# Patient Record
Sex: Male | Born: 1935
Health system: Southern US, Community
[De-identification: ages and names within clinical notes are randomized; demographics above are authoritative.]

## PROBLEM LIST (undated history)

## (undated) DIAGNOSIS — J439 Emphysema, unspecified: Secondary | ICD-10-CM

## (undated) DIAGNOSIS — M1990 Unspecified osteoarthritis, unspecified site: Secondary | ICD-10-CM

## (undated) DIAGNOSIS — R0600 Dyspnea, unspecified: Secondary | ICD-10-CM

## (undated) DIAGNOSIS — C801 Malignant (primary) neoplasm, unspecified: Secondary | ICD-10-CM

## (undated) DIAGNOSIS — I2510 Atherosclerotic heart disease of native coronary artery without angina pectoris: Secondary | ICD-10-CM

## (undated) DIAGNOSIS — C4491 Basal cell carcinoma of skin, unspecified: Secondary | ICD-10-CM

## (undated) DIAGNOSIS — N189 Chronic kidney disease, unspecified: Secondary | ICD-10-CM

## (undated) DIAGNOSIS — F419 Anxiety disorder, unspecified: Secondary | ICD-10-CM

## (undated) HISTORY — PX: TONSILLECTOMY: SUR1361

## (undated) HISTORY — PX: CATARACT EXTRACTION W/ INTRAOCULAR LENS IMPLANT: SHX1309

## (undated) HISTORY — PX: BLADDER SURGERY: SHX569

---

## 1998-07-04 ENCOUNTER — Encounter: Payer: Self-pay | Admitting: Orthopedic Surgery

## 1998-07-04 ENCOUNTER — Inpatient Hospital Stay (HOSPITAL_COMMUNITY): Admission: EM | Admit: 1998-07-04 | Discharge: 1998-07-07 | Payer: Self-pay | Admitting: Family Medicine

## 1998-07-04 ENCOUNTER — Encounter: Payer: Self-pay | Admitting: Emergency Medicine

## 1998-07-16 ENCOUNTER — Encounter: Payer: Self-pay | Admitting: Orthopedic Surgery

## 1998-07-17 ENCOUNTER — Inpatient Hospital Stay (HOSPITAL_COMMUNITY): Admission: RE | Admit: 1998-07-17 | Discharge: 1998-07-19 | Payer: Self-pay | Admitting: Orthopedic Surgery

## 1998-07-17 ENCOUNTER — Encounter: Payer: Self-pay | Admitting: Orthopedic Surgery

## 2001-03-17 ENCOUNTER — Encounter: Payer: Self-pay | Admitting: Orthopaedic Surgery

## 2001-03-17 ENCOUNTER — Ambulatory Visit (HOSPITAL_COMMUNITY): Admission: RE | Admit: 2001-03-17 | Discharge: 2001-03-17 | Payer: Self-pay | Admitting: Orthopaedic Surgery

## 2003-06-07 ENCOUNTER — Ambulatory Visit: Admission: RE | Admit: 2003-06-07 | Discharge: 2003-06-07 | Payer: Self-pay | Admitting: Internal Medicine

## 2005-01-16 ENCOUNTER — Emergency Department (HOSPITAL_COMMUNITY): Admission: AD | Admit: 2005-01-16 | Discharge: 2005-01-16 | Payer: Self-pay | Admitting: Family Medicine

## 2006-09-14 ENCOUNTER — Ambulatory Visit: Payer: Self-pay | Admitting: Gastroenterology

## 2006-09-28 ENCOUNTER — Encounter: Payer: Self-pay | Admitting: Gastroenterology

## 2006-09-28 ENCOUNTER — Ambulatory Visit: Payer: Self-pay | Admitting: Gastroenterology

## 2007-11-11 ENCOUNTER — Emergency Department (HOSPITAL_COMMUNITY): Admission: EM | Admit: 2007-11-11 | Discharge: 2007-11-11 | Payer: Self-pay | Admitting: Emergency Medicine

## 2011-03-23 DIAGNOSIS — S51809A Unspecified open wound of unspecified forearm, initial encounter: Secondary | ICD-10-CM | POA: Diagnosis not present

## 2011-03-30 DIAGNOSIS — I10 Essential (primary) hypertension: Secondary | ICD-10-CM | POA: Diagnosis not present

## 2011-03-30 DIAGNOSIS — E785 Hyperlipidemia, unspecified: Secondary | ICD-10-CM | POA: Diagnosis not present

## 2011-03-30 DIAGNOSIS — Z125 Encounter for screening for malignant neoplasm of prostate: Secondary | ICD-10-CM | POA: Diagnosis not present

## 2011-04-06 DIAGNOSIS — D126 Benign neoplasm of colon, unspecified: Secondary | ICD-10-CM | POA: Diagnosis not present

## 2011-04-06 DIAGNOSIS — M1990 Unspecified osteoarthritis, unspecified site: Secondary | ICD-10-CM | POA: Diagnosis not present

## 2011-04-06 DIAGNOSIS — Z1212 Encounter for screening for malignant neoplasm of rectum: Secondary | ICD-10-CM | POA: Diagnosis not present

## 2011-04-06 DIAGNOSIS — Z0000 Encounter for general adult medical examination without abnormal findings: Secondary | ICD-10-CM | POA: Diagnosis not present

## 2011-04-06 DIAGNOSIS — K5730 Diverticulosis of large intestine without perforation or abscess without bleeding: Secondary | ICD-10-CM | POA: Diagnosis not present

## 2011-05-11 DIAGNOSIS — Z8551 Personal history of malignant neoplasm of bladder: Secondary | ICD-10-CM | POA: Diagnosis not present

## 2011-05-18 DIAGNOSIS — L570 Actinic keratosis: Secondary | ICD-10-CM | POA: Diagnosis not present

## 2011-05-18 DIAGNOSIS — Z85828 Personal history of other malignant neoplasm of skin: Secondary | ICD-10-CM | POA: Diagnosis not present

## 2011-06-15 DIAGNOSIS — H2510 Age-related nuclear cataract, unspecified eye: Secondary | ICD-10-CM | POA: Diagnosis not present

## 2011-06-15 DIAGNOSIS — H52209 Unspecified astigmatism, unspecified eye: Secondary | ICD-10-CM | POA: Diagnosis not present

## 2011-06-24 DIAGNOSIS — H2510 Age-related nuclear cataract, unspecified eye: Secondary | ICD-10-CM | POA: Diagnosis not present

## 2011-06-24 DIAGNOSIS — IMO0002 Reserved for concepts with insufficient information to code with codable children: Secondary | ICD-10-CM | POA: Diagnosis not present

## 2011-07-15 DIAGNOSIS — H2510 Age-related nuclear cataract, unspecified eye: Secondary | ICD-10-CM | POA: Diagnosis not present

## 2011-07-15 DIAGNOSIS — IMO0002 Reserved for concepts with insufficient information to code with codable children: Secondary | ICD-10-CM | POA: Diagnosis not present

## 2011-10-25 DIAGNOSIS — Z23 Encounter for immunization: Secondary | ICD-10-CM | POA: Diagnosis not present

## 2011-11-17 DIAGNOSIS — L570 Actinic keratosis: Secondary | ICD-10-CM | POA: Diagnosis not present

## 2011-11-17 DIAGNOSIS — D485 Neoplasm of uncertain behavior of skin: Secondary | ICD-10-CM | POA: Diagnosis not present

## 2012-03-31 DIAGNOSIS — E785 Hyperlipidemia, unspecified: Secondary | ICD-10-CM | POA: Diagnosis not present

## 2012-03-31 DIAGNOSIS — I10 Essential (primary) hypertension: Secondary | ICD-10-CM | POA: Diagnosis not present

## 2012-04-07 DIAGNOSIS — Z0000 Encounter for general adult medical examination without abnormal findings: Secondary | ICD-10-CM | POA: Diagnosis not present

## 2012-04-07 DIAGNOSIS — Z1331 Encounter for screening for depression: Secondary | ICD-10-CM | POA: Diagnosis not present

## 2012-04-07 DIAGNOSIS — J449 Chronic obstructive pulmonary disease, unspecified: Secondary | ICD-10-CM | POA: Diagnosis not present

## 2012-04-07 DIAGNOSIS — K5730 Diverticulosis of large intestine without perforation or abscess without bleeding: Secondary | ICD-10-CM | POA: Diagnosis not present

## 2012-04-12 DIAGNOSIS — Z1212 Encounter for screening for malignant neoplasm of rectum: Secondary | ICD-10-CM | POA: Diagnosis not present

## 2012-05-19 ENCOUNTER — Encounter: Payer: Self-pay | Admitting: Gastroenterology

## 2012-07-20 DIAGNOSIS — I10 Essential (primary) hypertension: Secondary | ICD-10-CM | POA: Diagnosis not present

## 2012-07-20 DIAGNOSIS — J449 Chronic obstructive pulmonary disease, unspecified: Secondary | ICD-10-CM | POA: Diagnosis not present

## 2012-07-20 DIAGNOSIS — Z6826 Body mass index (BMI) 26.0-26.9, adult: Secondary | ICD-10-CM | POA: Diagnosis not present

## 2012-07-20 DIAGNOSIS — R0609 Other forms of dyspnea: Secondary | ICD-10-CM | POA: Diagnosis not present

## 2012-07-20 DIAGNOSIS — R0989 Other specified symptoms and signs involving the circulatory and respiratory systems: Secondary | ICD-10-CM | POA: Diagnosis not present

## 2012-09-13 DIAGNOSIS — H35379 Puckering of macula, unspecified eye: Secondary | ICD-10-CM | POA: Diagnosis not present

## 2012-09-13 DIAGNOSIS — Z961 Presence of intraocular lens: Secondary | ICD-10-CM | POA: Diagnosis not present

## 2012-09-13 DIAGNOSIS — H52209 Unspecified astigmatism, unspecified eye: Secondary | ICD-10-CM | POA: Diagnosis not present

## 2012-10-11 DIAGNOSIS — Z23 Encounter for immunization: Secondary | ICD-10-CM | POA: Diagnosis not present

## 2012-10-20 DIAGNOSIS — R0609 Other forms of dyspnea: Secondary | ICD-10-CM | POA: Diagnosis not present

## 2012-10-20 DIAGNOSIS — Z6827 Body mass index (BMI) 27.0-27.9, adult: Secondary | ICD-10-CM | POA: Diagnosis not present

## 2012-10-20 DIAGNOSIS — I10 Essential (primary) hypertension: Secondary | ICD-10-CM | POA: Diagnosis not present

## 2012-11-20 DIAGNOSIS — Z85828 Personal history of other malignant neoplasm of skin: Secondary | ICD-10-CM | POA: Diagnosis not present

## 2012-11-20 DIAGNOSIS — L570 Actinic keratosis: Secondary | ICD-10-CM | POA: Diagnosis not present

## 2013-02-14 DIAGNOSIS — R0989 Other specified symptoms and signs involving the circulatory and respiratory systems: Secondary | ICD-10-CM | POA: Diagnosis not present

## 2013-02-14 DIAGNOSIS — J209 Acute bronchitis, unspecified: Secondary | ICD-10-CM | POA: Diagnosis not present

## 2013-02-14 DIAGNOSIS — R0609 Other forms of dyspnea: Secondary | ICD-10-CM | POA: Diagnosis not present

## 2013-04-04 DIAGNOSIS — E785 Hyperlipidemia, unspecified: Secondary | ICD-10-CM | POA: Diagnosis not present

## 2013-04-04 DIAGNOSIS — Z125 Encounter for screening for malignant neoplasm of prostate: Secondary | ICD-10-CM | POA: Diagnosis not present

## 2013-04-04 DIAGNOSIS — I10 Essential (primary) hypertension: Secondary | ICD-10-CM | POA: Diagnosis not present

## 2013-04-13 DIAGNOSIS — Z0000 Encounter for general adult medical examination without abnormal findings: Secondary | ICD-10-CM | POA: Diagnosis not present

## 2013-04-13 DIAGNOSIS — I10 Essential (primary) hypertension: Secondary | ICD-10-CM | POA: Diagnosis not present

## 2013-04-13 DIAGNOSIS — K5730 Diverticulosis of large intestine without perforation or abscess without bleeding: Secondary | ICD-10-CM | POA: Diagnosis not present

## 2013-04-13 DIAGNOSIS — J449 Chronic obstructive pulmonary disease, unspecified: Secondary | ICD-10-CM | POA: Diagnosis not present

## 2013-04-13 DIAGNOSIS — E785 Hyperlipidemia, unspecified: Secondary | ICD-10-CM | POA: Diagnosis not present

## 2013-04-13 DIAGNOSIS — J301 Allergic rhinitis due to pollen: Secondary | ICD-10-CM | POA: Diagnosis not present

## 2013-04-13 DIAGNOSIS — Z1331 Encounter for screening for depression: Secondary | ICD-10-CM | POA: Diagnosis not present

## 2013-04-13 DIAGNOSIS — M1990 Unspecified osteoarthritis, unspecified site: Secondary | ICD-10-CM | POA: Diagnosis not present

## 2013-04-13 DIAGNOSIS — D126 Benign neoplasm of colon, unspecified: Secondary | ICD-10-CM | POA: Diagnosis not present

## 2013-04-16 DIAGNOSIS — Z1212 Encounter for screening for malignant neoplasm of rectum: Secondary | ICD-10-CM | POA: Diagnosis not present

## 2013-09-17 DIAGNOSIS — Z961 Presence of intraocular lens: Secondary | ICD-10-CM | POA: Diagnosis not present

## 2013-09-17 DIAGNOSIS — H52209 Unspecified astigmatism, unspecified eye: Secondary | ICD-10-CM | POA: Diagnosis not present

## 2013-10-14 DIAGNOSIS — Z23 Encounter for immunization: Secondary | ICD-10-CM | POA: Diagnosis not present

## 2013-10-16 DIAGNOSIS — J449 Chronic obstructive pulmonary disease, unspecified: Secondary | ICD-10-CM | POA: Diagnosis not present

## 2013-10-16 DIAGNOSIS — Z1331 Encounter for screening for depression: Secondary | ICD-10-CM | POA: Diagnosis not present

## 2013-10-16 DIAGNOSIS — I10 Essential (primary) hypertension: Secondary | ICD-10-CM | POA: Diagnosis not present

## 2013-10-16 DIAGNOSIS — E785 Hyperlipidemia, unspecified: Secondary | ICD-10-CM | POA: Diagnosis not present

## 2013-10-16 DIAGNOSIS — J301 Allergic rhinitis due to pollen: Secondary | ICD-10-CM | POA: Diagnosis not present

## 2013-12-25 ENCOUNTER — Other Ambulatory Visit: Payer: Self-pay | Admitting: Dermatology

## 2013-12-25 DIAGNOSIS — C44319 Basal cell carcinoma of skin of other parts of face: Secondary | ICD-10-CM | POA: Diagnosis not present

## 2013-12-25 DIAGNOSIS — L570 Actinic keratosis: Secondary | ICD-10-CM | POA: Diagnosis not present

## 2014-01-30 ENCOUNTER — Encounter (HOSPITAL_COMMUNITY)
Admission: EM | Disposition: A | Discharge status: Home / Self Care | Payer: Self-pay | Source: Home / Self Care | Attending: Internal Medicine

## 2014-01-30 ENCOUNTER — Inpatient Hospital Stay (HOSPITAL_COMMUNITY): Payer: Medicare Other | Admitting: Registered Nurse

## 2014-01-30 ENCOUNTER — Inpatient Hospital Stay (HOSPITAL_COMMUNITY)
Admission: EM | Admit: 2014-01-30 | Discharge: 2014-01-31 | DRG: 378 | Disposition: A | Discharge status: Home / Self Care | Payer: Medicare Other | Attending: Internal Medicine | Admitting: Internal Medicine

## 2014-01-30 ENCOUNTER — Encounter (HOSPITAL_COMMUNITY): Payer: Self-pay | Admitting: Emergency Medicine

## 2014-01-30 DIAGNOSIS — K922 Gastrointestinal hemorrhage, unspecified: Secondary | ICD-10-CM

## 2014-01-30 DIAGNOSIS — E785 Hyperlipidemia, unspecified: Secondary | ICD-10-CM

## 2014-01-30 DIAGNOSIS — D649 Anemia, unspecified: Secondary | ICD-10-CM | POA: Diagnosis not present

## 2014-01-30 DIAGNOSIS — Z87891 Personal history of nicotine dependence: Secondary | ICD-10-CM | POA: Diagnosis not present

## 2014-01-30 DIAGNOSIS — I10 Essential (primary) hypertension: Secondary | ICD-10-CM | POA: Diagnosis present

## 2014-01-30 DIAGNOSIS — K921 Melena: Secondary | ICD-10-CM

## 2014-01-30 DIAGNOSIS — K2961 Other gastritis with bleeding: Secondary | ICD-10-CM

## 2014-01-30 DIAGNOSIS — N179 Acute kidney failure, unspecified: Secondary | ICD-10-CM | POA: Diagnosis present

## 2014-01-30 DIAGNOSIS — D62 Acute posthemorrhagic anemia: Secondary | ICD-10-CM | POA: Diagnosis present

## 2014-01-30 DIAGNOSIS — K260 Acute duodenal ulcer with hemorrhage: Secondary | ICD-10-CM

## 2014-01-30 DIAGNOSIS — Z7982 Long term (current) use of aspirin: Secondary | ICD-10-CM | POA: Diagnosis not present

## 2014-01-30 DIAGNOSIS — Z8551 Personal history of malignant neoplasm of bladder: Secondary | ICD-10-CM

## 2014-01-30 DIAGNOSIS — K2980 Duodenitis without bleeding: Secondary | ICD-10-CM | POA: Diagnosis not present

## 2014-01-30 DIAGNOSIS — D72829 Elevated white blood cell count, unspecified: Secondary | ICD-10-CM

## 2014-01-30 DIAGNOSIS — R109 Unspecified abdominal pain: Secondary | ICD-10-CM | POA: Diagnosis not present

## 2014-01-30 DIAGNOSIS — D6489 Other specified anemias: Secondary | ICD-10-CM

## 2014-01-30 HISTORY — DX: Gastrointestinal hemorrhage, unspecified: K92.2

## 2014-01-30 HISTORY — DX: Acute duodenal ulcer with hemorrhage: K26.0

## 2014-01-30 HISTORY — DX: Acute posthemorrhagic anemia: D62

## 2014-01-30 HISTORY — PX: ESOPHAGOGASTRODUODENOSCOPY: SHX5428

## 2014-01-30 HISTORY — DX: Emphysema, unspecified: J43.9

## 2014-01-30 HISTORY — DX: Malignant (primary) neoplasm, unspecified: C80.1

## 2014-01-30 LAB — CBC
HEMATOCRIT: 29.4 % — AB (ref 39.0–52.0)
Hemoglobin: 9.6 g/dL — ABNORMAL LOW (ref 13.0–17.0)
MCH: 30.2 pg (ref 26.0–34.0)
MCHC: 32.7 g/dL (ref 30.0–36.0)
MCV: 92.5 fL (ref 78.0–100.0)
PLATELETS: 257 10*3/uL (ref 150–400)
RBC: 3.18 MIL/uL — ABNORMAL LOW (ref 4.22–5.81)
RDW: 13.4 % (ref 11.5–15.5)
WBC: 13.8 10*3/uL — ABNORMAL HIGH (ref 4.0–10.5)

## 2014-01-30 LAB — COMPREHENSIVE METABOLIC PANEL
ALBUMIN: 3.9 g/dL (ref 3.5–5.2)
ALK PHOS: 27 U/L — AB (ref 39–117)
ALT: 14 U/L (ref 0–53)
AST: 22 U/L (ref 0–37)
Anion gap: 10 (ref 5–15)
BUN: 58 mg/dL — ABNORMAL HIGH (ref 6–23)
CALCIUM: 8.9 mg/dL (ref 8.4–10.5)
CO2: 22 mmol/L (ref 19–32)
CREATININE: 1.63 mg/dL — AB (ref 0.50–1.35)
Chloride: 105 mEq/L (ref 96–112)
GFR calc Af Amer: 43 mL/min — ABNORMAL LOW (ref >90)
GFR, EST NON AFRICAN AMERICAN: 37 mL/min — AB (ref >90)
Glucose, Bld: 131 mg/dL — ABNORMAL HIGH (ref 70–99)
Potassium: 4.1 mmol/L (ref 3.5–5.1)
Sodium: 137 mmol/L (ref 135–145)
Total Bilirubin: 0.6 mg/dL (ref 0.3–1.2)
Total Protein: 6.2 g/dL (ref 6.0–8.3)

## 2014-01-30 LAB — ABO/RH: ABO/RH(D): A POS

## 2014-01-30 LAB — I-STAT TROPONIN, ED: Troponin i, poc: 0 ng/mL (ref 0.00–0.08)

## 2014-01-30 LAB — I-STAT CG4 LACTIC ACID, ED: Lactic Acid, Venous: 2.5 mmol/L — ABNORMAL HIGH (ref 0.5–2.2)

## 2014-01-30 LAB — POC OCCULT BLOOD, ED: Fecal Occult Bld: POSITIVE — AB

## 2014-01-30 SURGERY — EGD (ESOPHAGOGASTRODUODENOSCOPY)
Anesthesia: Moderate Sedation

## 2014-01-30 SURGERY — ESOPHAGOGASTRODUODENOSCOPY (EGD) WITH PROPOFOL
Anesthesia: Monitor Anesthesia Care

## 2014-01-30 MED ORDER — MIDAZOLAM HCL 10 MG/2ML IJ SOLN
INTRAMUSCULAR | Status: DC | PRN
  Administered 2014-01-30 (×2): 2 mg via INTRAVENOUS
  Administered 2014-01-30: 1 mg via INTRAVENOUS

## 2014-01-30 MED ORDER — ACETAMINOPHEN 325 MG PO TABS: 650.0000 mg | ORAL_TABLET | Freq: Four times a day (QID) | ORAL | Status: DC | PRN

## 2014-01-30 MED ORDER — ADULT MULTIVITAMIN W/MINERALS CH
1.0000 | ORAL_TABLET | Freq: Every day | ORAL | Status: DC
  Administered 2014-01-31: 1 via ORAL
  Filled 2014-01-30 (×2): qty 1

## 2014-01-30 MED ORDER — ACETAMINOPHEN 650 MG RE SUPP: 650.0000 mg | Freq: Four times a day (QID) | RECTAL | Status: DC | PRN

## 2014-01-30 MED ORDER — AMLODIPINE BESYLATE 5 MG PO TABS
5.0000 mg | ORAL_TABLET | Freq: Every day | ORAL | Status: DC
Start: 2014-01-31 — End: 2014-01-31
  Administered 2014-01-31: 5 mg via ORAL
  Filled 2014-01-30 (×2): qty 1

## 2014-01-30 MED ORDER — ATORVASTATIN CALCIUM 20 MG PO TABS
20.0000 mg | ORAL_TABLET | Freq: Every day | ORAL | Status: DC
  Administered 2014-01-30: 20 mg via ORAL
  Filled 2014-01-30 (×2): qty 1

## 2014-01-30 MED ORDER — ALBUTEROL SULFATE (2.5 MG/3ML) 0.083% IN NEBU
3.0000 mL | INHALATION_SOLUTION | Freq: Four times a day (QID) | RESPIRATORY_TRACT | Status: DC | PRN
Start: 2014-01-30 — End: 2014-01-31

## 2014-01-30 MED ORDER — SODIUM CHLORIDE 0.9 % IJ SOLN: 3.0000 mL | Freq: Two times a day (BID) | INTRAMUSCULAR | Status: DC

## 2014-01-30 MED ORDER — PROPOFOL 10 MG/ML IV BOLUS
INTRAVENOUS | Status: AC
  Filled 2014-01-30: qty 20

## 2014-01-30 MED ORDER — FENTANYL CITRATE 0.05 MG/ML IJ SOLN
INTRAMUSCULAR | Status: DC | PRN
  Administered 2014-01-30: 12.5 ug via INTRAVENOUS
  Administered 2014-01-30 (×2): 25 ug via INTRAVENOUS

## 2014-01-30 MED ORDER — ONDANSETRON HCL 4 MG PO TABS: 4.0000 mg | ORAL_TABLET | Freq: Four times a day (QID) | ORAL | Status: DC | PRN

## 2014-01-30 MED ORDER — ONDANSETRON HCL 4 MG/2ML IJ SOLN: 4.0000 mg | Freq: Four times a day (QID) | INTRAMUSCULAR | Status: DC | PRN

## 2014-01-30 MED ORDER — SODIUM CHLORIDE 0.9 % IV SOLN
INTRAVENOUS | Status: DC
  Administered 2014-01-30: 18:00:00 via INTRAVENOUS

## 2014-01-30 MED ORDER — SIMVASTATIN 40 MG PO TABS
40.0000 mg | ORAL_TABLET | Freq: Every day | ORAL | Status: DC
  Filled 2014-01-30: qty 1

## 2014-01-30 MED ORDER — BUTAMBEN-TETRACAINE-BENZOCAINE 2-2-14 % EX AERO
INHALATION_SPRAY | CUTANEOUS | Status: DC | PRN
  Administered 2014-01-30: 2 via TOPICAL

## 2014-01-30 MED ORDER — LACTATED RINGERS IV SOLN
INTRAVENOUS | Status: DC
  Administered 2014-01-30: 1000 mL via INTRAVENOUS

## 2014-01-30 MED ORDER — LORATADINE 10 MG PO TABS
10.0000 mg | ORAL_TABLET | Freq: Every day | ORAL | Status: DC
  Administered 2014-01-31: 10 mg via ORAL
  Filled 2014-01-30: qty 1

## 2014-01-30 MED ORDER — PANTOPRAZOLE SODIUM 40 MG IV SOLR
40.0000 mg | Freq: Two times a day (BID) | INTRAVENOUS | Status: DC
  Administered 2014-01-30 – 2014-01-31 (×3): 40 mg via INTRAVENOUS
  Filled 2014-01-30 (×4): qty 40

## 2014-01-30 MED ORDER — MIDAZOLAM HCL 10 MG/2ML IJ SOLN
INTRAMUSCULAR | Status: AC
  Filled 2014-01-30: qty 2

## 2014-01-30 MED ORDER — DIPHENHYDRAMINE HCL 50 MG/ML IJ SOLN
INTRAMUSCULAR | Status: AC
  Filled 2014-01-30: qty 1

## 2014-01-30 MED ORDER — FENTANYL CITRATE 0.05 MG/ML IJ SOLN
INTRAMUSCULAR | Status: AC
  Filled 2014-01-30: qty 2

## 2014-01-30 SURGICAL SUPPLY — 15 items

## 2014-01-30 NOTE — Consult Note (Signed)
Referring Provider: Dr. Maryan Rued Primary Care Physician:  Tivis Ringer, MD Primary Gastroenterologist:  Dr. Fuller Plan   Reason for Consultation:  UGIB  HPI: Gerald Valdez is a 80 y.o. male who has PMH of HTN, HLD, bladder cancer.  He presented to [facility] hospital this morning with complaints of black, tarry stools that began the day prior to hospital visit.  He says that he noticed the black stools yesterday, had 3 or 4 BM's.  Denies any abdominal pain, nausea, or vomiting.  No dizziness but just feels tired.  Takes baby ASA daily but no other OTC NSAID's or anticoagulation.  Never had similar symptoms in the past.  In the ED he was found to have a Hgb of 9.6 grams with no previous labs for comparison.  EDP reports very black, strongly hemoccult positive stools on DRE.  Cr slightly elevated at 1.63.  WBC count slightly elevated as well at 13.8.  Protonix 40 mg IV BID has been ordered.    Last colonoscopy was in 09/2006 by Dr. Fuller Plan at which time he was found to have diverticulosis, internal hemorrhoids, a non-bleeding cecal AVM, and one polyp that was removed and was a tubular adenoma on pathology.   Past Medical History  Diagnosis Date  . Emphysema of lung   . Cancer     bladder    Past Surgical History  Procedure Laterality Date  . Bladder surgery      Prior to Admission medications   Medication Sig Start Date End Date Taking? Authorizing Provider  albuterol (PROVENTIL HFA;VENTOLIN HFA) 108 (90 BASE) MCG/ACT inhaler Inhale 2 puffs into the lungs every 6 (six) hours as needed for wheezing or shortness of breath.   Yes Historical Provider, MD  amLODipine (NORVASC) 5 MG tablet Take 5 mg by mouth daily with breakfast.   Yes Historical Provider, MD  aspirin EC 81 MG tablet Take 81 mg by mouth at bedtime.   Yes Historical Provider, MD  loratadine (CLARITIN) 10 MG tablet Take 10 mg by mouth daily.   Yes Historical Provider, MD  Multiple Vitamin (MULTIVITAMIN WITH MINERALS) TABS tablet Take 1  tablet by mouth daily with breakfast.   Yes Historical Provider, MD  ramipril (ALTACE) 10 MG capsule Take 10 mg by mouth daily with breakfast.   Yes Historical Provider, MD  simvastatin (ZOCOR) 40 MG tablet Take 40 mg by mouth at bedtime.   Yes Historical Provider, MD    No current facility-administered medications for this encounter.   Current Outpatient Prescriptions  Medication Sig Dispense Refill  . albuterol (PROVENTIL HFA;VENTOLIN HFA) 108 (90 BASE) MCG/ACT inhaler Inhale 2 puffs into the lungs every 6 (six) hours as needed for wheezing or shortness of breath.    Marland Kitchen amLODipine (NORVASC) 5 MG tablet Take 5 mg by mouth daily with breakfast.    . aspirin EC 81 MG tablet Take 81 mg by mouth at bedtime.    Marland Kitchen loratadine (CLARITIN) 10 MG tablet Take 10 mg by mouth daily.    . Multiple Vitamin (MULTIVITAMIN WITH MINERALS) TABS tablet Take 1 tablet by mouth daily with breakfast.    . ramipril (ALTACE) 10 MG capsule Take 10 mg by mouth daily with breakfast.    . simvastatin (ZOCOR) 40 MG tablet Take 40 mg by mouth at bedtime.      Allergies as of 01/30/2014  . (No Known Allergies)    No family history on file.  History   Social History  . Marital Status: Married  Spouse Name: N/A    Number of Children: N/A  . Years of Education: N/A   Occupational History  . Not on file.   Social History Main Topics  . Smoking status: Former Research scientist (life sciences)  . Smokeless tobacco: Not on file  . Alcohol Use: Not on file  . Drug Use: Not on file  . Sexual Activity: Not on file   Other Topics Concern  . Not on file   Social History Narrative  . No narrative on file    Review of Systems: Ten point ROS is O/W negative except as mentioned in HPI.  Physical Exam: Vital signs in last 24 hours: Temp:  [97.6 F (36.4 C)] 97.6 F (36.4 C) (12/23 1022) Pulse Rate:  [94-109] 94 (12/23 1142) Resp:  [20] 20 (12/23 1142) BP: (121-137)/(51) 121/51 mmHg (12/23 1142) SpO2:  [98 %-100 %] 98 % (12/23  1142)   General:  Alert,  Well-developed, well-nourished, pleasant and cooperative in NAD Head:  Normocephalic and atraumatic. Eyes:  Sclera clear, no icterus.  Conjunctiva pink. Ears:  Normal auditory acuity. Mouth:  No deformity or lesions.   Lungs:  Clear throughout to auscultation.  No wheezes, crackles, or rhonchi.  Heart:  Regular rate and rhythm; no murmurs, clicks, rubs, or gallops. Abdomen:  Soft, non-distended.  BS present.  Non-tender.   Rectal:  Deferred.  Had black, strongly hemoccult positive stools per EDP.   Msk:  Symmetrical without gross deformities. Pulses:  Normal pulses noted. Extremities:  Without clubbing or edema. Neurologic:  Alert and  oriented x4;  grossly normal neurologically. Skin:  Intact without significant lesions or rashes. Psych:  Alert and cooperative. Normal mood and affect.  Lab Results:  Recent Labs  01/30/14 1038  WBC 13.8*  HGB 9.6*  HCT 29.4*  PLT 257   BMET  Recent Labs  01/30/14 1038  NA 137  K 4.1  CL 105  CO2 22  GLUCOSE 131*  BUN 58*  CREATININE 1.63*  CALCIUM 8.9   LFT  Recent Labs  01/30/14 1038  PROT 6.2  ALBUMIN 3.9  AST 22  ALT 14  ALKPHOS 27*  BILITOT 0.6   IMPRESSION:  -Melena/black, heme positive stools:  Rule out UGI bleeding.  Has history of non-bleeding cecal AVM on colonoscopy in 2008.  PLAN: -EGD later today. -Protonix 40 mg IV BID. -If EGD is negative then may need repeat colonoscopy. -Monitor Hgb and transfuse if needed.  ZEHR, JESSICA D.  01/30/2014, 11:44 AM  Pager number [phone]  GI Attending Note   Chart was reviewed and patient was examined. X-rays and lab were reviewed.    I agree with management and plans. Suspect active PUD >> UGI neoplasm, AVMs.  Plans as above.  Sandy Salaam. Deatra Ina, M.D., [facility] Cell [phone]

## 2014-01-30 NOTE — Op Note (Signed)
[facility] [place], [address]   ENDOSCOPY PROCEDURE REPORT  PATIENT: Gerald Valdez, Gerald Valdez  MR#: [id number] BIRTHDATE: 1928/10/06 , 85  yrs. old GENDER: male ENDOSCOPIST: Inda Castle, MD REFERRED BY:  Prince Solian, M.D. PROCEDURE DATE:  01/30/2014 PROCEDURE:  EGD w/ biopsy ASA CLASS:     Class II INDICATIONS:  melena. MEDICATIONS: Versed 5 mg IV and Fentanyl 62.5 mcg IV TOPICAL ANESTHETIC: Cetacaine Spray  DESCRIPTION OF PROCEDURE: After the risks benefits and alternatives of the procedure were thoroughly explained, informed consent was obtained.  The Pentax Gastroscope [id number] endoscope was introduced through the mouth and advanced to the second portion of the duodenum , Without limitations.  The instrument was slowly withdrawn as the mucosa was fully examined.    DUODENUM: Non-bleeding ulcer(s) were found.   Except for the findings listed the EGD was otherwise normal. At the apex of the bulb there was marked inflammatory changes including edematous folds and erythematous folds.  There were at least 2 superficial linear ulcers measuring approximate 1 cm with a clean base. Biopsies surrounding the ulcer were taken (2) there was no fresh or old blood in the upper GI tract. Retroflexed views revealed no abnormalities.     The scope was then withdrawn from the patient and the procedure completed.  COMPLICATIONS: There were no immediate complications.  ENDOSCOPIC IMPRESSION: 1.   Ulcer(s) were found 2.   EGD was otherwise normal  RECOMMENDATIONS: twice a day PPI therapy Avoid NSAIDs  REPEAT EXAM:  eSigned:  Inda Castle, MD 01/30/2014 5:03 PM    CC:

## 2014-01-30 NOTE — Anesthesia Preprocedure Evaluation (Signed)
Anesthesia Evaluation  Patient identified by MRN, date of birth, ID band Patient awake    Reviewed: Allergy & Precautions, H&P , NPO status , Patient's Chart, lab work & pertinent test results  Airway Mallampati: II  TM Distance: >3 FB Neck ROM: full    Dental no notable dental hx.    Pulmonary neg pulmonary ROS, COPD COPD inhaler, former smoker,  breath sounds clear to auscultation  Pulmonary exam normal       Cardiovascular Exercise Tolerance: Good negative cardio ROS  Rhythm:regular Rate:Normal     Neuro/Psych negative neurological ROS  negative psych ROS   GI/Hepatic negative GI ROS, Neg liver ROS,   Endo/Other  negative endocrine ROS  Renal/GU negative Renal ROS  negative genitourinary   Musculoskeletal   Abdominal   Peds  Hematology negative hematology ROS (+)   Anesthesia Other Findings   Reproductive/Obstetrics negative OB ROS                             Anesthesia Physical Anesthesia Plan  ASA: III  Anesthesia Plan: MAC   Post-op Pain Management:    Induction:   Airway Management Planned:   Additional Equipment:   Intra-op Plan:   Post-operative Plan:   Informed Consent: I have reviewed the patients History and Physical, chart, labs and discussed the procedure including the risks, benefits and alternatives for the proposed anesthesia with the patient or authorized representative who has indicated his/her understanding and acceptance.   Dental Advisory Given  Plan Discussed with: CRNA and Surgeon  Anesthesia Plan Comments:         Anesthesia Quick Evaluation

## 2014-01-30 NOTE — Progress Notes (Signed)
The order for simvastatin(Zocor) was changed to an equivalent dose of atorvastatin(Lipitor) due to the potential drug interaction with Norvasc.  When taken in combination with medications that inhibit its metabolism, simvastatin can accumulate which increases the risk of liver toxicity, myopathy, or rhabdomyolysis.  Simvastatin dose should not exceed 10mg /day in patients taking verapamil, diltiazem, fibrates, or niacin >or= 1g/day.   Simvastatin dose should not exceed 20mg /day in patients taking amlodipine, ranolazine or amiodarone.   Please consider this potential interaction at discharge.  Dorrene German 01/30/2014 7:01 PM

## 2014-01-30 NOTE — Progress Notes (Signed)
See EGD note.  He has several ulcers in the duodenum.  There is no fresh or old blood.  Recommendations #1 twice a day PPI therapy #2 regular diet  If stable can be discharged tomorrow

## 2014-01-30 NOTE — ED Provider Notes (Signed)
CSN: [id number]     Arrival date & time 01/30/14  1007 History   First MD Initiated Contact with Patient 01/30/14 1018     Chief Complaint  Patient presents with  . Melena  . Abdominal Pain  . Fatigue     (Consider location/radiation/quality/duration/timing/severity/associated sxs/prior Treatment) Patient is a 80 y.o. male presenting with hematochezia. The history is provided by the patient and the spouse.  Rectal Bleeding Quality:  Black and tarry Amount:  Moderate Duration:  2 days Timing:  Intermittent Progression:  Unchanged Chronicity:  New Context: defecation   Context: not rectal pain   Similar prior episodes: no   Relieved by:  Nothing Worsened by:  Nothing tried Ineffective treatments:  None tried Associated symptoms: no dizziness, no fever, no light-headedness, no loss of consciousness and no vomiting   Associated symptoms comment:  States abdomen is a little sore but no acute pain.  Fatigue but no SOB or chest pain.  Decreased appetite. Risk factors: hx of colorectal surgery   Risk factors: no anticoagulant use, no hx of colorectal cancer, no liver disease, no NSAID use and no steroid use     Past Medical History  Diagnosis Date  . Emphysema of lung   . Cancer     bladder   Past Surgical History  Procedure Laterality Date  . Bladder surgery     No family history on file. History  Substance Use Topics  . Smoking status: Former Research scientist (life sciences)  . Smokeless tobacco: Not on file  . Alcohol Use: Not on file    Review of Systems  Constitutional: Positive for appetite change and fatigue. Negative for fever.  Respiratory: Negative for cough and chest tightness.        Chronic mild SOB from COPD but nothing different from baseline  Cardiovascular: Negative for chest pain and leg swelling.  Gastrointestinal: Positive for hematochezia. Negative for vomiting.  Genitourinary: Negative for dysuria.  Neurological: Negative for dizziness, loss of consciousness and  light-headedness.  All other systems reviewed and are negative.     Allergies  Review of patient's allergies indicates no known allergies.  Home Medications   Prior to Admission medications   Medication Sig Start Date End Date Taking? Authorizing Provider  albuterol (PROVENTIL HFA;VENTOLIN HFA) 108 (90 BASE) MCG/ACT inhaler Inhale 2 puffs into the lungs every 6 (six) hours as needed for wheezing or shortness of breath.   Yes Historical Provider, MD  amLODipine (NORVASC) 5 MG tablet Take 5 mg by mouth daily with breakfast.   Yes Historical Provider, MD  aspirin EC 81 MG tablet Take 81 mg by mouth at bedtime.   Yes Historical Provider, MD  loratadine (CLARITIN) 10 MG tablet Take 10 mg by mouth daily.   Yes Historical Provider, MD  Multiple Vitamin (MULTIVITAMIN WITH MINERALS) TABS tablet Take 1 tablet by mouth daily with breakfast.   Yes Historical Provider, MD  ramipril (ALTACE) 10 MG capsule Take 10 mg by mouth daily with breakfast.   Yes Historical Provider, MD  simvastatin (ZOCOR) 40 MG tablet Take 40 mg by mouth at bedtime.   Yes Historical Provider, MD   BP 137/51 mmHg  Pulse 109  Temp(Src) 97.6 F (36.4 C) (Oral)  Resp 20  SpO2 100% Physical Exam  Constitutional: He is oriented to person, place, and time. He appears well-developed and well-nourished. No distress.  HENT:  Head: Normocephalic and atraumatic.  Mouth/Throat: Oropharynx is clear and moist.  Eyes: Conjunctivae and EOM are normal. Pupils are equal, round,  and reactive to light.  Neck: Normal range of motion. Neck supple.  Cardiovascular: Regular rhythm and intact distal pulses.  Tachycardia present.   No murmur heard. Pulmonary/Chest: Effort normal and breath sounds normal. No respiratory distress. He has no wheezes. He has no rales.  Abdominal: Soft. He exhibits no distension. There is no tenderness. There is no rebound and no guarding.  Genitourinary: Guaiac positive stool.  Black tarry stool  Musculoskeletal:  Normal range of motion. He exhibits no edema or tenderness.  Neurological: He is alert and oriented to person, place, and time.  Skin: Skin is warm and dry. No rash noted. No erythema. There is pallor.  Psychiatric: He has a normal mood and affect. His behavior is normal.  Nursing note and vitals reviewed.   ED Course  Procedures (including critical care time) Labs Review Labs Reviewed  CBC - Abnormal; Notable for the following:    WBC 13.8 (*)    RBC 3.18 (*)    Hemoglobin 9.6 (*)    HCT 29.4 (*)    All other components within normal limits  POC OCCULT BLOOD, ED - Abnormal; Notable for the following:    Fecal Occult Bld POSITIVE (*)    All other components within normal limits  I-STAT CG4 LACTIC ACID, ED - Abnormal; Notable for the following:    Lactic Acid, Venous 2.50 (*)    All other components within normal limits  COMPREHENSIVE METABOLIC PANEL  OCCULT BLOOD X 1 CARD TO LAB, STOOL  I-STAT TROPOININ, ED  TYPE AND SCREEN  ABO/RH    Imaging Review No results found.   EKG Interpretation   Date/Time:  Wednesday January 30 2014 10:25:07 EST Ventricular Rate:  99 PR Interval:  154 QRS Duration: 77 QT Interval:  340 QTC Calculation: 436 R Axis:   76 Text Interpretation:  Sinus rhythm Repol abnrm suggests ischemia,  anterolateral , new ST depression in inferolateral leads Confirmed by  Maryan Rued  MD, Loree Fee ([id number]) on 01/30/2014 10:41:32 AM      MDM   Final diagnoses:  Gastrointestinal hemorrhage, unspecified gastritis, unspecified gastrointestinal hemorrhage type  Anemia due to other cause    Patient with 48 hours of blacked tarry stool, general fatigue and decreased appetite. He denies any shortness of breath or chest pain. He denies any past history of GI bleeding. He takes it the baby aspirin a day. No NSAIDs or alcohol. Patient has no abdominal pain on exam. He is mildly tachycardic with ST depression and a normal troponin. Initial hemoglobin was 9.6.  Patient's last colonoscopy was less than 5 years ago and at that time he had a small polyp that was removed but no other findings.  Patient is currently stable with mild tachycardia but normal blood pressure. He is talking awake and alert. Patient was typed and screened.  Hemoccult was positive and lactic acid is 2.5. CMP is pending. We'll discuss with [facility] and hospitalist for admission    Blanchie Dessert, MD 01/30/14 2035

## 2014-01-30 NOTE — ED Notes (Addendum)
Pt reports formed tarry stool starting yesterday. Pt denies nausea, vomiting, or diarrhea.

## 2014-01-30 NOTE — ED Notes (Signed)
Pt c/o dark tarry stool that he noticed yesterday, pt states abd pain started this morning and has not eaten anything this morning. Pt also c/o feeling fatigue, denies n/v/d.

## 2014-01-30 NOTE — H&P (Signed)
[facility] History and Physical  Gerald Valdez OEV:[id number] DOB: 04/17/1928 DOA: 01/30/2014  Referring physician: ER physician PCP: Tivis Ringer, MD   Chief Complaint: black stool   HPI:  80 year old male with past medical history of hypertension, dyslipidemia who presented to [facility] ED with reports of black Perry stool for past 2 days prior to this admission. Patient did not report the diarrhea or constipation area to no abdominal pain, nausea or vomiting. Patient does not recall previous similar events. He states aspirin 1 daily basis. No other complaints such as chest pain, shortness of breath or palpitations. No fevers or chills. No lightheadedness or loss of consciousness. In ED, patient is hemodynamically stable with blood pressure 121/51, heart rate 94, afebrile and oxygen saturation 98% on room air. Blood work revealed white blood cell count of 13.8, hemoglobin 9.6, creatinine is 1.63 on admission. Fecal occult blood test was positive. Patient was admitted for further evaluation. The GI will see the patient in consultation.   Assessment & Plan    Active Problems: Upper GI bleed  Triggering agent likely aspirin. Aspirin was put on hold. Unclear etiology of bleeding.  Patient started on IV fluids, Protonix 40 mg IV every 12 hours.  Use antiemetics as needed for nausea or vomiting.  Appreciate the GI seen the patient in consultation.  Essential hypertension  Will be according may resume Norvasc 5 mg daily but hold ramipril because of renal insufficiency.  Acute renal failure  Likely due to ACE inhibitor, ramipril. Ramipril put on hold.  Continue IV fluids started in ED. Follow-up BMP in the morning.  Dyslipidemia  May resume Zocor 40 mg daily  DVT prophylaxis:   SCD's bilaterally   Radiological Exams on Admission: No results found.   Code Status: Full Family Communication: Plan of care discussed with the patient  Disposition Plan: Admit for  further evaluation  Leisa Lenz, MD  [facility] Pager [phone]  Review of Systems:  Constitutional: Negative for fever, chills and malaise/fatigue. Negative for diaphoresis.  HENT: Negative for hearing loss, ear pain, nosebleeds, congestion, sore throat, neck pain, tinnitus and ear discharge.   Eyes: Negative for blurred vision, double vision, photophobia, pain, discharge and redness.  Respiratory: Negative for cough, hemoptysis, sputum production, shortness of breath, wheezing and stridor.   Cardiovascular: Negative for chest pain, palpitations, orthopnea, claudication and leg swelling.  Gastrointestinal: per HPI  Genitourinary: Negative for dysuria, urgency, frequency, hematuria and flank pain.  Musculoskeletal: Negative for myalgias, back pain, joint pain and falls.  Skin: Negative for itching and rash.  Neurological: Negative for dizziness and weakness. Negative for tingling, tremors, sensory change, speech change, focal weakness, loss of consciousness and headaches.  Endo/Heme/Allergies: Negative for environmental allergies and polydipsia. Does not bruise/bleed easily.  Psychiatric/Behavioral: Negative for suicidal ideas. The patient is not nervous/anxious.      Past Medical History  Diagnosis Date  . Emphysema of lung   . Cancer     bladder   Past Surgical History  Procedure Laterality Date  . Bladder surgery     Social History:  reports that he has quit smoking. He does not have any smokeless tobacco history on file. His alcohol and drug histories are not on file.  No Known Allergies  Family History: No family history on file.   Prior to Admission medications   Medication Sig Start Date End Date Taking? Authorizing Provider  albuterol (PROVENTIL HFA;VENTOLIN HFA) 108 (90 BASE) MCG/ACT inhaler Inhale 2 puffs into the lungs every 6 (six)  hours as needed for wheezing or shortness of breath.   Yes Historical Provider, MD  amLODipine (NORVASC) 5 MG tablet Take 5 mg  by mouth daily with breakfast.   Yes Historical Provider, MD  aspirin EC 81 MG tablet Take 81 mg by mouth at bedtime.   Yes Historical Provider, MD  loratadine (CLARITIN) 10 MG tablet Take 10 mg by mouth daily.   Yes Historical Provider, MD  Multiple Vitamin (MULTIVITAMIN WITH MINERALS) TABS tablet Take 1 tablet by mouth daily with breakfast.   Yes Historical Provider, MD  ramipril (ALTACE) 10 MG capsule Take 10 mg by mouth daily with breakfast.   Yes Historical Provider, MD  simvastatin (ZOCOR) 40 MG tablet Take 40 mg by mouth at bedtime.   Yes Historical Provider, MD   Physical Exam: Filed Vitals:   01/30/14 1022 01/30/14 1142  BP: 137/51 121/51  Pulse: 109 94  Temp: 97.6 F (36.4 C)   TempSrc: Oral   Resp: 20 20  SpO2: 100% 98%    Physical Exam  Constitutional: Appears well-developed and well-nourished. No distress.  HENT: Normocephalic. No tonsillar erythema or exudates Eyes: Conjunctivae and EOM are normal. PERRLA, no scleral icterus.  Neck: Normal ROM. Neck supple. No JVD. No tracheal deviation. No thyromegaly.  CVS: RRR, S1/S2 +, no murmurs, no gallops, no carotid bruit.  Pulmonary: Effort and breath sounds normal, no stridor, rhonchi, wheezes, rales.  Abdominal: Soft. BS +,  no distension, tenderness, rebound or guarding.  Musculoskeletal: Normal range of motion. No edema and no tenderness.  Lymphadenopathy: No lymphadenopathy noted, cervical, inguinal. Neuro: Alert. Normal reflexes, muscle tone coordination. No focal neurologic deficits. Skin: Skin is warm and dry. No rash noted.  No erythema. No pallor.  Psychiatric: Normal mood and affect. Behavior, judgment, thought content normal.   Labs on Admission:  Basic Metabolic Panel:  Recent Labs Lab 01/30/14 1038  NA 137  K 4.1  CL 105  CO2 22  GLUCOSE 131*  BUN 58*  CREATININE 1.63*  CALCIUM 8.9   Liver Function Tests:  Recent Labs Lab 01/30/14 1038  AST 22  ALT 14  ALKPHOS 27*  BILITOT 0.6  PROT 6.2   ALBUMIN 3.9   No results for input(s): LIPASE, AMYLASE in the last 168 hours. No results for input(s): AMMONIA in the last 168 hours. CBC:  Recent Labs Lab 01/30/14 1038  WBC 13.8*  HGB 9.6*  HCT 29.4*  MCV 92.5  PLT 257   Cardiac Enzymes: No results for input(s): CKTOTAL, CKMB, CKMBINDEX, TROPONINI in the last 168 hours. BNP: Invalid input(s): POCBNP CBG: No results for input(s): GLUCAP in the last 168 hours.  If 7PM-7AM, please contact night-coverage [email or web address] Password [facility] 01/30/2014, 11:55 AM

## 2014-01-30 NOTE — ED Notes (Signed)
Pt transferred to ENDO 

## 2014-01-31 ENCOUNTER — Encounter (HOSPITAL_COMMUNITY): Payer: Self-pay | Admitting: Gastroenterology

## 2014-01-31 LAB — COMPREHENSIVE METABOLIC PANEL
ALBUMIN: 3.3 g/dL — AB (ref 3.5–5.2)
ALT: 14 U/L (ref 0–53)
AST: 18 U/L (ref 0–37)
Alkaline Phosphatase: 27 U/L — ABNORMAL LOW (ref 39–117)
Anion gap: 2 — ABNORMAL LOW (ref 5–15)
BUN: 41 mg/dL — ABNORMAL HIGH (ref 6–23)
CO2: 28 mmol/L (ref 19–32)
CREATININE: 1.27 mg/dL (ref 0.50–1.35)
Calcium: 8.8 mg/dL (ref 8.4–10.5)
Chloride: 109 mEq/L (ref 96–112)
GFR calc Af Amer: 59 mL/min — ABNORMAL LOW (ref >90)
GFR, EST NON AFRICAN AMERICAN: 50 mL/min — AB (ref >90)
Glucose, Bld: 107 mg/dL — ABNORMAL HIGH (ref 70–99)
Potassium: 4.8 mmol/L (ref 3.5–5.1)
Sodium: 139 mmol/L (ref 135–145)
TOTAL PROTEIN: 5.3 g/dL — AB (ref 6.0–8.3)
Total Bilirubin: 0.2 mg/dL — ABNORMAL LOW (ref 0.3–1.2)

## 2014-01-31 LAB — CBC
HEMATOCRIT: 24.4 % — AB (ref 39.0–52.0)
Hemoglobin: 8.2 g/dL — ABNORMAL LOW (ref 13.0–17.0)
MCH: 30.7 pg (ref 26.0–34.0)
MCHC: 33.6 g/dL (ref 30.0–36.0)
MCV: 91.4 fL (ref 78.0–100.0)
PLATELETS: 202 10*3/uL (ref 150–400)
RBC: 2.67 MIL/uL — AB (ref 4.22–5.81)
RDW: 13.5 % (ref 11.5–15.5)
WBC: 9.6 10*3/uL (ref 4.0–10.5)

## 2014-01-31 LAB — GLUCOSE, CAPILLARY: Glucose-Capillary: 95 mg/dL (ref 70–99)

## 2014-01-31 LAB — PREPARE RBC (CROSSMATCH)

## 2014-01-31 LAB — PROTIME-INR
INR: 1.1 (ref 0.00–1.49)
Prothrombin Time: 14.3 seconds (ref 11.6–15.2)

## 2014-01-31 LAB — APTT: aPTT: 28 seconds (ref 24–37)

## 2014-01-31 MED ORDER — SODIUM CHLORIDE 0.9 % IV SOLN: Freq: Once | INTRAVENOUS | Status: AC

## 2014-01-31 MED ORDER — PANTOPRAZOLE SODIUM 40 MG PO TBEC: 40.0000 mg | DELAYED_RELEASE_TABLET | Freq: Two times a day (BID) | ORAL | Status: DC

## 2014-01-31 MED ORDER — FERROUS SULFATE 300 (60 FE) MG/5ML PO SYRP: 300.0000 mg | ORAL_SOLUTION | Freq: Every day | ORAL | Status: DC

## 2014-01-31 NOTE — Discharge Summary (Addendum)
Physician Discharge Summary  CHE RACHAL MRN: [id number] DOB/AGE: 09/16/1928 80 y.o.  PCP: Tivis Ringer, MD   Admit date: 01/30/2014 Discharge date: 01/31/2014  Discharge Diagnoses:      GI bleed secondary to stomach ulcers   Acute post-hemorrhagic anemia   Acute duodenal ulcer with bleeding  Follow-up recommendations Follow-up with PCP in in 3 days for a CBC on Monday     Medication List    STOP taking these medications        aspirin EC 81 MG tablet      TAKE these medications        albuterol 108 (90 BASE) MCG/ACT inhaler  Commonly known as:  PROVENTIL HFA;VENTOLIN HFA  Inhale 2 puffs into the lungs every 6 (six) hours as needed for wheezing or shortness of breath.     amLODipine 5 MG tablet  Commonly known as:  NORVASC  Take 5 mg by mouth daily with breakfast.     ferrous sulfate 300 (60 FE) MG/5ML syrup  Take 5 mLs (300 mg total) by mouth daily.     loratadine 10 MG tablet  Commonly known as:  CLARITIN  Take 10 mg by mouth daily.     multivitamin with minerals Tabs tablet  Take 1 tablet by mouth daily with breakfast.     pantoprazole 40 MG tablet  Commonly known as:  PROTONIX  Take 1 tablet (40 mg total) by mouth 2 (two) times daily.     ramipril 10 MG capsule  Commonly known as:  ALTACE  Take 10 mg by mouth daily with breakfast.     simvastatin 40 MG tablet  Commonly known as:  ZOCOR  Take 40 mg by mouth at bedtime.        Discharge Condition: Stable Disposition:    Consults: Gastroenterology  Significant Diagnostic Studies: No results found.     Microbiology: No results found for this or any previous visit (from the past 240 hour(s)).   Labs: Results for orders placed or performed during the hospital encounter of 01/30/14 (from the past 48 hour(s))  ABO/Rh     Status: None   Collection Time: 01/30/14 10:33 AM  Result Value Ref Range   ABO/RH(D) A POS   CBC     Status: Abnormal   Collection Time: 01/30/14 10:38  AM  Result Value Ref Range   WBC 13.8 (H) 4.0 - 10.5 K/uL   RBC 3.18 (L) 4.22 - 5.81 MIL/uL   Hemoglobin 9.6 (L) 13.0 - 17.0 g/dL   HCT 29.4 (L) 39.0 - 52.0 %   MCV 92.5 78.0 - 100.0 fL   MCH 30.2 26.0 - 34.0 pg   MCHC 32.7 30.0 - 36.0 g/dL   RDW 13.4 11.5 - 15.5 %   Platelets 257 150 - 400 K/uL  Comprehensive metabolic panel     Status: Abnormal   Collection Time: 01/30/14 10:38 AM  Result Value Ref Range   Sodium 137 135 - 145 mmol/L    Comment: Please note change in reference range.   Potassium 4.1 3.5 - 5.1 mmol/L    Comment: Please note change in reference range.   Chloride 105 96 - 112 mEq/L   CO2 22 19 - 32 mmol/L   Glucose, Bld 131 (H) 70 - 99 mg/dL   BUN 58 (H) 6 - 23 mg/dL   Creatinine, Ser 1.63 (H) 0.50 - 1.35 mg/dL   Calcium 8.9 8.4 - 10.5 mg/dL   Total Protein 6.2 6.0 -  8.3 g/dL   Albumin 3.9 3.5 - 5.2 g/dL   AST 22 0 - 37 U/L   ALT 14 0 - 53 U/L   Alkaline Phosphatase 27 (L) 39 - 117 U/L   Total Bilirubin 0.6 0.3 - 1.2 mg/dL   GFR calc non Af Amer 37 (L) >90 mL/min   GFR calc Af Amer 43 (L) >90 mL/min    Comment: (NOTE) The eGFR has been calculated using the CKD EPI equation. This calculation has not been validated in all clinical situations. eGFR's persistently <90 mL/min signify possible Chronic Kidney Disease.    Anion gap 10 5 - 15  Type and screen for Red Blood Exchange     Status: None (Preliminary result)   Collection Time: 01/30/14 10:38 AM  Result Value Ref Range   ABO/RH(D) A POS    Antibody Screen NEG    Sample Expiration 02/02/2014    Unit Number [id number]    Blood Component Type RED CELLS,LR    Unit division 00    Status of Unit ALLOCATED    Transfusion Status OK TO TRANSFUSE    Crossmatch Result Compatible   I-stat troponin, ED     Status: None   Collection Time: 01/30/14 10:54 AM  Result Value Ref Range   Troponin i, poc 0.00 0.00 - 0.08 ng/mL   Comment 3            Comment: Due to the release kinetics of cTnI, a negative  result within the first hours of the onset of symptoms does not rule out myocardial infarction with certainty. If myocardial infarction is still suspected, repeat the test at appropriate intervals.   I-Stat CG4 Lactic Acid, ED     Status: Abnormal   Collection Time: 01/30/14 10:56 AM  Result Value Ref Range   Lactic Acid, Venous 2.50 (H) 0.5 - 2.2 mmol/L  POC occult blood, ED Provider will collect     Status: Abnormal   Collection Time: 01/30/14 10:59 AM  Result Value Ref Range   Fecal Occult Bld POSITIVE (A) NEGATIVE  Comprehensive metabolic panel     Status: Abnormal   Collection Time: 01/31/14  3:56 AM  Result Value Ref Range   Sodium 139 135 - 145 mmol/L    Comment: Please note change in reference range. REPEATED TO VERIFY    Potassium 4.8 3.5 - 5.1 mmol/L    Comment: Please note change in reference range. REPEATED TO VERIFY    Chloride 109 96 - 112 mEq/L    Comment: REPEATED TO VERIFY   CO2 28 19 - 32 mmol/L    Comment: REPEATED TO VERIFY   Glucose, Bld 107 (H) 70 - 99 mg/dL   BUN 41 (H) 6 - 23 mg/dL   Creatinine, Ser 1.27 0.50 - 1.35 mg/dL   Calcium 8.8 8.4 - 10.5 mg/dL    Comment: REPEATED TO VERIFY   Total Protein 5.3 (L) 6.0 - 8.3 g/dL   Albumin 3.3 (L) 3.5 - 5.2 g/dL   AST 18 0 - 37 U/L   ALT 14 0 - 53 U/L   Alkaline Phosphatase 27 (L) 39 - 117 U/L   Total Bilirubin 0.2 (L) 0.3 - 1.2 mg/dL   GFR calc non Af Amer 50 (L) >90 mL/min   GFR calc Af Amer 59 (L) >90 mL/min    Comment: (NOTE) The eGFR has been calculated using the CKD EPI equation. This calculation has not been validated in all clinical situations. eGFR's persistently <90 mL/min signify  possible Chronic Kidney Disease.    Anion gap 2 (L) 5 - 15    Comment: REPEATED TO VERIFY  CBC     Status: Abnormal   Collection Time: 01/31/14  3:56 AM  Result Value Ref Range   WBC 9.6 4.0 - 10.5 K/uL   RBC 2.67 (L) 4.22 - 5.81 MIL/uL   Hemoglobin 8.2 (L) 13.0 - 17.0 g/dL   HCT 24.4 (L) 39.0 - 52.0 %   MCV  91.4 78.0 - 100.0 fL   MCH 30.7 26.0 - 34.0 pg   MCHC 33.6 30.0 - 36.0 g/dL   RDW 13.5 11.5 - 15.5 %   Platelets 202 150 - 400 K/uL  Protime-INR     Status: None   Collection Time: 01/31/14  3:56 AM  Result Value Ref Range   Prothrombin Time 14.3 11.6 - 15.2 seconds   INR 1.10 0.00 - 1.49  APTT     Status: None   Collection Time: 01/31/14  3:56 AM  Result Value Ref Range   aPTT 28 24 - 37 seconds  Glucose, capillary     Status: None   Collection Time: 01/31/14  7:52 AM  Result Value Ref Range   Glucose-Capillary 95 70 - 99 mg/dL  Prepare RBC     Status: None   Collection Time: 01/31/14 11:30 AM  Result Value Ref Range   Order Confirmation ORDER PROCESSED BY BLOOD BANK      HPI 80 year old male with past medical history of hypertension, dyslipidemia who presented to [facility] ED with reports of black Perry stool for past 2 days prior to this admission. Patient did not report the diarrhea or constipation area to no abdominal pain, nausea or vomiting. Patient does not recall previous similar events. He states aspirin 1 daily basis. No other complaints such as chest pain, shortness of breath or palpitations. No fevers or chills. No lightheadedness or loss of consciousness. In ED, patient is hemodynamically stable with blood pressure 121/51, heart rate 94, afebrile and oxygen saturation 98% on room air. Blood work revealed white blood cell count of 13.8, hemoglobin 9.6, creatinine is 1.63 on admission. Fecal occult blood test was positive. Patient was admitted for further evaluation. The GI will see the patient in consultation.   HOSPITAL COURSE: * Upper GI bleed  Triggering agent likely aspirin. Aspirin was put on hold. Bleeding due to duodenal ulcers seen on EGD 12/23 not actively bleeding, hemoglobin has dropped from 9.6-8.2  Gastroenterology recommends twice a day Protonix for 2 weeks and then once daily  Biopsies for H. pylori pending  Patient being transfused with 1 unit of packed red  blood cells  Needs a repeat CBC next Monday  Okay to discharge home per Dr. Deatra Ina    Essential hypertension  Patient advised to hold antihypertensive medications until Monday  Acute renal failure  Likely due to ACE inhibitor, ramipril. Ramipril put on hold.  Patient will need repeat BMP next week  Dyslipidemia  May resume Zocor 40 mg daily  DVT prophylaxis:   SCD's bilaterally    Discharge Exam:  Blood pressure 123/64, pulse 88, temperature 98.1 F (36.7 C), temperature source Oral, resp. rate 20, height _0  (1.727 m), weight 72.394 kg (159 lb 9.6 oz), SpO2 98 %.  Eyes: Conjunctivae and EOM are normal. PERRLA, no scleral icterus.  Neck: Normal ROM. Neck supple. No JVD. No tracheal deviation. No thyromegaly.  CVS: RRR, S1/S2 +, no murmurs, no gallops, no carotid bruit.  Pulmonary: Effort and breath  sounds normal, no stridor, rhonchi, wheezes, rales.  Abdominal: Soft. BS +, no distension, tenderness, rebound or guarding.  Musculoskeletal: Normal range of motion. No edema and no tenderness.  Lymphadenopathy: No lymphadenopathy noted, cervical, inguinal. Neuro: Alert. Normal reflexes, muscle tone coordination. No focal neurologic deficits. Skin: Skin is warm and dry. No rash noted. No erythema. No pallor.        Discharge Instructions    Diet - low sodium heart healthy    Complete by:  As directed      Diet - low sodium heart healthy    Complete by:  As directed      Increase activity slowly    Complete by:  As directed      Increase activity slowly    Complete by:  As directed            Follow-up Information    Follow up with Tivis Ringer, MD. Schedule an appointment as soon as possible for a visit in 3 days.   Specialty:  Internal Medicine   Why:  CBC on Monday   Contact information:   [place] [address] [phone]        Signed: Reyne Dumas 01/31/2014, 11:13 AM

## 2014-01-31 NOTE — Progress Notes (Signed)
     [facility] Progress Note  Subjective:  Feels good.   Tolerating regular diet.  Had a small dark colored/black BM last night (probably old blood at this point).  Objective:  Vital signs in last 24 hours: Temp:  [97.6 F (36.4 C)-98.3 F (36.8 C)] 98.1 F (36.7 C) (12/24 0555) Pulse Rate:  [88-109] 88 (12/24 0555) Resp:  [17-24] 20 (12/24 0555) BP: (82-144)/(38-64) 123/64 mmHg (12/24 0555) SpO2:  [98 %-100 %] 98 % (12/24 0555) Weight:  [159 lb 9.6 oz (72.394 kg)-165 lb (74.844 kg)] 159 lb 9.6 oz (72.394 kg) (12/24 0555) Last BM Date: 01/30/14 General:  Alert, Well-developed, in NAD Heart:  Regular rate and rhythm; no murmurs Pulm:  CTAB.  No W/R/R. Abdomen:  Soft, non-distended.  BS present.  Non-tender. Extremities:  Without edema. Neurologic:  Alert and  oriented x4;  grossly normal neurologically. Psych:  Alert and cooperative. Normal mood and affect.  Intake/Output from previous day: 12/23 0701 - 12/24 0700 In: 1990 [P.O.:480; I.V.:1510] Out: -   Lab Results:  Recent Labs  01/30/14 1038 01/31/14 0356  WBC 13.8* 9.6  HGB 9.6* 8.2*  HCT 29.4* 24.4*  PLT 257 202   BMET  Recent Labs  01/30/14 1038 01/31/14 0356  NA 137 139  K 4.1 4.8  CL 105 109  CO2 22 28  GLUCOSE 131* 107*  BUN 58* 41*  CREATININE 1.63* 1.27  CALCIUM 8.9 8.8   LFT  Recent Labs  01/31/14 0356  PROT 5.3*  ALBUMIN 3.3*  AST 18  ALT 14  ALKPHOS 27*  BILITOT 0.2*   PT/INR  Recent Labs  01/31/14 0356  LABPROT 14.3  INR 1.10   Assessment / Plan: -Duodenal ulcers:  Seen on EGD 12/23; not actively bleeding. -UGIB/melena:  Secondary to above.  Hgb down some this AM from 9.6 to 8.2 grams, but may be somewhat dilutional.  *Twice daily PPI. *Biopsies pending and will treat for Hpylori as outpatient if positive. *Can be discharged later today with close follow-up with PCP next week and repeat CBC on Monday.   LOS: 1 day   ZEHR, JESSICA D.  01/31/2014, 8:55  AM  Pager number [phone]  No further bleeding.  Agree with plans for discharge.  Recommend twice daily PPI for 2 weeks and then once daily.

## 2014-01-31 NOTE — Progress Notes (Signed)
CARE MANAGEMENT NOTE 01/31/2014  Patient:  Gerald Valdez, Gerald Valdez   Account Number:  [id number]  Date Initiated:  01/31/2014  Documentation initiated by:  Karl Bales  Subjective/Objective Assessment:   pt admitted with GI bleed     Action/Plan:   from home   Anticipated DC Date:  02/01/2014   Anticipated DC Plan:  HOME/SELF CARE         Choice offered to / List presented to:             Status of service:  In process, will continue to follow Medicare Important Message given?   (If response is "NO", the following Medicare IM given date fields will be blank) Date Medicare IM given:   Medicare IM given by:   Date Additional Medicare IM given:   Additional Medicare IM given by:    Discharge Disposition:    Per UR Regulation:  Reviewed for med. necessity/level of care/duration of stay  If discussed at [place] of Stay Meetings, dates discussed:    Comments:  01/30/14 MMcGibboney, RN, BSN Chart reviewed.

## 2014-01-31 NOTE — Evaluation (Signed)
Occupational Therapy Evaluation Patient Details Name: Gerald Valdez MRN: [id number] DOB: [birth year] Today's Date: 22-Feb-2014    History of Present Illness 80 year old male with past medical history of hypertension, dyslipidemia who presented to [facility] ED with reports of black Perry stool for past 2 days prior to this admission. Patient did not report the diarrhea or constipation area to no abdominal pain, nausea or vomiting. Patient does not recall previous similar events. He states aspirin 1 daily basis. No other complaints such as chest pain, shortness of breath or palpitations. No fevers or chills. No lightheadedness or loss of consciousness.   Clinical Impression   No further OT needs   Follow Up Recommendations  No OT follow up    Equipment Recommendations  None recommended by OT    Recommendations for Other Services       Precautions / Restrictions        Mobility Bed Mobility Overal bed mobility: Independent                Transfers Overall transfer level: Independent                         ADL Overall ADL's : Needs assistance/impaired Eating/Feeding: Independent;Sitting   Grooming: Sitting;Set up   Upper Body Bathing: Set up;Sitting   Lower Body Bathing: Set up;Sit to/from stand   Upper Body Dressing : Set up;Sitting   Lower Body Dressing: Supervision/safety;Sit to/from stand   Toilet Transfer: Copy Details (indicate cue type and reason): sit - stand Toileting- Water quality scientist and Hygiene: Supervision/safety;Sit to/from stand       Functional mobility during ADLs: Supervision/safety General ADL Comments: Pt overall- S to I with simple ADL tasks.  Pt getting blood - so worked EOB only.  Pt does not feel further OT or PT needed               Pertinent Vitals/Pain Pain Assessment: No/denies pain     Hand Dominance     Extremity/Trunk Assessment Upper Extremity Assessment Upper Extremity  Assessment: Overall WFL for tasks assessed           Communication Communication Communication: No difficulties   Cognition Arousal/Alertness: Awake/alert Behavior During Therapy: WFL for tasks assessed/performed Overall Cognitive Status: Within Functional Limits for tasks assessed                     General Comments    wife will A as needed           Home Living Family/patient expects to be discharged to:: Private residence Living Arrangements: Spouse/significant other   Type of Home: House Home Access: Stairs to enter;Ramped entrance     Home Layout: One level     Bathroom Shower/Tub: Tub/shower unit         Home Equipment: None          Prior Functioning/Environment Level of Independence: Independent                                       End of Session Nurse Communication: Mobility status  Activity Tolerance: Patient tolerated treatment well Patient left: in bed   Time: 1155-1211 OT Time Calculation (min): 16 min Charges:  OT General Charges $OT Visit: 1 Procedure OT Evaluation $Initial OT Evaluation Tier I: 1 Procedure G-Codes:    Betsy Pries 2014-02-22, 12:22 PM

## 2014-02-01 LAB — TYPE AND SCREEN
ABO/RH(D): A POS
ANTIBODY SCREEN: NEGATIVE
Unit division: 0

## 2014-02-04 DIAGNOSIS — J449 Chronic obstructive pulmonary disease, unspecified: Secondary | ICD-10-CM | POA: Diagnosis not present

## 2014-02-04 DIAGNOSIS — D509 Iron deficiency anemia, unspecified: Secondary | ICD-10-CM | POA: Diagnosis not present

## 2014-02-04 DIAGNOSIS — R0609 Other forms of dyspnea: Secondary | ICD-10-CM | POA: Diagnosis not present

## 2014-02-04 DIAGNOSIS — K269 Duodenal ulcer, unspecified as acute or chronic, without hemorrhage or perforation: Secondary | ICD-10-CM | POA: Diagnosis not present

## 2014-02-04 DIAGNOSIS — Z6825 Body mass index (BMI) 25.0-25.9, adult: Secondary | ICD-10-CM | POA: Diagnosis not present

## 2014-02-04 DIAGNOSIS — I10 Essential (primary) hypertension: Secondary | ICD-10-CM | POA: Diagnosis not present

## 2014-02-04 DIAGNOSIS — K625 Hemorrhage of anus and rectum: Secondary | ICD-10-CM | POA: Diagnosis not present

## 2014-02-11 DIAGNOSIS — D225 Melanocytic nevi of trunk: Secondary | ICD-10-CM | POA: Diagnosis not present

## 2014-02-11 DIAGNOSIS — Z08 Encounter for follow-up examination after completed treatment for malignant neoplasm: Secondary | ICD-10-CM | POA: Diagnosis not present

## 2014-02-11 DIAGNOSIS — L570 Actinic keratosis: Secondary | ICD-10-CM | POA: Diagnosis not present

## 2014-02-11 DIAGNOSIS — L821 Other seborrheic keratosis: Secondary | ICD-10-CM | POA: Diagnosis not present

## 2014-02-11 DIAGNOSIS — Z85828 Personal history of other malignant neoplasm of skin: Secondary | ICD-10-CM | POA: Diagnosis not present

## 2014-02-21 ENCOUNTER — Encounter (HOSPITAL_COMMUNITY): Payer: Self-pay | Admitting: Gastroenterology

## 2014-03-04 DIAGNOSIS — D509 Iron deficiency anemia, unspecified: Secondary | ICD-10-CM | POA: Diagnosis not present

## 2014-04-30 DIAGNOSIS — I10 Essential (primary) hypertension: Secondary | ICD-10-CM | POA: Diagnosis not present

## 2014-04-30 DIAGNOSIS — Z125 Encounter for screening for malignant neoplasm of prostate: Secondary | ICD-10-CM | POA: Diagnosis not present

## 2014-04-30 DIAGNOSIS — E785 Hyperlipidemia, unspecified: Secondary | ICD-10-CM | POA: Diagnosis not present

## 2014-05-07 DIAGNOSIS — Z6825 Body mass index (BMI) 25.0-25.9, adult: Secondary | ICD-10-CM | POA: Diagnosis not present

## 2014-05-07 DIAGNOSIS — Z1389 Encounter for screening for other disorder: Secondary | ICD-10-CM | POA: Diagnosis not present

## 2014-05-07 DIAGNOSIS — M1990 Unspecified osteoarthritis, unspecified site: Secondary | ICD-10-CM | POA: Diagnosis not present

## 2014-05-07 DIAGNOSIS — K269 Duodenal ulcer, unspecified as acute or chronic, without hemorrhage or perforation: Secondary | ICD-10-CM | POA: Diagnosis not present

## 2014-05-07 DIAGNOSIS — I10 Essential (primary) hypertension: Secondary | ICD-10-CM | POA: Diagnosis not present

## 2014-05-07 DIAGNOSIS — E785 Hyperlipidemia, unspecified: Secondary | ICD-10-CM | POA: Diagnosis not present

## 2014-05-07 DIAGNOSIS — R9431 Abnormal electrocardiogram [ECG] [EKG]: Secondary | ICD-10-CM | POA: Diagnosis not present

## 2014-05-07 DIAGNOSIS — J449 Chronic obstructive pulmonary disease, unspecified: Secondary | ICD-10-CM | POA: Diagnosis not present

## 2014-05-07 DIAGNOSIS — K5730 Diverticulosis of large intestine without perforation or abscess without bleeding: Secondary | ICD-10-CM | POA: Diagnosis not present

## 2014-05-07 DIAGNOSIS — Z0000 Encounter for general adult medical examination without abnormal findings: Secondary | ICD-10-CM | POA: Diagnosis not present

## 2014-05-07 DIAGNOSIS — Z23 Encounter for immunization: Secondary | ICD-10-CM | POA: Diagnosis not present

## 2014-05-07 DIAGNOSIS — D509 Iron deficiency anemia, unspecified: Secondary | ICD-10-CM | POA: Diagnosis not present

## 2014-05-08 DIAGNOSIS — Z1212 Encounter for screening for malignant neoplasm of rectum: Secondary | ICD-10-CM | POA: Diagnosis not present

## 2014-07-29 DIAGNOSIS — L570 Actinic keratosis: Secondary | ICD-10-CM | POA: Diagnosis not present

## 2014-08-13 DIAGNOSIS — D509 Iron deficiency anemia, unspecified: Secondary | ICD-10-CM | POA: Diagnosis not present

## 2014-08-22 DIAGNOSIS — J302 Other seasonal allergic rhinitis: Secondary | ICD-10-CM | POA: Diagnosis not present

## 2014-08-22 DIAGNOSIS — I10 Essential (primary) hypertension: Secondary | ICD-10-CM | POA: Diagnosis not present

## 2014-08-22 DIAGNOSIS — Z6825 Body mass index (BMI) 25.0-25.9, adult: Secondary | ICD-10-CM | POA: Diagnosis not present

## 2014-08-22 DIAGNOSIS — D509 Iron deficiency anemia, unspecified: Secondary | ICD-10-CM | POA: Diagnosis not present

## 2014-08-22 DIAGNOSIS — K269 Duodenal ulcer, unspecified as acute or chronic, without hemorrhage or perforation: Secondary | ICD-10-CM | POA: Diagnosis not present

## 2014-09-24 DIAGNOSIS — H02834 Dermatochalasis of left upper eyelid: Secondary | ICD-10-CM | POA: Diagnosis not present

## 2014-09-24 DIAGNOSIS — H02831 Dermatochalasis of right upper eyelid: Secondary | ICD-10-CM | POA: Diagnosis not present

## 2014-09-24 DIAGNOSIS — H35372 Puckering of macula, left eye: Secondary | ICD-10-CM | POA: Diagnosis not present

## 2014-09-24 DIAGNOSIS — H52203 Unspecified astigmatism, bilateral: Secondary | ICD-10-CM | POA: Diagnosis not present

## 2014-10-20 DIAGNOSIS — J019 Acute sinusitis, unspecified: Secondary | ICD-10-CM | POA: Diagnosis not present

## 2014-10-20 DIAGNOSIS — J209 Acute bronchitis, unspecified: Secondary | ICD-10-CM | POA: Diagnosis not present

## 2014-11-02 DIAGNOSIS — Z23 Encounter for immunization: Secondary | ICD-10-CM | POA: Diagnosis not present

## 2014-12-19 DIAGNOSIS — S63501A Unspecified sprain of right wrist, initial encounter: Secondary | ICD-10-CM | POA: Diagnosis not present

## 2015-02-12 DIAGNOSIS — L570 Actinic keratosis: Secondary | ICD-10-CM | POA: Diagnosis not present

## 2015-03-25 DIAGNOSIS — J209 Acute bronchitis, unspecified: Secondary | ICD-10-CM | POA: Diagnosis not present

## 2015-03-25 DIAGNOSIS — J0100 Acute maxillary sinusitis, unspecified: Secondary | ICD-10-CM | POA: Diagnosis not present

## 2015-03-25 DIAGNOSIS — R509 Fever, unspecified: Secondary | ICD-10-CM | POA: Diagnosis not present

## 2015-05-22 DIAGNOSIS — I10 Essential (primary) hypertension: Secondary | ICD-10-CM | POA: Diagnosis not present

## 2015-05-22 DIAGNOSIS — Z125 Encounter for screening for malignant neoplasm of prostate: Secondary | ICD-10-CM | POA: Diagnosis not present

## 2015-05-22 DIAGNOSIS — E784 Other hyperlipidemia: Secondary | ICD-10-CM | POA: Diagnosis not present

## 2015-05-30 DIAGNOSIS — D509 Iron deficiency anemia, unspecified: Secondary | ICD-10-CM | POA: Diagnosis not present

## 2015-05-30 DIAGNOSIS — J302 Other seasonal allergic rhinitis: Secondary | ICD-10-CM | POA: Diagnosis not present

## 2015-05-30 DIAGNOSIS — K269 Duodenal ulcer, unspecified as acute or chronic, without hemorrhage or perforation: Secondary | ICD-10-CM | POA: Diagnosis not present

## 2015-05-30 DIAGNOSIS — E784 Other hyperlipidemia: Secondary | ICD-10-CM | POA: Diagnosis not present

## 2015-05-30 DIAGNOSIS — N183 Chronic kidney disease, stage 3 (moderate): Secondary | ICD-10-CM | POA: Diagnosis not present

## 2015-05-30 DIAGNOSIS — J449 Chronic obstructive pulmonary disease, unspecified: Secondary | ICD-10-CM | POA: Diagnosis not present

## 2015-05-30 DIAGNOSIS — K5730 Diverticulosis of large intestine without perforation or abscess without bleeding: Secondary | ICD-10-CM | POA: Diagnosis not present

## 2015-05-30 DIAGNOSIS — M1990 Unspecified osteoarthritis, unspecified site: Secondary | ICD-10-CM | POA: Diagnosis not present

## 2015-05-30 DIAGNOSIS — Z0000 Encounter for general adult medical examination without abnormal findings: Secondary | ICD-10-CM | POA: Diagnosis not present

## 2015-05-30 DIAGNOSIS — I10 Essential (primary) hypertension: Secondary | ICD-10-CM | POA: Diagnosis not present

## 2015-05-30 DIAGNOSIS — C679 Malignant neoplasm of bladder, unspecified: Secondary | ICD-10-CM | POA: Diagnosis not present

## 2015-05-30 DIAGNOSIS — Z1389 Encounter for screening for other disorder: Secondary | ICD-10-CM | POA: Diagnosis not present

## 2015-08-13 DIAGNOSIS — C44319 Basal cell carcinoma of skin of other parts of face: Secondary | ICD-10-CM | POA: Diagnosis not present

## 2015-09-16 DIAGNOSIS — N183 Chronic kidney disease, stage 3 (moderate): Secondary | ICD-10-CM | POA: Diagnosis not present

## 2015-09-23 DIAGNOSIS — L309 Dermatitis, unspecified: Secondary | ICD-10-CM | POA: Diagnosis not present

## 2015-09-23 DIAGNOSIS — Z85828 Personal history of other malignant neoplasm of skin: Secondary | ICD-10-CM | POA: Diagnosis not present

## 2015-09-30 DIAGNOSIS — Z961 Presence of intraocular lens: Secondary | ICD-10-CM | POA: Diagnosis not present

## 2015-09-30 DIAGNOSIS — H52203 Unspecified astigmatism, bilateral: Secondary | ICD-10-CM | POA: Diagnosis not present

## 2015-09-30 DIAGNOSIS — H35372 Puckering of macula, left eye: Secondary | ICD-10-CM | POA: Diagnosis not present

## 2015-10-07 DIAGNOSIS — N183 Chronic kidney disease, stage 3 (moderate): Secondary | ICD-10-CM | POA: Diagnosis not present

## 2015-10-14 DIAGNOSIS — Z6825 Body mass index (BMI) 25.0-25.9, adult: Secondary | ICD-10-CM | POA: Diagnosis not present

## 2015-10-14 DIAGNOSIS — J449 Chronic obstructive pulmonary disease, unspecified: Secondary | ICD-10-CM | POA: Diagnosis not present

## 2015-10-14 DIAGNOSIS — C679 Malignant neoplasm of bladder, unspecified: Secondary | ICD-10-CM | POA: Diagnosis not present

## 2015-10-14 DIAGNOSIS — N183 Chronic kidney disease, stage 3 (moderate): Secondary | ICD-10-CM | POA: Diagnosis not present

## 2015-10-14 DIAGNOSIS — J302 Other seasonal allergic rhinitis: Secondary | ICD-10-CM | POA: Diagnosis not present

## 2015-10-14 DIAGNOSIS — I10 Essential (primary) hypertension: Secondary | ICD-10-CM | POA: Diagnosis not present

## 2015-10-24 DIAGNOSIS — Z23 Encounter for immunization: Secondary | ICD-10-CM | POA: Diagnosis not present

## 2015-12-12 DIAGNOSIS — R05 Cough: Secondary | ICD-10-CM | POA: Diagnosis not present

## 2015-12-23 DIAGNOSIS — Z85828 Personal history of other malignant neoplasm of skin: Secondary | ICD-10-CM | POA: Diagnosis not present

## 2016-02-28 DIAGNOSIS — M940 Chondrocostal junction syndrome [Tietze]: Secondary | ICD-10-CM | POA: Diagnosis not present

## 2016-06-01 DIAGNOSIS — Z125 Encounter for screening for malignant neoplasm of prostate: Secondary | ICD-10-CM | POA: Diagnosis not present

## 2016-06-01 DIAGNOSIS — I10 Essential (primary) hypertension: Secondary | ICD-10-CM | POA: Diagnosis not present

## 2016-06-01 DIAGNOSIS — E784 Other hyperlipidemia: Secondary | ICD-10-CM | POA: Diagnosis not present

## 2016-06-08 DIAGNOSIS — E784 Other hyperlipidemia: Secondary | ICD-10-CM | POA: Diagnosis not present

## 2016-06-08 DIAGNOSIS — J449 Chronic obstructive pulmonary disease, unspecified: Secondary | ICD-10-CM | POA: Diagnosis not present

## 2016-06-08 DIAGNOSIS — C679 Malignant neoplasm of bladder, unspecified: Secondary | ICD-10-CM | POA: Diagnosis not present

## 2016-06-08 DIAGNOSIS — N183 Chronic kidney disease, stage 3 (moderate): Secondary | ICD-10-CM | POA: Diagnosis not present

## 2016-06-08 DIAGNOSIS — I10 Essential (primary) hypertension: Secondary | ICD-10-CM | POA: Diagnosis not present

## 2016-06-08 DIAGNOSIS — Z0000 Encounter for general adult medical examination without abnormal findings: Secondary | ICD-10-CM | POA: Diagnosis not present

## 2016-06-08 DIAGNOSIS — Z6825 Body mass index (BMI) 25.0-25.9, adult: Secondary | ICD-10-CM | POA: Diagnosis not present

## 2016-06-08 DIAGNOSIS — K5730 Diverticulosis of large intestine without perforation or abscess without bleeding: Secondary | ICD-10-CM | POA: Diagnosis not present

## 2016-06-08 DIAGNOSIS — K269 Duodenal ulcer, unspecified as acute or chronic, without hemorrhage or perforation: Secondary | ICD-10-CM | POA: Diagnosis not present

## 2016-06-08 DIAGNOSIS — M1990 Unspecified osteoarthritis, unspecified site: Secondary | ICD-10-CM | POA: Diagnosis not present

## 2016-06-08 DIAGNOSIS — D509 Iron deficiency anemia, unspecified: Secondary | ICD-10-CM | POA: Diagnosis not present

## 2016-06-08 DIAGNOSIS — Z1389 Encounter for screening for other disorder: Secondary | ICD-10-CM | POA: Diagnosis not present

## 2016-08-17 DIAGNOSIS — L570 Actinic keratosis: Secondary | ICD-10-CM | POA: Diagnosis not present

## 2016-08-17 DIAGNOSIS — L905 Scar conditions and fibrosis of skin: Secondary | ICD-10-CM | POA: Diagnosis not present

## 2016-08-17 DIAGNOSIS — Z85828 Personal history of other malignant neoplasm of skin: Secondary | ICD-10-CM | POA: Diagnosis not present

## 2016-10-05 DIAGNOSIS — H26491 Other secondary cataract, right eye: Secondary | ICD-10-CM | POA: Diagnosis not present

## 2016-10-05 DIAGNOSIS — H35372 Puckering of macula, left eye: Secondary | ICD-10-CM | POA: Diagnosis not present

## 2016-10-05 DIAGNOSIS — H52203 Unspecified astigmatism, bilateral: Secondary | ICD-10-CM | POA: Diagnosis not present

## 2016-10-21 DIAGNOSIS — Z23 Encounter for immunization: Secondary | ICD-10-CM | POA: Diagnosis not present

## 2016-11-29 ENCOUNTER — Other Ambulatory Visit: Payer: Self-pay | Admitting: Dermatology

## 2016-11-29 DIAGNOSIS — C44212 Basal cell carcinoma of skin of right ear and external auricular canal: Secondary | ICD-10-CM | POA: Diagnosis not present

## 2016-12-07 DIAGNOSIS — J449 Chronic obstructive pulmonary disease, unspecified: Secondary | ICD-10-CM | POA: Diagnosis not present

## 2016-12-07 DIAGNOSIS — M1990 Unspecified osteoarthritis, unspecified site: Secondary | ICD-10-CM | POA: Diagnosis not present

## 2016-12-07 DIAGNOSIS — K269 Duodenal ulcer, unspecified as acute or chronic, without hemorrhage or perforation: Secondary | ICD-10-CM | POA: Diagnosis not present

## 2016-12-07 DIAGNOSIS — J302 Other seasonal allergic rhinitis: Secondary | ICD-10-CM | POA: Diagnosis not present

## 2016-12-07 DIAGNOSIS — I10 Essential (primary) hypertension: Secondary | ICD-10-CM | POA: Diagnosis not present

## 2016-12-07 DIAGNOSIS — N183 Chronic kidney disease, stage 3 (moderate): Secondary | ICD-10-CM | POA: Diagnosis not present

## 2016-12-07 DIAGNOSIS — L989 Disorder of the skin and subcutaneous tissue, unspecified: Secondary | ICD-10-CM | POA: Diagnosis not present

## 2016-12-07 DIAGNOSIS — Z6826 Body mass index (BMI) 26.0-26.9, adult: Secondary | ICD-10-CM | POA: Diagnosis not present

## 2016-12-07 DIAGNOSIS — D509 Iron deficiency anemia, unspecified: Secondary | ICD-10-CM | POA: Diagnosis not present

## 2016-12-27 DIAGNOSIS — J0100 Acute maxillary sinusitis, unspecified: Secondary | ICD-10-CM | POA: Diagnosis not present

## 2017-01-10 DIAGNOSIS — L905 Scar conditions and fibrosis of skin: Secondary | ICD-10-CM | POA: Diagnosis not present

## 2017-06-07 DIAGNOSIS — R82998 Other abnormal findings in urine: Secondary | ICD-10-CM | POA: Diagnosis not present

## 2017-06-07 DIAGNOSIS — Z125 Encounter for screening for malignant neoplasm of prostate: Secondary | ICD-10-CM | POA: Diagnosis not present

## 2017-06-07 DIAGNOSIS — I10 Essential (primary) hypertension: Secondary | ICD-10-CM | POA: Diagnosis not present

## 2017-06-07 DIAGNOSIS — E7849 Other hyperlipidemia: Secondary | ICD-10-CM | POA: Diagnosis not present

## 2017-06-14 DIAGNOSIS — E7849 Other hyperlipidemia: Secondary | ICD-10-CM | POA: Diagnosis not present

## 2017-06-14 DIAGNOSIS — N183 Chronic kidney disease, stage 3 (moderate): Secondary | ICD-10-CM | POA: Diagnosis not present

## 2017-06-14 DIAGNOSIS — Z1389 Encounter for screening for other disorder: Secondary | ICD-10-CM | POA: Diagnosis not present

## 2017-06-14 DIAGNOSIS — C679 Malignant neoplasm of bladder, unspecified: Secondary | ICD-10-CM | POA: Diagnosis not present

## 2017-06-14 DIAGNOSIS — M1990 Unspecified osteoarthritis, unspecified site: Secondary | ICD-10-CM | POA: Diagnosis not present

## 2017-06-14 DIAGNOSIS — I10 Essential (primary) hypertension: Secondary | ICD-10-CM | POA: Diagnosis not present

## 2017-06-14 DIAGNOSIS — L989 Disorder of the skin and subcutaneous tissue, unspecified: Secondary | ICD-10-CM | POA: Diagnosis not present

## 2017-06-14 DIAGNOSIS — J449 Chronic obstructive pulmonary disease, unspecified: Secondary | ICD-10-CM | POA: Diagnosis not present

## 2017-06-14 DIAGNOSIS — Z0000 Encounter for general adult medical examination without abnormal findings: Secondary | ICD-10-CM | POA: Diagnosis not present

## 2017-06-14 DIAGNOSIS — J302 Other seasonal allergic rhinitis: Secondary | ICD-10-CM | POA: Diagnosis not present

## 2017-06-14 DIAGNOSIS — K269 Duodenal ulcer, unspecified as acute or chronic, without hemorrhage or perforation: Secondary | ICD-10-CM | POA: Diagnosis not present

## 2017-06-14 DIAGNOSIS — K5730 Diverticulosis of large intestine without perforation or abscess without bleeding: Secondary | ICD-10-CM | POA: Diagnosis not present

## 2017-06-23 DIAGNOSIS — Z1212 Encounter for screening for malignant neoplasm of rectum: Secondary | ICD-10-CM | POA: Diagnosis not present

## 2017-07-11 DIAGNOSIS — L570 Actinic keratosis: Secondary | ICD-10-CM | POA: Diagnosis not present

## 2017-07-11 DIAGNOSIS — L821 Other seborrheic keratosis: Secondary | ICD-10-CM | POA: Diagnosis not present

## 2017-07-11 DIAGNOSIS — D485 Neoplasm of uncertain behavior of skin: Secondary | ICD-10-CM | POA: Diagnosis not present

## 2017-07-11 DIAGNOSIS — D229 Melanocytic nevi, unspecified: Secondary | ICD-10-CM | POA: Diagnosis not present

## 2017-07-11 DIAGNOSIS — D1801 Hemangioma of skin and subcutaneous tissue: Secondary | ICD-10-CM | POA: Diagnosis not present

## 2017-07-11 DIAGNOSIS — C44519 Basal cell carcinoma of skin of other part of trunk: Secondary | ICD-10-CM | POA: Diagnosis not present

## 2017-08-08 DIAGNOSIS — C4441 Basal cell carcinoma of skin of scalp and neck: Secondary | ICD-10-CM | POA: Diagnosis not present

## 2017-08-08 DIAGNOSIS — C44519 Basal cell carcinoma of skin of other part of trunk: Secondary | ICD-10-CM | POA: Diagnosis not present

## 2017-10-11 DIAGNOSIS — H35372 Puckering of macula, left eye: Secondary | ICD-10-CM | POA: Diagnosis not present

## 2017-10-11 DIAGNOSIS — H52203 Unspecified astigmatism, bilateral: Secondary | ICD-10-CM | POA: Diagnosis not present

## 2017-10-11 DIAGNOSIS — Z961 Presence of intraocular lens: Secondary | ICD-10-CM | POA: Diagnosis not present

## 2017-10-24 DIAGNOSIS — Z23 Encounter for immunization: Secondary | ICD-10-CM | POA: Diagnosis not present

## 2017-12-13 DIAGNOSIS — K269 Duodenal ulcer, unspecified as acute or chronic, without hemorrhage or perforation: Secondary | ICD-10-CM | POA: Diagnosis not present

## 2017-12-13 DIAGNOSIS — I10 Essential (primary) hypertension: Secondary | ICD-10-CM | POA: Diagnosis not present

## 2017-12-13 DIAGNOSIS — E7849 Other hyperlipidemia: Secondary | ICD-10-CM | POA: Diagnosis not present

## 2017-12-13 DIAGNOSIS — R0609 Other forms of dyspnea: Secondary | ICD-10-CM | POA: Diagnosis not present

## 2017-12-13 DIAGNOSIS — N183 Chronic kidney disease, stage 3 (moderate): Secondary | ICD-10-CM | POA: Diagnosis not present

## 2017-12-13 DIAGNOSIS — J449 Chronic obstructive pulmonary disease, unspecified: Secondary | ICD-10-CM | POA: Diagnosis not present

## 2017-12-13 DIAGNOSIS — R5383 Other fatigue: Secondary | ICD-10-CM | POA: Diagnosis not present

## 2017-12-13 DIAGNOSIS — C679 Malignant neoplasm of bladder, unspecified: Secondary | ICD-10-CM | POA: Diagnosis not present

## 2017-12-13 DIAGNOSIS — M1990 Unspecified osteoarthritis, unspecified site: Secondary | ICD-10-CM | POA: Diagnosis not present

## 2017-12-26 DIAGNOSIS — C44319 Basal cell carcinoma of skin of other parts of face: Secondary | ICD-10-CM | POA: Diagnosis not present

## 2017-12-26 DIAGNOSIS — L905 Scar conditions and fibrosis of skin: Secondary | ICD-10-CM | POA: Diagnosis not present

## 2017-12-26 DIAGNOSIS — Z85828 Personal history of other malignant neoplasm of skin: Secondary | ICD-10-CM | POA: Diagnosis not present

## 2017-12-26 DIAGNOSIS — D485 Neoplasm of uncertain behavior of skin: Secondary | ICD-10-CM | POA: Diagnosis not present

## 2017-12-27 DIAGNOSIS — I10 Essential (primary) hypertension: Secondary | ICD-10-CM | POA: Diagnosis not present

## 2017-12-27 DIAGNOSIS — Z6824 Body mass index (BMI) 24.0-24.9, adult: Secondary | ICD-10-CM | POA: Diagnosis not present

## 2017-12-27 DIAGNOSIS — J209 Acute bronchitis, unspecified: Secondary | ICD-10-CM | POA: Diagnosis not present

## 2017-12-27 DIAGNOSIS — J449 Chronic obstructive pulmonary disease, unspecified: Secondary | ICD-10-CM | POA: Diagnosis not present

## 2017-12-27 DIAGNOSIS — R0609 Other forms of dyspnea: Secondary | ICD-10-CM | POA: Diagnosis not present

## 2018-03-13 DIAGNOSIS — C44319 Basal cell carcinoma of skin of other parts of face: Secondary | ICD-10-CM | POA: Diagnosis not present

## 2018-04-12 DIAGNOSIS — H5340 Unspecified visual field defects: Secondary | ICD-10-CM | POA: Diagnosis not present

## 2018-06-09 DIAGNOSIS — R82998 Other abnormal findings in urine: Secondary | ICD-10-CM | POA: Diagnosis not present

## 2018-06-09 DIAGNOSIS — I10 Essential (primary) hypertension: Secondary | ICD-10-CM | POA: Diagnosis not present

## 2018-06-09 DIAGNOSIS — Z125 Encounter for screening for malignant neoplasm of prostate: Secondary | ICD-10-CM | POA: Diagnosis not present

## 2018-06-09 DIAGNOSIS — E7849 Other hyperlipidemia: Secondary | ICD-10-CM | POA: Diagnosis not present

## 2018-06-16 DIAGNOSIS — K5730 Diverticulosis of large intestine without perforation or abscess without bleeding: Secondary | ICD-10-CM | POA: Diagnosis not present

## 2018-06-16 DIAGNOSIS — N183 Chronic kidney disease, stage 3 (moderate): Secondary | ICD-10-CM | POA: Diagnosis not present

## 2018-06-16 DIAGNOSIS — Z0000 Encounter for general adult medical examination without abnormal findings: Secondary | ICD-10-CM | POA: Diagnosis not present

## 2018-06-16 DIAGNOSIS — Z1331 Encounter for screening for depression: Secondary | ICD-10-CM | POA: Diagnosis not present

## 2018-06-16 DIAGNOSIS — C679 Malignant neoplasm of bladder, unspecified: Secondary | ICD-10-CM | POA: Diagnosis not present

## 2018-06-16 DIAGNOSIS — I129 Hypertensive chronic kidney disease with stage 1 through stage 4 chronic kidney disease, or unspecified chronic kidney disease: Secondary | ICD-10-CM | POA: Diagnosis not present

## 2018-06-16 DIAGNOSIS — J449 Chronic obstructive pulmonary disease, unspecified: Secondary | ICD-10-CM | POA: Diagnosis not present

## 2018-06-16 DIAGNOSIS — M1990 Unspecified osteoarthritis, unspecified site: Secondary | ICD-10-CM | POA: Diagnosis not present

## 2018-06-16 DIAGNOSIS — E785 Hyperlipidemia, unspecified: Secondary | ICD-10-CM | POA: Diagnosis not present

## 2018-06-16 DIAGNOSIS — L989 Disorder of the skin and subcutaneous tissue, unspecified: Secondary | ICD-10-CM | POA: Diagnosis not present

## 2018-06-16 DIAGNOSIS — J302 Other seasonal allergic rhinitis: Secondary | ICD-10-CM | POA: Diagnosis not present

## 2018-06-16 DIAGNOSIS — K269 Duodenal ulcer, unspecified as acute or chronic, without hemorrhage or perforation: Secondary | ICD-10-CM | POA: Diagnosis not present

## 2018-06-28 DIAGNOSIS — L905 Scar conditions and fibrosis of skin: Secondary | ICD-10-CM | POA: Diagnosis not present

## 2018-06-28 DIAGNOSIS — Z85828 Personal history of other malignant neoplasm of skin: Secondary | ICD-10-CM | POA: Diagnosis not present

## 2018-06-28 DIAGNOSIS — D485 Neoplasm of uncertain behavior of skin: Secondary | ICD-10-CM | POA: Diagnosis not present

## 2018-06-29 DIAGNOSIS — C44319 Basal cell carcinoma of skin of other parts of face: Secondary | ICD-10-CM | POA: Diagnosis not present

## 2018-09-12 DIAGNOSIS — D485 Neoplasm of uncertain behavior of skin: Secondary | ICD-10-CM | POA: Diagnosis not present

## 2018-09-12 DIAGNOSIS — L570 Actinic keratosis: Secondary | ICD-10-CM | POA: Diagnosis not present

## 2018-09-26 DIAGNOSIS — C4441 Basal cell carcinoma of skin of scalp and neck: Secondary | ICD-10-CM | POA: Diagnosis not present

## 2018-10-18 DIAGNOSIS — H52203 Unspecified astigmatism, bilateral: Secondary | ICD-10-CM | POA: Diagnosis not present

## 2018-10-18 DIAGNOSIS — H35372 Puckering of macula, left eye: Secondary | ICD-10-CM | POA: Diagnosis not present

## 2018-10-18 DIAGNOSIS — Z961 Presence of intraocular lens: Secondary | ICD-10-CM | POA: Diagnosis not present

## 2018-10-25 DIAGNOSIS — C44212 Basal cell carcinoma of skin of right ear and external auricular canal: Secondary | ICD-10-CM | POA: Diagnosis not present

## 2018-11-06 DIAGNOSIS — Z23 Encounter for immunization: Secondary | ICD-10-CM | POA: Diagnosis not present

## 2018-11-16 DIAGNOSIS — K2900 Acute gastritis without bleeding: Secondary | ICD-10-CM | POA: Diagnosis not present

## 2018-11-16 DIAGNOSIS — K269 Duodenal ulcer, unspecified as acute or chronic, without hemorrhage or perforation: Secondary | ICD-10-CM | POA: Diagnosis not present

## 2018-12-21 DIAGNOSIS — R0609 Other forms of dyspnea: Secondary | ICD-10-CM | POA: Diagnosis not present

## 2018-12-21 DIAGNOSIS — C679 Malignant neoplasm of bladder, unspecified: Secondary | ICD-10-CM | POA: Diagnosis not present

## 2018-12-21 DIAGNOSIS — K269 Duodenal ulcer, unspecified as acute or chronic, without hemorrhage or perforation: Secondary | ICD-10-CM | POA: Diagnosis not present

## 2018-12-21 DIAGNOSIS — R634 Abnormal weight loss: Secondary | ICD-10-CM | POA: Diagnosis not present

## 2018-12-21 DIAGNOSIS — J449 Chronic obstructive pulmonary disease, unspecified: Secondary | ICD-10-CM | POA: Diagnosis not present

## 2018-12-21 DIAGNOSIS — E785 Hyperlipidemia, unspecified: Secondary | ICD-10-CM | POA: Diagnosis not present

## 2018-12-21 DIAGNOSIS — I129 Hypertensive chronic kidney disease with stage 1 through stage 4 chronic kidney disease, or unspecified chronic kidney disease: Secondary | ICD-10-CM | POA: Diagnosis not present

## 2018-12-21 DIAGNOSIS — N183 Chronic kidney disease, stage 3 unspecified: Secondary | ICD-10-CM | POA: Diagnosis not present

## 2018-12-21 DIAGNOSIS — M1990 Unspecified osteoarthritis, unspecified site: Secondary | ICD-10-CM | POA: Diagnosis not present

## 2018-12-21 DIAGNOSIS — K2900 Acute gastritis without bleeding: Secondary | ICD-10-CM | POA: Diagnosis not present

## 2019-01-08 DIAGNOSIS — E7849 Other hyperlipidemia: Secondary | ICD-10-CM | POA: Diagnosis not present

## 2019-03-02 DIAGNOSIS — K2900 Acute gastritis without bleeding: Secondary | ICD-10-CM | POA: Diagnosis not present

## 2019-03-02 DIAGNOSIS — C44310 Basal cell carcinoma of skin of unspecified parts of face: Secondary | ICD-10-CM | POA: Diagnosis not present

## 2019-03-02 DIAGNOSIS — R0609 Other forms of dyspnea: Secondary | ICD-10-CM | POA: Diagnosis not present

## 2019-03-02 DIAGNOSIS — C679 Malignant neoplasm of bladder, unspecified: Secondary | ICD-10-CM | POA: Diagnosis not present

## 2019-03-02 DIAGNOSIS — N1832 Chronic kidney disease, stage 3b: Secondary | ICD-10-CM | POA: Diagnosis not present

## 2019-03-02 DIAGNOSIS — R634 Abnormal weight loss: Secondary | ICD-10-CM | POA: Diagnosis not present

## 2019-03-02 DIAGNOSIS — I129 Hypertensive chronic kidney disease with stage 1 through stage 4 chronic kidney disease, or unspecified chronic kidney disease: Secondary | ICD-10-CM | POA: Diagnosis not present

## 2019-03-02 DIAGNOSIS — J302 Other seasonal allergic rhinitis: Secondary | ICD-10-CM | POA: Diagnosis not present

## 2019-03-22 DIAGNOSIS — Z85828 Personal history of other malignant neoplasm of skin: Secondary | ICD-10-CM | POA: Diagnosis not present

## 2019-03-22 DIAGNOSIS — L819 Disorder of pigmentation, unspecified: Secondary | ICD-10-CM | POA: Diagnosis not present

## 2019-03-22 DIAGNOSIS — L905 Scar conditions and fibrosis of skin: Secondary | ICD-10-CM | POA: Diagnosis not present

## 2019-03-22 DIAGNOSIS — L570 Actinic keratosis: Secondary | ICD-10-CM | POA: Diagnosis not present

## 2019-04-12 DIAGNOSIS — Z85828 Personal history of other malignant neoplasm of skin: Secondary | ICD-10-CM | POA: Diagnosis not present

## 2019-04-12 DIAGNOSIS — L905 Scar conditions and fibrosis of skin: Secondary | ICD-10-CM | POA: Diagnosis not present

## 2019-04-12 DIAGNOSIS — L570 Actinic keratosis: Secondary | ICD-10-CM | POA: Diagnosis not present

## 2019-04-12 DIAGNOSIS — L819 Disorder of pigmentation, unspecified: Secondary | ICD-10-CM | POA: Diagnosis not present

## 2019-05-23 DIAGNOSIS — L570 Actinic keratosis: Secondary | ICD-10-CM | POA: Diagnosis not present

## 2019-05-23 DIAGNOSIS — L905 Scar conditions and fibrosis of skin: Secondary | ICD-10-CM | POA: Diagnosis not present

## 2019-05-23 DIAGNOSIS — L819 Disorder of pigmentation, unspecified: Secondary | ICD-10-CM | POA: Diagnosis not present

## 2019-05-23 DIAGNOSIS — Z85828 Personal history of other malignant neoplasm of skin: Secondary | ICD-10-CM | POA: Diagnosis not present

## 2019-06-20 DIAGNOSIS — Z125 Encounter for screening for malignant neoplasm of prostate: Secondary | ICD-10-CM | POA: Diagnosis not present

## 2019-06-20 DIAGNOSIS — E7849 Other hyperlipidemia: Secondary | ICD-10-CM | POA: Diagnosis not present

## 2019-06-27 DIAGNOSIS — R0609 Other forms of dyspnea: Secondary | ICD-10-CM | POA: Diagnosis not present

## 2019-06-27 DIAGNOSIS — I129 Hypertensive chronic kidney disease with stage 1 through stage 4 chronic kidney disease, or unspecified chronic kidney disease: Secondary | ICD-10-CM | POA: Diagnosis not present

## 2019-06-27 DIAGNOSIS — J302 Other seasonal allergic rhinitis: Secondary | ICD-10-CM | POA: Diagnosis not present

## 2019-06-27 DIAGNOSIS — C44310 Basal cell carcinoma of skin of unspecified parts of face: Secondary | ICD-10-CM | POA: Diagnosis not present

## 2019-06-27 DIAGNOSIS — J449 Chronic obstructive pulmonary disease, unspecified: Secondary | ICD-10-CM | POA: Diagnosis not present

## 2019-06-27 DIAGNOSIS — M1990 Unspecified osteoarthritis, unspecified site: Secondary | ICD-10-CM | POA: Diagnosis not present

## 2019-06-27 DIAGNOSIS — Z1331 Encounter for screening for depression: Secondary | ICD-10-CM | POA: Diagnosis not present

## 2019-06-27 DIAGNOSIS — E785 Hyperlipidemia, unspecified: Secondary | ICD-10-CM | POA: Diagnosis not present

## 2019-06-27 DIAGNOSIS — N1832 Chronic kidney disease, stage 3b: Secondary | ICD-10-CM | POA: Diagnosis not present

## 2019-06-27 DIAGNOSIS — C679 Malignant neoplasm of bladder, unspecified: Secondary | ICD-10-CM | POA: Diagnosis not present

## 2019-06-27 DIAGNOSIS — R82998 Other abnormal findings in urine: Secondary | ICD-10-CM | POA: Diagnosis not present

## 2019-06-27 DIAGNOSIS — Z0000 Encounter for general adult medical examination without abnormal findings: Secondary | ICD-10-CM | POA: Diagnosis not present

## 2019-06-27 DIAGNOSIS — R634 Abnormal weight loss: Secondary | ICD-10-CM | POA: Diagnosis not present

## 2019-09-17 DIAGNOSIS — R634 Abnormal weight loss: Secondary | ICD-10-CM | POA: Diagnosis not present

## 2019-09-17 DIAGNOSIS — J449 Chronic obstructive pulmonary disease, unspecified: Secondary | ICD-10-CM | POA: Diagnosis not present

## 2019-09-17 DIAGNOSIS — I129 Hypertensive chronic kidney disease with stage 1 through stage 4 chronic kidney disease, or unspecified chronic kidney disease: Secondary | ICD-10-CM | POA: Diagnosis not present

## 2019-09-17 DIAGNOSIS — N1832 Chronic kidney disease, stage 3b: Secondary | ICD-10-CM | POA: Diagnosis not present

## 2019-09-17 DIAGNOSIS — R0609 Other forms of dyspnea: Secondary | ICD-10-CM | POA: Diagnosis not present

## 2019-09-25 DIAGNOSIS — Z85828 Personal history of other malignant neoplasm of skin: Secondary | ICD-10-CM | POA: Diagnosis not present

## 2019-09-25 DIAGNOSIS — D485 Neoplasm of uncertain behavior of skin: Secondary | ICD-10-CM | POA: Diagnosis not present

## 2019-09-25 DIAGNOSIS — L905 Scar conditions and fibrosis of skin: Secondary | ICD-10-CM | POA: Diagnosis not present

## 2019-09-25 DIAGNOSIS — C44319 Basal cell carcinoma of skin of other parts of face: Secondary | ICD-10-CM | POA: Diagnosis not present

## 2019-09-25 DIAGNOSIS — L570 Actinic keratosis: Secondary | ICD-10-CM | POA: Diagnosis not present

## 2019-10-23 DIAGNOSIS — H35372 Puckering of macula, left eye: Secondary | ICD-10-CM | POA: Diagnosis not present

## 2019-10-23 DIAGNOSIS — Z961 Presence of intraocular lens: Secondary | ICD-10-CM | POA: Diagnosis not present

## 2019-10-23 DIAGNOSIS — H52203 Unspecified astigmatism, bilateral: Secondary | ICD-10-CM | POA: Diagnosis not present

## 2019-10-29 DIAGNOSIS — Z23 Encounter for immunization: Secondary | ICD-10-CM | POA: Diagnosis not present

## 2019-10-31 DIAGNOSIS — C44319 Basal cell carcinoma of skin of other parts of face: Secondary | ICD-10-CM | POA: Diagnosis not present

## 2019-11-07 DIAGNOSIS — L905 Scar conditions and fibrosis of skin: Secondary | ICD-10-CM | POA: Diagnosis not present

## 2019-11-07 DIAGNOSIS — C44319 Basal cell carcinoma of skin of other parts of face: Secondary | ICD-10-CM | POA: Diagnosis not present

## 2019-11-07 DIAGNOSIS — L570 Actinic keratosis: Secondary | ICD-10-CM | POA: Diagnosis not present

## 2019-12-04 DIAGNOSIS — F32A Depression, unspecified: Secondary | ICD-10-CM | POA: Diagnosis not present

## 2019-12-04 DIAGNOSIS — I129 Hypertensive chronic kidney disease with stage 1 through stage 4 chronic kidney disease, or unspecified chronic kidney disease: Secondary | ICD-10-CM | POA: Diagnosis not present

## 2019-12-04 DIAGNOSIS — N1832 Chronic kidney disease, stage 3b: Secondary | ICD-10-CM | POA: Diagnosis not present

## 2019-12-04 DIAGNOSIS — J449 Chronic obstructive pulmonary disease, unspecified: Secondary | ICD-10-CM | POA: Diagnosis not present

## 2019-12-04 DIAGNOSIS — R0602 Shortness of breath: Secondary | ICD-10-CM | POA: Diagnosis not present

## 2019-12-05 DIAGNOSIS — Z23 Encounter for immunization: Secondary | ICD-10-CM | POA: Diagnosis not present

## 2019-12-18 DIAGNOSIS — M25511 Pain in right shoulder: Secondary | ICD-10-CM | POA: Diagnosis not present

## 2019-12-18 DIAGNOSIS — M25811 Other specified joint disorders, right shoulder: Secondary | ICD-10-CM | POA: Diagnosis not present

## 2019-12-25 DIAGNOSIS — M1990 Unspecified osteoarthritis, unspecified site: Secondary | ICD-10-CM | POA: Diagnosis not present

## 2019-12-25 DIAGNOSIS — D72829 Elevated white blood cell count, unspecified: Secondary | ICD-10-CM | POA: Diagnosis not present

## 2019-12-25 DIAGNOSIS — J449 Chronic obstructive pulmonary disease, unspecified: Secondary | ICD-10-CM | POA: Diagnosis not present

## 2019-12-25 DIAGNOSIS — F32A Depression, unspecified: Secondary | ICD-10-CM | POA: Diagnosis not present

## 2019-12-25 DIAGNOSIS — N1832 Chronic kidney disease, stage 3b: Secondary | ICD-10-CM | POA: Diagnosis not present

## 2019-12-25 DIAGNOSIS — I129 Hypertensive chronic kidney disease with stage 1 through stage 4 chronic kidney disease, or unspecified chronic kidney disease: Secondary | ICD-10-CM | POA: Diagnosis not present

## 2019-12-25 DIAGNOSIS — C679 Malignant neoplasm of bladder, unspecified: Secondary | ICD-10-CM | POA: Diagnosis not present

## 2019-12-25 DIAGNOSIS — R0602 Shortness of breath: Secondary | ICD-10-CM | POA: Diagnosis not present

## 2020-02-10 ENCOUNTER — Encounter (HOSPITAL_COMMUNITY): Payer: Self-pay | Admitting: Emergency Medicine

## 2020-02-10 ENCOUNTER — Other Ambulatory Visit: Payer: Self-pay

## 2020-02-10 ENCOUNTER — Emergency Department (HOSPITAL_COMMUNITY): Payer: Medicare Other

## 2020-02-10 ENCOUNTER — Inpatient Hospital Stay (HOSPITAL_COMMUNITY)
Admission: EM | Admit: 2020-02-10 | Discharge: 2020-02-12 | DRG: 190 | Disposition: A | Discharge status: Home / Self Care | Payer: Medicare Other | Attending: Internal Medicine | Admitting: Internal Medicine

## 2020-02-10 DIAGNOSIS — I700 Atherosclerosis of aorta: Secondary | ICD-10-CM | POA: Diagnosis present

## 2020-02-10 DIAGNOSIS — Z87891 Personal history of nicotine dependence: Secondary | ICD-10-CM

## 2020-02-10 DIAGNOSIS — K219 Gastro-esophageal reflux disease without esophagitis: Secondary | ICD-10-CM | POA: Diagnosis present

## 2020-02-10 DIAGNOSIS — E785 Hyperlipidemia, unspecified: Secondary | ICD-10-CM

## 2020-02-10 DIAGNOSIS — Z8551 Personal history of malignant neoplasm of bladder: Secondary | ICD-10-CM | POA: Diagnosis not present

## 2020-02-10 DIAGNOSIS — Z66 Do not resuscitate: Secondary | ICD-10-CM | POA: Diagnosis present

## 2020-02-10 DIAGNOSIS — J9601 Acute respiratory failure with hypoxia: Secondary | ICD-10-CM | POA: Diagnosis present

## 2020-02-10 DIAGNOSIS — I2510 Atherosclerotic heart disease of native coronary artery without angina pectoris: Secondary | ICD-10-CM | POA: Diagnosis present

## 2020-02-10 DIAGNOSIS — J449 Chronic obstructive pulmonary disease, unspecified: Secondary | ICD-10-CM | POA: Diagnosis present

## 2020-02-10 DIAGNOSIS — J432 Centrilobular emphysema: Secondary | ICD-10-CM | POA: Diagnosis present

## 2020-02-10 DIAGNOSIS — R0609 Other forms of dyspnea: Secondary | ICD-10-CM

## 2020-02-10 DIAGNOSIS — I10 Essential (primary) hypertension: Secondary | ICD-10-CM | POA: Diagnosis present

## 2020-02-10 DIAGNOSIS — R0600 Dyspnea, unspecified: Secondary | ICD-10-CM | POA: Diagnosis not present

## 2020-02-10 DIAGNOSIS — J441 Chronic obstructive pulmonary disease with (acute) exacerbation: Secondary | ICD-10-CM

## 2020-02-10 DIAGNOSIS — R059 Cough, unspecified: Secondary | ICD-10-CM | POA: Diagnosis not present

## 2020-02-10 DIAGNOSIS — J438 Other emphysema: Secondary | ICD-10-CM | POA: Diagnosis present

## 2020-02-10 DIAGNOSIS — R079 Chest pain, unspecified: Secondary | ICD-10-CM | POA: Diagnosis not present

## 2020-02-10 DIAGNOSIS — R0602 Shortness of breath: Secondary | ICD-10-CM | POA: Diagnosis not present

## 2020-02-10 DIAGNOSIS — Z20822 Contact with and (suspected) exposure to covid-19: Secondary | ICD-10-CM | POA: Diagnosis present

## 2020-02-10 DIAGNOSIS — I2699 Other pulmonary embolism without acute cor pulmonale: Secondary | ICD-10-CM | POA: Diagnosis not present

## 2020-02-10 HISTORY — DX: Other forms of dyspnea: R06.09

## 2020-02-10 HISTORY — DX: Dyspnea, unspecified: R06.00

## 2020-02-10 HISTORY — DX: Essential (primary) hypertension: I10

## 2020-02-10 HISTORY — DX: Atherosclerosis of aorta: I70.0

## 2020-02-10 HISTORY — DX: Chronic obstructive pulmonary disease with (acute) exacerbation: J44.1

## 2020-02-10 HISTORY — DX: Gastro-esophageal reflux disease without esophagitis: K21.9

## 2020-02-10 HISTORY — DX: Hyperlipidemia, unspecified: E78.5

## 2020-02-10 LAB — RESP PANEL BY RT-PCR (FLU A&B, COVID) ARPGX2
Influenza A by PCR: NEGATIVE
Influenza B by PCR: NEGATIVE
SARS Coronavirus 2 by RT PCR: NEGATIVE

## 2020-02-10 LAB — CBC
HCT: 42 % (ref 39.0–52.0)
Hemoglobin: 13.2 g/dL (ref 13.0–17.0)
MCH: 29 pg (ref 26.0–34.0)
MCHC: 31.4 g/dL (ref 30.0–36.0)
MCV: 92.3 fL (ref 80.0–100.0)
Platelets: 282 10*3/uL (ref 150–400)
RBC: 4.55 MIL/uL (ref 4.22–5.81)
RDW: 13.7 % (ref 11.5–15.5)
WBC: 13.8 10*3/uL — ABNORMAL HIGH (ref 4.0–10.5)
nRBC: 0 % (ref 0.0–0.2)

## 2020-02-10 LAB — LACTIC ACID, PLASMA: Lactic Acid, Venous: 1.3 mmol/L (ref 0.5–1.9)

## 2020-02-10 LAB — BASIC METABOLIC PANEL
Anion gap: 12 (ref 5–15)
BUN: 26 mg/dL — ABNORMAL HIGH (ref 8–23)
CO2: 25 mmol/L (ref 22–32)
Calcium: 9.3 mg/dL (ref 8.9–10.3)
Chloride: 103 mmol/L (ref 98–111)
Creatinine, Ser: 1.32 mg/dL — ABNORMAL HIGH (ref 0.61–1.24)
GFR, Estimated: 52 mL/min — ABNORMAL LOW (ref >60)
Glucose, Bld: 124 mg/dL — ABNORMAL HIGH (ref 70–99)
Potassium: 4.4 mmol/L (ref 3.5–5.1)
Sodium: 140 mmol/L (ref 135–145)

## 2020-02-10 LAB — TROPONIN I (HIGH SENSITIVITY)
Troponin I (High Sensitivity): 10 ng/L (ref <18)
Troponin I (High Sensitivity): 11 ng/L (ref <18)

## 2020-02-10 LAB — BRAIN NATRIURETIC PEPTIDE: B Natriuretic Peptide: 199.8 pg/mL — ABNORMAL HIGH (ref 0.0–100.0)

## 2020-02-10 MED ORDER — METOPROLOL TARTRATE 5 MG/5ML IV SOLN: 5.0000 mg | Freq: Four times a day (QID) | INTRAVENOUS | Status: DC | PRN

## 2020-02-10 MED ORDER — METHYLPREDNISOLONE SODIUM SUCC 40 MG IJ SOLR
40.0000 mg | Freq: Four times a day (QID) | INTRAMUSCULAR | Status: AC
  Administered 2020-02-10 – 2020-02-11 (×4): 40 mg via INTRAVENOUS
  Filled 2020-02-10 (×4): qty 1

## 2020-02-10 MED ORDER — SIMVASTATIN 40 MG PO TABS
40.0000 mg | ORAL_TABLET | Freq: Every day | ORAL | Status: DC
  Administered 2020-02-10 – 2020-02-11 (×2): 40 mg via ORAL
  Filled 2020-02-10 (×2): qty 1

## 2020-02-10 MED ORDER — AEROCHAMBER Z-STAT PLUS/MEDIUM MISC
1.0000 | Freq: Once | Status: AC
  Administered 2020-02-10: 1
  Filled 2020-02-10: qty 1

## 2020-02-10 MED ORDER — PREDNISONE 20 MG PO TABS
40.0000 mg | ORAL_TABLET | Freq: Every day | ORAL | Status: DC
  Administered 2020-02-12: 40 mg via ORAL
  Filled 2020-02-10 (×2): qty 2

## 2020-02-10 MED ORDER — ALBUTEROL SULFATE (2.5 MG/3ML) 0.083% IN NEBU: 2.5000 mg | INHALATION_SOLUTION | RESPIRATORY_TRACT | Status: DC | PRN

## 2020-02-10 MED ORDER — DOXYCYCLINE HYCLATE 100 MG PO TABS
100.0000 mg | ORAL_TABLET | Freq: Two times a day (BID) | ORAL | Status: DC
  Administered 2020-02-10 – 2020-02-12 (×4): 100 mg via ORAL
  Filled 2020-02-10 (×4): qty 1

## 2020-02-10 MED ORDER — SODIUM CHLORIDE 0.9 % IV BOLUS
500.0000 mL | Freq: Once | INTRAVENOUS | Status: AC
  Administered 2020-02-10: 500 mL via INTRAVENOUS

## 2020-02-10 MED ORDER — IPRATROPIUM-ALBUTEROL 0.5-2.5 (3) MG/3ML IN SOLN: 3.0000 mL | Freq: Four times a day (QID) | RESPIRATORY_TRACT | Status: DC

## 2020-02-10 MED ORDER — PANTOPRAZOLE SODIUM 40 MG PO TBEC
40.0000 mg | DELAYED_RELEASE_TABLET | Freq: Two times a day (BID) | ORAL | Status: DC
  Administered 2020-02-10 – 2020-02-12 (×4): 40 mg via ORAL
  Filled 2020-02-10 (×4): qty 1

## 2020-02-10 MED ORDER — AMLODIPINE BESYLATE 5 MG PO TABS
5.0000 mg | ORAL_TABLET | Freq: Every day | ORAL | Status: DC
  Administered 2020-02-11 – 2020-02-12 (×2): 5 mg via ORAL
  Filled 2020-02-10 (×2): qty 1

## 2020-02-10 MED ORDER — IPRATROPIUM-ALBUTEROL 0.5-2.5 (3) MG/3ML IN SOLN
3.0000 mL | Freq: Four times a day (QID) | RESPIRATORY_TRACT | Status: DC
  Administered 2020-02-11: 3 mL via RESPIRATORY_TRACT
  Filled 2020-02-10: qty 3

## 2020-02-10 MED ORDER — ACETAMINOPHEN 650 MG RE SUPP: 650.0000 mg | Freq: Four times a day (QID) | RECTAL | Status: DC | PRN

## 2020-02-10 MED ORDER — ALBUTEROL SULFATE HFA 108 (90 BASE) MCG/ACT IN AERS
6.0000 | INHALATION_SPRAY | Freq: Once | RESPIRATORY_TRACT | Status: AC
  Administered 2020-02-10: 6 via RESPIRATORY_TRACT
  Filled 2020-02-10: qty 6.7

## 2020-02-10 MED ORDER — ACETAMINOPHEN 325 MG PO TABS: 650.0000 mg | ORAL_TABLET | Freq: Four times a day (QID) | ORAL | Status: DC | PRN

## 2020-02-10 MED ORDER — ENOXAPARIN SODIUM 40 MG/0.4ML ~~LOC~~ SOLN
40.0000 mg | SUBCUTANEOUS | Status: DC
  Administered 2020-02-10 – 2020-02-11 (×2): 40 mg via SUBCUTANEOUS
  Filled 2020-02-10 (×2): qty 0.4

## 2020-02-10 MED ORDER — IPRATROPIUM-ALBUTEROL 0.5-2.5 (3) MG/3ML IN SOLN
3.0000 mL | RESPIRATORY_TRACT | Status: DC
  Administered 2020-02-10: 3 mL via RESPIRATORY_TRACT
  Filled 2020-02-10: qty 3

## 2020-02-10 MED ORDER — IOHEXOL 350 MG/ML SOLN
100.0000 mL | Freq: Once | INTRAVENOUS | Status: AC | PRN
  Administered 2020-02-10: 100 mL via INTRAVENOUS

## 2020-02-10 MED ORDER — METHYLPREDNISOLONE SODIUM SUCC 125 MG IJ SOLR
125.0000 mg | Freq: Once | INTRAMUSCULAR | Status: AC
  Administered 2020-02-10: 125 mg via INTRAVENOUS
  Filled 2020-02-10: qty 2

## 2020-02-10 NOTE — ED Notes (Signed)
Placed pt on 2L [place], O2 up to 93%.

## 2020-02-10 NOTE — ED Notes (Signed)
Patient transferred to hospital bed for comfort

## 2020-02-10 NOTE — H&P (Signed)
History and Physical        Hospital Admission Note Date: 02/10/2020  Patient name: Gerald Valdez Medical record number: [id number] Date of birth: 1928/03/10 Age: 87 y.o. Gender: male  PCP: Prince Solian, MD  Patient coming from: Home Lives with: Wife At baseline, ambulates: Independently  Chief Complaint    Chief Complaint  Patient presents with  . Shortness of Breath      HPI:   This is a very pleasant 87 year old male who has been vaccinated against COVID-19 and flu with past medical history of bladder cancer, COPD and emphysema, Hypertension, hyperlipidemia, GERD who presented to the ED from urgent care with complaints of productive cough and shortness of breath x1 week.  Patient states that for the past 5 weeks or so he has had significant dyspnea on exertion without chest pain or palpitations which is limiting his ADLs.  He works regularly with his hands and on cars and as of recently he has been unable to do this, which is very unlike him.  Over the past week he has had significant decline in his respiratory status and is much more short of breath, difficulty coughing up sputum with some wheezing but without fever.  He went to the urgent care today for evaluation and was found to be hypoxic and was sent to the ED.  He denies any known cardiac history.   ED Course: Afebrile, tachycardic, tachypneic,  hypertensive, hypoxic (SpO2 87% on room air) placed on 2 L/min. Notable Labs: Sodium 140, K4.4, glucose 124, BUN 26, creatinine 1.32, BNP 199, WBC 13.8, Hb 13.2, platelets 282, lactic acid 1.3, COVID-19 and flu negative. Notable Imaging: CXR unremarkable.  CTA chest-no PE, no acute findings but noted healing right sixth rib fracture, diffuse bronchial wall thickening with mild to moderate centrilobular and paraseptal emphysema suggestive of COPD, aortic atherosclerosis and  three-vessel CAD and calcifications of the aortic valve.  Patient received albuterol, Solu-Medrol.    Vitals:   02/10/20 1645 02/10/20 1708  BP: (!) 141/77   Pulse: (!) 120   Resp: (!) 26   Temp:    SpO2: 99% 100%     Review of Systems:  Review of Systems  All other systems reviewed and are negative.   Medical/Social/Family History   Past Medical History: Past Medical History:  Diagnosis Date  . Cancer ([place])    bladder  . Emphysema of lung ([place])   . Essential hypertension 02/10/2020    Past Surgical History:  Procedure Laterality Date  . BLADDER SURGERY    . ESOPHAGOGASTRODUODENOSCOPY N/A 01/30/2014   Procedure: ESOPHAGOGASTRODUODENOSCOPY (EGD);  Surgeon: Inda Castle, MD;  Location: [name];  Service: Endoscopy;  Laterality: N/A;    Medications: Prior to Admission medications   Medication Sig Start Date End Date Taking? Authorizing Provider  albuterol (PROVENTIL HFA;VENTOLIN HFA) 108 (90 BASE) MCG/ACT inhaler Inhale 2 puffs into the lungs every 6 (six) hours as needed for wheezing or shortness of breath.    [provider]  amLODipine (NORVASC) 5 MG tablet Take 5 mg by mouth daily with breakfast.    [provider]  ferrous sulfate 300 (60 FE) MG/5ML syrup Take 5 mLs (300 mg total) by mouth daily.  01/31/14   Reyne Dumas, MD  loratadine (CLARITIN) 10 MG tablet Take 10 mg by mouth daily.    [provider]  Multiple Vitamin (MULTIVITAMIN WITH MINERALS) TABS tablet Take 1 tablet by mouth daily with breakfast.    [provider]  pantoprazole (PROTONIX) 40 MG tablet Take 1 tablet (40 mg total) by mouth 2 (two) times daily. 01/31/14   Reyne Dumas, MD  ramipril (ALTACE) 10 MG capsule Take 10 mg by mouth daily with breakfast.    [provider]  simvastatin (ZOCOR) 40 MG tablet Take 40 mg by mouth at bedtime.    [provider]    Allergies:  No Known Allergies  Social History:  reports that he has quit  smoking. He does not have any smokeless tobacco history on file. No history on file for alcohol use and drug use.  Family History: No family history on file.   Objective   Physical Exam: Blood pressure (!) 141/77, pulse (!) 120, temperature 99.5 F (37.5 C), temperature source Rectal, resp. rate (!) 26, SpO2 100 %.  Physical Exam Vitals and nursing note reviewed.  Constitutional:      General: He is not in acute distress.    Appearance: Normal appearance.  HENT:     Head: Normocephalic and atraumatic.  Eyes:     Conjunctiva/sclera: Conjunctivae normal.  Cardiovascular:     Rate and Rhythm: Regular rhythm. Tachycardia present.  Pulmonary:     Comments: Diffuse expiratory wheeze Some conversational dyspnea Abdominal:     General: Abdomen is flat.     Palpations: Abdomen is soft.  Musculoskeletal:        General: No swelling or tenderness.     Right lower leg: No tenderness. No edema.     Left lower leg: No tenderness. No edema.  Skin:    Coloration: Skin is not jaundiced or pale.  Neurological:     Mental Status: He is alert. Mental status is at baseline.  Psychiatric:        Mood and Affect: Mood normal.        Behavior: Behavior normal.     LABS on Admission: I have personally reviewed all the labs and imaging below    Basic Metabolic Panel: Recent Labs  Lab 02/10/20 1412  NA 140  K 4.4  CL 103  CO2 25  GLUCOSE 124*  BUN 26*  CREATININE 1.32*  CALCIUM 9.3   Liver Function Tests: No results for input(s): AST, ALT, ALKPHOS, BILITOT, PROT, ALBUMIN in the last 168 hours. No results for input(s): LIPASE, AMYLASE in the last 168 hours. No results for input(s): AMMONIA in the last 168 hours. CBC: Recent Labs  Lab 02/10/20 1412  WBC 13.8*  HGB 13.2  HCT 42.0  MCV 92.3  PLT 282   Cardiac Enzymes: No results for input(s): CKTOTAL, CKMB, CKMBINDEX, TROPONINI in the last 168 hours. BNP: Invalid input(s): POCBNP CBG: No results for input(s): GLUCAP in  the last 168 hours.  Radiological Exams on Admission:  DG Chest 2 View  Result Date: 02/10/2020 CLINICAL DATA:  Productive cough, shortness of breath for 5 days. EXAM: CHEST - 2 VIEW COMPARISON:  November 11, 2007 FINDINGS: The heart size and mediastinal contours are within normal limits. Both lungs are clear. The visualized skeletal structures are unremarkable. IMPRESSION: No active cardiopulmonary disease. Electronically Signed   By: Abelardo Diesel M.D.   On: 02/10/2020 14:11   CT Angio Chest PE W and/or Wo Contrast  Result Date:  02/10/2020 CLINICAL DATA:  87 year old male with history of shortness of breath and chest pain. High probability of pulmonary embolism. EXAM: CT ANGIOGRAPHY CHEST WITH CONTRAST TECHNIQUE: Multidetector CT imaging of the chest was performed using the standard protocol during bolus administration of intravenous contrast. Multiplanar CT image reconstructions and MIPs were obtained to evaluate the vascular anatomy. CONTRAST:  OMNIPAQUE IOHEXOL 350 MG/ML SOLN COMPARISON:  No priors. FINDINGS: Cardiovascular: No filling defects within the pulmonary arterial tree to suggest underlying pulmonary embolism. Heart size is normal. There is no significant pericardial fluid, thickening or pericardial calcification. There is aortic atherosclerosis, as well as atherosclerosis of the great vessels of the mediastinum and the coronary arteries, including calcified atherosclerotic plaque in the left main, left anterior descending, left circumflex and right coronary arteries. Thickening calcification of the aortic valve. Mediastinum/Nodes: No pathologically enlarged mediastinal or hilar lymph nodes. Esophagus is unremarkable in appearance. No axillary lymphadenopathy. Lungs/Pleura: No acute consolidative airspace disease. No pleural effusions. No definite suspicious appearing pulmonary nodules or masses are noted. Upper Abdomen: Aortic atherosclerosis. Musculoskeletal: Chronic appearing compression  fracture of T6 with 50% loss of anterior vertebral body height. Healing nondisplaced fracture of the anterolateral aspect of the right sixth rib incidentally noted. There are no aggressive appearing lytic or blastic lesions noted in the visualized portions of the skeleton. Review of the MIP images confirms the above findings. IMPRESSION: 1. No evidence of pulmonary embolism. 2. No acute findings in the thorax to account for the patient's symptoms. 3. Healing fracture of the anterolateral aspect of the right sixth rib. 4. Diffuse bronchial wall thickening with mild to moderate centrilobular and paraseptal emphysema; imaging findings suggestive of underlying COPD. 5. Aortic atherosclerosis, in addition to left main and 3 vessel coronary artery disease. 6. There are calcifications of the aortic valve. Echocardiographic correlation for evaluation of potential valvular dysfunction may be warranted if clinically indicated. Aortic Atherosclerosis (ICD10-I70.0) and Emphysema (ICD10-J43.9). Electronically Signed   By: Trudie Reed M.D.   On: 02/10/2020 15:36      EKG: Sinus tachycardia with nonspecific T wave changes   A & P   Principal Problem:   COPD exacerbation (HCC) Active Problems:   Aortic atherosclerosis (HCC)   Dyspnea on exertion   Essential hypertension   Hyperlipidemia   GERD (gastroesophageal reflux disease)   1. COPD exacerbation a. Increased cough and sputum production, requiring O2 b. Emphysema on CT c. Steroids d. Doxycycline e. Scheduled duo nebs f. As needed albuterol  2. Dyspnea on exertion a. CT scan with aortic atherosclerosis, three-vessel CAD including left main artery as well as calcifications of the aortic valve-echo recommended by radiology b. Check echo c. Check lipid panel d. Patient does not have a cardiologist.  Advised to establish with a cardiologist at discharge  3. Hypertension a. Continue home amlodipine b. Hold  ramipril  4. Hyperlipidemia a. Continue simvastatin  5. GERD a. Continue Protonix  6. Aortic atherosclerosis a. Lipid panel   DVT prophylaxis: Lovenox   Code Status: Prior  Diet: Heart healthy Family Communication: Admission, patients condition and plan of care including tests being ordered have been discussed with the patient who indicates understanding and agrees with the plan and Code Status. Patient's wife at bedside was updated  Disposition Plan: The appropriate patient status for this patient is INPATIENT. Inpatient status is judged to be reasonable and necessary in order to provide the required intensity of service to ensure the patient's safety. The patient's presenting symptoms, physical exam findings, and initial  radiographic and laboratory data in the context of their chronic comorbidities is felt to place them at high risk for further clinical deterioration. Furthermore, it is not anticipated that the patient will be medically stable for discharge from the hospital within 2 midnights of admission. The following factors support the patient status of inpatient.   " The patient's presenting symptoms include dyspnea on exertion, cough and shortness of breath. " The worrisome physical exam findings include wheezing, tachycardia. " The initial radiographic and laboratory data are worrisome because of CAD and COPD. " The chronic co-morbidities include hypertension, hyperlipidemia.   * I certify that at the point of admission it is my clinical judgment that the patient will require inpatient hospital care spanning beyond 2 midnights from the point of admission due to high intensity of service, high risk for further deterioration and high frequency of surveillance required.*   Status is: Inpatient  Remains inpatient appropriate because:IV treatments appropriate due to intensity of illness or inability to take PO and Inpatient level of care appropriate due to severity of  illness   Dispo: The patient is from: Home              Anticipated d/c is to: Home              Anticipated d/c date is: 2 days              Patient currently is not medically stable to d/c.    Consultants  . None  Procedures  . None  Time Spent on Admission: 63 minutes    Harold Hedge, DO [facility]  02/10/2020, 5:26 PM

## 2020-02-10 NOTE — ED Notes (Signed)
Patient placed on 2L nasal cannula.

## 2020-02-10 NOTE — ED Provider Notes (Signed)
[facility]-EMERGENCY DEPT Provider Note   CSN: [id number] Arrival date & time: 02/10/20  1235     History Chief Complaint  Patient presents with  . Shortness of Breath    Gerald Valdez is a 87 y.o. male.  Gerald Valdez is a 87 y.o. male         Past Medical History:  Diagnosis Date  . Cancer (HCC)    bladder  . Emphysema of lung [facility])     Patient Active Problem List   Diagnosis Date Noted  . GI bleed 01/30/2014  . Acute post-hemorrhagic anemia 01/30/2014  . Acute duodenal ulcer with bleeding 01/30/2014    Past Surgical History:  Procedure Laterality Date  . BLADDER SURGERY    . ESOPHAGOGASTRODUODENOSCOPY N/A 01/30/2014   Procedure: ESOPHAGOGASTRODUODENOSCOPY (EGD);  Surgeon: Louis Meckel, MD;  Location: [facility];  Service: Endoscopy;  Laterality: N/A;       No family history on file.  Social History   Tobacco Use  . Smoking status: Former Smoker    Home Medications Prior to Admission medications   Medication Sig Start Date End Date Taking? Authorizing Provider  albuterol (PROVENTIL HFA;VENTOLIN HFA) 108 (90 BASE) MCG/ACT inhaler Inhale 2 puffs into the lungs every 6 (six) hours as needed for wheezing or shortness of breath.    [provider]  amLODipine (NORVASC) 5 MG tablet Take 5 mg by mouth daily with breakfast.    [provider]  ferrous sulfate 300 (60 FE) MG/5ML syrup Take 5 mLs (300 mg total) by mouth daily. 01/31/14   Richarda Overlie, MD  loratadine (CLARITIN) 10 MG tablet Take 10 mg by mouth daily.    [provider]  Multiple Vitamin (MULTIVITAMIN WITH MINERALS) TABS tablet Take 1 tablet by mouth daily with breakfast.    [provider]  pantoprazole (PROTONIX) 40 MG tablet Take 1 tablet (40 mg total) by mouth 2 (two) times daily. 01/31/14   Richarda Overlie, MD  ramipril (ALTACE) 10 MG capsule Take 10 mg by mouth daily with breakfast.    [provider]  simvastatin  (ZOCOR) 40 MG tablet Take 40 mg by mouth at bedtime.    [provider]    Allergies    Patient has no known allergies.  Review of Systems   Review of Systems  Constitutional: Positive for fatigue. Negative for chills and fever.  HENT: Positive for congestion and rhinorrhea.   Respiratory: Positive for cough, shortness of breath and wheezing.   Gastrointestinal: Negative for abdominal pain, nausea and vomiting.  Genitourinary: Negative for dysuria and frequency.  Musculoskeletal: Negative for arthralgias and myalgias.  Skin: Negative for color change.  Neurological: Negative for dizziness, syncope and numbness.  All other systems reviewed and are negative.   Physical Exam Updated Vital Signs BP 138/72   Pulse (!) 117   Temp 98.1 F (36.7 C) (Oral)   Resp (!) 22   SpO2 98%   Physical Exam Vitals and nursing note reviewed.  Constitutional:      General: He is not in acute distress.    Appearance: He is well-developed and well-nourished. He is ill-appearing. He is not diaphoretic.     Comments: Elderly gentleman, chronically ill-appearing, but in no acute distress  HENT:     Head: Normocephalic and atraumatic.     Mouth/Throat:     Mouth: Oropharynx is clear and moist.  Eyes:     General:        Right  eye: No discharge.        Left eye: No discharge.     Extraocular Movements: EOM normal.     Pupils: Pupils are equal, round, and reactive to light.  Cardiovascular:     Rate and Rhythm: Normal rate and regular rhythm.     Pulses: Intact distal pulses.     Heart sounds: Normal heart sounds.  Pulmonary:     Effort: Pulmonary effort is normal. No respiratory distress.     Breath sounds: Decreased breath sounds and wheezing present. No rhonchi.     Comments: On 2 L nasal cannula patient mildly tachypneic, but respirations otherwise equal and unlabored, patient able to speak in full sentences, on auscultation patient has very diminished lung sounds with very little  air movement, faint wheezing noted bilaterally, no rales or rhonchi Chest:     Chest wall: No tenderness.  Abdominal:     General: Bowel sounds are normal. There is no distension.     Palpations: Abdomen is soft. There is no mass.     Tenderness: There is no abdominal tenderness. There is no guarding.     Comments: Abdomen soft, nondistended, nontender to palpation in all quadrants without guarding or peritoneal signs   Musculoskeletal:        General: No deformity or edema.     Cervical back: Neck supple.     Right lower leg: No tenderness. No edema.     Left lower leg: No tenderness. No edema.  Skin:    General: Skin is warm and dry.     Capillary Refill: Capillary refill takes less than 2 seconds.  Neurological:     Mental Status: He is alert.     Coordination: Coordination normal.     Comments: Speech is clear, able to follow commands Moves extremities without ataxia, coordination intact  Psychiatric:        Mood and Affect: Mood normal.        Behavior: Behavior normal.     ED Results / Procedures / Treatments   Labs (all labs ordered are listed, but only abnormal results are displayed) Labs Reviewed  BASIC METABOLIC PANEL - Abnormal; Notable for the following components:      Result Value   Glucose, Bld 124 (*)    BUN 26 (*)    Creatinine, Ser 1.32 (*)    GFR, Estimated 52 (*)    All other components within normal limits  CBC - Abnormal; Notable for the following components:   WBC 13.8 (*)    All other components within normal limits  BRAIN NATRIURETIC PEPTIDE - Abnormal; Notable for the following components:   B Natriuretic Peptide 199.8 (*)    All other components within normal limits  RESP PANEL BY RT-PCR (FLU A&B, COVID) ARPGX2  LACTIC ACID, PLASMA  TROPONIN I (HIGH SENSITIVITY)  TROPONIN I (HIGH SENSITIVITY)    EKG None  Radiology DG Chest 2 View  Result Date: 02/10/2020 CLINICAL DATA:  Productive cough, shortness of breath for 5 days. EXAM: CHEST - 2  VIEW COMPARISON:  November 11, 2007 FINDINGS: The heart size and mediastinal contours are within normal limits. Both lungs are clear. The visualized skeletal structures are unremarkable. IMPRESSION: No active cardiopulmonary disease. Electronically Signed   By: Abelardo Diesel M.D.   On: 02/10/2020 14:11   CT Angio Chest PE W and/or Wo Contrast  Result Date: 02/10/2020 CLINICAL DATA:  87 year old male with history of shortness of breath and chest pain. High probability of  pulmonary embolism. EXAM: CT ANGIOGRAPHY CHEST WITH CONTRAST TECHNIQUE: Multidetector CT imaging of the chest was performed using the standard protocol during bolus administration of intravenous contrast. Multiplanar CT image reconstructions and MIPs were obtained to evaluate the vascular anatomy. CONTRAST:  124mL OMNIPAQUE IOHEXOL 350 MG/ML SOLN COMPARISON:  No priors. FINDINGS: Cardiovascular: No filling defects within the pulmonary arterial tree to suggest underlying pulmonary embolism. Heart size is normal. There is no significant pericardial fluid, thickening or pericardial calcification. There is aortic atherosclerosis, as well as atherosclerosis of the great vessels of the mediastinum and the coronary arteries, including calcified atherosclerotic plaque in the left main, left anterior descending, left circumflex and right coronary arteries. Thickening calcification of the aortic valve. Mediastinum/Nodes: No pathologically enlarged mediastinal or hilar lymph nodes. Esophagus is unremarkable in appearance. No axillary lymphadenopathy. Lungs/Pleura: No acute consolidative airspace disease. No pleural effusions. No definite suspicious appearing pulmonary nodules or masses are noted. Upper Abdomen: Aortic atherosclerosis. Musculoskeletal: Chronic appearing compression fracture of T6 with 50% loss of anterior vertebral body height. Healing nondisplaced fracture of the anterolateral aspect of the right sixth rib incidentally noted. There are no  aggressive appearing lytic or blastic lesions noted in the visualized portions of the skeleton. Review of the MIP images confirms the above findings. IMPRESSION: 1. No evidence of pulmonary embolism. 2. No acute findings in the thorax to account for the patient's symptoms. 3. Healing fracture of the anterolateral aspect of the right sixth rib. 4. Diffuse bronchial wall thickening with mild to moderate centrilobular and paraseptal emphysema; imaging findings suggestive of underlying COPD. 5. Aortic atherosclerosis, in addition to left main and 3 vessel coronary artery disease. 6. There are calcifications of the aortic valve. Echocardiographic correlation for evaluation of potential valvular dysfunction may be warranted if clinically indicated. Aortic Atherosclerosis (ICD10-I70.0) and Emphysema (ICD10-J43.9). Electronically Signed   By: Vinnie Langton M.D.   On: 02/10/2020 15:36    Procedures Procedures (including critical care time)  Medications Ordered in ED Medications  albuterol (VENTOLIN HFA) 108 (90 Base) MCG/ACT inhaler 6 puff (has no administration in time range)  aerochamber plus with mask device 1 each (has no administration in time range)  methylPREDNISolone sodium succinate (SOLU-MEDROL) 125 mg/2 mL injection 125 mg (has no administration in time range)    ED Course  I have reviewed the triage vital signs and the nursing notes.  Pertinent labs & imaging results that were available during my care of the patient were reviewed by me and considered in my medical decision making (see chart for details).    MDM Rules/Calculators/A&P                          87 year old male presents with a month of shortness of breath with exertion, worsening over the past 3 days with associated fatigue, malaise and cough.  Patient states he feels like he needs to cough up mucus but cannot get anything up.  Went to urgent care but was sent to the ED for further evaluation.  On arrival patient was found to  be tachypneic and hypoxic to 87%, placed on 2 L nasal cannula with improvement.  Patient has known history of emphysema but denies having COPD exacerbations previously.  Denies associated chest pain.  No lower extremity swelling or signs of fluid overload noted on exam.  No fevers or chills.  Patient has had both vaccines and booster.  Concern for potential COPD exacerbation, versus cardiac etiology, PE, pneumonia, Covid or  flu.  Will get basic labs, troponin, BNP, chest x-ray, EKG and Covid swab.  Albuterol and steroids given.  I have independently ordered, reviewed and interpreted all labs and imaging:  CBC: Mild leukocytosis of 13.8, normal hemoglobin BMP: Glucose of 124, no other significant electrolyte derangements, creatinine is mildly elevated at 1.32 with BUN of 26, suspect in the setting of dehydration, his wife reports decreased p.o. intake. Troponin: Negative BNP: Minimally elevated at 199.8 Lactic acid: Not elevated at 1.3  Covid/flu: Negative  Chest x-ray: No active cardiopulmonary disease  EKG: Sinus rhythm with some nonspecific T wave abnormalities  CTA with no evidence of PE, findings consistent with COPD.  Patient with some increased wheezing after breathing treatments, remained stable on 2 L nasal cannula.  Work-up does not suggest cardiac etiology, suspect COPD exacerbation, patient is still quite diminished on exam feel he will require continued treatment in hospital, especially in the setting of new oxygen requirement.  Case discussed with Dr. Marva Panda with [facility] who will see and admit the patient.   Final Clinical Impression(s) / ED Diagnoses Final diagnoses:  COPD exacerbation [facility])    Rx / DC Orders ED Discharge Orders    None       Janet Berlin 02/10/20 1717    Hayden Rasmussen, MD 02/11/20 1029

## 2020-02-10 NOTE — ED Triage Notes (Addendum)
Patient c/o SOB with productive cough x5 weeks. Patient reports SOB since fall and worsens with movement.

## 2020-02-11 ENCOUNTER — Inpatient Hospital Stay (HOSPITAL_COMMUNITY): Payer: Medicare Other

## 2020-02-11 ENCOUNTER — Other Ambulatory Visit: Payer: Self-pay

## 2020-02-11 ENCOUNTER — Encounter (HOSPITAL_COMMUNITY): Payer: Self-pay | Admitting: Internal Medicine

## 2020-02-11 DIAGNOSIS — R0602 Shortness of breath: Secondary | ICD-10-CM | POA: Diagnosis not present

## 2020-02-11 DIAGNOSIS — J441 Chronic obstructive pulmonary disease with (acute) exacerbation: Secondary | ICD-10-CM | POA: Diagnosis not present

## 2020-02-11 LAB — LIPID PANEL
Cholesterol: 158 mg/dL (ref 0–200)
HDL: 65 mg/dL (ref >40)
LDL Cholesterol: 80 mg/dL (ref 0–99)
Total CHOL/HDL Ratio: 2.4 RATIO
Triglycerides: 64 mg/dL (ref <150)
VLDL: 13 mg/dL (ref 0–40)

## 2020-02-11 LAB — BASIC METABOLIC PANEL
Anion gap: 11 (ref 5–15)
BUN: 28 mg/dL — ABNORMAL HIGH (ref 8–23)
CO2: 23 mmol/L (ref 22–32)
Calcium: 9.2 mg/dL (ref 8.9–10.3)
Chloride: 106 mmol/L (ref 98–111)
Creatinine, Ser: 1.18 mg/dL (ref 0.61–1.24)
GFR, Estimated: 59 mL/min — ABNORMAL LOW (ref >60)
Glucose, Bld: 139 mg/dL — ABNORMAL HIGH (ref 70–99)
Potassium: 4.3 mmol/L (ref 3.5–5.1)
Sodium: 140 mmol/L (ref 135–145)

## 2020-02-11 LAB — CBC
HCT: 38.1 % — ABNORMAL LOW (ref 39.0–52.0)
Hemoglobin: 12.1 g/dL — ABNORMAL LOW (ref 13.0–17.0)
MCH: 29.2 pg (ref 26.0–34.0)
MCHC: 31.8 g/dL (ref 30.0–36.0)
MCV: 91.8 fL (ref 80.0–100.0)
Platelets: 258 10*3/uL (ref 150–400)
RBC: 4.15 MIL/uL — ABNORMAL LOW (ref 4.22–5.81)
RDW: 13.6 % (ref 11.5–15.5)
WBC: 13.1 10*3/uL — ABNORMAL HIGH (ref 4.0–10.5)
nRBC: 0 % (ref 0.0–0.2)

## 2020-02-11 LAB — ECHOCARDIOGRAM COMPLETE
AR max vel: 3.07 cm2
AV Area VTI: 3.43 cm2
AV Area mean vel: 2.9 cm2
AV Mean grad: 6 mmHg
AV Peak grad: 10.9 mmHg
Ao pk vel: 1.65 m/s
Area-P 1/2: 3.75 cm2
Calc EF: 56.4 %
Height: 64.5 in
S' Lateral: 3.45 cm
Single Plane A2C EF: 53.7 %
Single Plane A4C EF: 56.1 %
Weight: 2250.46 oz

## 2020-02-11 MED ORDER — IPRATROPIUM-ALBUTEROL 0.5-2.5 (3) MG/3ML IN SOLN
3.0000 mL | Freq: Three times a day (TID) | RESPIRATORY_TRACT | Status: DC
  Administered 2020-02-11 – 2020-02-12 (×3): 3 mL via RESPIRATORY_TRACT
  Filled 2020-02-11 (×3): qty 3

## 2020-02-11 NOTE — Plan of Care (Signed)

## 2020-02-11 NOTE — Progress Notes (Signed)
SATURATION QUALIFICATIONS: (This note is used to comply with regulatory documentation for home oxygen)  Patient Saturations on Room Air at Rest = 91%  Patient Saturations on Room Air while Ambulating = 83%  Patient Saturations on 3 Liters of oxygen while Ambulating = 94%  Please briefly explain why patient needs home oxygen:

## 2020-02-11 NOTE — Progress Notes (Signed)
  Echocardiogram 2D Echocardiogram has been performed.  Gerald Valdez 02/11/2020, 1:34 PM

## 2020-02-11 NOTE — Progress Notes (Signed)
PROGRESS NOTE  [name]  DOB: [birth year]  PCP: [name], MD YQI:[id number]  DOA: 02/10/2020  LOS: 1 day   Chief Complaint  Patient presents with  . Shortness of Breath   Brief narrative: Gerald Valdez is a 87 y.o. male with PMH significant for bladder cancer, COPD and emphysema, Hypertension, hyperlipidemia, GERD. Patient presented to the ED from urgent care  on 1/2 with complaints of productive cough and shortness of breath x 1 week.  Patient states that for the past 5 weeks or so he has had significant dyspnea on exertion without chest pain or palpitations which is limiting his ADLs. He works regularly with his hands and on cars and as of recently he has been unable to do this, which is very unlike him.  Over the past week he has had significant decline in his respiratory status and is much more short of breath, difficulty coughing up sputum with some wheezing but without fever.  He went to the urgent care on 1/2 for evaluation and was found to be hypoxic and was sent to the ED.    In the ED, patient was afebrile, tachycardic, tachypneic, hypertensive, hypoxic (SpO2 87% on room air) placed on 2 L/min.  Sodium 140, K4.4, glucose 124, BUN 26, creatinine 1.32, BNP 199, WBC 13.8, Hb 13.2, platelets 282, lactic acid 1.3. COVID-19 and flu negative.  CXR unremarkable.   CTA chest did not show any evidence of pulmonary embolism but showed healing right sixth rib fracture, diffuse bronchial wall thickening with mild to moderate centrilobular and paraseptal emphysema suggestive of COPD, aortic atherosclerosis and three-vessel CAD and calcifications of the aortic valve.   Subjective: Patient was seen and examined this morning.  Pleasant elderly Caucasian male.  Lying on bed.  Not in distress.  Feels better than at presentation.  Wife at bedside. Chart reviewed Tachycardic in 100s, on 3 L oxygen by nasal cannula.  Assessment/Plan: COPD exacerbation -Presented with increased cough  and sputum production, requiring O2 -Emphysema on CT -Currently on doxycycline 100 mg twice daily and IV Solu-Medrol 40 mg  -Continue the same.  Continue bronchodilators.  Dyspnea on exertion -CT scan with aortic atherosclerosis, three-vessel CAD including left main artery as well as calcifications of the aortic valve-echo recommended by radiology -Pending echo. -Needs cardiology ischemia evaluation as an outpatient  Hypertension Continue home amlodipine On hold is ramipril  Hyperlipidemia Continue simvastatin  GERD Continue Protonix  Aortic atherosclerosis Lipid panel  Mobility: Independent prior to admission.  Increase ambulation Code Status:   Code Status: DNR  Nutritional status: Body mass index is 23.77 kg/m.     Diet Order            Diet Heart Room service appropriate? Yes; Fluid consistency: Thin  Diet effective now                 DVT prophylaxis: enoxaparin (LOVENOX) injection 40 mg Start: 02/10/20 2200   Antimicrobials:  Doxycycline Fluid: None Consultants: None Family Communication:  Wife at bedside  Status is: Inpatient  Remains inpatient appropriate because: Continues to wheeze.  Nasal steroids and antibiotics  Dispo: The patient is from: Home              Anticipated d/c is to: Home              Anticipated d/c date is: 1 day              Patient currently is not medically stable to d/c.  Infusions:    Scheduled Meds: . amLODipine  5 mg Oral Q breakfast  . doxycycline  100 mg Oral Q12H  . enoxaparin (LOVENOX) injection  40 mg Subcutaneous Q24H  . ipratropium-albuterol  3 mL Nebulization TID  . methylPREDNISolone (SOLU-MEDROL) injection  40 mg Intravenous Q6H   Followed by  . [START ON 02/12/2020] predniSONE  40 mg Oral Q breakfast  . pantoprazole  40 mg Oral BID  . simvastatin  40 mg Oral QHS    Antimicrobials: Anti-infectives (From admission, onward)   Start     Dose/Rate Route Frequency Ordered Stop   02/10/20 2200   doxycycline (VIBRA-TABS) tablet 100 mg        100 mg Oral Every 12 hours 02/10/20 1817 02/15/20 2159      PRN meds: acetaminophen **OR** acetaminophen, albuterol, metoprolol tartrate   Objective: Vitals:   02/11/20 0702 02/11/20 0800  BP: (!) 143/73   Pulse: 88   Resp:    Temp:    SpO2: 100% 96%    Intake/Output Summary (Last 24 hours) at 02/11/2020 0942 Last data filed at 02/11/2020 0500 Gross per 24 hour  Intake 100 ml  Output 350 ml  Net -250 ml   Filed Weights   02/10/20 2102 02/10/20 2300  Weight: 66.2 kg 63.8 kg   Weight change:  Body mass index is 23.77 kg/m.   Physical Exam: General exam: Pleasant, elderly Caucasian male.  Not in distress Skin: No rashes, lesions or ulcers. HEENT: Atraumatic, normocephalic, no obvious bleeding Lungs: Mild scattered bilateral wheezing.  No crackles CVS: Regular rate and rhythm, no murmur GI/Abd soft, nontender, nondistended, bowel sound present CNS: Alert, awake, oriented x3 Psychiatry: Mood appropriate Extremities: No pedal edema, no calf tenderness  Data Review: I have personally reviewed the laboratory data and studies available.  Recent Labs  Lab 02/10/20 1412 02/11/20 0443  WBC 13.8* 13.1*  HGB 13.2 12.1*  HCT 42.0 38.1*  MCV 92.3 91.8  PLT 282 258   Recent Labs  Lab 02/10/20 1412 02/11/20 0443  NA 140 140  K 4.4 4.3  CL 103 106  CO2 25 23  GLUCOSE 124* 139*  BUN 26* 28*  CREATININE 1.32* 1.18  CALCIUM 9.3 9.2    F/u labs ordered  Signed, Terrilee Croak, MD [facility] 02/11/2020

## 2020-02-12 DIAGNOSIS — J441 Chronic obstructive pulmonary disease with (acute) exacerbation: Secondary | ICD-10-CM | POA: Diagnosis not present

## 2020-02-12 LAB — CBC
HCT: 38.6 % — ABNORMAL LOW (ref 39.0–52.0)
Hemoglobin: 12.2 g/dL — ABNORMAL LOW (ref 13.0–17.0)
MCH: 29.3 pg (ref 26.0–34.0)
MCHC: 31.6 g/dL (ref 30.0–36.0)
MCV: 92.8 fL (ref 80.0–100.0)
Platelets: 275 10*3/uL (ref 150–400)
RBC: 4.16 MIL/uL — ABNORMAL LOW (ref 4.22–5.81)
RDW: 13.8 % (ref 11.5–15.5)
WBC: 22.5 10*3/uL — ABNORMAL HIGH (ref 4.0–10.5)
nRBC: 0 % (ref 0.0–0.2)

## 2020-02-12 LAB — BASIC METABOLIC PANEL
Anion gap: 14 (ref 5–15)
BUN: 40 mg/dL — ABNORMAL HIGH (ref 8–23)
CO2: 21 mmol/L — ABNORMAL LOW (ref 22–32)
Calcium: 9.4 mg/dL (ref 8.9–10.3)
Chloride: 104 mmol/L (ref 98–111)
Creatinine, Ser: 1.26 mg/dL — ABNORMAL HIGH (ref 0.61–1.24)
GFR, Estimated: 55 mL/min — ABNORMAL LOW (ref >60)
Glucose, Bld: 126 mg/dL — ABNORMAL HIGH (ref 70–99)
Potassium: 4.2 mmol/L (ref 3.5–5.1)
Sodium: 139 mmol/L (ref 135–145)

## 2020-02-12 MED ORDER — PREDNISONE 10 MG PO TABS: ORAL_TABLET | ORAL | 0 refills | Status: DC

## 2020-02-12 MED ORDER — DOXYCYCLINE HYCLATE 100 MG PO TABS: 100.0000 mg | ORAL_TABLET | Freq: Two times a day (BID) | ORAL | 0 refills | Status: AC

## 2020-02-12 MED ORDER — IPRATROPIUM-ALBUTEROL 0.5-2.5 (3) MG/3ML IN SOLN: 3.0000 mL | Freq: Two times a day (BID) | RESPIRATORY_TRACT | Status: DC

## 2020-02-12 NOTE — TOC Progression Note (Signed)
Transition of Care [facility]) - Progression Note    Patient Details  Name: Gerald Valdez MRN: [id number] Date of Birth: [birth year]  Transition of Care [facility] CM/SW Contact  Geni Bers, RN Phone Number: 02/12/2020, 12:42 PM  Clinical Narrative:     Spoke with pt's wife concerning Home Oxygen, [facility] was selected, referrral given to in house rep. Gerald Valdez states there are no other needs at this present time. Encouraged her to call PCP if she will have any home health needs in the future. Very pleasant lady.   Expected Discharge Plan: Home/Self Care Barriers to Discharge: No Barriers Identified  Expected Discharge Plan and Services Expected Discharge Plan: Home/Self Care     Post Acute Care Choice: Durable Medical Equipment Living arrangements for the past 2 months: Single Family Home Expected Discharge Date: 02/12/20                 DME Agency: Patsy Lager Date DME Agency Contacted: 02/12/20 Time DME Agency Contacted: [phone] Representative spoke with at DME Agency: Morrie Sheldon             Social Determinants of Health (SDOH) Interventions    Readmission Risk Interventions No flowsheet data found.

## 2020-02-12 NOTE — Plan of Care (Signed)
  Problem: Education: Goal: Knowledge of General Education information will improve Description: Including pain rating scale, medication(s)/side effects and non-pharmacologic comfort measures Outcome: Progressing   Problem: Nutrition: Goal: Adequate nutrition will be maintained Outcome: Progressing   Problem: Elimination: Goal: Will not experience complications related to bowel motility Outcome: Progressing Goal: Will not experience complications related to urinary retention Outcome: Progressing

## 2020-02-12 NOTE — Progress Notes (Signed)
Physician Discharge Summary  Gerald Valdez [id number] DOB: 14-Feb-1928 DOA: 02/10/2020  PCP: Prince Solian, MD  Admit date: 02/10/2020 Discharge date: 02/12/2020  Admitted From: Home Discharge disposition: Home with home oxygen   Code Status: DNR  Diet Recommendation: Cardiac diet  Discharge Diagnosis:   Principal Problem:   COPD exacerbation ([place]) Active Problems:   Aortic atherosclerosis (HCC)   Dyspnea on exertion   Essential hypertension   Hyperlipidemia   GERD (gastroesophageal reflux disease)   Chief Complaint  Patient presents with  . Shortness of Breath   Brief narrative: Gerald Valdez is a 87 y.o. male with PMH significant for bladder cancer, COPD and emphysema, Hypertension, hyperlipidemia, GERD. Patient presented to the ED from urgent care  on 1/2 with complaints of productive cough and shortness of breath x 1 week.  Patient states that for the past 5 weeks or so he has had significant dyspnea on exertion without chest pain or palpitations which is limiting his ADLs. He works regularly with his hands and on cars and as of recently he has been unable to do this, which is very unlike him.  Over the past week he has had significant decline in his respiratory status and is much more short of breath, difficulty coughing up sputum with some wheezing but without fever.  He went to the urgent care on 1/2 for evaluation and was found to be hypoxic and was sent to the ED.    In the ED, patient was afebrile, tachycardic, tachypneic, hypertensive, hypoxic (SpO2 87% on room air) placed on 2 L/min.  Sodium 140, K4.4, glucose 124, BUN 26, creatinine 1.32, BNP 199, WBC 13.8, Hb 13.2, platelets 282, lactic acid 1.3. COVID-19 and flu negative.  CXR unremarkable.   CTA chest did not show any evidence of pulmonary embolism but showed healing right sixth rib fracture, diffuse bronchial wall thickening with mild to moderate centrilobular and paraseptal emphysema suggestive of COPD,  aortic atherosclerosis and three-vessel CAD and calcifications of the aortic valve.   Subjective: Patient was seen and examined this morning.  Pleasant elderly Caucasian male.  Lying on bed.  Not in distress.  Feels better than at presentation.  Wife at bedside. Chart reviewed Remains on oxygen 3 L but feels better.  Hospital course: COPD exacerbation Acute respite failure with hypoxia -Presented with increased cough and sputum production, requiring O2 -Emphysema on CT -Currently on doxycycline 100 mg twice daily and IV Solu-Medrol 40 mg  -Discharge with 3 more days of doxycycline and a tapering course of prednisone. -Ambulatory oxygen requirement 3 L/min.  Discharge on home oxygen.  Dyspnea on exertion -CT scan with aortic atherosclerosis, three-vessel CAD including left main artery as well as calcifications of the aortic valve-echo recommended by radiology -Echocardiogram with EF 55 to 60% and grade 1 diastolic dysfunction. -Needs cardiology ischemia evaluation as an outpatient -Ambulatory referral given.  Hypertension Continue home amlodipine and ramipril  Hyperlipidemia Continue simvastatin  GERD Continue Protonix  Aortic atherosclerosis Lipid Panel is satisfactory    Component Value Date/Time   CHOL 158 02/10/2020 1717   TRIG 64 02/10/2020 1717   HDL 65 02/10/2020 1717   CHOLHDL 2.4 02/10/2020 1717   VLDL 13 02/10/2020 1717   LDLCALC 80 02/10/2020 1717   Stable discharge home today.  Wound care:    Discharge Exam:   Vitals:   02/12/20 0421 02/12/20 0810 02/12/20 1032 02/12/20 1039  BP: 134/78  137/83   Pulse: 82  (!) 111   Resp: Marland Kitchen)  21  20 (!) 22  Temp: 97.7 F (36.5 C)     TempSrc: Oral     SpO2: 99% 94% 98%   Weight:      Height:        Body mass index is 23.77 kg/m.  General exam: Pleasant, elderly Caucasian male.  Not in distress Skin: No rashes, lesions or ulcers. HEENT: Atraumatic, normocephalic, no obvious bleeding Lungs: Clear to  auscultate bilaterally CVS: Regular rate and rhythm, no murmur GI/Abd soft, nontender, nondistended, bowel sound present CNS: Alert, awake, oriented x3 Psychiatry: Mood appropriate Extremities: No pedal edema, no calf tenderness  Follow ups:   Discharge Instructions    Ambulatory referral to Cardiology   Complete by: As directed    Coronary calcifications noted in all coronary vessels   Diet - low sodium heart healthy   Complete by: As directed    Increase activity slowly   Complete by: As directed       Follow-up Information    Avva, Ravisankar, MD Follow up.   Specialty: Internal Medicine Contact information: [address] [place] [address] [phone]               Recommendations for Outpatient Follow-Up:   1. Follow-up with PCP as an outpatient 2. Ambulatory referral to cardiology given  Discharge Instructions:  Follow with Primary MD Avva, Ravisankar, MD in 7 days   Get CBC/BMP checked in next visit within 1 week by PCP or SNF MD ( we routinely change or add medications that can affect your baseline labs and fluid status, therefore we recommend that you get the mentioned basic workup next visit with your PCP, your PCP may decide not to get them or add new tests based on their clinical decision)  On your next visit with your PCP, please Get Medicines reviewed and adjusted.  Please request your PCP  to go over all Hospital Tests and Procedure/Radiological results at the follow up, please get all Hospital records sent to your Prim MD by signing hospital release before you go home.  Activity: As tolerated with Full fall precautions use walker/cane & assistance as needed  For Heart failure patients - Check your Weight same time everyday, if you gain over 2 pounds, or you develop in leg swelling, experience more shortness of breath or chest pain, call your Primary MD immediately. Follow Cardiac Low Salt Diet and 1.5 lit/day fluid restriction.  If you have  smoked or chewed Tobacco in the last 2 yrs please stop smoking, stop any regular Alcohol  and or any Recreational drug use.  If you experience worsening of your admission symptoms, develop shortness of breath, life threatening emergency, suicidal or homicidal thoughts you must seek medical attention immediately by calling 911 or calling your MD immediately  if symptoms less severe.  You Must read complete instructions/literature along with all the possible adverse reactions/side effects for all the Medicines you take and that have been prescribed to you. Take any new Medicines after you have completely understood and accpet all the possible adverse reactions/side effects.   Do not drive, operate heavy machinery, perform activities at heights, swimming or participation in water activities or provide baby sitting services if your were admitted for syncope or siezures until you have seen by Primary MD or a Neurologist and advised to do so again.  Do not drive when taking Pain medications.  Do not take more than prescribed Pain, Sleep and Anxiety Medications  Wear Seat belts while driving.  Please note You were cared for by a hospitalist during your hospital stay. If you have any questions about your discharge medications or the care you received while you were in the hospital after you are discharged, you can call the unit and asked to speak with the hospitalist on call if the hospitalist that took care of you is not available. Once you are discharged, your primary care physician will handle any further medical issues. Please note that NO REFILLS for any discharge medications will be authorized once you are discharged, as it is imperative that you return to your primary care physician (or establish a relationship with a primary care physician if you do not have one) for your aftercare needs so that they can reassess your need for medications and monitor your lab values.    Allergies as of 02/12/2020    No Known Allergies     Medication List    TAKE these medications   albuterol 108 (90 Base) MCG/ACT inhaler Commonly known as: VENTOLIN HFA Inhale 2 puffs into the lungs every 6 (six) hours as needed for wheezing or shortness of breath.   amLODipine 5 MG tablet Commonly known as: NORVASC Take 5 mg by mouth daily with breakfast.   Anoro Ellipta 62.5-25 MCG/INH Aepb Generic drug: umeclidinium-vilanterol Inhale 1 puff into the lungs daily.   doxycycline 100 MG tablet Commonly known as: VIBRA-TABS Take 1 tablet (100 mg total) by mouth every 12 (twelve) hours for 3 days.   escitalopram 10 MG tablet Commonly known as: LEXAPRO Take 10 mg by mouth daily.   loratadine 10 MG tablet Commonly known as: CLARITIN Take 10 mg by mouth daily.   multivitamin with minerals Tabs tablet Take 1 tablet by mouth daily with breakfast.   pantoprazole 40 MG tablet Commonly known as: Protonix Take 1 tablet (40 mg total) by mouth 2 (two) times daily.   predniSONE 10 MG tablet Commonly known as: DELTASONE Take 4 tablets daily X 2 days, then, Take 3 tablets daily X 2 days, then, Take 2 tablets daily X 2 days, then, Take 1 tablets daily X 1 day.   ramipril 10 MG capsule Commonly known as: ALTACE Take 10 mg by mouth daily with breakfast.   simvastatin 40 MG tablet Commonly known as: ZOCOR Take 40 mg by mouth at bedtime.            Durable Medical Equipment  (From admission, onward)         Start     Ordered   02/12/20 0753  For home use only DME oxygen  Once       Question Answer Comment  Length of Need Lifetime   Mode or (Route) Nasal cannula   Liters per Minute 3   Frequency Continuous (stationary and portable oxygen unit needed)   Oxygen conserving device Yes   Oxygen delivery system Gas      02/12/20 0752          Time coordinating discharge: 35 minutes  The results of significant diagnostics from this hospitalization (including imaging, microbiology, ancillary and  laboratory) are listed below for reference.    Procedures and Diagnostic Studies:   DG Chest 2 View  Result Date: 02/10/2020 CLINICAL DATA:  Productive cough, shortness of breath for 5 days. EXAM: CHEST - 2 VIEW COMPARISON:  November 11, 2007 FINDINGS: The heart size and mediastinal contours are within normal limits. Both lungs are clear. The visualized skeletal structures are unremarkable. IMPRESSION: No active cardiopulmonary disease. Electronically Signed  By: Abelardo Diesel M.D.   On: 02/10/2020 14:11   CT Angio Chest PE W and/or Wo Contrast  Result Date: 02/10/2020 CLINICAL DATA:  87 year old male with history of shortness of breath and chest pain. High probability of pulmonary embolism. EXAM: CT ANGIOGRAPHY CHEST WITH CONTRAST TECHNIQUE: Multidetector CT imaging of the chest was performed using the standard protocol during bolus administration of intravenous contrast. Multiplanar CT image reconstructions and MIPs were obtained to evaluate the vascular anatomy. CONTRAST:  182mL OMNIPAQUE IOHEXOL 350 MG/ML SOLN COMPARISON:  No priors. FINDINGS: Cardiovascular: No filling defects within the pulmonary arterial tree to suggest underlying pulmonary embolism. Heart size is normal. There is no significant pericardial fluid, thickening or pericardial calcification. There is aortic atherosclerosis, as well as atherosclerosis of the great vessels of the mediastinum and the coronary arteries, including calcified atherosclerotic plaque in the left main, left anterior descending, left circumflex and right coronary arteries. Thickening calcification of the aortic valve. Mediastinum/Nodes: No pathologically enlarged mediastinal or hilar lymph nodes. Esophagus is unremarkable in appearance. No axillary lymphadenopathy. Lungs/Pleura: No acute consolidative airspace disease. No pleural effusions. No definite suspicious appearing pulmonary nodules or masses are noted. Upper Abdomen: Aortic atherosclerosis. Musculoskeletal:  Chronic appearing compression fracture of T6 with 50% loss of anterior vertebral body height. Healing nondisplaced fracture of the anterolateral aspect of the right sixth rib incidentally noted. There are no aggressive appearing lytic or blastic lesions noted in the visualized portions of the skeleton. Review of the MIP images confirms the above findings. IMPRESSION: 1. No evidence of pulmonary embolism. 2. No acute findings in the thorax to account for the patient's symptoms. 3. Healing fracture of the anterolateral aspect of the right sixth rib. 4. Diffuse bronchial wall thickening with mild to moderate centrilobular and paraseptal emphysema; imaging findings suggestive of underlying COPD. 5. Aortic atherosclerosis, in addition to left main and 3 vessel coronary artery disease. 6. There are calcifications of the aortic valve. Echocardiographic correlation for evaluation of potential valvular dysfunction may be warranted if clinically indicated. Aortic Atherosclerosis (ICD10-I70.0) and Emphysema (ICD10-J43.9). Electronically Signed   By: Vinnie Langton M.D.   On: 02/10/2020 15:36   ECHOCARDIOGRAM COMPLETE  Result Date: 02/11/2020    ECHOCARDIOGRAM REPORT   Patient Name:   RUSS GASTER Date of Exam: 02/11/2020 Medical Rec #:  [id number]       Height:       64.5 in Accession #:    [id number]      Weight:       140.7 lb Date of Birth:  04/15/1928       BSA:          1.694 m Patient Age:    54 years        BP:           134/72 mmHg Patient Gender: M               HR:           113 bpm. Exam Location:  Inpatient Procedure: 2D Echo, Cardiac Doppler and Color Doppler Indications:    R06.02 SOB  History:        Patient has no prior history of Echocardiogram examinations.                 COPD, Signs/Symptoms:Dyspnea and Shortness of Breath; Risk                 Factors:Hypertension. Cancer.  Sonographer:    Garrett Referring Phys: [phone]  JARED E SEGAL  Sonographer Comments: Technically difficult study due to  poor echo windows, suboptimal parasternal window and suboptimal apical window. Image acquisition challenging due to respiratory motion. IMPRESSIONS  1. Left ventricular ejection fraction, by estimation, is 55 to 60%. The left ventricle has normal function. The left ventricle has no regional wall motion abnormalities. Left ventricular diastolic parameters are consistent with Grade I diastolic dysfunction (impaired relaxation).  2. Right ventricular systolic function is normal. The right ventricular size is normal.  3. The mitral valve is normal in structure. No evidence of mitral valve regurgitation. No evidence of mitral stenosis.  4. There is a 1 cm x 0.8 cm calcified, mobile density in the LVOT between the aortic valve and anterior mitral valve leaflet. Consider TEE to better evaluate. The aortic valve is calcified. There is moderate calcification of the aortic valve. There is moderate thickening of the aortic valve. Aortic valve regurgitation is not visualized. No aortic stenosis is present.  5. The inferior vena cava is normal in size with greater than 50% respiratory variability, suggesting right atrial pressure of 3 mmHg. FINDINGS  Left Ventricle: Left ventricular ejection fraction, by estimation, is 55 to 60%. The left ventricle has normal function. The left ventricle has no regional wall motion abnormalities. The left ventricular internal cavity size was normal in size. There is  no left ventricular hypertrophy. Left ventricular diastolic parameters are consistent with Grade I diastolic dysfunction (impaired relaxation). Indeterminate filling pressures. Right Ventricle: The right ventricular size is normal. No increase in right ventricular wall thickness. Right ventricular systolic function is normal. Left Atrium: Left atrial size was normal in size. Right Atrium: Right atrial size was normal in size. Pericardium: There is no evidence of pericardial effusion. Mitral Valve: The mitral valve is normal in  structure. No evidence of mitral valve regurgitation. No evidence of mitral valve stenosis. MV peak gradient, 7.3 mmHg. The mean mitral valve gradient is 2.0 mmHg. Tricuspid Valve: The tricuspid valve is normal in structure. Tricuspid valve regurgitation is not demonstrated. No evidence of tricuspid stenosis. Aortic Valve: There is a 1 cm x 0.8 cm calcified, mobile density in the LVOT between the aortic valve and anterior mitral valve leaflet. Consider TEE to better evaluate. The aortic valve is calcified. There is moderate calcification of the aortic valve. There is moderate thickening of the aortic valve. Aortic valve regurgitation is not visualized. No aortic stenosis is present. Aortic valve mean gradient measures 6.0 mmHg. Aortic valve peak gradient measures 10.9 mmHg. Aortic valve area, by VTI measures  3.43 cm. Pulmonic Valve: The pulmonic valve was normal in structure. Pulmonic valve regurgitation is not visualized. No evidence of pulmonic stenosis. Aorta: The aortic root is normal in size and structure. Venous: The inferior vena cava is normal in size with greater than 50% respiratory variability, suggesting right atrial pressure of 3 mmHg. IAS/Shunts: No atrial level shunt detected by color flow Doppler.  LEFT VENTRICLE PLAX 2D LVIDd:         4.70 cm     Diastology LVIDs:         3.45 cm     LV e' medial:    7.62 cm/s LV PW:         1.00 cm     LV E/e' medial:  15.3 LV IVS:        1.00 cm     LV e' lateral:   6.31 cm/s LVOT diam:     2.40 cm     LV  E/e' lateral: 18.5 LV SV:         90 LV SV Index:   53 LVOT Area:     4.52 cm  LV Volumes (MOD) LV vol d, MOD A2C: 60.2 ml LV vol d, MOD A4C: 51.7 ml LV vol s, MOD A2C: 27.9 ml LV vol s, MOD A4C: 22.7 ml LV SV MOD A2C:     32.3 ml LV SV MOD A4C:     51.7 ml LV SV MOD BP:      32.3 ml RIGHT VENTRICLE             IVC RV S prime:     14.00 cm/s  IVC diam: 1.90 cm TAPSE (M-mode): 2.2 cm LEFT ATRIUM             Index       RIGHT ATRIUM           Index LA diam:         3.50 cm 2.07 cm/m  RA Area:     12.20 cm LA Vol (A2C):   28.0 ml 16.53 ml/m RA Volume:   25.50 ml  15.06 ml/m LA Vol (A4C):   23.1 ml 13.64 ml/m LA Biplane Vol: 27.4 ml 16.18 ml/m  AORTIC VALVE AV Area (Vmax):    3.07 cm AV Area (Vmean):   2.90 cm AV Area (VTI):     3.43 cm AV Vmax:           165.00 cm/s AV Vmean:          116.000 cm/s AV VTI:            0.264 m AV Peak Grad:      10.9 mmHg AV Mean Grad:      6.0 mmHg LVOT Vmax:         112.00 cm/s LVOT Vmean:        74.400 cm/s LVOT VTI:          0.200 m LVOT/AV VTI ratio: 0.76  AORTA Ao Root diam: 3.30 cm MITRAL VALVE MV Area (PHT): 3.75 cm     SHUNTS MV Peak grad:  7.3 mmHg     Systemic VTI:  0.20 m MV Mean grad:  2.0 mmHg     Systemic Diam: 2.40 cm MV Vmax:       1.35 m/s MV Vmean:      62.0 cm/s MV Decel Time: 203 msec MV E velocity: 116.90 cm/s MV A velocity: 116.50 cm/s MV E/A ratio:  1.00 Skeet Latch MD Electronically signed by Skeet Latch MD Signature Date/Time: 02/11/2020/5:11:33 PM    Final      Labs:   Basic Metabolic Panel: Recent Labs  Lab 02/10/20 1412 02/11/20 0443 02/12/20 0400  NA 140 140 139  K 4.4 4.3 4.2  CL 103 106 104  CO2 25 23 21*  GLUCOSE 124* 139* 126*  BUN 26* 28* 40*  CREATININE 1.32* 1.18 1.26*  CALCIUM 9.3 9.2 9.4   GFR Estimated Creatinine Clearance: 34 mL/min (A) (by C-G formula based on SCr of 1.26 mg/dL (H)). Liver Function Tests: No results for input(s): AST, ALT, ALKPHOS, BILITOT, PROT, ALBUMIN in the last 168 hours. No results for input(s): LIPASE, AMYLASE in the last 168 hours. No results for input(s): AMMONIA in the last 168 hours. Coagulation profile No results for input(s): INR, PROTIME in the last 168 hours.  CBC: Recent Labs  Lab 02/10/20 1412 02/11/20 0443 02/12/20 0400  WBC 13.8* 13.1* 22.5*  HGB 13.2  12.1* 12.2*  HCT 42.0 38.1* 38.6*  MCV 92.3 91.8 92.8  PLT 282 258 275   Cardiac Enzymes: No results for input(s): CKTOTAL, CKMB, CKMBINDEX, TROPONINI in the  last 168 hours. BNP: Invalid input(s): POCBNP CBG: No results for input(s): GLUCAP in the last 168 hours. D-Dimer No results for input(s): DDIMER in the last 72 hours. Hgb A1c No results for input(s): HGBA1C in the last 72 hours. Lipid Profile Recent Labs    02/10/20 1717  CHOL 158  HDL 65  LDLCALC 80  TRIG 64  CHOLHDL 2.4   Thyroid function studies No results for input(s): TSH, T4TOTAL, T3FREE, THYROIDAB in the last 72 hours.  Invalid input(s): FREET3 Anemia work up No results for input(s): VITAMINB12, FOLATE, FERRITIN, TIBC, IRON, RETICCTPCT in the last 72 hours. Microbiology Recent Results (from the past 240 hour(s))  Resp Panel by RT-PCR (Flu A&B, Covid) Nasopharyngeal Swab     Status: None   Collection Time: 02/10/20 12:48 PM   Specimen: Nasopharyngeal Swab; Nasopharyngeal(NP) swabs in vial transport medium  Result Value Ref Range Status   SARS Coronavirus 2 by RT PCR NEGATIVE NEGATIVE Final    Comment: (NOTE) SARS-CoV-2 target nucleic acids are NOT DETECTED.  The SARS-CoV-2 RNA is generally detectable in upper respiratory specimens during the acute phase of infection. The lowest concentration of SARS-CoV-2 viral copies this assay can detect is 138 copies/mL. A negative result does not preclude SARS-Cov-2 infection and should not be used as the sole basis for treatment or other patient management decisions. A negative result may occur with  improper specimen collection/handling, submission of specimen other than nasopharyngeal swab, presence of viral mutation(s) within the areas targeted by this assay, and inadequate number of viral copies(<138 copies/mL). A negative result must be combined with clinical observations, patient history, and epidemiological information. The expected result is Negative.  Fact Sheet for Patients:  EntrepreneurPulse.com.au  Fact Sheet for Healthcare Providers:  IncredibleEmployment.be  This test  is no t yet approved or cleared by the [place] FDA and  has been authorized for detection and/or diagnosis of SARS-CoV-2 by FDA under an Emergency Use Authorization (EUA). This EUA will remain  in effect (meaning this test can be used) for the duration of the COVID-19 declaration under Section 564(b)(1) of the Act, 21 U.S.C.section 360bbb-3(b)(1), unless the authorization is terminated  or revoked sooner.       Influenza A by PCR NEGATIVE NEGATIVE Final   Influenza B by PCR NEGATIVE NEGATIVE Final    Comment: (NOTE) The Xpert Xpress SARS-CoV-2/FLU/RSV plus assay is intended as an aid in the diagnosis of influenza from Nasopharyngeal swab specimens and should not be used as a sole basis for treatment. Nasal washings and aspirates are unacceptable for Xpert Xpress SARS-CoV-2/FLU/RSV testing.  Fact Sheet for Patients: EntrepreneurPulse.com.au  Fact Sheet for Healthcare Providers: IncredibleEmployment.be  This test is not yet approved or cleared by the [place] FDA and has been authorized for detection and/or diagnosis of SARS-CoV-2 by FDA under an Emergency Use Authorization (EUA). This EUA will remain in effect (meaning this test can be used) for the duration of the COVID-19 declaration under Section 564(b)(1) of the Act, 21 U.S.C. section 360bbb-3(b)(1), unless the authorization is terminated or revoked.  Performed at [facility] [place] [address] [place], [place] [address]      Signed: Marlowe Aschoff Vernona Peake  [facility] 02/12/2020, 12:02 PM

## 2020-02-16 NOTE — Discharge Summary (Signed)
Physician Discharge Summary  Gerald Valdez [id number] DOB: July 11, 1928 DOA: 02/10/2020  PCP: Prince Solian, MD  Admit date: 02/10/2020 Discharge date: 02/16/2020  Admitted From: Home Discharge disposition: Home with home oxygen   Code Status: Prior  Diet Recommendation: Cardiac diet  Discharge Diagnosis:   Principal Problem:   COPD exacerbation ([place]) Active Problems:   Aortic atherosclerosis ([facility])   Dyspnea on exertion   Essential hypertension   Hyperlipidemia   GERD (gastroesophageal reflux disease)   Chief Complaint  Patient presents with  . Shortness of Breath   Brief narrative: Gerald Valdez is a 87 y.o. male with PMH significant for bladder cancer, COPD and emphysema, Hypertension, hyperlipidemia, GERD. Patient presented to the ED from urgent care  on 1/2 with complaints of productive cough and shortness of breath x 1 week.  Patient states that for the past 5 weeks or so he has had significant dyspnea on exertion without chest pain or palpitations which is limiting his ADLs. He works regularly with his hands and on cars and as of recently he has been unable to do this, which is very unlike him.  Over the past week he has had significant decline in his respiratory status and is much more short of breath, difficulty coughing up sputum with some wheezing but without fever.  He went to the urgent care on 1/2 for evaluation and was found to be hypoxic and was sent to the ED.    In the ED, patient was afebrile, tachycardic, tachypneic, hypertensive, hypoxic (SpO2 87% on room air) placed on 2 L/min.  Sodium 140, K4.4, glucose 124, BUN 26, creatinine 1.32, BNP 199, WBC 13.8, Hb 13.2, platelets 282, lactic acid 1.3. COVID-19 and flu negative.  CXR unremarkable.   CTA chest did not show any evidence of pulmonary embolism but showed healing right sixth rib fracture, diffuse bronchial wall thickening with mild to moderate centrilobular and paraseptal emphysema suggestive of COPD,  aortic atherosclerosis and three-vessel CAD and calcifications of the aortic valve.   Subjective: Patient was seen and examined this morning.  Pleasant elderly Caucasian male.  Lying on bed.  Not in distress.  Feels better than at presentation.  Wife at bedside. Chart reviewed Remains on oxygen 3 L but feels better.  Hospital course: COPD exacerbation Acute respite failure with hypoxia -Presented with increased cough and sputum production, requiring O2 -Emphysema on CT -Currently on doxycycline 100 mg twice daily and IV Solu-Medrol 40 mg  -Discharge with 3 more days of doxycycline and a tapering course of prednisone. -Ambulatory oxygen requirement 3 L/min.  Discharge on home oxygen.  Dyspnea on exertion -CT scan with aortic atherosclerosis, three-vessel CAD including left main artery as well as calcifications of the aortic valve-echo recommended by radiology -Echocardiogram with EF 55 to 60% and grade 1 diastolic dysfunction. -Needs cardiology ischemia evaluation as an outpatient -Ambulatory referral given.  Hypertension Continue home amlodipine and ramipril  Hyperlipidemia Continue simvastatin  GERD Continue Protonix  Aortic atherosclerosis Lipid Panel is satisfactory    Component Value Date/Time   CHOL 158 02/10/2020 1717   TRIG 64 02/10/2020 1717   HDL 65 02/10/2020 1717   CHOLHDL 2.4 02/10/2020 1717   VLDL 13 02/10/2020 1717   LDLCALC 80 02/10/2020 1717   Stable discharge home today.  Wound care:    Discharge Exam:   Vitals:   02/12/20 0421 02/12/20 0810 02/12/20 1032 02/12/20 1039  BP: 134/78  137/83   Pulse: 82  (!) 111   Resp: Marland Kitchen)  21  20 (!) 22  Temp: 97.7 F (36.5 C)     TempSrc: Oral     SpO2: 99% 94% 98%   Weight:      Height:        Body mass index is 23.77 kg/m.  General exam: Pleasant, elderly Caucasian male.  Not in distress Skin: No rashes, lesions or ulcers. HEENT: Atraumatic, normocephalic, no obvious bleeding Lungs: Clear to  auscultate bilaterally CVS: Regular rate and rhythm, no murmur GI/Abd soft, nontender, nondistended, bowel sound present CNS: Alert, awake, oriented x3 Psychiatry: Mood appropriate Extremities: No pedal edema, no calf tenderness  Follow ups:   Discharge Instructions    Ambulatory referral to Cardiology   Complete by: As directed    Coronary calcifications noted in all coronary vessels   Diet - low sodium heart healthy   Complete by: As directed    Increase activity slowly   Complete by: As directed       Follow-up Information    Avva, Ravisankar, MD Follow up.   Specialty: Internal Medicine Contact information: [address] [address] [address] [place] Follow up.   Why: The above company will supply Oxygen, if you have any problems or questions please call the above number.  Contact information: [place] [address] [phone]               Recommendations for Outpatient Follow-Up:   1. Follow-up with PCP as an outpatient 2. Ambulatory referral to cardiology given  Discharge Instructions:  Follow with Primary MD Avva, Ravisankar, MD in 7 days   Get CBC/BMP checked in next visit within 1 week by PCP or SNF MD ( we routinely change or add medications that can affect your baseline labs and fluid status, therefore we recommend that you get the mentioned basic workup next visit with your PCP, your PCP may decide not to get them or add new tests based on their clinical decision)  On your next visit with your PCP, please Get Medicines reviewed and adjusted.  Please request your PCP  to go over all Hospital Tests and Procedure/Radiological results at the follow up, please get all Hospital records sent to your Prim MD by signing hospital release before you go home.  Activity: As tolerated with Full fall precautions use walker/cane & assistance as needed  For Heart failure patients - Check your Weight same time  everyday, if you gain over 2 pounds, or you develop in leg swelling, experience more shortness of breath or chest pain, call your Primary MD immediately. Follow Cardiac Low Salt Diet and 1.5 lit/day fluid restriction.  If you have smoked or chewed Tobacco in the last 2 yrs please stop smoking, stop any regular Alcohol  and or any Recreational drug use.  If you experience worsening of your admission symptoms, develop shortness of breath, life threatening emergency, suicidal or homicidal thoughts you must seek medical attention immediately by calling 911 or calling your MD immediately  if symptoms less severe.  You Must read complete instructions/literature along with all the possible adverse reactions/side effects for all the Medicines you take and that have been prescribed to you. Take any new Medicines after you have completely understood and accpet all the possible adverse reactions/side effects.   Do not drive, operate heavy machinery, perform activities at heights, swimming or participation in water activities or provide baby sitting services if your were admitted for syncope  or siezures until you have seen by Primary MD or a Neurologist and advised to do so again.  Do not drive when taking Pain medications.  Do not take more than prescribed Pain, Sleep and Anxiety Medications  Wear Seat belts while driving.   Please note You were cared for by a hospitalist during your hospital stay. If you have any questions about your discharge medications or the care you received while you were in the hospital after you are discharged, you can call the unit and asked to speak with the hospitalist on call if the hospitalist that took care of you is not available. Once you are discharged, your primary care physician will handle any further medical issues. Please note that NO REFILLS for any discharge medications will be authorized once you are discharged, as it is imperative that you return to your primary care  physician (or establish a relationship with a primary care physician if you do not have one) for your aftercare needs so that they can reassess your need for medications and monitor your lab values.    Allergies as of 02/12/2020   No Known Allergies     Medication List    TAKE these medications   albuterol 108 (90 Base) MCG/ACT inhaler Commonly known as: VENTOLIN HFA Inhale 2 puffs into the lungs every 6 (six) hours as needed for wheezing or shortness of breath.   amLODipine 5 MG tablet Commonly known as: NORVASC Take 5 mg by mouth daily with breakfast.   Anoro Ellipta 62.5-25 MCG/INH Aepb Generic drug: umeclidinium-vilanterol Inhale 1 puff into the lungs daily.   escitalopram 10 MG tablet Commonly known as: LEXAPRO Take 10 mg by mouth daily.   loratadine 10 MG tablet Commonly known as: CLARITIN Take 10 mg by mouth daily.   multivitamin with minerals Tabs tablet Take 1 tablet by mouth daily with breakfast.   pantoprazole 40 MG tablet Commonly known as: Protonix Take 1 tablet (40 mg total) by mouth 2 (two) times daily.   predniSONE 10 MG tablet Commonly known as: DELTASONE Take 4 tablets daily X 2 days, then, Take 3 tablets daily X 2 days, then, Take 2 tablets daily X 2 days, then, Take 1 tablets daily X 1 day.   ramipril 10 MG capsule Commonly known as: ALTACE Take 10 mg by mouth daily with breakfast.   simvastatin 40 MG tablet Commonly known as: ZOCOR Take 40 mg by mouth at bedtime.     ASK your doctor about these medications   doxycycline 100 MG tablet Commonly known as: VIBRA-TABS Take 1 tablet (100 mg total) by mouth every 12 (twelve) hours for 3 days. Ask about: Should I take this medication?       Time coordinating discharge: 35 minutes  The results of significant diagnostics from this hospitalization (including imaging, microbiology, ancillary and laboratory) are listed below for reference.    Procedures and Diagnostic Studies:   DG Chest 2  View  Result Date: 02/10/2020 CLINICAL DATA:  Productive cough, shortness of breath for 5 days. EXAM: CHEST - 2 VIEW COMPARISON:  November 11, 2007 FINDINGS: The heart size and mediastinal contours are within normal limits. Both lungs are clear. The visualized skeletal structures are unremarkable. IMPRESSION: No active cardiopulmonary disease. Electronically Signed   By: Abelardo Diesel M.D.   On: 02/10/2020 14:11   CT Angio Chest PE W and/or Wo Contrast  Result Date: 02/10/2020 CLINICAL DATA:  87 year old male with history of shortness of breath and chest pain. High  probability of pulmonary embolism. EXAM: CT ANGIOGRAPHY CHEST WITH CONTRAST TECHNIQUE: Multidetector CT imaging of the chest was performed using the standard protocol during bolus administration of intravenous contrast. Multiplanar CT image reconstructions and MIPs were obtained to evaluate the vascular anatomy. CONTRAST:  19mL OMNIPAQUE IOHEXOL 350 MG/ML SOLN COMPARISON:  No priors. FINDINGS: Cardiovascular: No filling defects within the pulmonary arterial tree to suggest underlying pulmonary embolism. Heart size is normal. There is no significant pericardial fluid, thickening or pericardial calcification. There is aortic atherosclerosis, as well as atherosclerosis of the great vessels of the mediastinum and the coronary arteries, including calcified atherosclerotic plaque in the left main, left anterior descending, left circumflex and right coronary arteries. Thickening calcification of the aortic valve. Mediastinum/Nodes: No pathologically enlarged mediastinal or hilar lymph nodes. Esophagus is unremarkable in appearance. No axillary lymphadenopathy. Lungs/Pleura: No acute consolidative airspace disease. No pleural effusions. No definite suspicious appearing pulmonary nodules or masses are noted. Upper Abdomen: Aortic atherosclerosis. Musculoskeletal: Chronic appearing compression fracture of T6 with 50% loss of anterior vertebral body height.  Healing nondisplaced fracture of the anterolateral aspect of the right sixth rib incidentally noted. There are no aggressive appearing lytic or blastic lesions noted in the visualized portions of the skeleton. Review of the MIP images confirms the above findings. IMPRESSION: 1. No evidence of pulmonary embolism. 2. No acute findings in the thorax to account for the patient's symptoms. 3. Healing fracture of the anterolateral aspect of the right sixth rib. 4. Diffuse bronchial wall thickening with mild to moderate centrilobular and paraseptal emphysema; imaging findings suggestive of underlying COPD. 5. Aortic atherosclerosis, in addition to left main and 3 vessel coronary artery disease. 6. There are calcifications of the aortic valve. Echocardiographic correlation for evaluation of potential valvular dysfunction may be warranted if clinically indicated. Aortic Atherosclerosis (ICD10-I70.0) and Emphysema (ICD10-J43.9). Electronically Signed   By: Vinnie Langton M.D.   On: 02/10/2020 15:36   ECHOCARDIOGRAM COMPLETE  Result Date: 02/11/2020    ECHOCARDIOGRAM REPORT   Patient Name:   ZMARI RANGER Date of Exam: 02/11/2020 Medical Rec #:  [id number]       Height:       64.5 in Accession #:    [id number]      Weight:       140.7 lb Date of Birth:  May 27, 1928       BSA:          1.694 m Patient Age:    64 years        BP:           134/72 mmHg Patient Gender: M               HR:           113 bpm. Exam Location:  Inpatient Procedure: 2D Echo, Cardiac Doppler and Color Doppler Indications:    R06.02 SOB  History:        Patient has no prior history of Echocardiogram examinations.                 COPD, Signs/Symptoms:Dyspnea and Shortness of Breath; Risk                 Factors:Hypertension. Cancer.  Sonographer:    Roseanna Rainbow RDCS Referring Phys: [id number] Harold Hedge  Sonographer Comments: Technically difficult study due to poor echo windows, suboptimal parasternal window and suboptimal apical window. Image acquisition  challenging due to respiratory motion. IMPRESSIONS  1. Left ventricular ejection fraction, by estimation, is  55 to 60%. The left ventricle has normal function. The left ventricle has no regional wall motion abnormalities. Left ventricular diastolic parameters are consistent with Grade I diastolic dysfunction (impaired relaxation).  2. Right ventricular systolic function is normal. The right ventricular size is normal.  3. The mitral valve is normal in structure. No evidence of mitral valve regurgitation. No evidence of mitral stenosis.  4. There is a 1 cm x 0.8 cm calcified, mobile density in the LVOT between the aortic valve and anterior mitral valve leaflet. Consider TEE to better evaluate. The aortic valve is calcified. There is moderate calcification of the aortic valve. There is moderate thickening of the aortic valve. Aortic valve regurgitation is not visualized. No aortic stenosis is present.  5. The inferior vena cava is normal in size with greater than 50% respiratory variability, suggesting right atrial pressure of 3 mmHg. FINDINGS  Left Ventricle: Left ventricular ejection fraction, by estimation, is 55 to 60%. The left ventricle has normal function. The left ventricle has no regional wall motion abnormalities. The left ventricular internal cavity size was normal in size. There is  no left ventricular hypertrophy. Left ventricular diastolic parameters are consistent with Grade I diastolic dysfunction (impaired relaxation). Indeterminate filling pressures. Right Ventricle: The right ventricular size is normal. No increase in right ventricular wall thickness. Right ventricular systolic function is normal. Left Atrium: Left atrial size was normal in size. Right Atrium: Right atrial size was normal in size. Pericardium: There is no evidence of pericardial effusion. Mitral Valve: The mitral valve is normal in structure. No evidence of mitral valve regurgitation. No evidence of mitral valve stenosis. MV peak  gradient, 7.3 mmHg. The mean mitral valve gradient is 2.0 mmHg. Tricuspid Valve: The tricuspid valve is normal in structure. Tricuspid valve regurgitation is not demonstrated. No evidence of tricuspid stenosis. Aortic Valve: There is a 1 cm x 0.8 cm calcified, mobile density in the LVOT between the aortic valve and anterior mitral valve leaflet. Consider TEE to better evaluate. The aortic valve is calcified. There is moderate calcification of the aortic valve. There is moderate thickening of the aortic valve. Aortic valve regurgitation is not visualized. No aortic stenosis is present. Aortic valve mean gradient measures 6.0 mmHg. Aortic valve peak gradient measures 10.9 mmHg. Aortic valve area, by VTI measures  3.43 cm. Pulmonic Valve: The pulmonic valve was normal in structure. Pulmonic valve regurgitation is not visualized. No evidence of pulmonic stenosis. Aorta: The aortic root is normal in size and structure. Venous: The inferior vena cava is normal in size with greater than 50% respiratory variability, suggesting right atrial pressure of 3 mmHg. IAS/Shunts: No atrial level shunt detected by color flow Doppler.  LEFT VENTRICLE PLAX 2D LVIDd:         4.70 cm     Diastology LVIDs:         3.45 cm     LV e' medial:    7.62 cm/s LV PW:         1.00 cm     LV E/e' medial:  15.3 LV IVS:        1.00 cm     LV e' lateral:   6.31 cm/s LVOT diam:     2.40 cm     LV E/e' lateral: 18.5 LV SV:         90 LV SV Index:   53 LVOT Area:     4.52 cm  LV Volumes (MOD) LV vol d, MOD A2C: 60.2 ml  LV vol d, MOD A4C: 51.7 ml LV vol s, MOD A2C: 27.9 ml LV vol s, MOD A4C: 22.7 ml LV SV MOD A2C:     32.3 ml LV SV MOD A4C:     51.7 ml LV SV MOD BP:      32.3 ml RIGHT VENTRICLE             IVC RV S prime:     14.00 cm/s  IVC diam: 1.90 cm TAPSE (M-mode): 2.2 cm LEFT ATRIUM             Index       RIGHT ATRIUM           Index LA diam:        3.50 cm 2.07 cm/m  RA Area:     12.20 cm LA Vol (A2C):   28.0 ml 16.53 ml/m RA Volume:   25.50  ml  15.06 ml/m LA Vol (A4C):   23.1 ml 13.64 ml/m LA Biplane Vol: 27.4 ml 16.18 ml/m  AORTIC VALVE AV Area (Vmax):    3.07 cm AV Area (Vmean):   2.90 cm AV Area (VTI):     3.43 cm AV Vmax:           165.00 cm/s AV Vmean:          116.000 cm/s AV VTI:            0.264 m AV Peak Grad:      10.9 mmHg AV Mean Grad:      6.0 mmHg LVOT Vmax:         112.00 cm/s LVOT Vmean:        74.400 cm/s LVOT VTI:          0.200 m LVOT/AV VTI ratio: 0.76  AORTA Ao Root diam: 3.30 cm MITRAL VALVE MV Area (PHT): 3.75 cm     SHUNTS MV Peak grad:  7.3 mmHg     Systemic VTI:  0.20 m MV Mean grad:  2.0 mmHg     Systemic Diam: 2.40 cm MV Vmax:       1.35 m/s MV Vmean:      62.0 cm/s MV Decel Time: 203 msec MV E velocity: 116.90 cm/s MV A velocity: 116.50 cm/s MV E/A ratio:  1.00 Skeet Latch MD Electronically signed by Skeet Latch MD Signature Date/Time: 02/11/2020/5:11:33 PM    Final      Labs:   Basic Metabolic Panel: Recent Labs  Lab 02/10/20 1412 02/11/20 0443 02/12/20 0400  NA 140 140 139  K 4.4 4.3 4.2  CL 103 106 104  CO2 25 23 21*  GLUCOSE 124* 139* 126*  BUN 26* 28* 40*  CREATININE 1.32* 1.18 1.26*  CALCIUM 9.3 9.2 9.4   GFR Estimated Creatinine Clearance: 34 mL/min (A) (by C-G formula based on SCr of 1.26 mg/dL (H)). Liver Function Tests: No results for input(s): AST, ALT, ALKPHOS, BILITOT, PROT, ALBUMIN in the last 168 hours. No results for input(s): LIPASE, AMYLASE in the last 168 hours. No results for input(s): AMMONIA in the last 168 hours. Coagulation profile No results for input(s): INR, PROTIME in the last 168 hours.  CBC: Recent Labs  Lab 02/10/20 1412 02/11/20 0443 02/12/20 0400  WBC 13.8* 13.1* 22.5*  HGB 13.2 12.1* 12.2*  HCT 42.0 38.1* 38.6*  MCV 92.3 91.8 92.8  PLT 282 258 275   Cardiac Enzymes: No results for input(s): CKTOTAL, CKMB, CKMBINDEX, TROPONINI in the last 168 hours. BNP: Invalid input(s): POCBNP CBG:  No results for input(s): GLUCAP in the last 168  hours. D-Dimer No results for input(s): DDIMER in the last 72 hours. Hgb A1c No results for input(s): HGBA1C in the last 72 hours. Lipid Profile No results for input(s): CHOL, HDL, LDLCALC, TRIG, CHOLHDL, LDLDIRECT in the last 72 hours. Thyroid function studies No results for input(s): TSH, T4TOTAL, T3FREE, THYROIDAB in the last 72 hours.  Invalid input(s): FREET3 Anemia work up No results for input(s): VITAMINB12, FOLATE, FERRITIN, TIBC, IRON, RETICCTPCT in the last 72 hours. Microbiology Recent Results (from the past 240 hour(s))  Resp Panel by RT-PCR (Flu A&B, Covid) Nasopharyngeal Swab     Status: None   Collection Time: 02/10/20 12:48 PM   Specimen: Nasopharyngeal Swab; Nasopharyngeal(NP) swabs in vial transport medium  Result Value Ref Range Status   SARS Coronavirus 2 by RT PCR NEGATIVE NEGATIVE Final    Comment: (NOTE) SARS-CoV-2 target nucleic acids are NOT DETECTED.  The SARS-CoV-2 RNA is generally detectable in upper respiratory specimens during the acute phase of infection. The lowest concentration of SARS-CoV-2 viral copies this assay can detect is 138 copies/mL. A negative result does not preclude SARS-Cov-2 infection and should not be used as the sole basis for treatment or other patient management decisions. A negative result may occur with  improper specimen collection/handling, submission of specimen other than nasopharyngeal swab, presence of viral mutation(s) within the areas targeted by this assay, and inadequate number of viral copies(<138 copies/mL). A negative result must be combined with clinical observations, patient history, and epidemiological information. The expected result is Negative.  Fact Sheet for Patients:  EntrepreneurPulse.com.au  Fact Sheet for Healthcare Providers:  IncredibleEmployment.be  This test is no t yet approved or cleared by the [place] FDA and  has been authorized for detection  and/or diagnosis of SARS-CoV-2 by FDA under an Emergency Use Authorization (EUA). This EUA will remain  in effect (meaning this test can be used) for the duration of the COVID-19 declaration under Section 564(b)(1) of the Act, 21 U.S.C.section 360bbb-3(b)(1), unless the authorization is terminated  or revoked sooner.       Influenza A by PCR NEGATIVE NEGATIVE Final   Influenza B by PCR NEGATIVE NEGATIVE Final    Comment: (NOTE) The Xpert Xpress SARS-CoV-2/FLU/RSV plus assay is intended as an aid in the diagnosis of influenza from Nasopharyngeal swab specimens and should not be used as a sole basis for treatment. Nasal washings and aspirates are unacceptable for Xpert Xpress SARS-CoV-2/FLU/RSV testing.  Fact Sheet for Patients: EntrepreneurPulse.com.au  Fact Sheet for Healthcare Providers: IncredibleEmployment.be  This test is not yet approved or cleared by the [place] FDA and has been authorized for detection and/or diagnosis of SARS-CoV-2 by FDA under an Emergency Use Authorization (EUA). This EUA will remain in effect (meaning this test can be used) for the duration of the COVID-19 declaration under Section 564(b)(1) of the Act, 21 U.S.C. section 360bbb-3(b)(1), unless the authorization is terminated or revoked.  Performed at [facility], [place] [facility] [address], [place], [place] [address]      Signed: Marlowe Aschoff Dakisha Schoof  [facility] 02/16/2020, 7:19 AM

## 2020-02-22 DIAGNOSIS — J9601 Acute respiratory failure with hypoxia: Secondary | ICD-10-CM | POA: Diagnosis not present

## 2020-02-22 DIAGNOSIS — J441 Chronic obstructive pulmonary disease with (acute) exacerbation: Secondary | ICD-10-CM | POA: Diagnosis not present

## 2020-02-22 DIAGNOSIS — I2510 Atherosclerotic heart disease of native coronary artery without angina pectoris: Secondary | ICD-10-CM | POA: Diagnosis not present

## 2020-02-22 DIAGNOSIS — I700 Atherosclerosis of aorta: Secondary | ICD-10-CM | POA: Diagnosis not present

## 2020-02-22 DIAGNOSIS — I129 Hypertensive chronic kidney disease with stage 1 through stage 4 chronic kidney disease, or unspecified chronic kidney disease: Secondary | ICD-10-CM | POA: Diagnosis not present

## 2020-02-22 DIAGNOSIS — N1832 Chronic kidney disease, stage 3b: Secondary | ICD-10-CM | POA: Diagnosis not present

## 2020-02-22 DIAGNOSIS — R0600 Dyspnea, unspecified: Secondary | ICD-10-CM | POA: Diagnosis not present

## 2020-02-22 DIAGNOSIS — E785 Hyperlipidemia, unspecified: Secondary | ICD-10-CM | POA: Diagnosis not present

## 2020-03-03 NOTE — Progress Notes (Addendum)
Cardiology Office Note:    Date:  03/04/2020   ID:  BURK HOCTOR, DOB 1928/08/22, MRN [id number]  PCP:  Prince Solian, MD  [facility] Cardiologist:  [facility] Electrophysiologist:  None   Referring MD: Reginold Agent, NP   Chief Complaint  Patient presents with  . Shortness of Breath     Jan. 25, 2022   CLANCE BAQUERO is a 87 y.o. male with a hx of  Bladder cancer, COPD, emphasemia, HTN, HLD who we are asked to see today by Dr. Dagmar Hait for further evaluation and management of his coronary artery calcifications.  Hx of COPD / emphasemia,   He recently presented to the [facility] with increasing dyspnea. Ct of the chest revealed 3 V coronary calcificaiton WBC was 22K on the day of discharge - thought to be due to his steroids  He was treated with Abx.   Echocardiogram performed on February 11, 2020 reveals normal left ventricular systolic function with an ejection fraction of 55 to 60%.  He has grade 1 diastolic dysfunction.  Is now on home O2.  Denies any chest pain .  No exercise for the past several week. 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   He was on ASA until he developed an ulcer    Past Medical History:  Diagnosis Date  . Acute duodenal ulcer with bleeding 01/30/2014  . Acute post-hemorrhagic anemia 01/30/2014  . Aortic atherosclerosis ([place]) 02/10/2020  . Cancer ([place])    bladder  . COPD exacerbation ([place]) 02/10/2020  . Dyspnea on exertion 02/10/2020  . Emphysema of lung ([place])   . Essential hypertension 02/10/2020  . GERD (gastroesophageal reflux disease) 02/10/2020  . GI bleed 01/30/2014  . Hyperlipidemia 02/10/2020    Past Surgical History:  Procedure Laterality Date  . BLADDER SURGERY    . ESOPHAGOGASTRODUODENOSCOPY N/A 01/30/2014   Procedure: ESOPHAGOGASTRODUODENOSCOPY (EGD);  Surgeon: Inda Castle, MD;  Location: [name];  Service:  Endoscopy;  Laterality: N/A;    Current Medications: Current Meds  Medication Sig  . albuterol (PROVENTIL HFA;VENTOLIN HFA) 108 (90 BASE) MCG/ACT inhaler Inhale 2 puffs into the lungs every 6 (six) hours as needed for wheezing or shortness of breath.  Marland Kitchen amLODipine (NORVASC) 5 MG tablet Take 5 mg by mouth daily with breakfast.  . escitalopram (LEXAPRO) 10 MG tablet Take 10 mg by mouth daily.  Marland Kitchen loratadine (CLARITIN) 10 MG tablet Take 10 mg by mouth daily.  . Multiple Vitamin (MULTIVITAMIN WITH MINERALS) TABS tablet Take 1 tablet by mouth daily with breakfast.  . pantoprazole (PROTONIX) 40 MG tablet Take 1 tablet (40 mg total) by mouth 2 (two) times daily.  . ramipril (ALTACE) 10 MG capsule Take 10 mg by mouth daily with breakfast.  . simvastatin (ZOCOR) 40 MG tablet Take 40 mg by mouth at bedtime.  Marland Kitchen umeclidinium-vilanterol (ANORO ELLIPTA) 62.5-25 MCG/INH AEPB Inhale 1 puff into the lungs daily.  . [DISCONTINUED] predniSONE (DELTASONE) 10 MG tablet Take 4 tablets daily X 2 days, then, Take 3 tablets daily X 2 days, then, Take 2 tablets daily X 2 days, then, Take 1 tablets daily X 1 day.     Allergies:   Patient has no known allergies.   Social History   Socioeconomic History  . Marital status: Married    Spouse name: Not on file  . Number of children: Not on  file  . Years of education: Not on file  . Highest education level: Not on file  Occupational History  . Occupation: retired  Tobacco Use  . Smoking status: Former Smoker    Years: 47.00    Types: Cigarettes    Start date: 1947    Quit date: 1994    Years since quitting: 28.0  . Smokeless tobacco: Never Used  Vaping Use  . Vaping Use: Never used  Substance and Sexual Activity  . Alcohol use: Not Currently  . Drug use: Never  . Sexual activity: Not on file  Other Topics Concern  . Not on file  Social History Narrative  . Not on file   Social Determinants of Health   Financial Resource Strain: Not on file  Food  Insecurity: Not on file  Transportation Needs: Not on file  Physical Activity: Not on file  Stress: Not on file  Social Connections: Not on file     Family History: The patient's family history includes Stroke in his father.  ROS:   Please see the history of present illness.     All other systems reviewed and are negative.  EKGs/Labs/Other Studies Reviewed:    The following studies were reviewed today:   EKG:    Recent Labs: 02/10/2020: B Natriuretic Peptide 199.8 02/12/2020: BUN 40; Creatinine, Ser 1.26; Hemoglobin 12.2; Platelets 275; Potassium 4.2; Sodium 139  Recent Lipid Panel    Component Value Date/Time   CHOL 158 02/10/2020 1717   TRIG 64 02/10/2020 1717   HDL 65 02/10/2020 1717   CHOLHDL 2.4 02/10/2020 1717   VLDL 13 02/10/2020 1717   LDLCALC 80 02/10/2020 1717     Risk Assessment/Calculations:       Physical Exam:    VS:  BP 110/62   Pulse 95   Ht 5' 4.5" (1.638 m)   Wt 142 lb 12.8 oz (64.8 kg)   SpO2 95%   BMI 24.13 kg/m     Wt Readings from Last 3 Encounters:  03/04/20 142 lb 12.8 oz (64.8 kg)  02/10/20 140 lb 10.5 oz (63.8 kg)  01/31/14 159 lb 9.6 oz (72.4 kg)     GEN:  Elderly  Male,  Wearing home O2. ,  Appears somewhat frail.  HEENT: Normal NECK: No JVD; No carotid bruits LYMPHATICS: No lymphadenopathy CARDIAC: RRR, no murmurs, rubs, gallops RESPIRATORY:  Mild bilateral wheezing  ABDOMEN: Soft, non-tender, non-distended MUSCULOSKELETAL:  No edema; No deformity  SKIN: Warm and dry NEUROLOGIC:  Alert and oriented x 3 PSYCHIATRIC:  Normal affect   ASSESSMENT:    1. Aortic atherosclerosis ([place])    PLAN:       1. Coronary artery calcifications: [place]  presents for further discussion of three-vessel coronary artery calcifications.  He denies having any episodes of angina.  This was incidentally found when he had a CT scan during a recent hospitalization.  He has well-preserved left ventricular systolic function.  At this point I do  not think that we necessarily need to pursue any invasive or interventional procedures.  He is doing quite well and I think we should continue with medical therapy.  I will discuss further with his primary medical doctor.  We will have him see my APP in 6 months.    Medication Adjustments/Labs and Tests Ordered: Current medicines are reviewed at length with the patient today.  Concerns regarding medicines are outlined above.  No orders of the defined types were placed in this encounter.  No orders of  the defined types were placed in this encounter.   Patient Instructions  Medication Instructions:  Your provider recommends that you continue on your current medications as directed. Please refer to the Current Medication list given to you today.   *If you need a refill on your cardiac medications before your next appointment, please call your pharmacy*  Follow-Up: At [facility], you and your health needs are our priority.  As part of our continuing mission to provide you with exceptional heart care, we have created designated Provider Care Teams.  These Care Teams include your primary Cardiologist (physician) and Advanced Practice Providers (APPs -  Physician Assistants and Nurse Practitioners) who all work together to provide you with the care you need, when you need it. Your next appointment:   6 month(s) The format for your next appointment:   In Person Provider:   You will see one of the following Advanced Practice Providers on your designated Care Team:    Richardson Dopp, PA-C  Robbie Lis, [place]    Signed, Mertie Moores, MD  03/04/2020 5:05 PM    [place]

## 2020-03-04 ENCOUNTER — Ambulatory Visit (INDEPENDENT_AMBULATORY_CARE_PROVIDER_SITE_OTHER): Payer: Medicare Other | Admitting: Cardiovascular Disease

## 2020-03-04 ENCOUNTER — Other Ambulatory Visit: Payer: Self-pay

## 2020-03-04 ENCOUNTER — Encounter: Payer: Self-pay | Admitting: Cardiovascular Disease

## 2020-03-04 VITALS — BP 110/62 | HR 95 | Ht 64.5 in | Wt 142.8 lb

## 2020-03-04 DIAGNOSIS — I700 Atherosclerosis of aorta: Secondary | ICD-10-CM

## 2020-03-04 NOTE — Patient Instructions (Signed)
Medication Instructions:  Your provider recommends that you continue on your current medications as directed. Please refer to the Current Medication list given to you today.   *If you need a refill on your cardiac medications before your next appointment, please call your pharmacy*  Follow-Up: At [facility], you and your health needs are our priority.  As part of our continuing mission to provide you with exceptional heart care, we have created designated Provider Care Teams.  These Care Teams include your primary Cardiologist (physician) and Advanced Practice Providers (APPs -  Physician Assistants and Nurse Practitioners) who all work together to provide you with the care you need, when you need it. Your next appointment:   6 month(s) The format for your next appointment:   In Person Provider:   You will see one of the following Advanced Practice Providers on your designated Care Team:    Scott Weaver, PA-C  Vin Bhagat, PA-C 

## 2020-04-01 DIAGNOSIS — L819 Disorder of pigmentation, unspecified: Secondary | ICD-10-CM | POA: Diagnosis not present

## 2020-04-01 DIAGNOSIS — D485 Neoplasm of uncertain behavior of skin: Secondary | ICD-10-CM | POA: Diagnosis not present

## 2020-04-01 DIAGNOSIS — Z85828 Personal history of other malignant neoplasm of skin: Secondary | ICD-10-CM | POA: Diagnosis not present

## 2020-04-01 DIAGNOSIS — L905 Scar conditions and fibrosis of skin: Secondary | ICD-10-CM | POA: Diagnosis not present

## 2020-05-08 DIAGNOSIS — I700 Atherosclerosis of aorta: Secondary | ICD-10-CM | POA: Diagnosis not present

## 2020-05-08 DIAGNOSIS — I471 Supraventricular tachycardia: Secondary | ICD-10-CM | POA: Diagnosis not present

## 2020-05-08 DIAGNOSIS — I2510 Atherosclerotic heart disease of native coronary artery without angina pectoris: Secondary | ICD-10-CM | POA: Diagnosis not present

## 2020-05-08 DIAGNOSIS — F32A Depression, unspecified: Secondary | ICD-10-CM | POA: Diagnosis not present

## 2020-05-08 DIAGNOSIS — J441 Chronic obstructive pulmonary disease with (acute) exacerbation: Secondary | ICD-10-CM | POA: Diagnosis not present

## 2020-05-08 DIAGNOSIS — I129 Hypertensive chronic kidney disease with stage 1 through stage 4 chronic kidney disease, or unspecified chronic kidney disease: Secondary | ICD-10-CM | POA: Diagnosis not present

## 2020-05-08 DIAGNOSIS — N1832 Chronic kidney disease, stage 3b: Secondary | ICD-10-CM | POA: Diagnosis not present

## 2020-05-08 DIAGNOSIS — E785 Hyperlipidemia, unspecified: Secondary | ICD-10-CM | POA: Diagnosis not present

## 2020-05-08 DIAGNOSIS — J9601 Acute respiratory failure with hypoxia: Secondary | ICD-10-CM | POA: Diagnosis not present

## 2020-05-08 DIAGNOSIS — R0600 Dyspnea, unspecified: Secondary | ICD-10-CM | POA: Diagnosis not present

## 2020-05-08 DIAGNOSIS — M1990 Unspecified osteoarthritis, unspecified site: Secondary | ICD-10-CM | POA: Diagnosis not present

## 2020-05-08 DIAGNOSIS — C679 Malignant neoplasm of bladder, unspecified: Secondary | ICD-10-CM | POA: Diagnosis not present

## 2020-06-30 DIAGNOSIS — D485 Neoplasm of uncertain behavior of skin: Secondary | ICD-10-CM | POA: Diagnosis not present

## 2020-06-30 DIAGNOSIS — L218 Other seborrheic dermatitis: Secondary | ICD-10-CM | POA: Diagnosis not present

## 2020-06-30 DIAGNOSIS — C44319 Basal cell carcinoma of skin of other parts of face: Secondary | ICD-10-CM | POA: Diagnosis not present

## 2020-08-08 DIAGNOSIS — E785 Hyperlipidemia, unspecified: Secondary | ICD-10-CM | POA: Diagnosis not present

## 2020-08-08 DIAGNOSIS — Z125 Encounter for screening for malignant neoplasm of prostate: Secondary | ICD-10-CM | POA: Diagnosis not present

## 2020-08-15 DIAGNOSIS — R0600 Dyspnea, unspecified: Secondary | ICD-10-CM | POA: Diagnosis not present

## 2020-08-15 DIAGNOSIS — I700 Atherosclerosis of aorta: Secondary | ICD-10-CM | POA: Diagnosis not present

## 2020-08-15 DIAGNOSIS — F32A Depression, unspecified: Secondary | ICD-10-CM | POA: Diagnosis not present

## 2020-08-15 DIAGNOSIS — J9601 Acute respiratory failure with hypoxia: Secondary | ICD-10-CM | POA: Diagnosis not present

## 2020-08-15 DIAGNOSIS — I471 Supraventricular tachycardia: Secondary | ICD-10-CM | POA: Diagnosis not present

## 2020-08-15 DIAGNOSIS — I2510 Atherosclerotic heart disease of native coronary artery without angina pectoris: Secondary | ICD-10-CM | POA: Diagnosis not present

## 2020-08-15 DIAGNOSIS — J441 Chronic obstructive pulmonary disease with (acute) exacerbation: Secondary | ICD-10-CM | POA: Diagnosis not present

## 2020-08-15 DIAGNOSIS — Z0000 Encounter for general adult medical examination without abnormal findings: Secondary | ICD-10-CM | POA: Diagnosis not present

## 2020-08-15 DIAGNOSIS — M1990 Unspecified osteoarthritis, unspecified site: Secondary | ICD-10-CM | POA: Diagnosis not present

## 2020-08-15 DIAGNOSIS — N1832 Chronic kidney disease, stage 3b: Secondary | ICD-10-CM | POA: Diagnosis not present

## 2020-08-15 DIAGNOSIS — E785 Hyperlipidemia, unspecified: Secondary | ICD-10-CM | POA: Diagnosis not present

## 2020-08-15 DIAGNOSIS — R82998 Other abnormal findings in urine: Secondary | ICD-10-CM | POA: Diagnosis not present

## 2020-08-15 DIAGNOSIS — I129 Hypertensive chronic kidney disease with stage 1 through stage 4 chronic kidney disease, or unspecified chronic kidney disease: Secondary | ICD-10-CM | POA: Diagnosis not present

## 2020-09-08 ENCOUNTER — Encounter: Payer: Self-pay | Admitting: Physician Assistant

## 2020-09-08 ENCOUNTER — Ambulatory Visit (INDEPENDENT_AMBULATORY_CARE_PROVIDER_SITE_OTHER): Payer: Medicare Other | Admitting: Physician Assistant

## 2020-09-08 ENCOUNTER — Other Ambulatory Visit: Payer: Self-pay

## 2020-09-08 VITALS — BP 112/52 | HR 68 | Ht 67.0 in | Wt 138.2 lb

## 2020-09-08 DIAGNOSIS — E782 Mixed hyperlipidemia: Secondary | ICD-10-CM

## 2020-09-08 DIAGNOSIS — I2510 Atherosclerotic heart disease of native coronary artery without angina pectoris: Secondary | ICD-10-CM | POA: Diagnosis not present

## 2020-09-08 DIAGNOSIS — I10 Essential (primary) hypertension: Secondary | ICD-10-CM

## 2020-09-08 DIAGNOSIS — I700 Atherosclerosis of aorta: Secondary | ICD-10-CM | POA: Diagnosis not present

## 2020-09-08 NOTE — Progress Notes (Signed)
Cardiology Office Note:    Date:  09/08/2020   ID:  Gerald Valdez, DOB October 20, 1928, MRN [id number]  PCP:  Prince Solian, MD   [facility] Providers Cardiologist:  Mertie Moores, MD      Referring MD: Prince Solian, MD   Chief Complaint:  Follow-up (CAD )    Patient Profile:    Gerald Valdez is a 87 y.o. male with:  Coronary artery calcification  Bladder CA COPD/emphysema on home O2 Hypertension  Hyperlipidemia  Peptic Ulcer Disease Aortic atherosclerosis  GERD   Prior CV studies: Echocardiogram 02/11/2020 EF 55-60, no RWMA, GR 1 DD, normal RVSF, calcified aortic valve with 1 cm x 0.8 cm calcified mobile density in the LVOT between the aortic valve and anterior mitral valve leaflet, no aortic stenosis  Chest CTA 02/10/20  IMPRESSION: 1. No evidence of pulmonary embolism. 2. No acute findings in the thorax to account for the patient's symptoms. 3. Healing fracture of the anterolateral aspect of the right sixth rib. 4. Diffuse bronchial wall thickening with mild to moderate centrilobular and paraseptal emphysema; imaging findings suggestive of underlying COPD. 5. Aortic atherosclerosis, in addition to left main and 3 vessel coronary artery disease. 6. There are calcifications of the aortic valve. Echocardiographic correlation for evaluation of potential valvular dysfunction may be warranted if clinically indicated.   Aortic Atherosclerosis (ICD10-I70.0) and Emphysema (ICD10-J43.9).  History of Present Illness: Mr. Corcoran was initially evaluated by Dr. Acie Fredrickson in 1/22 for chronic calcifications incidentally noted on CT scan done during an admission for COPD exacerbation.  No further testing was recommended.  He returns for follow-up.  He is here with his wife.  He is excited about his 89th birthday coming up [birth year].  He feels his breathing is stable.  He has not had chest pain, syncope, orthopnea, leg edema.  He is on continuous O2.          Past Medical History:   Diagnosis Date   Acute duodenal ulcer with bleeding 01/30/2014   Acute post-hemorrhagic anemia 01/30/2014   Aortic atherosclerosis ([place]) 02/10/2020   Cancer ([place])    bladder   COPD exacerbation ([place]) 02/10/2020   Dyspnea on exertion 02/10/2020   Emphysema of lung ([place])    Essential hypertension 02/10/2020   GERD (gastroesophageal reflux disease) 02/10/2020   GI bleed 01/30/2014   Hyperlipidemia 02/10/2020    Current Medications: Current Meds  Medication Sig   albuterol (PROVENTIL HFA;VENTOLIN HFA) 108 (90 BASE) MCG/ACT inhaler Inhale 2 puffs into the lungs every 6 (six) hours as needed for wheezing or shortness of breath.   amLODipine (NORVASC) 5 MG tablet Take 5 mg by mouth daily with breakfast.   escitalopram (LEXAPRO) 10 MG tablet Take 10 mg by mouth daily.   loratadine (CLARITIN) 10 MG tablet Take 10 mg by mouth daily.   Multiple Vitamin (MULTIVITAMIN WITH MINERALS) TABS tablet Take 1 tablet by mouth daily with breakfast.   pantoprazole (PROTONIX) 40 MG tablet Take 1 tablet (40 mg total) by mouth 2 (two) times daily.   ramipril (ALTACE) 10 MG capsule Take 10 mg by mouth daily with breakfast.   rosuvastatin (CRESTOR) 20 MG tablet Take 20 mg by mouth at bedtime.   umeclidinium-vilanterol (ANORO ELLIPTA) 62.5-25 MCG/INH AEPB Inhale 1 puff into the lungs daily.     Allergies:   Patient has no known allergies.   Social History   Tobacco Use   Smoking status: Former    Years: 47.00    Types: Cigarettes  Start date: 61    Quit date: 35    Years since quitting: 28.6   Smokeless tobacco: Never  Vaping Use   Vaping Use: Never used  Substance Use Topics   Alcohol use: Not Currently   Drug use: Never     Family Hx: The patient's family history includes Stroke in his father.  ROS - See HPI  EKGs/Labs/Other Test Reviewed:    EKG:  EKG is not ordered today.  The ekg ordered today demonstrates n/a  Recent Labs: 02/10/2020: B Natriuretic Peptide 199.8 02/12/2020: BUN 40;  Creatinine, Ser 1.26; Hemoglobin 12.2; Platelets 275; Potassium 4.2; Sodium 139   Recent Lipid Panel Lab Results  Component Value Date/Time   CHOL 158 02/10/2020 05:17 PM   TRIG 64 02/10/2020 05:17 PM   HDL 65 02/10/2020 05:17 PM   LDLCALC 80 02/10/2020 05:17 PM   Labs obtained from PCP- personally reviewed and interpreted: 08/08/2020: Creatinine 1.3, K+ 4.8, calcium 9.1, albumin 3.9, ALT 13, Hgb 12.2, total cholesterol 175, triglycerides 67, HDL 67, LDL 95, TSH 1.66    Risk Assessment/Calculations:      Physical Exam:    VS:  BP (!) 112/52   Pulse 68   Ht '5\' 7"'$  (1.702 m)   Wt 138 lb 3.2 oz (62.7 kg)   SpO2 97%   BMI 21.65 kg/m     Wt Readings from Last 3 Encounters:  09/08/20 138 lb 3.2 oz (62.7 kg)  03/04/20 142 lb 12.8 oz (64.8 kg)  02/10/20 140 lb 10.5 oz (63.8 kg)     Constitutional:      Appearance: Healthy appearance. Not in distress.  Pulmonary:     Breath sounds: No wheezing. No rales.  Cardiovascular:     Normal rate. Regular rhythm. Normal S1. Normal S2.      Murmurs: There is no murmur.  Edema:    Peripheral edema absent.  Abdominal:     Palpations: Abdomen is soft.  Musculoskeletal:     Cervical back: Neck supple. Skin:    General: Skin is warm and dry.  Neurological:     General: No focal deficit present.     Mental Status: Alert and oriented to person, place and time.     Cranial Nerves: Cranial nerves are intact.     ASSESSMENT & PLAN:    1. Coronary artery calcification seen on CT scan Noted on prior CT scan.  He has normal EF. He is not having symptoms of angina.  Continue risk factor modification.  BP is well controlled.  His hyperlipidemia is managed by primary care.  Goal LDL < 70.  We discussed symptoms that may be due to ischemia.  Plan f/u in 1 year or sooner if needed.   2. Aortic atherosclerosis (HCC) Continue statin Rx.  He is not on ASA due to prior hx of duodenal ulcer.   3. Essential hypertension BP is controlled on current  regimen of Amlodipine and Ramipril.   4. Mixed hyperlipidemia Dr. Dagmar Hait recently changed Simvastatin to Rosuvastatin as LDL was > 70.       Dispo:  Return in about 1 year (around 09/08/2021) for Routine follow up in 1 year with Dr.Nahser. .   Medication Adjustments/Labs and Tests Ordered: Current medicines are reviewed at length with the patient today.  Concerns regarding medicines are outlined above.  Tests Ordered: No orders of the defined types were placed in this encounter.  Medication Changes: No orders of the defined types were placed in this encounter.  Signed, Richardson Dopp, PA-C  09/08/2020 2:22 PM    [facility] [address], [place], [place]  [address] Phone: [phone]; Fax: [phone]

## 2020-09-08 NOTE — Patient Instructions (Signed)
Medication Instructions:   Your physician recommends that you continue on your current medications as directed. Please refer to the Current Medication list given to you today.  *If you need a refill on your cardiac medications before your next appointment, please call your pharmacy*   Lab Work:  -None  If you have labs (blood work) drawn today and your tests are completely normal, you will receive your results only by: [place] (if you have MyChart) OR A paper copy in the mail If you have any lab test that is abnormal or we need to change your treatment, we will call you to review the results.   Testing/Procedures:  -None   Follow-Up: At [facility], you and your health needs are our priority.  As part of our continuing mission to provide you with exceptional heart care, we have created designated Provider Care Teams.  These Care Teams include your primary Cardiologist (physician) and Advanced Practice Providers (APPs -  Physician Assistants and Nurse Practitioners) who all work together to provide you with the care you need, when you need it.  We recommend signing up for the patient portal called "MyChart".  Sign up information is provided on this After Visit Summary.  MyChart is used to connect with patients for Virtual Visits (Telemedicine).  Patients are able to view lab/test results, encounter notes, upcoming appointments, etc.  Non-urgent messages can be sent to your provider as well.   To learn more about what you can do with MyChart, go to [email or web address].    Your next appointment:   1 year(s)  The format for your next appointment:   In Person  Provider:   Mertie Moores, MD   Other Instructions  Your physician wants you to follow-up in: 1 year with Dr. Acie Fredrickson.  You will receive a reminder letter in the mail two months in advance. If you don't receive a letter, please call our office to schedule the follow-up appointment.

## 2020-09-30 DIAGNOSIS — C44319 Basal cell carcinoma of skin of other parts of face: Secondary | ICD-10-CM | POA: Diagnosis not present

## 2020-09-30 DIAGNOSIS — Z85828 Personal history of other malignant neoplasm of skin: Secondary | ICD-10-CM | POA: Diagnosis not present

## 2020-09-30 DIAGNOSIS — L905 Scar conditions and fibrosis of skin: Secondary | ICD-10-CM | POA: Diagnosis not present

## 2020-09-30 DIAGNOSIS — D485 Neoplasm of uncertain behavior of skin: Secondary | ICD-10-CM | POA: Diagnosis not present

## 2020-10-29 DIAGNOSIS — H52203 Unspecified astigmatism, bilateral: Secondary | ICD-10-CM | POA: Diagnosis not present

## 2020-10-29 DIAGNOSIS — Z961 Presence of intraocular lens: Secondary | ICD-10-CM | POA: Diagnosis not present

## 2020-10-29 DIAGNOSIS — H35372 Puckering of macula, left eye: Secondary | ICD-10-CM | POA: Diagnosis not present

## 2020-11-03 DIAGNOSIS — C679 Malignant neoplasm of bladder, unspecified: Secondary | ICD-10-CM | POA: Diagnosis not present

## 2020-11-04 DIAGNOSIS — Z23 Encounter for immunization: Secondary | ICD-10-CM | POA: Diagnosis not present

## 2020-11-06 ENCOUNTER — Other Ambulatory Visit: Payer: Self-pay | Admitting: Urology

## 2020-11-06 ENCOUNTER — Telehealth: Payer: Self-pay | Admitting: Cardiovascular Disease

## 2020-11-06 NOTE — Telephone Encounter (Signed)
    Patient Name: Gerald Valdez  DOB: 1928/02/10 MRN: [id number]  Primary Cardiologist: Mertie Moores, MD  Chart reviewed as part of pre-operative protocol coverage. Given past medical history and time since last visit, based on ACC/AHA guidelines, Gerald Valdez would be at acceptable risk for the planned procedure without further cardiovascular testing.   The patient was advised that if he develops new symptoms prior to surgery to contact our office to arrange for a follow-up visit, and he verbalized understanding.  I will route this recommendation to the requesting party via Epic fax function and remove from pre-op pool.  Please call with questions.  [place], [place] 11/06/2020, 3:38 PM

## 2020-11-06 NOTE — Telephone Encounter (Signed)
   [facility] Pre-operative Risk Assessment    Patient Name: Gerald Valdez  DOB: 08-Feb-1929 MRN: [id number]  HEARTCARE STAFF:  - IMPORTANT!!!!!! Under Visit Info/Reason for Call, type in Other and utilize the format Clearance MM/DD/YY or Clearance TBD. Do not use dashes or single digits. - Please review there is not already an duplicate clearance open for this procedure. - If request is for dental extraction, please clarify the # of teeth to be extracted. - If the patient is currently at the dentist's office, call Pre-Op Callback Staff (MA/nurse) to input urgent request.  - If the patient is not currently in the dentist office, please route to the Pre-Op pool.  Request for surgical clearance:  What type of surgery is being performed? Bladder Tumor removed  When is this surgery scheduled? 11-20-20  What type of clearance is required (medical clearance vs. Pharmacy clearance to hold med vs. Both)? medical  Are there any medications that need to be held prior to surgery and how long? No  Practice name and name of physician performing surgery? Dr Annie Main Da  What is the office phone number? [phone]   7.   What is the office fax number? [phone]  8.   Anesthesia type (None, local, MAC, general) ? Spinal   Glyn Ade 11/06/2020, 2:00 PM  _________________________________________________________________   (provider comments below)

## 2020-11-07 ENCOUNTER — Other Ambulatory Visit: Payer: Self-pay

## 2020-11-07 ENCOUNTER — Encounter (HOSPITAL_COMMUNITY): Payer: Self-pay

## 2020-11-07 ENCOUNTER — Encounter (HOSPITAL_COMMUNITY)
Admission: RE | Admit: 2020-11-07 | Discharge: 2020-11-07 | Disposition: A | Discharge status: Home / Self Care | Payer: Medicare Other | Source: Ambulatory Visit | Attending: Anesthesiology | Admitting: Anesthesiology

## 2020-11-07 HISTORY — DX: Atherosclerotic heart disease of native coronary artery without angina pectoris: I25.10

## 2020-11-07 HISTORY — DX: Chronic kidney disease, unspecified: N18.9

## 2020-11-07 HISTORY — DX: Anxiety disorder, unspecified: F41.9

## 2020-11-07 NOTE — Progress Notes (Signed)
Please place orders in epic pt. Is scheduled for preop 

## 2020-11-07 NOTE — Patient Instructions (Addendum)
DUE TO COVID-19 ONLY ONE VISITOR IS ALLOWED TO COME WITH YOU AND STAY IN THE WAITING ROOM ONLY DURING PRE OP AND PROCEDURE DAY OF SURGERY.   TWO VISITOR  MAY VISIT WITH YOU AFTER SURGERY IN YOUR PRIVATE ROOM DURING VISITING HOURS ONLY!  YOU NEED TO HAVE A COVID 19 TEST ON__10-11-22____Between 8am-3pm______, THIS TEST MUST BE DONE BEFORE SURGERY,     Please bring completed form with you to the COVID testing site   COVID TESTING SITE [place] TEST IS COMPLETED,  PLEASE Wear a mask when in public           Your procedure is scheduled on: 11-20-20   Report to [facility] Main  Entrance   Report to admitting at    1000  AM     Call this number if you have problems the morning of surgery [phone]   Remember: Do not eat food :After Midnight.you may have clear liquids      until 0915 am then [place] Excluded water Black Coffee and tea, regular and decaf                                            liquids that you cannot  Plain Jell-O any favor except red or purple                                           see through such as: Fruit ices (not with fruit pulp)                                                                milk, soups, orange juice  Iced Popsicles                                                                  All solid food Carbonated beverages, regular and diet  Cranberry, grape and apple juices Sports drinks like Gatorade Lightly seasoned clear broth or consume(fat free) Sugar   _____________________________________________________________________     BRUSH YOUR TEETH MORNING OF SURGERY AND RINSE YOUR MOUTH OUT, NO CHEWING GUM CANDY OR MINTS.     Take these medicines the morning of surgery with A SIP OF  WATER: pantoprazole, loratadine, escitalopram (lexapro), amlodipine  DO NOT TAKE ANY DIABETIC MEDICATIONS DAY OF YOUR SURGERY                               You may not have any metal on your body including hair pins and              piercings  Do not wear jewelry, , lotions, powders,perfumes,  or    deodorant                        Men may shave face and neck.   Do not bring valuables to the hospital. [place].  Contacts, dentures or bridgework may not be worn into surgery.       Patients discharged the day of surgery will not be allowed to drive home. IF YOU ARE HAVING SURGERY AND GOING HOME THE SAME DAY, YOU MUST HAVE AN ADULT TO DRIVE YOU HOME AND BE WITH YOU FOR 24 HOURS. YOU MAY GO HOME BY TAXI OR UBER OR ORTHERWISE, BUT AN ADULT MUST ACCOMPANY YOU HOME AND STAY WITH YOU FOR 24 HOURS.  Name and phone number of your driver:  Special Instructions: N/A              Please read over the following fact sheets you were given: _____________________________________________________________________             [facility] - Preparing for Surgery Before surgery, you can play an important role.  Because skin is not sterile, your skin needs to be as free of germs as possible.  You can reduce the number of germs on your skin by washing with CHG (chlorahexidine gluconate) soap before surgery.  CHG is an antiseptic cleaner which kills germs and bonds with the skin to continue killing germs even after washing. Please DO NOT use if you have an allergy to CHG or antibacterial soaps.  If your skin becomes reddened/irritated stop using the CHG and inform your nurse when you arrive at Short Stay. Do not shave (including legs and underarms) for at least 48 hours prior to the first CHG shower.  You may shave your face/neck. Please follow these instructions carefully:  1.  Shower with CHG Soap the night before surgery and the  morning of Surgery.  2.  If  you choose to wash your hair, wash your hair first as usual with your  normal  shampoo.  3.  After you shampoo, rinse your hair and body thoroughly to remove the  shampoo.                           4.  Use CHG as you would any other liquid soap.  You can apply chg directly  to the skin and wash                       Gently with a scrungie  or clean washcloth.  5.  Apply the CHG Soap to your body ONLY FROM THE NECK DOWN.   Do not use on face/ open                           Wound or open sores. Avoid contact with eyes, ears mouth and genitals (private parts).                       Wash face,  Genitals (private parts) with your normal soap.             6.  Wash thoroughly, paying special attention to the area where your surgery  will be performed.  7.  Thoroughly rinse your body with warm water from the neck down.  8.  DO NOT shower/wash with your normal soap after using and rinsing off  the CHG Soap.                9.  Pat yourself dry with a clean towel.            10.  Wear clean pajamas.            11.  Place clean sheets on your bed the night of your first shower and do not  sleep with pets. Day of Surgery : Do not apply any lotions/deodorants the morning of surgery.  Please wear clean clothes to the hospital/surgery center.  FAILURE TO FOLLOW THESE INSTRUCTIONS MAY RESULT IN THE CANCELLATION OF YOUR SURGERY PATIENT SIGNATURE_________________________________  NURSE SIGNATURE__________________________________  ________________________________________________________________________

## 2020-11-07 NOTE — Progress Notes (Addendum)
PCP - Dr.  Prince Solian  Cardiologist - Clearance 11-06-20 Cleatrice Burke, MD LOV note 09-08-20 epic   PPM/ICD -  Device Orders -  Rep Notified -   Chest x-ray - CXR/CTA chest 02-10-20 epic EKG - 02-12-20 epic Stress Test -  ECHO - 02-11-20 epic Cardiac Cath -   Sleep Study -  CPAP -   Fasting Blood Sugar -  Checks Blood Sugar _____ times a day  Blood Thinner Instructions: Aspirin Instructions:  ERAS Protcol - PRE-SURGERY Ensure or G2-   COVID TEST- 11-18-20 COVID vaccine - Fully vaccinated and boosted  Activity-- On O2  for COPD can be off o2 maybe an hour and then has to resume. Able to walk in home with o2 without SOB  Anesthesia review: 3L continuous O2 , COPD, HTN, CKD  Patient denies shortness of breath, fever, cough and chest pain at PAT appointment   All instructions explained to the patient, with a verbal understanding of the material. Patient agrees to go over the instructions while at home for a better understanding. Patient also instructed to self quarantine after being tested for COVID-19. The opportunity to ask questions was provided.

## 2020-11-10 ENCOUNTER — Encounter (HOSPITAL_COMMUNITY)
Admission: RE | Admit: 2020-11-10 | Discharge: 2020-11-10 | Disposition: A | Discharge status: Home / Self Care | Payer: Medicare Other | Source: Ambulatory Visit | Attending: Urology | Admitting: Urology

## 2020-11-10 ENCOUNTER — Other Ambulatory Visit: Payer: Self-pay

## 2020-11-10 DIAGNOSIS — C679 Malignant neoplasm of bladder, unspecified: Secondary | ICD-10-CM | POA: Diagnosis not present

## 2020-11-10 DIAGNOSIS — I2510 Atherosclerotic heart disease of native coronary artery without angina pectoris: Secondary | ICD-10-CM | POA: Diagnosis not present

## 2020-11-10 DIAGNOSIS — J449 Chronic obstructive pulmonary disease, unspecified: Secondary | ICD-10-CM | POA: Insufficient documentation

## 2020-11-10 DIAGNOSIS — Z87891 Personal history of nicotine dependence: Secondary | ICD-10-CM | POA: Diagnosis not present

## 2020-11-10 DIAGNOSIS — N189 Chronic kidney disease, unspecified: Secondary | ICD-10-CM | POA: Insufficient documentation

## 2020-11-10 DIAGNOSIS — I129 Hypertensive chronic kidney disease with stage 1 through stage 4 chronic kidney disease, or unspecified chronic kidney disease: Secondary | ICD-10-CM | POA: Diagnosis not present

## 2020-11-10 DIAGNOSIS — Z01812 Encounter for preprocedural laboratory examination: Secondary | ICD-10-CM | POA: Diagnosis not present

## 2020-11-10 DIAGNOSIS — Z9981 Dependence on supplemental oxygen: Secondary | ICD-10-CM | POA: Diagnosis not present

## 2020-11-10 DIAGNOSIS — Z79899 Other long term (current) drug therapy: Secondary | ICD-10-CM | POA: Diagnosis not present

## 2020-11-10 LAB — CBC
HCT: 37.1 % — ABNORMAL LOW (ref 39.0–52.0)
Hemoglobin: 11.3 g/dL — ABNORMAL LOW (ref 13.0–17.0)
MCH: 29.8 pg (ref 26.0–34.0)
MCHC: 30.5 g/dL (ref 30.0–36.0)
MCV: 97.9 fL (ref 80.0–100.0)
Platelets: 213 10*3/uL (ref 150–400)
RBC: 3.79 MIL/uL — ABNORMAL LOW (ref 4.22–5.81)
RDW: 13.7 % (ref 11.5–15.5)
WBC: 8.6 10*3/uL (ref 4.0–10.5)
nRBC: 0 % (ref 0.0–0.2)

## 2020-11-10 LAB — BASIC METABOLIC PANEL
Anion gap: 5 (ref 5–15)
BUN: 32 mg/dL — ABNORMAL HIGH (ref 8–23)
CO2: 32 mmol/L (ref 22–32)
Calcium: 9.2 mg/dL (ref 8.9–10.3)
Chloride: 105 mmol/L (ref 98–111)
Creatinine, Ser: 1.55 mg/dL — ABNORMAL HIGH (ref 0.61–1.24)
GFR, Estimated: 42 mL/min — ABNORMAL LOW (ref >60)
Glucose, Bld: 111 mg/dL — ABNORMAL HIGH (ref 70–99)
Potassium: 4.4 mmol/L (ref 3.5–5.1)
Sodium: 142 mmol/L (ref 135–145)

## 2020-11-11 DIAGNOSIS — C44319 Basal cell carcinoma of skin of other parts of face: Secondary | ICD-10-CM | POA: Diagnosis not present

## 2020-11-11 DIAGNOSIS — D492 Neoplasm of unspecified behavior of bone, soft tissue, and skin: Secondary | ICD-10-CM | POA: Diagnosis not present

## 2020-11-11 NOTE — Progress Notes (Signed)
Anesthesia Chart Review   Case: [id number] Date/Time: 11/20/20 1200   Procedures:      TRANSURETHRAL RESECTION OF BLADDER TUMOR WITH GEMCITABINE     CYSTOSCOPY WITH RETROGRADE PYELOGRAM (Bilateral)   Anesthesia type: Spinal   Pre-op diagnosis: BLADDER CANCER   Location: [facility] [facility]   Surgeons: Franchot Gallo, MD       DISCUSSION:87 y.o. former smoker with h/o GERD, HTN, COPD (on 3L O2 continuous), CKD, CAD, bladder cancer scheduled for above procedure 11/20/2020 with Dr. Franchot Gallo.   Per cardiology preoperative evaluation 11/06/2020, "Chart reviewed as part of pre-operative protocol coverage. Given past medical history and time since last visit, based on ACC/AHA guidelines, Gerald Valdez would be at acceptable risk for the planned procedure without further cardiovascular testing."  Last seen by PCP 08/15/2020.   Anticipate pt can proceed with planned procedure barring acute status change.   VS: BP 135/66   Pulse 73   Temp 36.8 C (Oral)   Resp 16   Ht 5' 6.5" (1.689 m)   Wt 62.1 kg   SpO2 100%   BMI 21.78 kg/m   PROVIDERS: Prince Solian, MD is PCP   Mertie Moores, MD is Cardiologist  LABS: Labs reviewed: Acceptable for surgery. (all labs ordered are listed, but only abnormal results are displayed)  Labs Reviewed  CBC - Abnormal; Notable for the following components:      Result Value   RBC 3.79 (*)    Hemoglobin 11.3 (*)    HCT 37.1 (*)    All other components within normal limits  BASIC METABOLIC PANEL - Abnormal; Notable for the following components:   Glucose, Bld 111 (*)    BUN 32 (*)    Creatinine, Ser 1.55 (*)    GFR, Estimated 42 (*)    All other components within normal limits     IMAGES:   EKG:   CV: Echo 02/11/20  1. Left ventricular ejection fraction, by estimation, is 55 to 60%. The  left ventricle has normal function. The left ventricle has no regional  wall motion abnormalities. Left ventricular diastolic  parameters are  consistent with Grade I diastolic  dysfunction (impaired relaxation).   2. Right ventricular systolic function is normal. The right ventricular  size is normal.   3. The mitral valve is normal in structure. No evidence of mitral valve  regurgitation. No evidence of mitral stenosis.   4. There is a 1 cm x 0.8 cm calcified, mobile density in the LVOT between  the aortic valve and anterior mitral valve leaflet. Consider TEE to better  evaluate. The aortic valve is calcified. There is moderate calcification  of the aortic valve. There is  moderate thickening of the aortic valve. Aortic valve regurgitation is not  visualized. No aortic stenosis is present.   5. The inferior vena cava is normal in size with greater than 50%  respiratory variability, suggesting right atrial pressure of 3 mmHg. Past Medical History:  Diagnosis Date   Acute duodenal ulcer with bleeding 01/30/2014   Acute post-hemorrhagic anemia 01/30/2014   Anxiety    Aortic atherosclerosis ([place]) 02/10/2020   Cancer ([place])    bladder   Chronic kidney disease    COPD exacerbation ([place]) 02/10/2020   Coronary artery disease    Dyspnea on exertion 02/10/2020   Emphysema of lung ([place])    Essential hypertension 02/10/2020   GERD (gastroesophageal reflux disease) 02/10/2020   GI bleed 01/30/2014   Hyperlipidemia 02/10/2020  Past Surgical History:  Procedure Laterality Date   BLADDER SURGERY     ESOPHAGOGASTRODUODENOSCOPY N/A 01/30/2014   Procedure: ESOPHAGOGASTRODUODENOSCOPY (EGD);  Surgeon: Inda Castle, MD;  Location: [name];  Service: Endoscopy;  Laterality: N/A;   TONSILLECTOMY      MEDICATIONS:  amLODipine (NORVASC) 5 MG tablet   escitalopram (LEXAPRO) 10 MG tablet   loratadine (CLARITIN) 10 MG tablet   Multiple Vitamin (MULTIVITAMIN WITH MINERALS) TABS tablet   OXYGEN   pantoprazole (PROTONIX) 40 MG tablet   ramipril (ALTACE) 10 MG capsule   rosuvastatin (CRESTOR) 20 MG tablet   No  current facility-administered medications for this encounter.    Gerald Felix Ward, PA-C [facility] Pre-Surgical Testing [phone]

## 2020-11-17 MED ORDER — GEMCITABINE CHEMO FOR BLADDER INSTILLATION 2000 MG: 2000.0000 mg | Freq: Once | INTRAVENOUS | Status: AC

## 2020-11-18 ENCOUNTER — Other Ambulatory Visit: Payer: Self-pay | Admitting: Urology

## 2020-11-18 LAB — SARS CORONAVIRUS 2 (TAT 6-24 HRS): SARS Coronavirus 2: NEGATIVE

## 2020-11-20 ENCOUNTER — Encounter (HOSPITAL_COMMUNITY)
Admission: RE | Disposition: A | Discharge status: Home / Self Care | Payer: Self-pay | Source: Ambulatory Visit | Attending: Urology

## 2020-11-20 ENCOUNTER — Ambulatory Visit (HOSPITAL_COMMUNITY): Payer: Medicare Other | Admitting: Certified Registered Nurse Anesthetist

## 2020-11-20 ENCOUNTER — Other Ambulatory Visit: Payer: Self-pay

## 2020-11-20 ENCOUNTER — Encounter (HOSPITAL_COMMUNITY): Payer: Self-pay | Admitting: Urology

## 2020-11-20 ENCOUNTER — Ambulatory Visit (HOSPITAL_COMMUNITY): Payer: Medicare Other | Admitting: Physician Assistant

## 2020-11-20 ENCOUNTER — Observation Stay (HOSPITAL_COMMUNITY)
Admission: RE | Admit: 2020-11-20 | Discharge: 2020-11-21 | Disposition: A | Discharge status: Home / Self Care | Payer: Medicare Other | Source: Ambulatory Visit | Attending: Urology | Admitting: Urology

## 2020-11-20 ENCOUNTER — Ambulatory Visit (HOSPITAL_COMMUNITY): Payer: Medicare Other

## 2020-11-20 DIAGNOSIS — J449 Chronic obstructive pulmonary disease, unspecified: Secondary | ICD-10-CM | POA: Insufficient documentation

## 2020-11-20 DIAGNOSIS — N189 Chronic kidney disease, unspecified: Secondary | ICD-10-CM | POA: Diagnosis not present

## 2020-11-20 DIAGNOSIS — I129 Hypertensive chronic kidney disease with stage 1 through stage 4 chronic kidney disease, or unspecified chronic kidney disease: Secondary | ICD-10-CM | POA: Diagnosis not present

## 2020-11-20 DIAGNOSIS — I2510 Atherosclerotic heart disease of native coronary artery without angina pectoris: Secondary | ICD-10-CM | POA: Diagnosis not present

## 2020-11-20 DIAGNOSIS — Z87891 Personal history of nicotine dependence: Secondary | ICD-10-CM | POA: Insufficient documentation

## 2020-11-20 DIAGNOSIS — N400 Enlarged prostate without lower urinary tract symptoms: Secondary | ICD-10-CM | POA: Diagnosis not present

## 2020-11-20 DIAGNOSIS — C678 Malignant neoplasm of overlapping sites of bladder: Principal | ICD-10-CM | POA: Diagnosis present

## 2020-11-20 DIAGNOSIS — K219 Gastro-esophageal reflux disease without esophagitis: Secondary | ICD-10-CM | POA: Diagnosis not present

## 2020-11-20 DIAGNOSIS — E785 Hyperlipidemia, unspecified: Secondary | ICD-10-CM | POA: Diagnosis not present

## 2020-11-20 DIAGNOSIS — C679 Malignant neoplasm of bladder, unspecified: Secondary | ICD-10-CM | POA: Diagnosis not present

## 2020-11-20 HISTORY — DX: Dyspnea, unspecified: R06.00

## 2020-11-20 HISTORY — PX: TRANSURETHRAL RESECTION OF BLADDER TUMOR WITH MITOMYCIN-C: SHX6459

## 2020-11-20 HISTORY — PX: CYSTOSCOPY W/ RETROGRADES: SHX1426

## 2020-11-20 SURGERY — TRANSURETHRAL RESECTION OF BLADDER TUMOR WITH MITOMYCIN-C
Anesthesia: General | Site: Ureter

## 2020-11-20 MED ORDER — EPHEDRINE SULFATE-NACL 50-0.9 MG/10ML-% IV SOSY
PREFILLED_SYRINGE | INTRAVENOUS | Status: DC | PRN
  Administered 2020-11-20: 5 mg via INTRAVENOUS

## 2020-11-20 MED ORDER — FENTANYL CITRATE PF 50 MCG/ML IJ SOSY
25.0000 ug | PREFILLED_SYRINGE | INTRAMUSCULAR | Status: DC | PRN
  Administered 2020-11-20: 25 ug via INTRAVENOUS

## 2020-11-20 MED ORDER — CHLORHEXIDINE GLUCONATE 0.12 % MT SOLN
15.0000 mL | Freq: Once | OROMUCOSAL | Status: AC
  Administered 2020-11-20: 15 mL via OROMUCOSAL

## 2020-11-20 MED ORDER — DEXAMETHASONE SODIUM PHOSPHATE 10 MG/ML IJ SOLN
INTRAMUSCULAR | Status: AC
  Filled 2020-11-20: qty 1

## 2020-11-20 MED ORDER — CEFAZOLIN SODIUM-DEXTROSE 2-4 GM/100ML-% IV SOLN
2.0000 g | INTRAVENOUS | Status: AC
  Administered 2020-11-20: 2 g via INTRAVENOUS
  Filled 2020-11-20: qty 100

## 2020-11-20 MED ORDER — ONDANSETRON HCL 4 MG/2ML IJ SOLN
INTRAMUSCULAR | Status: AC
  Filled 2020-11-20: qty 2

## 2020-11-20 MED ORDER — PROMETHAZINE HCL 25 MG/ML IJ SOLN: 6.2500 mg | INTRAMUSCULAR | Status: DC | PRN

## 2020-11-20 MED ORDER — SODIUM CHLORIDE 0.45 % IV SOLN: INTRAVENOUS | Status: DC

## 2020-11-20 MED ORDER — PANTOPRAZOLE SODIUM 40 MG PO TBEC
40.0000 mg | DELAYED_RELEASE_TABLET | Freq: Every day | ORAL | Status: DC
  Administered 2020-11-20 – 2020-11-21 (×2): 40 mg via ORAL
  Filled 2020-11-20 (×2): qty 1

## 2020-11-20 MED ORDER — FENTANYL CITRATE (PF) 100 MCG/2ML IJ SOLN
INTRAMUSCULAR | Status: AC
  Filled 2020-11-20: qty 2

## 2020-11-20 MED ORDER — PHENYLEPHRINE 40 MCG/ML (10ML) SYRINGE FOR IV PUSH (FOR BLOOD PRESSURE SUPPORT)
PREFILLED_SYRINGE | INTRAVENOUS | Status: AC
  Filled 2020-11-20: qty 20

## 2020-11-20 MED ORDER — LIDOCAINE 2% (20 MG/ML) 5 ML SYRINGE
INTRAMUSCULAR | Status: DC | PRN
  Administered 2020-11-20: 60 mg via INTRAVENOUS

## 2020-11-20 MED ORDER — LORATADINE 10 MG PO TABS
10.0000 mg | ORAL_TABLET | Freq: Every day | ORAL | Status: DC
  Administered 2020-11-20 – 2020-11-21 (×2): 10 mg via ORAL
  Filled 2020-11-20 (×2): qty 1

## 2020-11-20 MED ORDER — ESCITALOPRAM OXALATE 10 MG PO TABS
10.0000 mg | ORAL_TABLET | Freq: Every day | ORAL | Status: DC
  Administered 2020-11-20 – 2020-11-21 (×2): 10 mg via ORAL
  Filled 2020-11-20 (×2): qty 1

## 2020-11-20 MED ORDER — IOPAMIDOL (ISOVUE-300) INJECTION 61%
INTRAVENOUS | Status: DC | PRN
  Administered 2020-11-20: 5 mL via URETHRAL

## 2020-11-20 MED ORDER — PROPOFOL 10 MG/ML IV BOLUS
INTRAVENOUS | Status: AC
  Filled 2020-11-20: qty 20

## 2020-11-20 MED ORDER — ACETAMINOPHEN 325 MG PO TABS: 650.0000 mg | ORAL_TABLET | ORAL | Status: DC | PRN

## 2020-11-20 MED ORDER — RAMIPRIL 10 MG PO CAPS: 10.0000 mg | ORAL_CAPSULE | Freq: Every day | ORAL | Status: DC

## 2020-11-20 MED ORDER — ONDANSETRON HCL 4 MG/2ML IJ SOLN
INTRAMUSCULAR | Status: DC | PRN
  Administered 2020-11-20: 4 mg via INTRAVENOUS

## 2020-11-20 MED ORDER — SODIUM CHLORIDE 0.9 % IR SOLN
Status: DC | PRN
  Administered 2020-11-20: 21000 mL

## 2020-11-20 MED ORDER — CEPHALEXIN 500 MG PO CAPS
500.0000 mg | ORAL_CAPSULE | Freq: Two times a day (BID) | ORAL | Status: DC
  Administered 2020-11-20 – 2020-11-21 (×2): 500 mg via ORAL
  Filled 2020-11-20 (×2): qty 1

## 2020-11-20 MED ORDER — PROPOFOL 10 MG/ML IV BOLUS
INTRAVENOUS | Status: DC | PRN
  Administered 2020-11-20: 20 mg via INTRAVENOUS
  Administered 2020-11-20: 10 mg via INTRAVENOUS
  Administered 2020-11-20: 50 mg via INTRAVENOUS
  Administered 2020-11-20: 30 mg via INTRAVENOUS
  Administered 2020-11-20: 20 mg via INTRAVENOUS
  Administered 2020-11-20 (×2): 10 mg via INTRAVENOUS

## 2020-11-20 MED ORDER — LACTATED RINGERS IV SOLN: INTRAVENOUS | Status: DC

## 2020-11-20 MED ORDER — ROSUVASTATIN CALCIUM 20 MG PO TABS
20.0000 mg | ORAL_TABLET | Freq: Every day | ORAL | Status: DC
  Administered 2020-11-20: 20 mg via ORAL
  Filled 2020-11-20: qty 1

## 2020-11-20 MED ORDER — ADULT MULTIVITAMIN W/MINERALS CH
1.0000 | ORAL_TABLET | Freq: Every day | ORAL | Status: DC
  Administered 2020-11-21: 1 via ORAL
  Filled 2020-11-20: qty 1

## 2020-11-20 MED ORDER — EPHEDRINE 5 MG/ML INJ
INTRAVENOUS | Status: AC
  Filled 2020-11-20: qty 5

## 2020-11-20 MED ORDER — AMLODIPINE BESYLATE 5 MG PO TABS
5.0000 mg | ORAL_TABLET | Freq: Every day | ORAL | Status: DC
  Administered 2020-11-21: 5 mg via ORAL
  Filled 2020-11-20: qty 1

## 2020-11-20 MED ORDER — ORAL CARE MOUTH RINSE: 15.0000 mL | Freq: Once | OROMUCOSAL | Status: AC

## 2020-11-20 MED ORDER — HYDROCODONE-ACETAMINOPHEN 5-325 MG PO TABS: 1.0000 | ORAL_TABLET | ORAL | Status: DC | PRN

## 2020-11-20 MED ORDER — DEXAMETHASONE SODIUM PHOSPHATE 10 MG/ML IJ SOLN
INTRAMUSCULAR | Status: DC | PRN
  Administered 2020-11-20: 4 mg via INTRAVENOUS

## 2020-11-20 MED ORDER — FENTANYL CITRATE (PF) 100 MCG/2ML IJ SOLN
INTRAMUSCULAR | Status: DC | PRN
  Administered 2020-11-20 (×4): 25 ug via INTRAVENOUS

## 2020-11-20 MED ORDER — BELLADONNA ALKALOIDS-OPIUM 16.2-60 MG RE SUPP: 1.0000 | Freq: Four times a day (QID) | RECTAL | Status: DC | PRN

## 2020-11-20 MED ORDER — SENNA 8.6 MG PO TABS
1.0000 | ORAL_TABLET | Freq: Two times a day (BID) | ORAL | Status: DC
  Administered 2020-11-20 – 2020-11-21 (×2): 8.6 mg via ORAL
  Filled 2020-11-20 (×2): qty 1

## 2020-11-20 MED ORDER — PHENYLEPHRINE 40 MCG/ML (10ML) SYRINGE FOR IV PUSH (FOR BLOOD PRESSURE SUPPORT)
PREFILLED_SYRINGE | INTRAVENOUS | Status: DC | PRN
  Administered 2020-11-20: 120 ug via INTRAVENOUS
  Administered 2020-11-20: 80 ug via INTRAVENOUS
  Administered 2020-11-20: 120 ug via INTRAVENOUS
  Administered 2020-11-20: 80 ug via INTRAVENOUS

## 2020-11-20 MED ORDER — FENTANYL CITRATE PF 50 MCG/ML IJ SOSY
PREFILLED_SYRINGE | INTRAMUSCULAR | Status: AC
  Filled 2020-11-20: qty 1

## 2020-11-20 MED ORDER — ACETAMINOPHEN 500 MG PO TABS
1000.0000 mg | ORAL_TABLET | Freq: Once | ORAL | Status: AC
  Administered 2020-11-20: 1000 mg via ORAL
  Filled 2020-11-20: qty 2

## 2020-11-20 SURGICAL SUPPLY — 30 items
BAG COUNTER SPONGE SURGICOUNT (BAG) IMPLANT
BAG DRN RND TRDRP ANRFLXCHMBR (UROLOGICAL SUPPLIES) ×2
BAG SPNG CNTER NS LX DISP (BAG)
BAG URINE DRAIN 2000ML AR STRL (UROLOGICAL SUPPLIES) ×1 IMPLANT
BAG URO CATCHER STRL LF (MISCELLANEOUS) ×3 IMPLANT
CATH FOLEY 2WAY SLVR  5CC 20FR (CATHETERS) ×3
CATH FOLEY 2WAY SLVR 30CC 24FR (CATHETERS) IMPLANT
CATH FOLEY 2WAY SLVR 5CC 20FR (CATHETERS) IMPLANT
CATH INTERMIT  6FR 70CM (CATHETERS) ×3 IMPLANT
CLOTH BEACON ORANGE TIMEOUT ST (SAFETY) ×3 IMPLANT
DRAPE FOOT SWITCH (DRAPES) ×3 IMPLANT
ELECT REM PT RETURN 15FT ADLT (MISCELLANEOUS) ×2 IMPLANT
EVACUATOR MICROVAS BLADDER (UROLOGICAL SUPPLIES) IMPLANT
GLOVE SURG ENC TEXT LTX SZ8 (GLOVE) ×3 IMPLANT
GOWN STRL REUS W/ TWL XL LVL3 (GOWN DISPOSABLE) ×2 IMPLANT
GOWN STRL REUS W/TWL XL LVL3 (GOWN DISPOSABLE) ×6 IMPLANT
GUIDEWIRE STR DUAL SENSOR (WIRE) ×3 IMPLANT
KIT TURNOVER KIT A (KITS) ×3 IMPLANT
LOOP CUT BIPOLAR 24F LRG (ELECTROSURGICAL) ×1 IMPLANT
LOOP MONOPOLAR YLW (ELECTROSURGICAL) IMPLANT
MANIFOLD NEPTUNE II (INSTRUMENTS) ×3 IMPLANT
NDL SAFETY ECLIPSE 18X1.5 (NEEDLE) ×2 IMPLANT
NEEDLE HYPO 18GX1.5 SHARP (NEEDLE) ×3
PACK CYSTO (CUSTOM PROCEDURE TRAY) ×3 IMPLANT
PENCIL SMOKE EVACUATOR (MISCELLANEOUS) IMPLANT
SYR TOOMEY IRRIG 70ML (MISCELLANEOUS) ×3
SYRINGE TOOMEY IRRIG 70ML (MISCELLANEOUS) IMPLANT
TUBING CONNECTING 10 (TUBING) ×3 IMPLANT
TUBING UROLOGY SET (TUBING) ×3 IMPLANT
WATER STERILE IRR 3000ML UROMA (IV SOLUTION) ×3 IMPLANT

## 2020-11-20 NOTE — H&P (Signed)
H&P  Chief Complaint: Bladder cancer  History of Present Illness: 87 yo male presents for TURBT/gemcitabine for mgmt of recurrent TCCa of bladder. He was initially treated in 1993 and until recent visit where recurrence was found, was last seen in 2008.  Past Medical History:  Diagnosis Date   Acute duodenal ulcer with bleeding 01/30/2014   Acute post-hemorrhagic anemia 01/30/2014   Anxiety    Aortic atherosclerosis ([place]) 02/10/2020   Cancer ([place])    bladder   Chronic kidney disease    COPD exacerbation ([place]) 02/10/2020   Coronary artery disease    Dyspnea on exertion 02/10/2020   Emphysema of lung ([place])    Essential hypertension 02/10/2020   GERD (gastroesophageal reflux disease) 02/10/2020   GI bleed 01/30/2014   Hyperlipidemia 02/10/2020    Past Surgical History:  Procedure Laterality Date   BLADDER SURGERY     ESOPHAGOGASTRODUODENOSCOPY N/A 01/30/2014   Procedure: ESOPHAGOGASTRODUODENOSCOPY (EGD);  Surgeon: Inda Castle, MD;  Location: [name];  Service: Endoscopy;  Laterality: N/A;   TONSILLECTOMY      Home Medications:  Allergies as of 11/20/2020   No Known Allergies      Medication List      Notice   Cannot display discharge medications because the patient has not yet been admitted.     Allergies: No Known Allergies  Family History  Problem Relation Age of Onset   Stroke Father     Social History:  reports that he quit smoking about 28 years ago. His smoking use included cigarettes. He started smoking about 75 years ago. He has never used smokeless tobacco. He reports that he does not currently use alcohol. He reports that he does not use drugs.  ROS: A complete review of systems was performed.  All systems are negative except for pertinent findings as noted.  Physical Exam:  Vital signs in last 24 hours: There were no vitals taken for this visit. Constitutional:  Alert and oriented, No acute distress Cardiovascular: Regular rate   Respiratory: Normal respiratory effort GI: Abdomen is soft, nontender, nondistended, no abdominal masses. No CVAT.  Genitourinary: Normal male phallus, testes are descended bilaterally and non-tender and without masses, scrotum is normal in appearance without lesions or masses, perineum is normal on inspection. Lymphatic: No lymphadenopathy Neurologic: Grossly intact, no focal deficits Psychiatric: Normal mood and affect    Impression/Assessment:  Bladder cancer  Plan:  TURBT, possible placement of gemcitabine

## 2020-11-20 NOTE — Op Note (Signed)
Preoperative diagnosis: Recurrent urothelial carcinoma the bladder  Postoperative diagnosis: Same  Principal procedure: Cystoscopy, attempted right retrograde pyelogram, left retrograde pyelogram, intraoperative fluoroscopy, TURBT of large (approximate 6 cm) bladder tumor  Surgeon: Christel Bai  Anesthesia: General with LMA  Complications: None  Specimen: Bladder tumor, for permanent specimen  Estimated blood loss: Less than 30 mL  Drains: 20 French Foley catheter  Indications: 87 year old male with remote history of urothelial carcinoma the bladder.  Initial resection in 1993.  He received BCG induction and maintenance.  Last cystoscopy in our office was in 2008, by Dr. Sharen Heck.  He recently presented with persistent microscopic hematuria noted by his primary care provider.  Cystoscopy in the office revealed a fairly large trigonal tumor, papillary and nodular in nature with calcifications.  He presents at this time for resection as well as bilateral retrograde pyelograms.  Findings: Urethra was without stricture or lesion.  Prostate minimally obstructive, no lesions within the prostatic urethra.  Bladder inspected circumferentially.  Left ureteral orifice was identified, the right never could be identified.  There was a large pedunculated lesion, part papillary, nodular and there were associated calcifications, most likely dystrophic.  This pedunculated lesion was based in the right trigonal area, the office obscuring the right ureteral orifice.  Retrograde study of the left ureter and pyelocalyceal system revealed a normal ureter without filling defects or strictures.  Pyelocalyceal system was normal without filling defects.  Description of procedure: The patient was properly identified in the holding area.  Is taken to the operating room where general anesthetic was administered with the LMA.  He was placed in the dorsolithotomy position.  Genitalia and perineum were prepped, draped,  proper timeout performed.  21 French panendoscope advanced into the bladder with the above-mentioned findings noted.  I searched for the right ureteral orifice but never could find this, and was unable to perform retrograde study on that side.  The left ureteral orifice was easily identified, and using a 6 [place] open-ended catheter retrograde study was performed using Omnipaque with normal findings.  At this point the cystoscope was removed and a 26 French resectoscope sheath was placed using the visual obturator.  The resectoscope and cutting loop were then placed.  I determined that the tumor was based in the left trigonal area, with the base of the tumor being approximately 2 cm in size.  The pedunculated tumor was quite large, however, obscuring the posterior bladder.  I estimated its size at approximately 6 cm.  I used the cutting loop to tease the base of the tumor off of the bladder wall.  Once this was done, the tumor base was cauterized.  The bladder was quite thin in this area.  I did see a small amount of perivesical fat.  Once this was accomplished, however the tumor was free-floating in the bladder.  It was quite solid, and I ended up having to resect/morcellate the tumor with the cutting current.  Once this was done adequately, I was eventually able to irrigate all of the bladder tumor fragments from the bladder with a [place] syringe.  These were all sent for permanent specimen.  Inspection was then performed of the bladder.  The base of the bladder was hemostatic.  I never did see that right ureteral orifice even after the resection.  There were no tumor fragments remaining.  There was no obvious bleeding.  At this point the procedure was terminated.  Scope was removed, 20 [place] Foley catheter placed in 10 cc of  water was used to fill the balloon.  Catheter was then placed to dependent drainage.  This point the procedure was terminated.  The patient was awakened, extubated, and taken to the  PACU in stable condition having tolerated procedure well.

## 2020-11-20 NOTE — Transfer of Care (Signed)
Immediate Anesthesia Transfer of Care Note  Patient: Gerald Valdez  Procedure(s) Performed: TRANSURETHRAL RESECTION OF BLADDER TUMOR (Bladder) CYSTOSCOPY WITH RETROGRADE PYELOGRAM (Bilateral: Ureter)  Patient Location: PACU  Anesthesia Type:General  Level of Consciousness: drowsy  Airway & Oxygen Therapy: Patient Spontanous Breathing and Patient connected to nasal cannula oxygen  Post-op Assessment: Report given to RN and Post -op Vital signs reviewed and stable  Post vital signs: Reviewed and stable  Last Vitals:  Vitals Value Taken Time  BP 122/69 11/20/20 1445  Temp    Pulse 98 11/20/20 1447  Resp 24 11/20/20 1448  SpO2 100 % 11/20/20 1447  Vitals shown include unvalidated device data.  Last Pain:  Vitals:   11/20/20 1118  TempSrc: Oral  PainSc: 0-No pain         Complications: No notable events documented.

## 2020-11-20 NOTE — Anesthesia Preprocedure Evaluation (Addendum)
Anesthesia Evaluation  Patient identified by MRN, date of birth, ID band Patient awake    Reviewed: Allergy & Precautions, NPO status , Patient's Chart, lab work & pertinent test results  History of Anesthesia Complications Negative for: history of anesthetic complications  Airway Mallampati: II  TM Distance: >3 FB Neck ROM: Full    Dental  (+) Edentulous Upper, Poor Dentition, Dental Advisory Given   Pulmonary COPD, former smoker,    Pulmonary exam normal        Cardiovascular hypertension, Pt. on medications Normal cardiovascular exam  Per cardiology preoperative evaluation 11/06/2020, "Chart reviewed as part of pre-operative protocol coverage. Given past medical history and time since last visit, based on ACC/AHA guidelines, DANYON MCGINNESS would be at acceptable risk for the planned procedure without further cardiovascular testing."  IMPRESSIONS   1. Left ventricular ejection fraction, by estimation, is 55 to 60%. The left ventricle has normal function. The left ventricle has no regional wall motion abnormalities. Left ventricular diastolic parameters are consistent with Grade I diastolic  dysfunction (impaired relaxation). 2. Right ventricular systolic function is normal. The right ventricular size is normal. 3. The mitral valve is normal in structure. No evidence of mitral valve regurgitation. No evidence of mitral stenosis. 4. There is a 1 cm x 0.8 cm calcified, mobile density in the LVOT between the aortic valve and anterior mitral valve leaflet. Consider TEE to better evaluate. The aortic valve is calcified. There is moderate calcification of the aortic valve. There is  moderate thickening of the aortic valve. Aortic valve regurgitation is not visualized. No aortic stenosis is present. 5. The inferior vena cava is normal in size with greater than 50% respiratory variability, suggesting right atrial pressure of 3  mmHg.    Neuro/Psych PSYCHIATRIC DISORDERS Anxiety negative neurological ROS     GI/Hepatic Neg liver ROS, GERD  ,  Endo/Other  negative endocrine ROS  Renal/GU      Musculoskeletal negative musculoskeletal ROS (+)   Abdominal   Peds  Hematology negative hematology ROS (+)   Anesthesia Other Findings   Reproductive/Obstetrics                           Anesthesia Physical Anesthesia Plan  ASA: 3  Anesthesia Plan: General   Post-op Pain Management:    Induction: Intravenous  PONV Risk Score and Plan: 4 or greater and Ondansetron, Propofol infusion and Treatment may vary due to age or medical condition  Airway Management Planned:   Additional Equipment:   Intra-op Plan:   Post-operative Plan: Extubation in OR  Informed Consent:   Plan Discussed with: Anesthesiologist  Anesthesia Plan Comments:         Anesthesia Quick Evaluation

## 2020-11-20 NOTE — Anesthesia Procedure Notes (Signed)
Procedure Name: LMA Insertion Date/Time: 11/20/2020 1:24 PM Performed by: Milford Cage, CRNA Pre-anesthesia Checklist: Patient identified, Emergency Drugs available, Suction available and Patient being monitored Patient Re-evaluated:Patient Re-evaluated prior to induction Oxygen Delivery Method: Circle system utilized Preoxygenation: Pre-oxygenation with 100% oxygen Induction Type: IV induction Ventilation: Mask ventilation without difficulty LMA: LMA inserted LMA Size: 4.0 Number of attempts: 1 Tube secured with: Tape Dental Injury: Teeth and Oropharynx as per pre-operative assessment

## 2020-11-20 NOTE — Interval H&P Note (Signed)
History and Physical Interval Note:  11/20/2020 12:53 PM  Gerald Valdez  has presented today for surgery, with the diagnosis of BLADDER CANCER.  The various methods of treatment have been discussed with the patient and family. After consideration of risks, benefits and other options for treatment, the patient has consented to  Procedure(s): TRANSURETHRAL RESECTION OF BLADDER TUMOR WITH GEMCITABINE (N/A) CYSTOSCOPY WITH RETROGRADE PYELOGRAM (Bilateral) as a surgical intervention.  The patient's history has been reviewed, patient examined, no change in status, stable for surgery.  I have reviewed the patient's chart and labs.  Questions were answered to the patient's satisfaction.     Lillette Boxer Khamora Karan

## 2020-11-20 NOTE — Anesthesia Postprocedure Evaluation (Signed)
Anesthesia Post Note  Patient: Gerald Valdez  Procedure(s) Performed: TRANSURETHRAL RESECTION OF BLADDER TUMOR (Bladder) CYSTOSCOPY WITH RETROGRADE PYELOGRAM (Bilateral: Ureter)     Patient location during evaluation: PACU Anesthesia Type: General Level of consciousness: sedated Pain management: pain level controlled Vital Signs Assessment: post-procedure vital signs reviewed and stable Respiratory status: spontaneous breathing and respiratory function stable Cardiovascular status: stable Postop Assessment: no apparent nausea or vomiting Anesthetic complications: no   No notable events documented.  Last Vitals:  Vitals:   11/20/20 1545 11/20/20 1605  BP: (!) 145/78 (!) 156/66  Pulse: 98 91  Resp: (!) 22 18  Temp: 36.4 C 36.7 C  SpO2: 100% 100%    Last Pain:  Vitals:   11/20/20 1605  TempSrc: Oral  PainSc: 0-No pain                 [name]

## 2020-11-21 ENCOUNTER — Encounter (HOSPITAL_COMMUNITY): Payer: Self-pay | Admitting: Urology

## 2020-11-21 DIAGNOSIS — I129 Hypertensive chronic kidney disease with stage 1 through stage 4 chronic kidney disease, or unspecified chronic kidney disease: Secondary | ICD-10-CM | POA: Diagnosis not present

## 2020-11-21 DIAGNOSIS — Z87891 Personal history of nicotine dependence: Secondary | ICD-10-CM | POA: Diagnosis not present

## 2020-11-21 DIAGNOSIS — J449 Chronic obstructive pulmonary disease, unspecified: Secondary | ICD-10-CM | POA: Diagnosis not present

## 2020-11-21 DIAGNOSIS — C678 Malignant neoplasm of overlapping sites of bladder: Secondary | ICD-10-CM | POA: Diagnosis not present

## 2020-11-21 DIAGNOSIS — N189 Chronic kidney disease, unspecified: Secondary | ICD-10-CM | POA: Diagnosis not present

## 2020-11-21 DIAGNOSIS — I2510 Atherosclerotic heart disease of native coronary artery without angina pectoris: Secondary | ICD-10-CM | POA: Diagnosis not present

## 2020-11-21 MED ORDER — CEPHALEXIN 500 MG PO CAPS: 500.0000 mg | ORAL_CAPSULE | Freq: Two times a day (BID) | ORAL | 0 refills | Status: DC

## 2020-11-21 NOTE — Progress Notes (Signed)
AVS reviewed with pt and pt's wife. Pt and pt's wife educated on discharge instruction including care of foley bag, and follow-up appointment with urology for foley removal. Pt IV taken out and pt given extra foley drainage bag. All questions answered. Oralia Rud, RN

## 2020-11-21 NOTE — Discharge Instructions (Signed)

## 2020-11-21 NOTE — Discharge Summary (Signed)
Patient ID: DUSTIN BUMBAUGH MRN: [id number] DOB/AGE: 1928-04-06 87 y.o.  Admit date: 11/20/2020 Discharge date: 11/21/2020  Primary Care Physician:  Prince Solian, MD  Discharge Diagnoses:  Bladder cancer Present on Admission:  Malignant neoplasm of overlapping sites of bladder [facility])    Discharge Medications: Allergies as of 11/21/2020   No Known Allergies      Medication List     TAKE these medications    amLODipine 5 MG tablet Commonly known as: NORVASC Take 5 mg by mouth daily with breakfast.   cephALEXin 500 MG capsule Commonly known as: Keflex Take 1 capsule (500 mg total) by mouth 2 (two) times daily.   escitalopram 10 MG tablet Commonly known as: LEXAPRO Take 10 mg by mouth daily.   loratadine 10 MG tablet Commonly known as: CLARITIN Take 10 mg by mouth daily.   multivitamin with minerals Tabs tablet Take 1 tablet by mouth daily with breakfast.   OXYGEN Inhale 3 L into the lungs continuous.   pantoprazole 40 MG tablet Commonly known as: Protonix Take 1 tablet (40 mg total) by mouth 2 (two) times daily. What changed: when to take this   ramipril 10 MG capsule Commonly known as: ALTACE Take 10 mg by mouth daily with breakfast.   rosuvastatin 20 MG tablet Commonly known as: CRESTOR Take 20 mg by mouth at bedtime.         Significant Diagnostic Studies:  No results found.  Brief H and P: For complete details please refer to admission H and P, but in brief pt admitted for TURBT for recurrent bladder cancer  Hospital Course: No postop roblems, sent home on pod 1 w/ catheter   Day of Discharge BP 132/69 (BP Location: Right Arm)   Pulse 79   Temp 98 F (36.7 C)   Resp 20   Ht 5' 6.5" (1.689 m)   Wt 61 kg   SpO2 99%   BMI 21.38 kg/m   No results found for this or any previous visit (from the past 24 hour(s)).  Physical Exam: General: Alert and awake oriented x3 not in any acute distress. HEENT: anicteric sclera, pupils  reactive to light and accommodation CVS: S1-S2 clear no murmur rubs or gallops Chest: clear to auscultation bilaterally, no wheezing rales or rhonchi Abdomen: soft nontender, nondistended, normal bowel sounds, no organomegaly Extremities: no cyanosis, clubbing or edema noted bilaterally Neuro: Cranial nerves II-XII intact, no focal neurological deficits  Disposition:  Home  Diet:  Regular  Activity:  No restrictions    DISCHARGE FOLLOW-UP   Follow-up Information     Franchot Gallo, MD Follow up.   Specialty: Urology Why: we will call you to set up an appt to remove the catheter Contact information: [address] [place] [phone]                 Time spent on Discharge:   10 mins  Signed: [place] 11/21/2020, 8:02 AM

## 2020-11-24 LAB — SURGICAL PATHOLOGY

## 2020-11-28 DIAGNOSIS — C679 Malignant neoplasm of bladder, unspecified: Secondary | ICD-10-CM | POA: Diagnosis not present

## 2020-12-09 DIAGNOSIS — C670 Malignant neoplasm of trigone of bladder: Secondary | ICD-10-CM | POA: Diagnosis not present

## 2020-12-09 DIAGNOSIS — R310 Gross hematuria: Secondary | ICD-10-CM | POA: Diagnosis not present

## 2020-12-12 DIAGNOSIS — C679 Malignant neoplasm of bladder, unspecified: Secondary | ICD-10-CM | POA: Diagnosis not present

## 2020-12-16 ENCOUNTER — Other Ambulatory Visit: Payer: Self-pay | Admitting: Urology

## 2020-12-19 DIAGNOSIS — I700 Atherosclerosis of aorta: Secondary | ICD-10-CM | POA: Diagnosis not present

## 2020-12-19 DIAGNOSIS — I129 Hypertensive chronic kidney disease with stage 1 through stage 4 chronic kidney disease, or unspecified chronic kidney disease: Secondary | ICD-10-CM | POA: Diagnosis not present

## 2020-12-19 DIAGNOSIS — I2510 Atherosclerotic heart disease of native coronary artery without angina pectoris: Secondary | ICD-10-CM | POA: Diagnosis not present

## 2020-12-19 DIAGNOSIS — J9601 Acute respiratory failure with hypoxia: Secondary | ICD-10-CM | POA: Diagnosis not present

## 2020-12-19 DIAGNOSIS — F32A Depression, unspecified: Secondary | ICD-10-CM | POA: Diagnosis not present

## 2020-12-19 DIAGNOSIS — E785 Hyperlipidemia, unspecified: Secondary | ICD-10-CM | POA: Diagnosis not present

## 2020-12-19 DIAGNOSIS — R0600 Dyspnea, unspecified: Secondary | ICD-10-CM | POA: Diagnosis not present

## 2020-12-19 DIAGNOSIS — M1990 Unspecified osteoarthritis, unspecified site: Secondary | ICD-10-CM | POA: Diagnosis not present

## 2020-12-19 DIAGNOSIS — C679 Malignant neoplasm of bladder, unspecified: Secondary | ICD-10-CM | POA: Diagnosis not present

## 2020-12-19 DIAGNOSIS — I471 Supraventricular tachycardia: Secondary | ICD-10-CM | POA: Diagnosis not present

## 2020-12-19 DIAGNOSIS — J441 Chronic obstructive pulmonary disease with (acute) exacerbation: Secondary | ICD-10-CM | POA: Diagnosis not present

## 2020-12-19 DIAGNOSIS — N1832 Chronic kidney disease, stage 3b: Secondary | ICD-10-CM | POA: Diagnosis not present

## 2021-01-12 DIAGNOSIS — L570 Actinic keratosis: Secondary | ICD-10-CM | POA: Diagnosis not present

## 2021-01-12 DIAGNOSIS — C44311 Basal cell carcinoma of skin of nose: Secondary | ICD-10-CM | POA: Diagnosis not present

## 2021-01-12 DIAGNOSIS — C44219 Basal cell carcinoma of skin of left ear and external auricular canal: Secondary | ICD-10-CM | POA: Diagnosis not present

## 2021-01-12 DIAGNOSIS — C44319 Basal cell carcinoma of skin of other parts of face: Secondary | ICD-10-CM | POA: Diagnosis not present

## 2021-01-15 NOTE — Patient Instructions (Signed)
DUE TO COVID-19 ONLY ONE VISITOR IS ALLOWED TO COME WITH YOU AND STAY IN THE WAITING ROOM ONLY DURING PRE OP AND PROCEDURE.   **NO VISITORS ARE ALLOWED IN THE SHORT STAY AREA OR RECOVERY ROOM!!**  IF YOU WILL BE ADMITTED INTO THE HOSPITAL YOU ARE ALLOWED ONLY TWO SUPPORT PEOPLE DURING VISITATION HOURS ONLY (7 AM -8PM)   The support person(s) must pass our screening, gel in and out, and wear a mask at all times, including in the patient's room. Patients must also wear a mask when staff or their support person are in the room. Visitors GUEST BADGE MUST BE WORN VISIBLY  One adult visitor may remain with you overnight and MUST be in the room by 8 P.M.  No visitors under the age of 2. Any visitor under the age of 74 must be accompanied by an adult.     Your procedure is scheduled on: 02/12/21   Report to [facility] Main Entrance    Report to admitting at: 6:00 AM   Call this number if you have problems the morning of surgery [phone]   Do not eat food :After Midnight.   May have liquids until : 5:15 AM   day of surgery  CLEAR LIQUID DIET  Foods Allowed                                                                     Foods Excluded  Water, Black Coffee and tea, regular and decaf                             liquids that you cannot  Plain Jell-O in any flavor  (No red)                                           see through such as: Fruit ices (not with fruit pulp)                                     milk, soups, orange juice              Iced Popsicles (No red)                                    All solid food                                   Apple juices Sports drinks like Gatorade (No red) Lightly seasoned clear broth or consume(fat free) Sugar  Sample Menu Breakfast                                Lunch  Supper Cranberry juice                    Beef broth                            Chicken broth Jell-O                                      Grape juice                           Apple juice Coffee or tea                        Jell-O                                      Popsicle                                                Coffee or tea                        Coffee or tea    Oral Hygiene is also important to reduce your risk of infection.                                    Remember - BRUSH YOUR TEETH THE MORNING OF SURGERY WITH YOUR REGULAR TOOTHPASTE   Do NOT smoke after Midnight   Take these medicines the morning of surgery with A SIP OF WATER: escitalopram,loratadine,amlodipine,pantoprazole                              You may not have any metal on your body including hair pins, jewelry, and body piercing             Do not wear  lotions, powders, perfumes/cologne, or deodorant              Men may shave face and neck.   Do not bring valuables to the hospital. [place].   Contacts, dentures or bridgework may not be worn into surgery.   Bring small overnight bag day of surgery.    Patients discharged on the day of surgery will not be allowed to drive home.   Special Instructions: Bring a copy of your healthcare power of attorney and living will documents         the day of surgery if you haven't scanned them before.              Please read over the following fact sheets you were given: IF YOU HAVE QUESTIONS ABOUT YOUR PRE-OP INSTRUCTIONS PLEASE CALL [phone]     [facility] - Preparing for Surgery Before surgery, you can play an important role.  Because skin is not sterile, your skin needs to be as free of  germs as possible.  You can reduce the number of germs on your skin by washing with CHG (chlorahexidine gluconate) soap before surgery.  CHG is an antiseptic cleaner which kills germs and bonds with the skin to continue killing germs even after washing. Please DO NOT use if you have an allergy to CHG or antibacterial soaps.  If your skin becomes  reddened/irritated stop using the CHG and inform your nurse when you arrive at Short Stay. Do not shave (including legs and underarms) for at least 48 hours prior to the first CHG shower.  You may shave your face/neck. Please follow these instructions carefully:  1.  Shower with CHG Soap the night before surgery and the  morning of Surgery.  2.  If you choose to wash your hair, wash your hair first as usual with your  normal  shampoo.  3.  After you shampoo, rinse your hair and body thoroughly to remove the  shampoo.                           4.  Use CHG as you would any other liquid soap.  You can apply chg directly  to the skin and wash                       Gently with a scrungie or clean washcloth.  5.  Apply the CHG Soap to your body ONLY FROM THE NECK DOWN.   Do not use on face/ open                           Wound or open sores. Avoid contact with eyes, ears mouth and genitals (private parts).                       Wash face,  Genitals (private parts) with your normal soap.             6.  Wash thoroughly, paying special attention to the area where your surgery  will be performed.  7.  Thoroughly rinse your body with warm water from the neck down.  8.  DO NOT shower/wash with your normal soap after using and rinsing off  the CHG Soap.                9.  Pat yourself dry with a clean towel.            10.  Wear clean pajamas.            11.  Place clean sheets on your bed the night of your first shower and do not  sleep with pets. Day of Surgery : Do not apply any lotions/deodorants the morning of surgery.  Please wear clean clothes to the hospital/surgery center.  FAILURE TO FOLLOW THESE INSTRUCTIONS MAY RESULT IN THE CANCELLATION OF YOUR SURGERY PATIENT SIGNATURE_________________________________  NURSE SIGNATURE__________________________________  ________________________________________________________________________

## 2021-01-19 ENCOUNTER — Encounter (HOSPITAL_COMMUNITY)
Admission: RE | Admit: 2021-01-19 | Discharge: 2021-01-19 | Disposition: A | Discharge status: Home / Self Care | Payer: Medicare Other | Source: Ambulatory Visit | Attending: Urology | Admitting: Urology

## 2021-01-19 ENCOUNTER — Encounter (HOSPITAL_COMMUNITY): Payer: Self-pay

## 2021-01-19 ENCOUNTER — Other Ambulatory Visit: Payer: Self-pay

## 2021-01-19 VITALS — BP 139/52 | HR 80 | Temp 97.7°F | Ht 66.5 in

## 2021-01-19 DIAGNOSIS — Z01812 Encounter for preprocedural laboratory examination: Secondary | ICD-10-CM | POA: Diagnosis not present

## 2021-01-19 DIAGNOSIS — I10 Essential (primary) hypertension: Secondary | ICD-10-CM | POA: Insufficient documentation

## 2021-01-19 HISTORY — DX: Unspecified osteoarthritis, unspecified site: M19.90

## 2021-01-19 LAB — BASIC METABOLIC PANEL
Anion gap: 7 (ref 5–15)
BUN: 24 mg/dL — ABNORMAL HIGH (ref 8–23)
CO2: 30 mmol/L (ref 22–32)
Calcium: 8.9 mg/dL (ref 8.9–10.3)
Chloride: 100 mmol/L (ref 98–111)
Creatinine, Ser: 1.65 mg/dL — ABNORMAL HIGH (ref 0.61–1.24)
GFR, Estimated: 39 mL/min — ABNORMAL LOW (ref >60)
Glucose, Bld: 107 mg/dL — ABNORMAL HIGH (ref 70–99)
Potassium: 4.8 mmol/L (ref 3.5–5.1)
Sodium: 137 mmol/L (ref 135–145)

## 2021-01-19 LAB — CBC
HCT: 34.1 % — ABNORMAL LOW (ref 39.0–52.0)
Hemoglobin: 10.4 g/dL — ABNORMAL LOW (ref 13.0–17.0)
MCH: 29 pg (ref 26.0–34.0)
MCHC: 30.5 g/dL (ref 30.0–36.0)
MCV: 95 fL (ref 80.0–100.0)
Platelets: 220 10*3/uL (ref 150–400)
RBC: 3.59 MIL/uL — ABNORMAL LOW (ref 4.22–5.81)
RDW: 13.2 % (ref 11.5–15.5)
WBC: 9.8 10*3/uL (ref 4.0–10.5)
nRBC: 0 % (ref 0.0–0.2)

## 2021-01-19 NOTE — Progress Notes (Addendum)
COVID Vaccine Completed: Yes Date COVID Vaccine completed: 12/16/20 x 4 COVID vaccine manufacturer: Pfizer     COVID Test: N/A PCP - Dr. Prince Solian  Cardiologist - Dr. Mertie Moores: Clearance: 11/06/20  Chest x-ray - 02/10/20 EKG - 02/12/20 Stress Test -  ECHO - 02/11/20 Cardiac Cath -  Pacemaker/ICD device last checked:  Sleep Study -  CPAP -   Fasting Blood Sugar -  Checks Blood Sugar _____ times a day  Blood Thinner Instructions: Aspirin Instructions: Last Dose:  Anesthesia review: Hx: COPD,HTN,CKD,Continuous oxigen @ 3L.,CAD.  Patient denies shortness of breath, fever, cough and chest pain at PAT appointment   Patient verbalized understanding of instructions that were given to them at the PAT appointment. Patient was also instructed that they will need to review over the PAT instructions again at home before surgery.

## 2021-01-19 NOTE — Progress Notes (Signed)
Lab. Results: Creatinine: 1.65

## 2021-01-28 DIAGNOSIS — C679 Malignant neoplasm of bladder, unspecified: Secondary | ICD-10-CM | POA: Diagnosis not present

## 2021-01-28 DIAGNOSIS — R3914 Feeling of incomplete bladder emptying: Secondary | ICD-10-CM | POA: Diagnosis not present

## 2021-02-11 NOTE — Anesthesia Preprocedure Evaluation (Addendum)
Anesthesia Evaluation  Patient identified by MRN, date of birth, ID band Patient awake    Reviewed: Allergy & Precautions, NPO status , Patient's Chart, lab work & pertinent test results  Airway Mallampati: II  TM Distance: >3 FB     Dental   Pulmonary former smoker,    breath sounds clear to auscultation       Cardiovascular hypertension, + CAD   Rhythm:Regular Rate:Normal     Neuro/Psych    GI/Hepatic PUD, GERD  ,  Endo/Other    Renal/GU Renal disease     Musculoskeletal   Abdominal   Peds  Hematology   Anesthesia Other Findings   Reproductive/Obstetrics                            Anesthesia Physical Anesthesia Plan  ASA: 3  Anesthesia Plan: General   Post-op Pain Management:    Induction:   PONV Risk Score and Plan: Ondansetron and Dexamethasone  Airway Management Planned: LMA  Additional Equipment:   Intra-op Plan:   Post-operative Plan: Extubation in OR  Informed Consent: I have reviewed the patients History and Physical, chart, labs and discussed the procedure including the risks, benefits and alternatives for the proposed anesthesia with the patient or authorized representative who has indicated his/her understanding and acceptance.       Plan Discussed with: Anesthesiologist and CRNA  Anesthesia Plan Comments:        Anesthesia Quick Evaluation

## 2021-02-11 NOTE — H&P (Signed)
H&P  Chief Complaint: Bladder cancer  History of Present Illness: 88 yo male presents for repeat TURBT for mgmt of a large TCCa of bladder iitially resected 10.13.2022. Path--HG NMIBC. He presents for repeat resection per protocol.  Past Medical History:  Diagnosis Date   Acute duodenal ulcer with bleeding 01/30/2014   Acute post-hemorrhagic anemia 01/30/2014   Anxiety    Aortic atherosclerosis ([place]) 02/10/2020   Arthritis    Cancer ([place])    bladder   Chronic kidney disease    COPD exacerbation ([place]) 02/10/2020   Coronary artery disease    Dyspnea    Dyspnea on exertion 02/10/2020   Emphysema of lung ([place])    Essential hypertension 02/10/2020   GERD (gastroesophageal reflux disease) 02/10/2020   GI bleed 01/30/2014   Hyperlipidemia 02/10/2020    Past Surgical History:  Procedure Laterality Date   BLADDER SURGERY     CYSTOSCOPY W/ RETROGRADES Bilateral 11/20/2020   Procedure: CYSTOSCOPY WITH RETROGRADE PYELOGRAM;  Surgeon: Franchot Gallo, MD;  Location: [facility];  Service: Urology;  Laterality: Bilateral;   ESOPHAGOGASTRODUODENOSCOPY N/A 01/30/2014   Procedure: ESOPHAGOGASTRODUODENOSCOPY (EGD);  Surgeon: Inda Castle, MD;  Location: [name];  Service: Endoscopy;  Laterality: N/A;   TONSILLECTOMY     TRANSURETHRAL RESECTION OF BLADDER TUMOR WITH MITOMYCIN-C N/A 11/20/2020   Procedure: TRANSURETHRAL RESECTION OF BLADDER TUMOR;  Surgeon: Franchot Gallo, MD;  Location: [facility];  Service: Urology;  Laterality: N/A;    Home Medications:  Allergies as of 02/11/2021   No Known Allergies      Medication List      Notice   Cannot display discharge medications because the patient has not yet been admitted.     Allergies: No Known Allergies  Family History  Problem Relation Age of Onset   Stroke Father     Social History:  reports that he quit smoking about 29 years ago. His smoking use included cigarettes. He started smoking about 76 years ago. He has never  used smokeless tobacco. He reports that he does not currently use alcohol. He reports that he does not use drugs.  ROS: A complete review of systems was performed.  All systems are negative except for pertinent findings as noted.  Physical Exam:  Vital signs in last 24 hours: There were no vitals taken for this visit. Constitutional:  Alert and oriented, No acute distress Cardiovascular: Regular rate  Respiratory: Normal respiratory effort Neurologic: Grossly intact, no focal deficits Psychiatric: Normal mood and affect  I have reviewed prior pt notes  I have reviewed notes from referring/previous physicians  I have reviewed urinalysis results  I have independently reviewed prior imaging  I have reviewed prior PSA results  I have reviewed prior urine culture   Impression/Assessment:  HG NMIBC, prior resection in October  Plan:  TURBT possible gemcitabine, Rt RGP

## 2021-02-12 ENCOUNTER — Encounter (HOSPITAL_COMMUNITY): Payer: Self-pay | Admitting: Urology

## 2021-02-12 ENCOUNTER — Ambulatory Visit (HOSPITAL_COMMUNITY): Payer: Medicare Other

## 2021-02-12 ENCOUNTER — Ambulatory Visit (HOSPITAL_COMMUNITY): Payer: Medicare Other | Admitting: Anesthesiology

## 2021-02-12 ENCOUNTER — Ambulatory Visit (HOSPITAL_COMMUNITY): Payer: Medicare Other | Admitting: Physician Assistant

## 2021-02-12 ENCOUNTER — Encounter (HOSPITAL_COMMUNITY)
Admission: RE | Disposition: A | Discharge status: Home / Self Care | Payer: Self-pay | Source: Ambulatory Visit | Attending: Urology

## 2021-02-12 ENCOUNTER — Ambulatory Visit (HOSPITAL_COMMUNITY)
Admission: RE | Admit: 2021-02-12 | Discharge: 2021-02-12 | Disposition: A | Discharge status: Home / Self Care | Payer: Medicare Other | Source: Ambulatory Visit | Attending: Urology | Admitting: Urology

## 2021-02-12 DIAGNOSIS — I10 Essential (primary) hypertension: Secondary | ICD-10-CM | POA: Diagnosis not present

## 2021-02-12 DIAGNOSIS — N3289 Other specified disorders of bladder: Secondary | ICD-10-CM | POA: Diagnosis not present

## 2021-02-12 DIAGNOSIS — Z87891 Personal history of nicotine dependence: Secondary | ICD-10-CM | POA: Insufficient documentation

## 2021-02-12 DIAGNOSIS — N3020 Other chronic cystitis without hematuria: Secondary | ICD-10-CM | POA: Diagnosis not present

## 2021-02-12 DIAGNOSIS — C679 Malignant neoplasm of bladder, unspecified: Secondary | ICD-10-CM | POA: Insufficient documentation

## 2021-02-12 DIAGNOSIS — K219 Gastro-esophageal reflux disease without esophagitis: Secondary | ICD-10-CM | POA: Insufficient documentation

## 2021-02-12 DIAGNOSIS — I2510 Atherosclerotic heart disease of native coronary artery without angina pectoris: Secondary | ICD-10-CM | POA: Insufficient documentation

## 2021-02-12 DIAGNOSIS — K279 Peptic ulcer, site unspecified, unspecified as acute or chronic, without hemorrhage or perforation: Secondary | ICD-10-CM | POA: Diagnosis not present

## 2021-02-12 DIAGNOSIS — C678 Malignant neoplasm of overlapping sites of bladder: Secondary | ICD-10-CM | POA: Diagnosis not present

## 2021-02-12 HISTORY — PX: CYSTOSCOPY W/ RETROGRADES: SHX1426

## 2021-02-12 HISTORY — PX: TRANSURETHRAL RESECTION OF BLADDER TUMOR: SHX2575

## 2021-02-12 SURGERY — TURBT (TRANSURETHRAL RESECTION OF BLADDER TUMOR)
Anesthesia: General | Laterality: Right

## 2021-02-12 MED ORDER — BELLADONNA ALKALOIDS-OPIUM 16.2-30 MG RE SUPP
RECTAL | Status: AC
  Filled 2021-02-12: qty 1

## 2021-02-12 MED ORDER — ORAL CARE MOUTH RINSE: 15.0000 mL | Freq: Once | OROMUCOSAL | Status: AC

## 2021-02-12 MED ORDER — LIDOCAINE HCL URETHRAL/MUCOSAL 2 % EX GEL
CUTANEOUS | Status: AC
  Filled 2021-02-12: qty 30

## 2021-02-12 MED ORDER — FENTANYL CITRATE PF 50 MCG/ML IJ SOSY: 25.0000 ug | PREFILLED_SYRINGE | INTRAMUSCULAR | Status: DC | PRN

## 2021-02-12 MED ORDER — PHENYLEPHRINE 40 MCG/ML (10ML) SYRINGE FOR IV PUSH (FOR BLOOD PRESSURE SUPPORT)
PREFILLED_SYRINGE | INTRAVENOUS | Status: DC | PRN
Start: 2021-02-12 — End: 2021-02-12
  Administered 2021-02-12 (×2): 80 ug via INTRAVENOUS
  Administered 2021-02-12: 160 ug via INTRAVENOUS
  Administered 2021-02-12: 80 ug via INTRAVENOUS

## 2021-02-12 MED ORDER — SODIUM CHLORIDE (PF) 0.9 % IJ SOLN
INTRAMUSCULAR | Status: DC | PRN
  Administered 2021-02-12: 10 mL

## 2021-02-12 MED ORDER — CEFAZOLIN SODIUM-DEXTROSE 2-4 GM/100ML-% IV SOLN
2.0000 g | INTRAVENOUS | Status: AC
  Administered 2021-02-12: 2 g via INTRAVENOUS
  Filled 2021-02-12: qty 100

## 2021-02-12 MED ORDER — CHLORHEXIDINE GLUCONATE 0.12 % MT SOLN
15.0000 mL | Freq: Once | OROMUCOSAL | Status: AC
  Administered 2021-02-12: 15 mL via OROMUCOSAL

## 2021-02-12 MED ORDER — PROPOFOL 10 MG/ML IV BOLUS
INTRAVENOUS | Status: DC | PRN
Start: 2021-02-12 — End: 2021-02-12
  Administered 2021-02-12: 100 mg via INTRAVENOUS

## 2021-02-12 MED ORDER — FENTANYL CITRATE (PF) 100 MCG/2ML IJ SOLN
INTRAMUSCULAR | Status: DC | PRN
  Administered 2021-02-12 (×2): 50 ug via INTRAVENOUS

## 2021-02-12 MED ORDER — SODIUM CHLORIDE 0.9 % IR SOLN
Status: DC | PRN
  Administered 2021-02-12: 3000 mL

## 2021-02-12 MED ORDER — STERILE WATER FOR IRRIGATION IR SOLN
Status: DC | PRN
  Administered 2021-02-12: 3000 mL

## 2021-02-12 MED ORDER — CEPHALEXIN 500 MG PO CAPS: 500.0000 mg | ORAL_CAPSULE | Freq: Two times a day (BID) | ORAL | 0 refills | Status: DC

## 2021-02-12 MED ORDER — INDIGOTINDISULFONATE SODIUM 8 MG/ML IJ SOLN
INTRAMUSCULAR | Status: AC
  Filled 2021-02-12: qty 5

## 2021-02-12 MED ORDER — ONDANSETRON HCL 4 MG/2ML IJ SOLN
INTRAMUSCULAR | Status: DC | PRN
  Administered 2021-02-12: 4 mg via INTRAVENOUS

## 2021-02-12 MED ORDER — LACTATED RINGERS IV SOLN: INTRAVENOUS | Status: DC

## 2021-02-12 MED ORDER — FENTANYL CITRATE (PF) 100 MCG/2ML IJ SOLN
INTRAMUSCULAR | Status: AC
  Filled 2021-02-12: qty 2

## 2021-02-12 MED ORDER — LIDOCAINE 2% (20 MG/ML) 5 ML SYRINGE
INTRAMUSCULAR | Status: DC | PRN
  Administered 2021-02-12: 40 mg via INTRAVENOUS

## 2021-02-12 MED ORDER — PROPOFOL 10 MG/ML IV BOLUS
INTRAVENOUS | Status: AC
  Filled 2021-02-12: qty 20

## 2021-02-12 MED ORDER — DEXAMETHASONE SODIUM PHOSPHATE 10 MG/ML IJ SOLN
INTRAMUSCULAR | Status: DC | PRN
  Administered 2021-02-12: 5 mg via INTRAVENOUS

## 2021-02-12 SURGICAL SUPPLY — 30 items
BAG COUNTER SPONGE SURGICOUNT (BAG) IMPLANT
BAG DRN RND TRDRP ANRFLXCHMBR (UROLOGICAL SUPPLIES) ×2
BAG SPNG CNTER NS LX DISP (BAG)
BAG URINE DRAIN 2000ML AR STRL (UROLOGICAL SUPPLIES) ×1 IMPLANT
BAG URO CATCHER STRL LF (MISCELLANEOUS) ×3 IMPLANT
CATH FOLEY 2WAY SLVR  5CC 20FR (CATHETERS) ×3
CATH FOLEY 2WAY SLVR 30CC 24FR (CATHETERS) IMPLANT
CATH FOLEY 2WAY SLVR 5CC 20FR (CATHETERS) IMPLANT
CATH URETL OPEN END 6FR 70 (CATHETERS) ×3 IMPLANT
CLOTH BEACON ORANGE TIMEOUT ST (SAFETY) ×3 IMPLANT
DRAPE FOOT SWITCH (DRAPES) ×3 IMPLANT
ELECT REM PT RETURN 15FT ADLT (MISCELLANEOUS) ×3 IMPLANT
EVACUATOR MICROVAS BLADDER (UROLOGICAL SUPPLIES) IMPLANT
GLOVE SURG ENC TEXT LTX SZ8 (GLOVE) ×3 IMPLANT
GOWN STRL REUS W/ TWL XL LVL3 (GOWN DISPOSABLE) ×2 IMPLANT
GOWN STRL REUS W/TWL XL LVL3 (GOWN DISPOSABLE) ×6 IMPLANT
GUIDEWIRE STR DUAL SENSOR (WIRE) ×3 IMPLANT
KIT TURNOVER KIT A (KITS) IMPLANT
LOOP CUT BIPOLAR 24F LRG (ELECTROSURGICAL) ×1 IMPLANT
LOOP MONOPOLAR YLW (ELECTROSURGICAL) ×1 IMPLANT
MANIFOLD NEPTUNE II (INSTRUMENTS) ×3 IMPLANT
NDL SAFETY ECLIPSE 18X1.5 (NEEDLE) ×2 IMPLANT
NEEDLE HYPO 18GX1.5 SHARP (NEEDLE) ×3
PACK CYSTO (CUSTOM PROCEDURE TRAY) ×3 IMPLANT
PENCIL SMOKE EVACUATOR (MISCELLANEOUS) IMPLANT
SYR TOOMEY IRRIG 70ML (MISCELLANEOUS)
SYRINGE TOOMEY IRRIG 70ML (MISCELLANEOUS) IMPLANT
TUBING CONNECTING 10 (TUBING) ×3 IMPLANT
TUBING UROLOGY SET (TUBING) ×3 IMPLANT
WATER STERILE IRR 3000ML UROMA (IV SOLUTION) ×3 IMPLANT

## 2021-02-12 NOTE — Op Note (Signed)
Preoperative diagnosis: History of high-grade nonmuscle invasive bladder cancer, status post recent resection  Postoperative diagnosis: Same, with area of persistent tumor 2 cm in size in the posterior bladder wall   Procedure: Cystoscopy, right retrograde ureteropyelogram, fluoroscopic interpretation, TURBT 2 cm lesion  Surgeon: Taleyah Hillman  Specimen: Bladder tumor fragments  Anesthesia: General with LMA  Estimated blood loss: Less than 25 mL  Drains: 20 French Foley catheter  Complications: None  Indications: 88 year old male status post resection of a large 6 cm bladder tumor in October 2022.  This revealed high-grade nonmuscle invasive bladder cancer.  He had a normal left retrograde pyelogram, right could not be performed due to the fact that the right ureteral orifice was obscured by the bladder tumor.  He presents at this time for cystoscopy, attempted right retrograde as well as repeat resection of bladder tumor due to the high-grade nature of the initial lesion.  I discussed the procedure, risks, complications and expected outcome with the patient and his wife.  They understand these and desires to proceed.  Findings: Urethra was normal without stricture.  Prostate was nonobstructive.  Left ureteral orifice was normal, right was somewhat laterally positioned, most likely due to prior resection.  There was a persistent urothelial lesion in the posterior midline between 1.5 and 2 cm in size.  No other urothelial lesions were noted, but evidence was present of prior resection.  Retrograde study of the right ureter and pyelocalyceal system revealed mild dilatation of the ureter throughout with very mild pyelocaliectasis.  There was no evidence of filling defects either in the pyelocalyceal system or the ureter.  Description of procedure: The patient was properly identified in the holding area.  He was taken to the operating room where general anesthetic was administered with the LMA.  He  was placed in the dorsolithotomy position.  Recently placed Foley catheter was removed.  Genitalia and perineum were prepped, draped, proper timeout performed.  11 French panendoscope was advanced under direct vision.  Above-mentioned findings were noted.  Utilizing 6 French open-ended catheter and Omnipaque right retrograde ureteropyelogram was performed.  No evidence of filling defects noted.  The cystoscope was removed and using the visual obturator a 26 French resectoscope sheath was placed.  Cutting loop and resectoscope were then placed.  The lesion in the posterior midline was resected into the muscular layer.  No other urothelial lesions were noted and none were resected.  Once the abnormal area was resected, careful coagulation was performed and no bleeding was seen at this point.  Small fragments were irrigated from the bladder and sent labeled "bladder tumor".  Reinspection of the resected site revealed adequate hemostasis.  The scope was removed, 69 [place] Foley catheter was placed, balloon filled with 10 cc of water and this was hooked to dependent drainage.  At this point the procedure was terminated.  The patient was awakened, taken to the PACU in stable condition, having tolerated the procedure well.  We will call the patient to set up a voiding trial within the next week.

## 2021-02-12 NOTE — Anesthesia Postprocedure Evaluation (Signed)
Anesthesia Post Note  Patient: Gerald Valdez  Procedure(s) Performed: REPEAT TRANSURETHRAL RESECTION OF BLADDER TUMOR (TURBT) CYSTOSCOPY WITH RETROGRADE PYELOGRAM (Right)     Patient location during evaluation: PACU Anesthesia Type: General Level of consciousness: awake Pain management: pain level controlled Vital Signs Assessment: post-procedure vital signs reviewed and stable Respiratory status: spontaneous breathing Cardiovascular status: stable Postop Assessment: no apparent nausea or vomiting Anesthetic complications: no   No notable events documented.  Last Vitals:  Vitals:   02/12/21 0945 02/12/21 1033  BP: (!) 143/71 137/62  Pulse: 96 96  Resp: 20 17  Temp:  36.4 C  SpO2: 97% 97%    Last Pain:  Vitals:   02/12/21 1033  TempSrc: Oral  PainSc: 0-No pain                 Sehaj Kolden

## 2021-02-12 NOTE — Interval H&P Note (Signed)
History and Physical Interval Note:  02/12/2021 7:30 AM  Gerald Valdez  has presented today for surgery, with the diagnosis of BLADDER CANCER.  The various methods of treatment have been discussed with the patient and family. After consideration of risks, benefits and other options for treatment, the patient has consented to  Procedure(s): REPEAT TRANSURETHRAL RESECTION OF BLADDER TUMOR (TURBT) (N/A) CYSTOSCOPY WITH RETROGRADE PYELOGRAM (Right) as a surgical intervention.  The patient's history has been reviewed, patient examined, no change in status, stable for surgery.  I have reviewed the patient's chart and labs.  Questions were answered to the patient's satisfaction.     Lillette Boxer Fatimah Sundquist

## 2021-02-12 NOTE — Anesthesia Procedure Notes (Signed)
Procedure Name: LMA Insertion Date/Time: 02/12/2021 8:33 AM Performed by: Gean Maidens, CRNA Pre-anesthesia Checklist: Patient identified, Emergency Drugs available, Suction available, Patient being monitored and Timeout performed Patient Re-evaluated:Patient Re-evaluated prior to induction Oxygen Delivery Method: Circle system utilized Preoxygenation: Pre-oxygenation with 100% oxygen Induction Type: IV induction Ventilation: Mask ventilation without difficulty LMA: LMA inserted LMA Size: 4.0 Number of attempts: 1 Placement Confirmation: positive ETCO2 and breath sounds checked- equal and bilateral Tube secured with: Tape Dental Injury: Teeth and Oropharynx as per pre-operative assessment

## 2021-02-12 NOTE — Transfer of Care (Signed)
Immediate Anesthesia Transfer of Care Note  Patient: Gerald Valdez  Procedure(s) Performed: REPEAT TRANSURETHRAL RESECTION OF BLADDER TUMOR (TURBT) CYSTOSCOPY WITH RETROGRADE PYELOGRAM (Right)  Patient Location: PACU  Anesthesia Type:General  Level of Consciousness: sedated, patient cooperative and responds to stimulation  Airway & Oxygen Therapy: Patient Spontanous Breathing and Patient connected to face mask oxygen  Post-op Assessment: Report given to RN and Post -op Vital signs reviewed and stable  Post vital signs: Reviewed and stable  Last Vitals:  Vitals Value Taken Time  BP 130/68 02/12/21 0915  Temp    Pulse 79 02/12/21 0916  Resp 16 02/12/21 0916  SpO2 100 % 02/12/21 0916  Vitals shown include unvalidated device data.  Last Pain:  Vitals:   02/12/21 0641  TempSrc: Oral  PainSc:          Complications: No notable events documented.

## 2021-02-12 NOTE — Discharge Instructions (Addendum)

## 2021-02-13 ENCOUNTER — Encounter (HOSPITAL_COMMUNITY): Payer: Self-pay | Admitting: Urology

## 2021-02-16 LAB — SURGICAL PATHOLOGY

## 2021-02-17 DIAGNOSIS — R3914 Feeling of incomplete bladder emptying: Secondary | ICD-10-CM | POA: Diagnosis not present

## 2021-03-06 DIAGNOSIS — C678 Malignant neoplasm of overlapping sites of bladder: Secondary | ICD-10-CM | POA: Diagnosis not present

## 2021-04-07 DIAGNOSIS — E785 Hyperlipidemia, unspecified: Secondary | ICD-10-CM | POA: Diagnosis not present

## 2021-04-07 DIAGNOSIS — N183 Chronic kidney disease, stage 3 unspecified: Secondary | ICD-10-CM | POA: Diagnosis not present

## 2021-04-07 DIAGNOSIS — I129 Hypertensive chronic kidney disease with stage 1 through stage 4 chronic kidney disease, or unspecified chronic kidney disease: Secondary | ICD-10-CM | POA: Diagnosis not present

## 2021-04-07 DIAGNOSIS — J449 Chronic obstructive pulmonary disease, unspecified: Secondary | ICD-10-CM | POA: Diagnosis not present

## 2021-04-13 DIAGNOSIS — L819 Disorder of pigmentation, unspecified: Secondary | ICD-10-CM | POA: Diagnosis not present

## 2021-04-13 DIAGNOSIS — Z08 Encounter for follow-up examination after completed treatment for malignant neoplasm: Secondary | ICD-10-CM | POA: Diagnosis not present

## 2021-04-13 DIAGNOSIS — L570 Actinic keratosis: Secondary | ICD-10-CM | POA: Diagnosis not present

## 2021-04-13 DIAGNOSIS — Z85828 Personal history of other malignant neoplasm of skin: Secondary | ICD-10-CM | POA: Diagnosis not present

## 2021-05-04 DIAGNOSIS — J441 Chronic obstructive pulmonary disease with (acute) exacerbation: Secondary | ICD-10-CM | POA: Diagnosis not present

## 2021-05-04 DIAGNOSIS — I2510 Atherosclerotic heart disease of native coronary artery without angina pectoris: Secondary | ICD-10-CM | POA: Diagnosis not present

## 2021-05-04 DIAGNOSIS — N1832 Chronic kidney disease, stage 3b: Secondary | ICD-10-CM | POA: Diagnosis not present

## 2021-05-04 DIAGNOSIS — I471 Supraventricular tachycardia: Secondary | ICD-10-CM | POA: Diagnosis not present

## 2021-05-04 DIAGNOSIS — M1990 Unspecified osteoarthritis, unspecified site: Secondary | ICD-10-CM | POA: Diagnosis not present

## 2021-05-04 DIAGNOSIS — I129 Hypertensive chronic kidney disease with stage 1 through stage 4 chronic kidney disease, or unspecified chronic kidney disease: Secondary | ICD-10-CM | POA: Diagnosis not present

## 2021-05-04 DIAGNOSIS — F32A Depression, unspecified: Secondary | ICD-10-CM | POA: Diagnosis not present

## 2021-05-04 DIAGNOSIS — J9601 Acute respiratory failure with hypoxia: Secondary | ICD-10-CM | POA: Diagnosis not present

## 2021-05-04 DIAGNOSIS — C679 Malignant neoplasm of bladder, unspecified: Secondary | ICD-10-CM | POA: Diagnosis not present

## 2021-05-04 DIAGNOSIS — E785 Hyperlipidemia, unspecified: Secondary | ICD-10-CM | POA: Diagnosis not present

## 2021-05-04 DIAGNOSIS — R0600 Dyspnea, unspecified: Secondary | ICD-10-CM | POA: Diagnosis not present

## 2021-05-04 DIAGNOSIS — I700 Atherosclerosis of aorta: Secondary | ICD-10-CM | POA: Diagnosis not present

## 2021-05-08 DIAGNOSIS — N183 Chronic kidney disease, stage 3 unspecified: Secondary | ICD-10-CM | POA: Diagnosis not present

## 2021-05-08 DIAGNOSIS — J449 Chronic obstructive pulmonary disease, unspecified: Secondary | ICD-10-CM | POA: Diagnosis not present

## 2021-05-08 DIAGNOSIS — I129 Hypertensive chronic kidney disease with stage 1 through stage 4 chronic kidney disease, or unspecified chronic kidney disease: Secondary | ICD-10-CM | POA: Diagnosis not present

## 2021-05-08 DIAGNOSIS — E785 Hyperlipidemia, unspecified: Secondary | ICD-10-CM | POA: Diagnosis not present

## 2021-06-03 DIAGNOSIS — C678 Malignant neoplasm of overlapping sites of bladder: Secondary | ICD-10-CM | POA: Diagnosis not present

## 2021-06-04 ENCOUNTER — Other Ambulatory Visit: Payer: Self-pay | Admitting: Urology

## 2021-06-11 DIAGNOSIS — E785 Hyperlipidemia, unspecified: Secondary | ICD-10-CM | POA: Diagnosis not present

## 2021-06-11 DIAGNOSIS — J9601 Acute respiratory failure with hypoxia: Secondary | ICD-10-CM | POA: Diagnosis not present

## 2021-06-11 DIAGNOSIS — C679 Malignant neoplasm of bladder, unspecified: Secondary | ICD-10-CM | POA: Diagnosis not present

## 2021-06-11 DIAGNOSIS — I2510 Atherosclerotic heart disease of native coronary artery without angina pectoris: Secondary | ICD-10-CM | POA: Diagnosis not present

## 2021-06-11 DIAGNOSIS — N1832 Chronic kidney disease, stage 3b: Secondary | ICD-10-CM | POA: Diagnosis not present

## 2021-06-11 DIAGNOSIS — I700 Atherosclerosis of aorta: Secondary | ICD-10-CM | POA: Diagnosis not present

## 2021-06-11 DIAGNOSIS — D649 Anemia, unspecified: Secondary | ICD-10-CM | POA: Diagnosis not present

## 2021-06-11 DIAGNOSIS — Z01818 Encounter for other preprocedural examination: Secondary | ICD-10-CM | POA: Diagnosis not present

## 2021-06-11 DIAGNOSIS — I471 Supraventricular tachycardia: Secondary | ICD-10-CM | POA: Diagnosis not present

## 2021-06-11 DIAGNOSIS — I129 Hypertensive chronic kidney disease with stage 1 through stage 4 chronic kidney disease, or unspecified chronic kidney disease: Secondary | ICD-10-CM | POA: Diagnosis not present

## 2021-06-15 NOTE — Progress Notes (Signed)
?Cardiology Office Note:   ? ?Date:  06/16/2021  ? ?ID:  Gerald Valdez, DOB 08-30-1928, MRN [id number] ? ?PCP:  Prince Solian, MD ?  ?[facility] Providers ?Cardiologist:  Mertie Moores, MD    ? ?Referring MD: Geoffery Lyons, NP  ? ?Chief Complaint: preoperative cardiac evaluation ? ?History of Present Illness:   ? ?Gerald Valdez is a very pleasant 88 y.o. male with a hx of  ? ?Coronary artery calcification  ?Bladder CA ?COPD/emphysema on home O2 ?Hypertension  ?Hyperlipidemia  ?Peptic Ulcer Disease ?Aortic atherosclerosis  ?GERD  ? ?Was referred by PCP for evaluation of coronary artery calcifications and seen by Dr. Acie Fredrickson on 03/04/20. He had recently been to [facility] ED for increasing dyspnea which was treated as COPD exacerbation. Chest CT revealed 3 vessel coronary calcification. Echo 02/11/20 revealed normal LVEF 55-60%, G1 DD. On home O2. Not having any symptoms of angina at office visit and no recommendation for further evaluation of ischemia. He was advised to follow-up in 6 months.  ? ?He was last seen in our office on 09/08/20 by Richardson Dopp, PA at which time he was feeling well. PCP had changed statin to rosuvastatin for LDL goal < 70. Patient not on ASA due to hx of duodenal ulcer.  ? ?Today, he is here with his wife for evaluation for upcoming bladder resection.  He reports he has been feeling well.  He is physically active around home and in the yard but requires oxygen at 3 L with the exception of very brief periods of time walking a short distance. He denies chest pain, shortness of breath, lower extremity edema, fatigue, palpitations, melena, hematuria, hemoptysis, diaphoresis, weakness, presyncope, syncope, orthopnea, and PND. According to the Revised Cardiac Risk Index (RCRI), his Perioperative Risk of Major Cardiac Event is (%): 0.4 ?His Functional Capacity in METs is: 5.5 according to the Duke Activity Status Index (DASI). ? ? ?Past Medical History:  ?Diagnosis Date  ? Acute duodenal ulcer with  bleeding 01/30/2014  ? Acute post-hemorrhagic anemia 01/30/2014  ? Anxiety   ? Aortic atherosclerosis ([place]) 02/10/2020  ? Arthritis   ? Cancer [facility])   ? bladder  ? Chronic kidney disease   ? COPD exacerbation ([place]) 02/10/2020  ? Coronary artery disease   ? Dyspnea   ? Dyspnea on exertion 02/10/2020  ? Emphysema of lung ([place])   ? Essential hypertension 02/10/2020  ? GERD (gastroesophageal reflux disease) 02/10/2020  ? GI bleed 01/30/2014  ? Hyperlipidemia 02/10/2020  ? ? ?Past Surgical History:  ?Procedure Laterality Date  ? BLADDER SURGERY    ? CYSTOSCOPY W/ RETROGRADES Bilateral 11/20/2020  ? Procedure: CYSTOSCOPY WITH RETROGRADE PYELOGRAM;  Surgeon: Franchot Gallo, MD;  [facility];  Service: Urology;  Laterality: Bilateral;  ? CYSTOSCOPY W/ RETROGRADES Right 02/12/2021  ? Procedure: CYSTOSCOPY WITH RETROGRADE PYELOGRAM;  Surgeon: Franchot Gallo, MD;  [facility];  Service: Urology;  Laterality: Right;  ? ESOPHAGOGASTRODUODENOSCOPY N/A 01/30/2014  ? Procedure: ESOPHAGOGASTRODUODENOSCOPY (EGD);  Surgeon: Inda Castle, MD;  Location: [name];  Service: Endoscopy;  Laterality: N/A;  ? TONSILLECTOMY    ? TRANSURETHRAL RESECTION OF BLADDER TUMOR N/A 02/12/2021  ? Procedure: REPEAT TRANSURETHRAL RESECTION OF BLADDER TUMOR (TURBT);  Surgeon: Franchot Gallo, MD;  [facility];  Service: Urology;  Laterality: N/A;  ? TRANSURETHRAL RESECTION OF BLADDER TUMOR WITH MITOMYCIN-C N/A 11/20/2020  ? Procedure: TRANSURETHRAL RESECTION OF BLADDER TUMOR;  Surgeon: Franchot Gallo, MD;  [facility];  Service: Urology;  Laterality:  N/A;  ? ? ?Current Medications: ?Current Meds  ?Medication Sig  ? amLODipine (NORVASC) 5 MG tablet Take 5 mg by mouth daily with breakfast.  ? escitalopram (LEXAPRO) 10 MG tablet Take 10 mg by mouth daily.  ? loratadine (CLARITIN) 10 MG tablet Take 10 mg by mouth daily.  ? Multiple Vitamin (MULTIVITAMIN WITH MINERALS) TABS tablet Take 1 tablet by mouth daily with  breakfast.  ? OXYGEN Inhale 3 L into the lungs continuous.  ? pantoprazole (PROTONIX) 40 MG tablet Take 1 tablet (40 mg total) by mouth 2 (two) times daily.  ? ramipril (ALTACE) 10 MG capsule Take 10 mg by mouth daily with breakfast.  ? rosuvastatin (CRESTOR) 20 MG tablet Take 20 mg by mouth at bedtime.  ?  ? ?Allergies:   Patient has no known allergies.  ? ?Social History  ? ?Socioeconomic History  ? Marital status: Married  ?  Spouse name: Not on file  ? Number of children: Not on file  ? Years of education: Not on file  ? Highest education level: Not on file  ?Occupational History  ? Occupation: retired  ?Tobacco Use  ? Smoking status: Former  ?  Years: 47.00  ?  Types: Cigarettes  ?  Start date: 73  ?  Quit date: 31  ?  Years since quitting: 29.3  ? Smokeless tobacco: Never  ?Vaping Use  ? Vaping Use: Never used  ?Substance and Sexual Activity  ? Alcohol use: Not Currently  ? Drug use: Never  ? Sexual activity: Not Currently  ?Other Topics Concern  ? Not on file  ?Social History Narrative  ? Not on file  ? ?Social Determinants of Health  ? ?Financial Resource Strain: Not on file  ?Food Insecurity: Not on file  ?Transportation Needs: Not on file  ?Physical Activity: Not on file  ?Stress: Not on file  ?Social Connections: Not on file  ?  ? ?Family History: ?The patient's family history includes Stroke in his father. ? ?ROS:   ?Please see the history of present illness. All other systems reviewed and are negative. ? ?Labs/Other Studies Reviewed:   ? ?The following studies were reviewed today: ? ?Prior CV studies: ?Echocardiogram 02/11/2020 ?EF 55-60, no RWMA, GR 1 DD, normal RVSF, calcified aortic valve with 1 cm x 0.8 cm calcified mobile density in the LVOT between the aortic valve and anterior mitral valve leaflet, no aortic stenosis ?  ?Chest CTA 02/10/20  ?IMPRESSION: ?1. No evidence of pulmonary embolism. ?2. No acute findings in the thorax to account for the patient's ?symptoms. ?3. Healing fracture of the  anterolateral aspect of the right sixth ?rib. ?4. Diffuse bronchial wall thickening with mild to moderate ?centrilobular and paraseptal emphysema; imaging findings suggestive ?of underlying COPD. ?5. Aortic atherosclerosis, in addition to left main and 3 vessel ?coronary artery disease. ?6. There are calcifications of the aortic valve. Echocardiographic ?correlation for evaluation of potential valvular dysfunction may be ?warranted if clinically indicated. ?  ?Aortic Atherosclerosis (ICD10-I70.0) and Emphysema (ICD10-J43.9). ?Recent Labs: ?01/19/2021: BUN 24; Creatinine, Ser 1.65; Hemoglobin 10.4; Platelets 220; Potassium 4.8; Sodium 137  ?Recent Lipid Panel ?   ?Component Value Date/Time  ? CHOL 158 02/10/2020 1717  ? TRIG 64 02/10/2020 1717  ? HDL 65 02/10/2020 1717  ? CHOLHDL 2.4 02/10/2020 1717  ? VLDL 13 02/10/2020 1717  ? [place] 80 02/10/2020 1717  ? ? ? ?Risk Assessment/Calculations:   ?  ? ?Physical Exam:   ? ?VS:  BP 140/68  Pulse 84   Ht '5\' 7"'$  (1.702 m)   Wt 139 lb 3.2 oz (63.1 kg)   SpO2 95%   BMI 21.80 kg/m?    ? ?Wt Readings from Last 3 Encounters:  ?06/16/21 139 lb 3.2 oz (63.1 kg)  ?02/12/21 149 lb (67.6 kg)  ?11/20/20 134 lb 7.7 oz (61 kg)  ?  ? ?GEN: Well nourished, well developed in no acute distress ?HEENT: Normal ?NECK: No JVD; No carotid bruits ?CARDIAC: RRR, no murmurs, rubs, gallops ?RESPIRATORY:  Diminished breath sounds bilterally without rales, wheezing or rhonchi  ?ABDOMEN: Soft, non-tender, non-distended ?MUSCULOSKELETAL:  Mild, non-pitting bilateral LE edema; No deformity. 2+ pedal pulses, equal bilaterally ?SKIN: Warm and dry ?NEUROLOGIC:  Alert and oriented x 3 ?PSYCHIATRIC:  Normal affect  ? ?EKG:  EKG is ordered today.  The ekg ordered today demonstrates NSR at 84 bpm, nonspecific ST abnormality, no acute change from previous ? ?Diagnoses:   ? ?1. Pre-operative clearance   ?2. Coronary artery calcification seen on CT scan   ?3. Aortic atherosclerosis ([place])   ?4. Essential  hypertension   ? ?Assessment and Plan:   ? ? ?Preoperative cardiac evaluation: He is able to achieve > 4 METS activity, however his physical activity is limited by oxygen dependence. He denies chest pain with activity. Ri

## 2021-06-16 ENCOUNTER — Encounter: Payer: Self-pay | Admitting: Nurse Practitioner

## 2021-06-16 ENCOUNTER — Ambulatory Visit (INDEPENDENT_AMBULATORY_CARE_PROVIDER_SITE_OTHER): Payer: Medicare Other | Admitting: Nurse Practitioner

## 2021-06-16 VITALS — BP 140/68 | HR 84 | Ht 67.0 in | Wt 139.2 lb

## 2021-06-16 DIAGNOSIS — I10 Essential (primary) hypertension: Secondary | ICD-10-CM

## 2021-06-16 DIAGNOSIS — Z01818 Encounter for other preprocedural examination: Secondary | ICD-10-CM

## 2021-06-16 DIAGNOSIS — I2510 Atherosclerotic heart disease of native coronary artery without angina pectoris: Secondary | ICD-10-CM | POA: Diagnosis not present

## 2021-06-16 DIAGNOSIS — I700 Atherosclerosis of aorta: Secondary | ICD-10-CM | POA: Diagnosis not present

## 2021-06-16 NOTE — Patient Instructions (Addendum)
Medication Instructions:  ? ?Your physician recommends that you continue on your current medications as directed. Please refer to the Current Medication list given to you today. ? ? ?*If you need a refill on your cardiac medications before your next appointment, please call your pharmacy* ? ? ?Lab Work: ? ?None ordered.  ? ?If you have labs (blood work) drawn today and your tests are completely normal, you will receive your results only by: ?MyChart Message (if you have MyChart) OR ?A paper copy in the mail ?If you have any lab test that is abnormal or we need to change your treatment, we will call you to review the results. ? ? ?Testing/Procedures: ? ?None ordered. ? ? ?Follow-Up: ?At [facility], you and your health needs are our priority.  As part of our continuing mission to provide you with exceptional heart care, we have created designated Provider Care Teams.  These Care Teams include your primary Cardiologist (physician) and Advanced Practice Providers (APPs -  Physician Assistants and Nurse Practitioners) who all work together to provide you with the care you need, when you need it. ? ?We recommend signing up for the patient portal called "MyChart".  Sign up information is provided on this After Visit Summary.  MyChart is used to connect with patients for Virtual Visits (Telemedicine).  Patients are able to view lab/test results, encounter notes, upcoming appointments, etc.  Non-urgent messages can be sent to your provider as well.   ?To learn more about what you can do with MyChart, go to [email or web address].   ? ?Your next appointment:   ?1 year(s) ? ?The format for your next appointment:   ?In Person ? ?Provider:   ?Mertie Moores, MD   ? ? ?Other Instructions ? ?Your physician wants you to follow-up in: 1 year with Dr. Cathie Olden. You will receive a reminder letter in the mail two months in advance. If you don't receive a letter, please call our office to schedule the follow-up appointment. ? ?HOW TO  TAKE YOUR BLOOD PRESSURE: ?Rest 5 minutes before taking your blood pressure. ? Don?t smoke or drink caffeinated beverages for at least 30 minutes before. ?Take your blood pressure before (not after) you eat. ?Sit comfortably with your back supported and both feet on the floor (don?t cross your legs). ?Elevate your arm to heart level on a table or a desk. ?Use the proper sized cuff. It should fit smoothly and snugly around your bare upper arm. There should be enough room to slip a fingertip under the cuff. The bottom edge of the cuff should be 1 inch above the crease of the elbow.  ?If your BP stays < 140/80 consistently X 3 please call office.   ? ? ?Important Information About Sugar ? ? ? ? ?  ?

## 2021-06-17 NOTE — Patient Instructions (Signed)
DUE TO COVID-19 ONLY TWO VISITORS  (aged 86 and older)  ARE ALLOWED TO COME WITH YOU AND STAY IN THE WAITING ROOM ONLY DURING PRE OP AND PROCEDURE.   ?**NO VISITORS ARE ALLOWED IN THE SHORT STAY AREA OR RECOVERY ROOM!!** ? ?IF YOU WILL BE ADMITTED INTO THE HOSPITAL YOU ARE ALLOWED ONLY FOUR SUPPORT PEOPLE DURING VISITATION HOURS ONLY (7 AM -8PM)   ?The support person(s) must pass our screening, gel in and out, and wear a mask at all times, including in the patient?s room. ?Patients must also wear a mask when staff or their support person are in the room. ?Visitors GUEST BADGE MUST BE WORN VISIBLY  ?One adult visitor may remain with you overnight and MUST be in the room by 8 P.M. ?  ? ? Your procedure is scheduled on: 06/25/21 ? ? Report to [facility] Main Entrance ? ?  Report to admitting at : 9:15 AM ? ? Call this number if you have problems the morning of surgery [phone] ? ? Do not eat food :After Midnight. ? ? After Midnight you may have the following liquids until : 8:30 AM  DAY OF SURGERY ? ?Water ?Black Coffee (sugar ok, NO MILK/CREAM OR CREAMERS)  ?Tea (sugar ok, NO MILK/CREAM OR CREAMERS) regular and decaf                             ?Plain Jell-O (NO RED)                                           ?Fruit ices (not with fruit pulp, NO RED)                                     ?Popsicles (NO RED)                                                                  ?Juice: apple, WHITE grape, WHITE cranberry ?Sports drinks like Gatorade (NO RED) ?Clear broth(vegetable,chicken,beef) ? ?FOLLOW BOWEL PREP AND ANY ADDITIONAL PRE OP INSTRUCTIONS YOU RECEIVED FROM YOUR SURGEON'S OFFICE!!! ?  ?Oral Hygiene is also important to reduce your risk of infection.                                    ?Remember - BRUSH YOUR TEETH THE MORNING OF SURGERY WITH YOUR REGULAR TOOTHPASTE ? ? Do NOT smoke after Midnight ? ? Take these medicines the morning of surgery with A SIP OF WATER:  escitalopram,loratadine,amlodipine,pantoprazole. ? ?DO NOT TAKE ANY ORAL DIABETIC MEDICATIONS DAY OF YOUR SURGERY ? ?Bring CPAP mask and tubing day of surgery. ?                  ?           You may not have any metal on your body including hair pins, jewelry, and body piercing ? ?  Do not wear lotions, powders, perfumes/cologne, or deodorant ? ?            Men may shave face and neck. ? ? Do not bring valuables to the hospital. [place] NOT ?            RESPONSIBLE   FOR VALUABLES. ? ? Contacts, dentures or bridgework may not be worn into surgery. ? ? Bring small overnight bag day of surgery. ?  ? Patients discharged on the day of surgery will not be allowed to drive home.  Someone NEEDS to stay with you for the first 24 hours after anesthesia. ? ? Special Instructions: Bring a copy of your healthcare power of attorney and living will documents         the day of surgery if you haven't scanned them before. ? ?            Please read over the following fact sheets you were given: IF [place] [phone] ? ?   [place] - Preparing for Surgery ?Before surgery, you can play an important role.  Because skin is not sterile, your skin needs to be as free of germs as possible.  You can reduce the number of germs on your skin by washing with CHG (chlorahexidine gluconate) soap before surgery.  CHG is an antiseptic cleaner which kills germs and bonds with the skin to continue killing germs even after washing. ?Please DO NOT use if you have an allergy to CHG or antibacterial soaps.  If your skin becomes reddened/irritated stop using the CHG and inform your nurse when you arrive at Short Stay. ?Do not shave (including legs and underarms) for at least 48 hours prior to the first CHG shower.  You may shave your face/neck. ?Please follow these instructions carefully: ? 1.  Shower with CHG Soap the night before surgery and the  morning of Surgery. ? 2.  If you  choose to wash your hair, wash your hair first as usual with your  normal  shampoo. ? 3.  After you shampoo, rinse your hair and body thoroughly to remove the  shampoo.                           4.  Use CHG as you would any other liquid soap.  You can apply chg directly  to the skin and wash  ?                     Gently with a scrungie or clean washcloth. ? 5.  Apply the CHG Soap to your body ONLY FROM THE NECK DOWN.   Do not use on face/ open      ?                     Wound or open sores. Avoid contact with eyes, ears mouth and genitals (private parts).  ?                     Production manager,  Genitals (private parts) with your normal soap. ?            6.  Wash thoroughly, paying special attention to the area where your surgery  will be performed. ? 7.  Thoroughly rinse your body with warm water from the neck down. ? 8.  DO NOT shower/wash with your normal soap  after using and rinsing off  the CHG Soap. ?               9.  Pat yourself dry with a clean towel. ?           10.  Wear clean pajamas. ?           11.  Place clean sheets on your bed the night of your first shower and do not  sleep with pets. ?Day of Surgery : ?Do not apply any lotions/deodorants the morning of surgery.  Please wear clean clothes to the hospital/surgery center. ? ?FAILURE TO FOLLOW THESE INSTRUCTIONS MAY RESULT IN THE CANCELLATION OF YOUR SURGERY ?PATIENT SIGNATURE_________________________________ ? ?NURSE SIGNATURE__________________________________ ? ?________________________________________________________________________  ?

## 2021-06-19 ENCOUNTER — Encounter (HOSPITAL_COMMUNITY): Payer: Self-pay

## 2021-06-19 ENCOUNTER — Encounter (HOSPITAL_COMMUNITY)
Admission: RE | Admit: 2021-06-19 | Discharge: 2021-06-19 | Disposition: A | Discharge status: Home / Self Care | Payer: Medicare Other | Source: Ambulatory Visit | Attending: Urology | Admitting: Urology

## 2021-06-19 VITALS — BP 115/64 | Temp 97.8°F | Ht 67.0 in | Wt 137.0 lb

## 2021-06-19 DIAGNOSIS — I10 Essential (primary) hypertension: Secondary | ICD-10-CM | POA: Insufficient documentation

## 2021-06-19 DIAGNOSIS — I129 Hypertensive chronic kidney disease with stage 1 through stage 4 chronic kidney disease, or unspecified chronic kidney disease: Secondary | ICD-10-CM | POA: Diagnosis not present

## 2021-06-19 DIAGNOSIS — Z01812 Encounter for preprocedural laboratory examination: Secondary | ICD-10-CM | POA: Insufficient documentation

## 2021-06-19 DIAGNOSIS — C679 Malignant neoplasm of bladder, unspecified: Secondary | ICD-10-CM | POA: Insufficient documentation

## 2021-06-19 DIAGNOSIS — I2510 Atherosclerotic heart disease of native coronary artery without angina pectoris: Secondary | ICD-10-CM | POA: Diagnosis not present

## 2021-06-19 DIAGNOSIS — N189 Chronic kidney disease, unspecified: Secondary | ICD-10-CM | POA: Insufficient documentation

## 2021-06-19 DIAGNOSIS — Z01818 Encounter for other preprocedural examination: Secondary | ICD-10-CM

## 2021-06-19 DIAGNOSIS — K219 Gastro-esophageal reflux disease without esophagitis: Secondary | ICD-10-CM | POA: Insufficient documentation

## 2021-06-19 LAB — BASIC METABOLIC PANEL
Anion gap: 8 (ref 5–15)
BUN: 22 mg/dL (ref 8–23)
CO2: 33 mmol/L — ABNORMAL HIGH (ref 22–32)
Calcium: 8.9 mg/dL (ref 8.9–10.3)
Chloride: 96 mmol/L — ABNORMAL LOW (ref 98–111)
Creatinine, Ser: 1.4 mg/dL — ABNORMAL HIGH (ref 0.61–1.24)
GFR, Estimated: 48 mL/min — ABNORMAL LOW (ref >60)
Glucose, Bld: 98 mg/dL (ref 70–99)
Potassium: 5 mmol/L (ref 3.5–5.1)
Sodium: 137 mmol/L (ref 135–145)

## 2021-06-19 LAB — CBC
HCT: 31.4 % — ABNORMAL LOW (ref 39.0–52.0)
Hemoglobin: 9.5 g/dL — ABNORMAL LOW (ref 13.0–17.0)
MCH: 27.1 pg (ref 26.0–34.0)
MCHC: 30.3 g/dL (ref 30.0–36.0)
MCV: 89.5 fL (ref 80.0–100.0)
Platelets: 221 10*3/uL (ref 150–400)
RBC: 3.51 MIL/uL — ABNORMAL LOW (ref 4.22–5.81)
RDW: 14.8 % (ref 11.5–15.5)
WBC: 8.6 10*3/uL (ref 4.0–10.5)
nRBC: 0 % (ref 0.0–0.2)

## 2021-06-19 NOTE — Progress Notes (Signed)
Anesthesia Chart Review ? ? Case: [id number] Date/Time: 06/25/21 1115  ? Procedures:  ?    TRANSURETHRAL RESECTION OF BLADDER TUMOR WITH GEMCITABINE - 1 HR ?    CYSTOSCOPY WITH RETROGRADE PYELOGRAM (Bilateral)  ? Anesthesia type: Choice  ? Pre-op diagnosis: RECURRENT BLADDER CANCER  ? [facility]  ? Surgeons: Franchot Gallo, MD  ? ?  ? ? ?DISCUSSION:88 y.o. former smoker with h/o GERD, HTN, COPD, CAD, CKD, recurrent bladder cancer scheduled for above procedure 06/25/2021 with Dr. Franchot Gallo.  ? ?Pt seen by cardiology 06/16/2021. Per OV note, "Preoperative cardiac evaluation: He is able to achieve > 4 METS activity, however his physical activity is limited by oxygen dependence. He denies chest pain with activity. Risk for MACE is low at 0.4%." ? ?Anticipate pt can proceed with planned procedure barring acute status change.   ?VS: BP 115/64   Temp 36.6 ?C   Ht '5\' 7"'$  (1.702 m)   Wt 62.1 kg   SpO2 100% Comment: 3 L O2  BMI 21.46 kg/m?  ? ?PROVIDERS: ?Avva, Ravisankar, MD is PCP  ? ?Cardiologist:  Mertie Moores, MD    ?LABS: Labs reviewed: Acceptable for surgery. ?(all labs ordered are listed, but only abnormal results are displayed) ? ?Labs Reviewed  ?CBC - Abnormal; Notable for the following components:  ?    Result Value  ? RBC 3.51 (*)   ? Hemoglobin 9.5 (*)   ? HCT 31.4 (*)   ? All other components within normal limits  ?BASIC METABOLIC PANEL - Abnormal; Notable for the following components:  ? Chloride 96 (*)   ? CO2 33 (*)   ? Creatinine, Ser 1.40 (*)   ? GFR, Estimated 48 (*)   ? All other components within normal limits  ? ? ? ?IMAGES: ? ? ?EKG: ?06/16/2021 ?Rate 84 bpm  ?NSR ?Nonspecific ST abnormality  ?No acute changes from previous  ? ?CV: ?Echo 02/11/20 ?1. Left ventricular ejection fraction, by estimation, is 55 to 60%. The  ?left ventricle has normal function. The left ventricle has no regional  ?wall motion abnormalities. Left ventricular diastolic parameters are   ?consistent with Grade I diastolic  ?dysfunction (impaired relaxation).  ? 2. Right ventricular systolic function is normal. The right ventricular  ?size is normal.  ? 3. The mitral valve is normal in structure. No evidence of mitral valve  ?regurgitation. No evidence of mitral stenosis.  ? 4. There is a 1 cm x 0.8 cm calcified, mobile density in the LVOT between  ?the aortic valve and anterior mitral valve leaflet. Consider TEE to better  ?evaluate. The aortic valve is calcified. There is moderate calcification  ?of the aortic valve. There is  ?moderate thickening of the aortic valve. Aortic valve regurgitation is not  ?visualized. No aortic stenosis is present.  ? 5. The inferior vena cava is normal in size with greater than 50%  ?respiratory variability, suggesting right atrial pressure of 3 mmHg.  ?Past Medical History:  ?Diagnosis Date  ? Acute duodenal ulcer with bleeding 01/30/2014  ? Acute post-hemorrhagic anemia 01/30/2014  ? Anxiety   ? Aortic atherosclerosis ([place]) 02/10/2020  ? Arthritis   ? Cancer [facility])   ? bladder  ? Chronic kidney disease   ? COPD exacerbation ([place]) 02/10/2020  ? Coronary artery disease   ? Dyspnea   ? Dyspnea on exertion 02/10/2020  ? Emphysema of lung ([place])   ? Essential hypertension 02/10/2020  ? GERD (gastroesophageal reflux disease)  02/10/2020  ? GI bleed 01/30/2014  ? Hyperlipidemia 02/10/2020  ? ? ?Past Surgical History:  ?Procedure Laterality Date  ? BLADDER SURGERY    ? CYSTOSCOPY W/ RETROGRADES Bilateral 11/20/2020  ? Procedure: CYSTOSCOPY WITH RETROGRADE PYELOGRAM;  Surgeon: Franchot Gallo, MD;  [facility];  Service: Urology;  Laterality: Bilateral;  ? CYSTOSCOPY W/ RETROGRADES Right 02/12/2021  ? Procedure: CYSTOSCOPY WITH RETROGRADE PYELOGRAM;  Surgeon: Franchot Gallo, MD;  [facility];  Service: Urology;  Laterality: Right;  ? ESOPHAGOGASTRODUODENOSCOPY N/A 01/30/2014  ? Procedure: ESOPHAGOGASTRODUODENOSCOPY (EGD);  Surgeon: Inda Castle, MD;   Location: [name];  Service: Endoscopy;  Laterality: N/A;  ? TONSILLECTOMY    ? TRANSURETHRAL RESECTION OF BLADDER TUMOR N/A 02/12/2021  ? Procedure: REPEAT TRANSURETHRAL RESECTION OF BLADDER TUMOR (TURBT);  Surgeon: Franchot Gallo, MD;  [facility];  Service: Urology;  Laterality: N/A;  ? TRANSURETHRAL RESECTION OF BLADDER TUMOR WITH MITOMYCIN-C N/A 11/20/2020  ? Procedure: TRANSURETHRAL RESECTION OF BLADDER TUMOR;  Surgeon: Franchot Gallo, MD;  [facility];  Service: Urology;  Laterality: N/A;  ? ? ?MEDICATIONS: ? amLODipine (NORVASC) 5 MG tablet  ? escitalopram (LEXAPRO) 10 MG tablet  ? loratadine (CLARITIN) 10 MG tablet  ? Multiple Vitamin (MULTIVITAMIN WITH MINERALS) TABS tablet  ? OXYGEN  ? pantoprazole (PROTONIX) 40 MG tablet  ? ramipril (ALTACE) 10 MG capsule  ? rosuvastatin (CRESTOR) 20 MG tablet  ? ?No current facility-administered medications for this encounter.  ? ? gemcitabine (GEMZAR) chemo syringe for bladder instillation 2,000 mg  ? ?Konrad Felix Ward, PA-C ?[facility] ?[phone] ? ? ? ? ? ? ? ?

## 2021-06-19 NOTE — Anesthesia Preprocedure Evaluation (Addendum)
Anesthesia Evaluation  Patient identified by MRN, date of birth, ID band Patient awake    Reviewed: Allergy & Precautions, NPO status , Patient's Chart, lab work & pertinent test results  Airway Mallampati: II  TM Distance: >3 FB Neck ROM: Full    Dental no notable dental hx. (+) Edentulous Upper, Missing, Chipped, Dental Advisory Given,    Pulmonary COPD,  oxygen dependent, former smoker,    Pulmonary exam normal breath sounds clear to auscultation       Cardiovascular hypertension, Pt. on medications + CAD  Normal cardiovascular exam Rhythm:Regular Rate:Normal  02/2020 TTE   Neuro/Psych    GI/Hepatic GERD  ,  Endo/Other    Renal/GU Renal diseaseLab Results      Component                Value               Date                      CREATININE               1.40 (H)            06/19/2021              K                        5.0                 06/19/2021                  Bladder dysfunction  Bladder CA    Musculoskeletal  (+) Arthritis ,   Abdominal   Peds  Hematology  (+) Blood dyscrasia, anemia , Lab Results      Component                Value               Date                      HGB                      9.5 (L)             06/19/2021                HCT                      31.4 (L)            06/19/2021               PLT                      221                 06/19/2021              Anesthesia Other Findings   Reproductive/Obstetrics                           Anesthesia Physical Anesthesia Plan  ASA: 4  Anesthesia Plan: General   Post-op Pain Management: Ofirmev IV (intra-op)*   Induction: Intravenous  PONV Risk Score and Plan: 3 and Treatment may vary due to age or medical condition and Ondansetron  Airway Management  Planned: LMA  Additional Equipment: None  Intra-op Plan:   Post-operative Plan:   Informed Consent: I have reviewed the patients History and  Physical, chart, labs and discussed the procedure including the risks, benefits and alternatives for the proposed anesthesia with the patient or authorized representative who has indicated his/her understanding and acceptance.     Dental advisory given  Plan Discussed with: CRNA  Anesthesia Plan Comments: (See PAT note 06/19/2021)      Anesthesia Quick Evaluation

## 2021-06-19 NOTE — Progress Notes (Signed)
For Short Stay: ?[place] appointment date: ?Date of COVID positive in last 90 days: ? ?Bowel Prep reminder: ? ? ?For Anesthesia: ?PCP - dr. Prince Solian ?Cardiologist - Dr. Mertie Moores. Clearance: Christen Bame: NP: 06/16/21: EPIC ? ?Chest x-ray -  ?EKG - 06/16/21 ?Stress Test -  ?ECHO - 02/11/20 ?Cardiac Cath -  ?Pacemaker/ICD device last checked: ?Pacemaker orders received: ?Device Rep notified: ? ?Spinal Cord Stimulator: ? ?Sleep Study -  ?CPAP -  ? ?Fasting Blood Sugar -  ?Checks Blood Sugar _____ times a day ?Date and result of last Hgb A1c- ? ?Blood Thinner Instructions: ?Aspirin Instructions: ?Last Dose: ? ?Activity level: Can go up a flight of stairs and activities of daily living without stopping and without chest pain and/or shortness of breath ?  Able to exercise without chest pain and/or shortness of breath ?  Unable to go up a flight of stairs without chest pain and/or shortness of breath ?   ? ?Anesthesia review: Hx: COPD,CAD,Emphysema,HTN,O2 dependent: 3L. ? ?Patient denies shortness of breath, fever, cough and chest pain at PAT appointment ? ? ?Patient verbalized understanding of instructions that were given to them at the PAT appointment. Patient was also instructed that they will need to review over the PAT instructions again at home before surgery.  ?

## 2021-06-19 NOTE — Progress Notes (Signed)
LAB. RESULTS: Hemoglobin: 9.5 ?

## 2021-06-22 ENCOUNTER — Telehealth: Payer: Self-pay | Admitting: Cardiovascular Disease

## 2021-06-22 NOTE — Telephone Encounter (Signed)
? ?  Pre-operative Risk Assessment  ?  ?Patient Name: Gerald Valdez  ?DOB: 1928-07-19 ?MRN: [id number]  ? ?  ? ?Request for Surgical Clearance   ? ?Procedure:   TRANSURETHRAL RESECTION OF BLADDER TUMOR WITH GEMCITABINE ? ?Date of Surgery:  Clearance 06/25/21                              ?   ?Surgeon:  Dr. Diona Fanti ?Surgeon's Group or Practice Name:  [facility]  ?Phone number:  [phone] ?Fax number:  [phone] ?  ?Type of Clearance Requested:   ?- Medical  ?  ?Type of Anesthesia:  General  ?  ?Additional requests/questions:   ? ?Signed, ?Trilby Drummer   ?06/22/2021, 4:50 PM   ?

## 2021-06-23 NOTE — Telephone Encounter (Signed)
? ?  Patient Name: Gerald Valdez  ?DOB: 29-Apr-1928 ?MRN: [id number] ? ?Primary Cardiologist: Mertie Moores, MD ? ?Chart reviewed as part of pre-operative protocol coverage.  ? ?Patient was recently evaluated in office by Christen Bame, NP on 06/16/2021 for preoperative cardiac evaluation. However, it is not clear whether clearance was finalized and sent to requesting party. ? ?Received request for surgical clearance for TURP with Dr. Diona Fanti of [facility] scheduled for 06/25/2021.  This appears to be the same procedure for which he had an outpatient visit with Christen Bame, NP. ? ?Sharyn Lull, can you please comment on preoperative cardiac evaluation and clearance for this patient based on your recent visit? ? ?Please route your response to P CV DIV PREOP.  ? ?Thank you.  ? ? ?Lenna Sciara, NP ?06/23/2021, 5:06 PM ? ? ?

## 2021-06-24 NOTE — Telephone Encounter (Signed)
Clearance has been faxed to the requesting provider ?

## 2021-06-24 NOTE — H&P (Signed)
H&P ? ?Chief Complaint: Recurrent bladder cancer ? ?History of Present Illness: 88 year old male with history of urothelial carcinoma of the bladder, returns for repeat resection of recurrent disease.  History is as follows: ? ?Original diagnosis of high-grade nonmuscle invasive bladder cancer in 1993. Metastatic survey, chest x-ray, CT of abdomen and pelvis, no evidence of extravesical disease. Eventually given BCG x6 in June 1994, BCG x3 in January 1995, BCG x3 in June 1995. Last negative cysto 10/2006.  ? ?10.13.2022 resection of a large recurrent urothelial carcinoma. Pathology--HG NMIBC, muscle present, not involved.  ? ?1.5.2023: He underwent repeat TURBT. 2 cm posterior wall BT resected.Path--no neoplasia noted.  ? ?1.27.2023: Since his repeat resection, he has been doing well. Some urinary frequency and urgency but no gross hematuria. We elected surveillance w/o BCG Rx.  ? ?4.26.2023: Repeat cystoscopy revealed multiple small tumors scattered throughout the bladder, quite small but the largest tumor at the bladder neck was 25 mm in size. ? ? ? ?Past Medical History:  ?Diagnosis Date  ? Acute duodenal ulcer with bleeding 01/30/2014  ? Acute post-hemorrhagic anemia 01/30/2014  ? Anxiety   ? Aortic atherosclerosis ([place]) 02/10/2020  ? Arthritis   ? Cancer [facility])   ? bladder  ? Chronic kidney disease   ? COPD exacerbation ([place]) 02/10/2020  ? Coronary artery disease   ? Dyspnea   ? Dyspnea on exertion 02/10/2020  ? Emphysema of lung ([place])   ? Essential hypertension 02/10/2020  ? GERD (gastroesophageal reflux disease) 02/10/2020  ? GI bleed 01/30/2014  ? Hyperlipidemia 02/10/2020  ? ? ?Past Surgical History:  ?Procedure Laterality Date  ? BLADDER SURGERY    ? CYSTOSCOPY W/ RETROGRADES Bilateral 11/20/2020  ? Procedure: CYSTOSCOPY WITH RETROGRADE PYELOGRAM;  Surgeon: Franchot Gallo, MD;  Location: [facility];  Service: Urology;  Laterality: Bilateral;  ? CYSTOSCOPY W/ RETROGRADES Right 02/12/2021  ? Procedure:  CYSTOSCOPY WITH RETROGRADE PYELOGRAM;  Surgeon: Franchot Gallo, MD;  Location: [facility];  Service: Urology;  Laterality: Right;  ? ESOPHAGOGASTRODUODENOSCOPY N/A 01/30/2014  ? Procedure: ESOPHAGOGASTRODUODENOSCOPY (EGD);  Surgeon: Inda Castle, MD;  Location: [name];  Service: Endoscopy;  Laterality: N/A;  ? TONSILLECTOMY    ? TRANSURETHRAL RESECTION OF BLADDER TUMOR N/A 02/12/2021  ? Procedure: REPEAT TRANSURETHRAL RESECTION OF BLADDER TUMOR (TURBT);  Surgeon: Franchot Gallo, MD;  Location: [facility];  Service: Urology;  Laterality: N/A;  ? TRANSURETHRAL RESECTION OF BLADDER TUMOR WITH MITOMYCIN-C N/A 11/20/2020  ? Procedure: TRANSURETHRAL RESECTION OF BLADDER TUMOR;  Surgeon: Franchot Gallo, MD;  [facility];  Service: Urology;  Laterality: N/A;  ? ? ?Home Medications:  ?Allergies as of 06/24/2021   ?No Known Allergies ?  ? ?  ?Medication List  ?  ? ? Notice   ?Cannot display discharge medications because the patient has not yet been admitted. ?  ? ? ?Allergies: No Known Allergies ? ?Family History  ?Problem Relation Age of Onset  ? Stroke Father   ? ? ?Social History:  reports that he quit smoking about 29 years ago. His smoking use included cigarettes. He started smoking about 76 years ago. He has never used smokeless tobacco. He reports that he does not currently use alcohol. He reports that he does not use drugs. ? ?ROS: ?A complete review of systems was performed.  All systems are negative except for pertinent findings as noted. ? ?Physical Exam:  ?Vital signs in last 24 hours: ?There were no vitals taken for this visit. ?Constitutional:  Alert and oriented, No acute  distress ?Cardiovascular: Regular rate  ?Respiratory: Normal respiratory effort ?Neurologic: Grossly intact, no focal deficits ?Psychiatric: Normal mood and affect ? ?I have reviewed prior pt notes ? ?I have reviewed urinalysis results ? ?I have reviewed prior pathology results ? ? ?Impression/Assessment:  ?Recurrent high-grade  nonmuscle invasive bladder cancer, multiple areas of recurrence ? ?Plan:  ?Cystoscopy, TURBT, bilateral retrograde pyelograms, placement of gemcitabine ? ?

## 2021-06-25 ENCOUNTER — Encounter (HOSPITAL_COMMUNITY)
Admission: RE | Disposition: A | Discharge status: Home / Self Care | Payer: Self-pay | Source: Home / Self Care | Attending: Urology

## 2021-06-25 ENCOUNTER — Ambulatory Visit (HOSPITAL_COMMUNITY): Payer: Medicare Other

## 2021-06-25 ENCOUNTER — Encounter (HOSPITAL_COMMUNITY): Payer: Self-pay | Admitting: Urology

## 2021-06-25 ENCOUNTER — Ambulatory Visit (HOSPITAL_BASED_OUTPATIENT_CLINIC_OR_DEPARTMENT_OTHER): Payer: Medicare Other | Admitting: Anesthesiology

## 2021-06-25 ENCOUNTER — Ambulatory Visit (HOSPITAL_COMMUNITY): Payer: Medicare Other | Admitting: Physician Assistant

## 2021-06-25 ENCOUNTER — Other Ambulatory Visit: Payer: Self-pay

## 2021-06-25 ENCOUNTER — Observation Stay (HOSPITAL_COMMUNITY)
Admission: RE | Admit: 2021-06-25 | Discharge: 2021-06-26 | Disposition: A | Discharge status: Home / Self Care | Payer: Medicare Other | Attending: Urology | Admitting: Urology

## 2021-06-25 DIAGNOSIS — C679 Malignant neoplasm of bladder, unspecified: Secondary | ICD-10-CM

## 2021-06-25 DIAGNOSIS — I10 Essential (primary) hypertension: Secondary | ICD-10-CM

## 2021-06-25 DIAGNOSIS — C678 Malignant neoplasm of overlapping sites of bladder: Secondary | ICD-10-CM | POA: Diagnosis not present

## 2021-06-25 DIAGNOSIS — J449 Chronic obstructive pulmonary disease, unspecified: Secondary | ICD-10-CM | POA: Insufficient documentation

## 2021-06-25 DIAGNOSIS — Z8551 Personal history of malignant neoplasm of bladder: Secondary | ICD-10-CM | POA: Diagnosis not present

## 2021-06-25 DIAGNOSIS — I129 Hypertensive chronic kidney disease with stage 1 through stage 4 chronic kidney disease, or unspecified chronic kidney disease: Secondary | ICD-10-CM | POA: Insufficient documentation

## 2021-06-25 DIAGNOSIS — I2510 Atherosclerotic heart disease of native coronary artery without angina pectoris: Secondary | ICD-10-CM | POA: Diagnosis not present

## 2021-06-25 DIAGNOSIS — Z87891 Personal history of nicotine dependence: Secondary | ICD-10-CM | POA: Diagnosis not present

## 2021-06-25 DIAGNOSIS — Z01818 Encounter for other preprocedural examination: Secondary | ICD-10-CM

## 2021-06-25 DIAGNOSIS — R339 Retention of urine, unspecified: Secondary | ICD-10-CM | POA: Diagnosis not present

## 2021-06-25 DIAGNOSIS — D630 Anemia in neoplastic disease: Secondary | ICD-10-CM | POA: Diagnosis not present

## 2021-06-25 DIAGNOSIS — N189 Chronic kidney disease, unspecified: Secondary | ICD-10-CM | POA: Insufficient documentation

## 2021-06-25 DIAGNOSIS — D090 Carcinoma in situ of bladder: Secondary | ICD-10-CM | POA: Diagnosis not present

## 2021-06-25 HISTORY — PX: TRANSURETHRAL RESECTION OF BLADDER TUMOR WITH MITOMYCIN-C: SHX6459

## 2021-06-25 HISTORY — PX: CYSTOSCOPY W/ RETROGRADES: SHX1426

## 2021-06-25 SURGERY — TRANSURETHRAL RESECTION OF BLADDER TUMOR WITH MITOMYCIN-C
Anesthesia: General

## 2021-06-25 MED ORDER — ACETAMINOPHEN 10 MG/ML IV SOLN: 1000.0000 mg | Freq: Once | INTRAVENOUS | Status: DC | PRN

## 2021-06-25 MED ORDER — CHLORHEXIDINE GLUCONATE 0.12 % MT SOLN: 15.0000 mL | Freq: Once | OROMUCOSAL | Status: AC

## 2021-06-25 MED ORDER — PHENYLEPHRINE 80 MCG/ML (10ML) SYRINGE FOR IV PUSH (FOR BLOOD PRESSURE SUPPORT)
PREFILLED_SYRINGE | INTRAVENOUS | Status: DC | PRN
  Administered 2021-06-25 (×4): 160 ug via INTRAVENOUS

## 2021-06-25 MED ORDER — DEXAMETHASONE SODIUM PHOSPHATE 10 MG/ML IJ SOLN
INTRAMUSCULAR | Status: DC | PRN
  Administered 2021-06-25: 10 mg via INTRAVENOUS

## 2021-06-25 MED ORDER — CEFAZOLIN SODIUM-DEXTROSE 2-4 GM/100ML-% IV SOLN
2.0000 g | INTRAVENOUS | Status: AC
  Administered 2021-06-25: 2 g via INTRAVENOUS

## 2021-06-25 MED ORDER — LIDOCAINE HCL (PF) 2 % IJ SOLN
INTRAMUSCULAR | Status: AC
  Filled 2021-06-25: qty 5

## 2021-06-25 MED ORDER — ROSUVASTATIN CALCIUM 20 MG PO TABS
20.0000 mg | ORAL_TABLET | Freq: Every day | ORAL | Status: DC
  Administered 2021-06-25: 20 mg via ORAL
  Filled 2021-06-25: qty 1

## 2021-06-25 MED ORDER — GEMCITABINE CHEMO FOR BLADDER INSTILLATION 2000 MG
2000.0000 mg | Freq: Once | INTRAVENOUS | Status: AC
  Administered 2021-06-25: 2000 mg via INTRAVESICAL
  Filled 2021-06-25: qty 2000

## 2021-06-25 MED ORDER — LORATADINE 10 MG PO TABS
10.0000 mg | ORAL_TABLET | Freq: Every day | ORAL | Status: DC
Start: 2021-06-25 — End: 2021-06-26
  Administered 2021-06-25 – 2021-06-26 (×2): 10 mg via ORAL
  Filled 2021-06-25 (×2): qty 1

## 2021-06-25 MED ORDER — DEXAMETHASONE SODIUM PHOSPHATE 10 MG/ML IJ SOLN
INTRAMUSCULAR | Status: AC
  Filled 2021-06-25: qty 1

## 2021-06-25 MED ORDER — ACETAMINOPHEN 325 MG PO TABS: 650.0000 mg | ORAL_TABLET | ORAL | Status: DC | PRN

## 2021-06-25 MED ORDER — SENNA 8.6 MG PO TABS
1.0000 | ORAL_TABLET | Freq: Two times a day (BID) | ORAL | Status: DC
  Administered 2021-06-25 – 2021-06-26 (×3): 8.6 mg via ORAL
  Filled 2021-06-25 (×3): qty 1

## 2021-06-25 MED ORDER — LIDOCAINE 2% (20 MG/ML) 5 ML SYRINGE
INTRAMUSCULAR | Status: DC | PRN
  Administered 2021-06-25: 80 mg via INTRAVENOUS

## 2021-06-25 MED ORDER — ORAL CARE MOUTH RINSE
15.0000 mL | Freq: Once | OROMUCOSAL | Status: AC
  Administered 2021-06-25: 15 mL via OROMUCOSAL

## 2021-06-25 MED ORDER — ONDANSETRON HCL 4 MG/2ML IJ SOLN
INTRAMUSCULAR | Status: AC
  Filled 2021-06-25: qty 2

## 2021-06-25 MED ORDER — RAMIPRIL 10 MG PO CAPS
10.0000 mg | ORAL_CAPSULE | Freq: Every day | ORAL | Status: DC
Start: 2021-06-26 — End: 2021-06-26
  Administered 2021-06-26: 10 mg via ORAL
  Filled 2021-06-25: qty 1

## 2021-06-25 MED ORDER — CEFAZOLIN SODIUM-DEXTROSE 2-4 GM/100ML-% IV SOLN
INTRAVENOUS | Status: AC
  Filled 2021-06-25: qty 100

## 2021-06-25 MED ORDER — CEPHALEXIN 500 MG PO CAPS
500.0000 mg | ORAL_CAPSULE | Freq: Two times a day (BID) | ORAL | Status: DC
  Administered 2021-06-25 – 2021-06-26 (×2): 500 mg via ORAL
  Filled 2021-06-25 (×2): qty 1

## 2021-06-25 MED ORDER — ONDANSETRON HCL 4 MG/2ML IJ SOLN
INTRAMUSCULAR | Status: DC | PRN
  Administered 2021-06-25: 4 mg via INTRAVENOUS

## 2021-06-25 MED ORDER — PROPOFOL 10 MG/ML IV BOLUS
INTRAVENOUS | Status: DC | PRN
  Administered 2021-06-25: 80 mg via INTRAVENOUS

## 2021-06-25 MED ORDER — LACTATED RINGERS IV SOLN: INTRAVENOUS | Status: DC

## 2021-06-25 MED ORDER — FENTANYL CITRATE (PF) 100 MCG/2ML IJ SOLN
INTRAMUSCULAR | Status: AC
  Filled 2021-06-25: qty 2

## 2021-06-25 MED ORDER — FENTANYL CITRATE (PF) 100 MCG/2ML IJ SOLN
INTRAMUSCULAR | Status: DC | PRN
Start: 2021-06-25 — End: 2021-06-25
  Administered 2021-06-25 (×4): 25 ug via INTRAVENOUS

## 2021-06-25 MED ORDER — SODIUM CHLORIDE 0.9 % IR SOLN
Status: DC | PRN
  Administered 2021-06-25: 9000 mL

## 2021-06-25 MED ORDER — AMLODIPINE BESYLATE 5 MG PO TABS
5.0000 mg | ORAL_TABLET | Freq: Every day | ORAL | Status: DC
  Administered 2021-06-26: 5 mg via ORAL
  Filled 2021-06-25: qty 1

## 2021-06-25 MED ORDER — ESCITALOPRAM OXALATE 10 MG PO TABS
10.0000 mg | ORAL_TABLET | Freq: Every day | ORAL | Status: DC
  Administered 2021-06-25 – 2021-06-26 (×2): 10 mg via ORAL
  Filled 2021-06-25 (×2): qty 1

## 2021-06-25 MED ORDER — HYDROCODONE-ACETAMINOPHEN 5-325 MG PO TABS: 1.0000 | ORAL_TABLET | ORAL | Status: DC | PRN

## 2021-06-25 MED ORDER — PROPOFOL 10 MG/ML IV BOLUS
INTRAVENOUS | Status: AC
  Filled 2021-06-25: qty 20

## 2021-06-25 MED ORDER — PANTOPRAZOLE SODIUM 40 MG PO TBEC
40.0000 mg | DELAYED_RELEASE_TABLET | Freq: Every day | ORAL | Status: DC
  Administered 2021-06-26: 40 mg via ORAL
  Filled 2021-06-25: qty 1

## 2021-06-25 MED ORDER — IOHEXOL 300 MG/ML  SOLN
INTRAMUSCULAR | Status: DC | PRN
  Administered 2021-06-25: 20 mL

## 2021-06-25 MED ORDER — FENTANYL CITRATE PF 50 MCG/ML IJ SOSY: 25.0000 ug | PREFILLED_SYRINGE | INTRAMUSCULAR | Status: DC | PRN

## 2021-06-25 MED ORDER — ONDANSETRON HCL 4 MG/2ML IJ SOLN: 4.0000 mg | Freq: Once | INTRAMUSCULAR | Status: DC | PRN

## 2021-06-25 MED ORDER — SODIUM CHLORIDE 0.45 % IV SOLN: INTRAVENOUS | Status: DC

## 2021-06-25 SURGICAL SUPPLY — 24 items
BAG DRN RND TRDRP ANRFLXCHMBR (UROLOGICAL SUPPLIES) ×2
BAG URINE DRAIN 2000ML AR STRL (UROLOGICAL SUPPLIES) ×1 IMPLANT
BAG URO CATCHER STRL LF (MISCELLANEOUS) ×3 IMPLANT
CATH FOLEY 2WAY SLVR  5CC 18FR (CATHETERS) ×3
CATH FOLEY 2WAY SLVR 30CC 24FR (CATHETERS) IMPLANT
CATH FOLEY 2WAY SLVR 5CC 18FR (CATHETERS) IMPLANT
CATH URETL OPEN END 6FR 70 (CATHETERS) ×3 IMPLANT
CLOTH BEACON ORANGE TIMEOUT ST (SAFETY) ×3 IMPLANT
DRAPE FOOT SWITCH (DRAPES) ×3 IMPLANT
EVACUATOR MICROVAS BLADDER (UROLOGICAL SUPPLIES) IMPLANT
GLOVE SURG LX 8.0 MICRO (GLOVE) ×1
GLOVE SURG LX STRL 8.0 MICRO (GLOVE) ×2 IMPLANT
GOWN STRL REUS W/ TWL XL LVL3 (GOWN DISPOSABLE) ×4 IMPLANT
GOWN STRL REUS W/TWL XL LVL3 (GOWN DISPOSABLE) ×3
GUIDEWIRE STR DUAL SENSOR (WIRE) ×2 IMPLANT
KIT TURNOVER KIT A (KITS) IMPLANT
LOOP CUT BIPOLAR 24F LRG (ELECTROSURGICAL) ×3 IMPLANT
MANIFOLD NEPTUNE II (INSTRUMENTS) ×3 IMPLANT
PACK CYSTO (CUSTOM PROCEDURE TRAY) ×3 IMPLANT
SYR TOOMEY IRRIG 70ML (MISCELLANEOUS) ×3
SYRINGE TOOMEY IRRIG 70ML (MISCELLANEOUS) IMPLANT
TUBING CONNECTING 10 (TUBING) ×3 IMPLANT
TUBING UROLOGY SET (TUBING) ×3 IMPLANT
WATER STERILE IRR 3000ML UROMA (IV SOLUTION) ×2 IMPLANT

## 2021-06-25 NOTE — Anesthesia Postprocedure Evaluation (Signed)
Anesthesia Post Note  Patient: Gerald Valdez  Procedure(s) Performed: TRANSURETHRAL RESECTION OF BLADDER TUMOR WITH GEMCITABINE CYSTOSCOPY WITH RETROGRADE PYELOGRAM (Bilateral)     Patient location during evaluation: PACU Anesthesia Type: General Level of consciousness: awake and alert Pain management: pain level controlled Vital Signs Assessment: post-procedure vital signs reviewed and stable Respiratory status: spontaneous breathing, nonlabored ventilation, respiratory function stable and patient connected to nasal cannula oxygen Cardiovascular status: blood pressure returned to baseline and stable Postop Assessment: no apparent nausea or vomiting Anesthetic complications: no   No notable events documented.  Last Vitals:  Vitals:   06/25/21 1400 06/25/21 1415  BP: (!) 148/69 (!) 145/68  Pulse: 97 87  Resp: (!) 26 19  Temp:    SpO2: 100% 100%    Last Pain:  Vitals:   06/25/21 1400  TempSrc:   PainSc: 0-No pain                 Barnet Glasgow

## 2021-06-25 NOTE — Transfer of Care (Signed)
Immediate Anesthesia Transfer of Care Note  Patient: Gerald Valdez  Procedure(s) Performed: TRANSURETHRAL RESECTION OF BLADDER TUMOR WITH GEMCITABINE CYSTOSCOPY WITH RETROGRADE PYELOGRAM (Bilateral)  Patient Location: PACU  Anesthesia Type:General  Level of Consciousness: awake, alert  and oriented  Airway & Oxygen Therapy: Patient Spontanous Breathing and Patient connected to nasal cannula oxygen  Post-op Assessment: Report given to RN and Post -op Vital signs reviewed and stable  Post vital signs: Reviewed and stable  Last Vitals:  Vitals Value Taken Time  BP 110/86 06/25/21 1216  Temp    Pulse 34 06/25/21 1217  Resp 18 06/25/21 1219  SpO2 82 % 06/25/21 1217  Vitals shown include unvalidated device data.  Last Pain:  Vitals:   06/25/21 0944  TempSrc:   PainSc: 0-No pain         Complications: No notable events documented.

## 2021-06-25 NOTE — Interval H&P Note (Signed)
History and Physical Interval Note:  06/25/2021 10:53 AM  Gerald Valdez  has presented today for surgery, with the diagnosis of RECURRENT BLADDER CANCER.  The various methods of treatment have been discussed with the patient and family. After consideration of risks, benefits and other options for treatment, the patient has consented to  Procedure(s) with comments: TRANSURETHRAL RESECTION OF BLADDER TUMOR WITH GEMCITABINE (N/A) - 1 HR CYSTOSCOPY WITH RETROGRADE PYELOGRAM (Bilateral) as a surgical intervention.  The patient's history has been reviewed, patient examined, no change in status, stable for surgery.  I have reviewed the patient's chart and labs.  Questions were answered to the patient's satisfaction.     Lillette Boxer Vivienne Sangiovanni

## 2021-06-25 NOTE — Anesthesia Procedure Notes (Signed)
Procedure Name: LMA Insertion Date/Time: 06/25/2021 11:29 AM Performed by: Chaniqua Brisby D, CRNA Pre-anesthesia Checklist: Patient identified, Emergency Drugs available, Suction available and Patient being monitored Patient Re-evaluated:Patient Re-evaluated prior to induction Oxygen Delivery Method: Circle system utilized Preoxygenation: Pre-oxygenation with 100% oxygen Induction Type: IV induction Ventilation: Mask ventilation without difficulty LMA: LMA with gastric port inserted LMA Size: 4.0 Tube type: Oral Number of attempts: 1 Placement Confirmation: positive ETCO2 and breath sounds checked- equal and bilateral Tube secured with: Tape Dental Injury: Teeth and Oropharynx as per pre-operative assessment

## 2021-06-25 NOTE — Op Note (Signed)
Preoperative diagnosis: Recurrent urothelial carcinoma the bladder, multiple papillary tumors  Postoperative diagnosis: Same  Principal procedure: Cystoscopy, bilateral retrograde ureteropyelogram, fluoroscopic interpretation, TUR BT of multiple bladder tumors (largest diameter 15 mm), placement of intravesical gemcitabine  Surgeon: Dorina Ribaudo  Anesthesia: General with LMA  Specimen: Bladder tumors, to pathology  Estimated blood loss: Less than 25 mL  Complications: None  Indications: 88 year old male with longstanding history of urothelial carcinoma.  Prior to his presentation to me in late 2022, he had had TURBT with subsequent BCG induction/maintenance by Dr. Jacqualin Combes approximately 20 years ago.  On presentation to [facility] in 2022 he had a large papillary tumor, 3 to 4 cm in size, on the right trigonal region.  This was treated with resection, but he has had 2 recurrences since that initial TURBT, 1 in January treated with TURBT and then 1 on his last cystoscopy within the past month.  He presents for repeat resection with gemcitabine.  He does have 20-30 small papillary lesions scattered throughout the bladder with a dominant 15 mm lesion in the right trigonal region.  Findings: Urethra was normal, without stricture or lesion.  Prostate was nonobstructive.  Bladder displayed multiple trabeculations, cellules, and multiple papillary bladder tumors.  Ureteral orifice on the left was normal in configuration location, orifice on the right was golf-hole configuration.  The largest papillary recurrence was just to the lateral side of the right ureteral orifice.  This was 15 mm in size.  Multiple other papillary lesions were present, measuring between 1 and 5 mm in size.  These were mainly in the posterior bladder, on the right and left sidewalls.  However, there were 2-3 in the dome region.  Retrograde study of the left ureter revealed normal ureteral caliber, no filling defects, normal  pyelocalyceal system.  On the right, there was mild ureterectasis and pyelocalyceal dilatation consistent with reflux.  No filling defects were seen.  Description of procedure: The patient was properly identified in the holding area.  Taken to the operating room where general anesthetic was administered with the LMA.  He was placed in the dorsolithotomy position.  Genitalia and perineum were prepped, draped, proper timeout performed.  21 French panendoscope advanced into the bladder.  Circumferential inspection performed.  Bilateral retrograde ureteropyelogram's were performed using 6 [place] open-ended catheter and Omnipaque.  Above-mentioned findings were noted.  There being no ureteral or pyelocalyceal filling defects, no ureteroscopy was performed.  I then passed the 26 French resectoscope sheath using the visual obturator.  Resectoscope and cutting loop were then placed.  Initial resection was the larger bladder tumor, resected into the muscular layer.  The larger remaining tumors were resected with the loop, the smaller lesions were cauterized.  I would say there were at least 30 of these bladder tumors scattered throughout the bladder, most were quite small and easily obliterated with cautery.  Following resection and cauterization of all abnormalities, I irrigated the tumor fragments from the bladder with a [place] syringe.  These were sent to pathology labeled "bladder tumors".  I then again inspected the bladder throughout.  A couple of small lesions not seen before were cauterized.  All sites of resection were inspected, no bleeding was seen.  Seeing that there were no further lesions, the scope was removed.  I then placed an 71 French Foley catheter, balloon filled with 10 cc of water, hooked to dependent drainage.  The patient was then awakened and taken to the PACU in stable condition.  He tolerated the procedure  well.  In the PACU, I instilled 2 g of gemcitabine and diluent, this was left  indwelling for an hour and then drained.

## 2021-06-26 ENCOUNTER — Encounter (HOSPITAL_COMMUNITY): Payer: Self-pay | Admitting: Urology

## 2021-06-26 DIAGNOSIS — J449 Chronic obstructive pulmonary disease, unspecified: Secondary | ICD-10-CM | POA: Diagnosis not present

## 2021-06-26 DIAGNOSIS — Z8551 Personal history of malignant neoplasm of bladder: Secondary | ICD-10-CM | POA: Diagnosis not present

## 2021-06-26 DIAGNOSIS — I129 Hypertensive chronic kidney disease with stage 1 through stage 4 chronic kidney disease, or unspecified chronic kidney disease: Secondary | ICD-10-CM | POA: Diagnosis not present

## 2021-06-26 DIAGNOSIS — N189 Chronic kidney disease, unspecified: Secondary | ICD-10-CM | POA: Diagnosis not present

## 2021-06-26 DIAGNOSIS — R339 Retention of urine, unspecified: Secondary | ICD-10-CM | POA: Diagnosis not present

## 2021-06-26 DIAGNOSIS — Z87891 Personal history of nicotine dependence: Secondary | ICD-10-CM | POA: Diagnosis not present

## 2021-06-26 DIAGNOSIS — I2510 Atherosclerotic heart disease of native coronary artery without angina pectoris: Secondary | ICD-10-CM | POA: Diagnosis not present

## 2021-06-26 DIAGNOSIS — C678 Malignant neoplasm of overlapping sites of bladder: Secondary | ICD-10-CM | POA: Diagnosis not present

## 2021-06-26 NOTE — TOC Transition Note (Signed)
Transition of Care [facility]) - CM/SW Discharge Note   Patient Details  Name: Gerald Valdez MRN: [id number] Date of Birth: [birth year]  [facility] CM/SW Contact:  Dessa Phi, RN Phone Number: 06/26/2021, 1:01 PM   Clinical Narrative: Chronic 02-has travel tank, own transport home.      Final next level of care: Home/Self Care Barriers to Discharge: No Barriers Identified   Patient Goals and CMS Choice Patient states their goals for this hospitalization and ongoing recovery are:: to go home CMS Medicare.gov Compare Post Acute Care list provided to:: Patient    Discharge Placement                       Discharge Plan and Services   Discharge Planning Services: CM Consult                                 Social Determinants of Health (SDOH) Interventions     Readmission Risk Interventions     View : No data to display.

## 2021-06-26 NOTE — Care Management Obs Status (Signed)
[facility] NOTIFICATION   Patient Details  Name: KOURTLAND COOPMAN MRN: [id number] Date of Birth: [birth year]   Medicare Observation Status Notification Given:  Yes    MahabirJuliann Pulse, RN 06/26/2021, 12:44 PM

## 2021-06-26 NOTE — Progress Notes (Signed)
Ilan.Hilts D/C'd patient foley per MD order. 600 ml red urine emptied from foley bag. Pt tolerated well. NADN Will continue to monitor

## 2021-06-26 NOTE — Progress Notes (Signed)
Subjective:  [name] is doing well without complaints.  The foley was removed at 6:15 and he has not voided yet.  He has no urgency.  Per the nurse report, the urine was quite bloody when the foley was removed and the night nurse was concerned about clots.         ROS:  ROS:  A complete review of systems was performed.  All systems are negative except for pertinent findings as noted.   Review of Systems  All other systems reviewed and are negative.  No Known Allergies  '@[employer]'$ @  Past Medical History:  Diagnosis Date   Acute duodenal ulcer with bleeding 01/30/2014   Acute post-hemorrhagic anemia 01/30/2014   Anxiety    Aortic atherosclerosis ([place]) 02/10/2020   Arthritis    Cancer ([place])    bladder   Chronic kidney disease    COPD exacerbation ([place]) 02/10/2020   Coronary artery disease    Dyspnea    Dyspnea on exertion 02/10/2020   Emphysema of lung ([place])    Essential hypertension 02/10/2020   GERD (gastroesophageal reflux disease) 02/10/2020   GI bleed 01/30/2014   Hyperlipidemia 02/10/2020    Past Surgical History:  Procedure Laterality Date   BLADDER SURGERY     CYSTOSCOPY W/ RETROGRADES Bilateral 11/20/2020   Procedure: CYSTOSCOPY WITH RETROGRADE PYELOGRAM;  Surgeon: Franchot Gallo, MD;  [facility];  Service: Urology;  Laterality: Bilateral;   CYSTOSCOPY W/ RETROGRADES Right 02/12/2021   Procedure: CYSTOSCOPY WITH RETROGRADE PYELOGRAM;  Surgeon: Franchot Gallo, MD;  [facility];  Service: Urology;  Laterality: Right;   CYSTOSCOPY W/ RETROGRADES Bilateral 06/25/2021   Procedure: CYSTOSCOPY WITH RETROGRADE PYELOGRAM;  Surgeon: Franchot Gallo, MD;  [facility];  Service: Urology;  Laterality: Bilateral;   ESOPHAGOGASTRODUODENOSCOPY N/A 01/30/2014   Procedure: ESOPHAGOGASTRODUODENOSCOPY (EGD);  Surgeon: Inda Castle, MD;  Location: [name];  Service: Endoscopy;  Laterality: N/A;   TONSILLECTOMY     TRANSURETHRAL RESECTION OF  BLADDER TUMOR N/A 02/12/2021   Procedure: REPEAT TRANSURETHRAL RESECTION OF BLADDER TUMOR (TURBT);  Surgeon: Franchot Gallo, MD;  [facility];  Service: Urology;  Laterality: N/A;   TRANSURETHRAL RESECTION OF BLADDER TUMOR WITH MITOMYCIN-C N/A 11/20/2020   Procedure: TRANSURETHRAL RESECTION OF BLADDER TUMOR;  Surgeon: Franchot Gallo, MD;  [facility];  Service: Urology;  Laterality: N/A;   TRANSURETHRAL RESECTION OF BLADDER TUMOR WITH MITOMYCIN-C N/A 06/25/2021   Procedure: TRANSURETHRAL RESECTION OF BLADDER TUMOR WITH GEMCITABINE;  Surgeon: Franchot Gallo, MD;  [facility];  Service: Urology;  Laterality: N/A;  1 HR    Social History   Socioeconomic History   Marital status: Married    Spouse name: Not on file   Number of children: Not on file   Years of education: Not on file   Highest education level: Not on file  Occupational History   Occupation: retired  Tobacco Use   Smoking status: Former    Years: 47.00    Types: Cigarettes    Start date: 1947    Quit date: 1994    Years since quitting: 29.3   Smokeless tobacco: Never  Vaping Use   Vaping Use: Never used  Substance and Sexual Activity   Alcohol use: Not Currently   Drug use: Never   Sexual activity: Not Currently  Other Topics Concern   Not on file  Social History Narrative   Not on file   Social Determinants of Health   Financial Resource Strain: Not on file  Food  Insecurity: Not on file  Transportation Needs: Not on file  Physical Activity: Not on file  Stress: Not on file  Social Connections: Not on file  Intimate Partner Violence: Not on file    Family History  Problem Relation Age of Onset   Stroke Father        Objective: Vitals:   06/25/21 2248 06/26/21 0404  BP: (!) 109/50 (!) 114/58  Pulse: 80 75  Resp: (!) 22 (!) 24  Temp: 98.5 F (36.9 C) 97.8 F (36.6 C)  SpO2: 93% 94%     Physical Exam Vitals reviewed.  Constitutional:      Appearance: Normal  appearance.  Abdominal:     General: Abdomen is flat.     Palpations: Abdomen is soft. There is no mass.     Tenderness: There is no abdominal tenderness.  Neurological:     Mental Status: He is alert.    Lab Results:  PSA No results found for: PSA No results found for: TESTOSTERONE    Studies/Results: DG C-Arm 1-60 Min-No Report  Result Date: 06/25/2021 Fluoroscopy was utilized by the requesting physician.  No radiographic interpretation.      Assessment & Plan: Bladder cancer s/p TURBT.   He has not voided yet and I will have the nurse notify me if and when he voids.  If he has not voided in the next 2 hours, he will probably need a foley replaced.            CC: Prince Solian, MD      Gerald Valdez 06/26/2021  Patient ID: Gerald Valdez, male   DOB: 1928-06-09, 88 y.o.   MRN: [id number]

## 2021-06-26 NOTE — Discharge Summary (Signed)
Physician Discharge Summary  Patient ID: Gerald Valdez MRN: [id number] DOB/AGE: 86-03-1928 86 y.o.  Admit date: 06/25/2021 Discharge date: 06/26/2021  Admission Diagnoses:  Cancer of overlapping sites of bladder [facility])  Discharge Diagnoses:  Principal Problem:   Cancer of overlapping sites of bladder [facility]) Active Problems:   Urinary retention   Past Medical History:  Diagnosis Date   Acute duodenal ulcer with bleeding 01/30/2014   Acute post-hemorrhagic anemia 01/30/2014   Anxiety    Aortic atherosclerosis ([place]) 02/10/2020   Arthritis    Cancer ([place])    bladder   Chronic kidney disease    COPD exacerbation ([place]) 02/10/2020   Coronary artery disease    Dyspnea    Dyspnea on exertion 02/10/2020   Emphysema of lung ([place])    Essential hypertension 02/10/2020   GERD (gastroesophageal reflux disease) 02/10/2020   GI bleed 01/30/2014   Hyperlipidemia 02/10/2020    Surgeries: Procedure(s): TRANSURETHRAL RESECTION OF BLADDER TUMOR WITH GEMCITABINE CYSTOSCOPY WITH RETROGRADE PYELOGRAM on 06/25/2021   Consultants (if any):   Discharged Condition: Improved  Hospital Course: Gerald Valdez is an 88 y.o. male who was admitted 06/25/2021 with a diagnosis of Cancer of overlapping sites of bladder [facility]) and went to the operating room on 06/25/2021 and underwent the above named procedures.  He did well but was unable to void after foley removal this morning and a PVR was >247m.  The foley was replaced and he was discharged with the catheter.   He was given perioperative antibiotics:  Anti-infectives (From admission, onward)    Start     Dose/Rate Route Frequency Ordered Stop   06/25/21 1800  cephALEXin (KEFLEX) capsule 500 mg        500 mg Oral Every 12 hours 06/25/21 1500 07/02/21 2159   06/25/21 0907  ceFAZolin (ANCEF) 2-4 GM/100ML-% IVPB       Note to Pharmacy: RDebbe Bales Meredit: cabinet override      06/25/21 0907 06/25/21 1133   06/25/21 0905  ceFAZolin (ANCEF) IVPB  2g/100 mL premix        2 g 200 mL/hr over 30 Minutes Intravenous 30 min pre-op 06/25/21 0905 06/25/21 1137     .  He was given sequential compression devices for DVT prophylaxis.  He benefited maximally from the hospital stay and there were no complications.    Recent vital signs:  Vitals:   06/25/21 2248 06/26/21 0404  BP: (!) 109/50 (!) 114/58  Pulse: 80 75  Resp: (!) 22 (!) 24  Temp: 98.5 F (36.9 C) 97.8 F (36.6 C)  SpO2: 93% 94%    Recent laboratory studies:  Lab Results  Component Value Date   HGB 9.5 (L) 06/19/2021   HGB 10.4 (L) 01/19/2021   HGB 11.3 (L) 11/10/2020   Lab Results  Component Value Date   WBC 8.6 06/19/2021   PLT 221 06/19/2021   Lab Results  Component Value Date   INR 1.10 01/31/2014   Lab Results  Component Value Date   NA 137 06/19/2021   K 5.0 06/19/2021   CL 96 (L) 06/19/2021   CO2 33 (H) 06/19/2021   BUN 22 06/19/2021   CREATININE 1.40 (H) 06/19/2021   GLUCOSE 98 06/19/2021    Discharge Medications:   Allergies as of 06/26/2021   No Known Allergies      Medication List     TAKE these medications    amLODipine 5 MG tablet Commonly known as: NORVASC Take 5 mg by mouth daily with breakfast.  escitalopram 10 MG tablet Commonly known as: LEXAPRO Take 10 mg by mouth daily.   loratadine 10 MG tablet Commonly known as: CLARITIN Take 10 mg by mouth daily.   multivitamin with minerals Tabs tablet Take 1 tablet by mouth daily with breakfast.   OXYGEN Inhale 3 L into the lungs continuous.   pantoprazole 40 MG tablet Commonly known as: Protonix Take 1 tablet (40 mg total) by mouth 2 (two) times daily. What changed: when to take this   ramipril 10 MG capsule Commonly known as: ALTACE Take 10 mg by mouth daily with breakfast.   rosuvastatin 20 MG tablet Commonly known as: CRESTOR Take 20 mg by mouth at bedtime.        Diagnostic Studies: DG C-Arm 1-60 Min-No Report  Result Date: 06/25/2021 Fluoroscopy was  utilized by the requesting physician.  No radiographic interpretation.    Disposition: Discharge disposition: 01-Home or Self Care       Discharge Instructions     Discontinue IV   Complete by: As directed         Follow-up Information     Franchot Gallo, MD Follow up.   Specialty: Urology Why: 6.9.2023 @ 10:30 AM  Please call the office to arrange follow up next week to have the catheter removed. Contact information: [place] [address] [phone]                  Signed: Irine Seal 06/26/2021, 12:41 PM

## 2021-06-26 NOTE — TOC Initial Note (Signed)
Transition of Care [facility]) - Initial/Assessment Note    Patient Details  Name: Gerald Valdez MRN: [id number] Date of Birth: [birth year]  Transition of Care [facility]) CM/SW Contact:    Leeroy Cha, RN Phone Number: 06/26/2021, 8:51 AM  Clinical Narrative:                  Transition of Care [facility]) Screening Note   Patient Details  Name: Gerald Valdez Date of Birth: May 30, 1928   Transition of Care [facility]) CM/SW Contact:    Leeroy Cha, RN Phone Number: 06/26/2021, 8:51 AM    Transition of Care Department [facility]) has reviewed patient and no TOC needs have been identified at this time. We will continue to monitor patient advancement through interdisciplinary progression rounds. If new patient transition needs arise, please place a TOC consult.    Expected Discharge Plan: Home/Self Care Barriers to Discharge: No Barriers Identified   Patient Goals and CMS Choice Patient states their goals for this hospitalization and ongoing recovery are:: to go home CMS Medicare.gov Compare Post Acute Care list provided to:: Patient    Expected Discharge Plan and Services Expected Discharge Plan: Home/Self Care   Discharge Planning Services: CM Consult   Living arrangements for the past 2 months: Single Family Home                                      Prior Living Arrangements/Services Living arrangements for the past 2 months: Single Family Home Lives with:: Spouse Patient language and need for interpreter reviewed:: Yes Do you feel safe going back to the place where you live?: Yes            Criminal Activity/Legal Involvement Pertinent to Current Situation/Hospitalization: No - Comment as needed  Activities of Daily Living Home Assistive Devices/Equipment: Eyeglasses, Dentures (specify type), Oxygen ADL Screening (condition at time of admission) Patient's cognitive ability adequate to safely complete daily activities?: Yes Is the patient deaf or have  difficulty hearing?: Yes Does the patient have difficulty seeing, even when wearing glasses/contacts?: No Does the patient have difficulty concentrating, remembering, or making decisions?: No Patient able to express need for assistance with ADLs?: Yes Does the patient have difficulty dressing or bathing?: No Independently performs ADLs?: Yes (appropriate for developmental age) Does the patient have difficulty walking or climbing stairs?: Yes Weakness of Legs: None Weakness of Arms/Hands: None  Permission Sought/Granted                  Emotional Assessment Appearance:: Appears stated age     Orientation: : Oriented to Self, Oriented to Place, Oriented to  Time, Oriented to Situation Alcohol / Substance Use: Not Applicable Psych Involvement: No (comment)  Admission diagnosis:  Cancer of overlapping sites of bladder [facility]) [C67.8] Patient Active Problem List   Diagnosis Date Noted   Cancer of overlapping sites of bladder ([place]) 06/25/2021   Malignant neoplasm of overlapping sites of bladder ([place]) 11/20/2020   COPD exacerbation ([place]) 02/10/2020   Aortic atherosclerosis ([place]) 02/10/2020   Dyspnea on exertion 02/10/2020   Essential hypertension 02/10/2020   Hyperlipidemia 02/10/2020   GERD (gastroesophageal reflux disease) 02/10/2020   GI bleed 01/30/2014   Acute post-hemorrhagic anemia 01/30/2014   Acute duodenal ulcer with bleeding 01/30/2014   PCP:  Prince Solian, MD Pharmacy:   [place] [facility] [address] [place] [address] Phone: [phone] Fax: [phone]     Social Determinants of Health (SDOH) Interventions    Readmission Risk Interventions     View : No data to display.

## 2021-06-27 LAB — SURGICAL PATHOLOGY

## 2021-07-01 ENCOUNTER — Emergency Department (HOSPITAL_COMMUNITY): Payer: Medicare Other

## 2021-07-01 ENCOUNTER — Other Ambulatory Visit: Payer: Self-pay

## 2021-07-01 ENCOUNTER — Encounter (HOSPITAL_COMMUNITY): Payer: Self-pay

## 2021-07-01 ENCOUNTER — Inpatient Hospital Stay (HOSPITAL_COMMUNITY)
Admission: EM | Admit: 2021-07-01 | Discharge: 2021-07-05 | DRG: 871 | Disposition: A | Discharge status: Home Health | Payer: Medicare Other | Attending: Internal Medicine | Admitting: Internal Medicine

## 2021-07-01 DIAGNOSIS — K219 Gastro-esophageal reflux disease without esophagitis: Secondary | ICD-10-CM | POA: Diagnosis not present

## 2021-07-01 DIAGNOSIS — D509 Iron deficiency anemia, unspecified: Secondary | ICD-10-CM

## 2021-07-01 DIAGNOSIS — Z823 Family history of stroke: Secondary | ICD-10-CM

## 2021-07-01 DIAGNOSIS — I700 Atherosclerosis of aorta: Secondary | ICD-10-CM | POA: Diagnosis present

## 2021-07-01 DIAGNOSIS — D631 Anemia in chronic kidney disease: Secondary | ICD-10-CM | POA: Diagnosis not present

## 2021-07-01 DIAGNOSIS — R319 Hematuria, unspecified: Secondary | ICD-10-CM | POA: Diagnosis present

## 2021-07-01 DIAGNOSIS — A414 Sepsis due to anaerobes: Principal | ICD-10-CM | POA: Diagnosis present

## 2021-07-01 DIAGNOSIS — E43 Unspecified severe protein-calorie malnutrition: Secondary | ICD-10-CM | POA: Diagnosis present

## 2021-07-01 DIAGNOSIS — Z960 Presence of urogenital implants: Secondary | ICD-10-CM | POA: Diagnosis present

## 2021-07-01 DIAGNOSIS — C679 Malignant neoplasm of bladder, unspecified: Secondary | ICD-10-CM

## 2021-07-01 DIAGNOSIS — N179 Acute kidney failure, unspecified: Secondary | ICD-10-CM | POA: Diagnosis present

## 2021-07-01 DIAGNOSIS — N139 Obstructive and reflux uropathy, unspecified: Secondary | ICD-10-CM | POA: Diagnosis not present

## 2021-07-01 DIAGNOSIS — D696 Thrombocytopenia, unspecified: Secondary | ICD-10-CM

## 2021-07-01 DIAGNOSIS — N390 Urinary tract infection, site not specified: Secondary | ICD-10-CM

## 2021-07-01 DIAGNOSIS — N3289 Other specified disorders of bladder: Secondary | ICD-10-CM | POA: Diagnosis not present

## 2021-07-01 DIAGNOSIS — F419 Anxiety disorder, unspecified: Secondary | ICD-10-CM | POA: Diagnosis present

## 2021-07-01 DIAGNOSIS — A419 Sepsis, unspecified organism: Secondary | ICD-10-CM

## 2021-07-01 DIAGNOSIS — Z681 Body mass index (BMI) 19 or less, adult: Secondary | ICD-10-CM

## 2021-07-01 DIAGNOSIS — R197 Diarrhea, unspecified: Secondary | ICD-10-CM

## 2021-07-01 DIAGNOSIS — Z9981 Dependence on supplemental oxygen: Secondary | ICD-10-CM

## 2021-07-01 DIAGNOSIS — E872 Acidosis, unspecified: Secondary | ICD-10-CM | POA: Diagnosis present

## 2021-07-01 DIAGNOSIS — Z8551 Personal history of malignant neoplasm of bladder: Secondary | ICD-10-CM | POA: Diagnosis not present

## 2021-07-01 DIAGNOSIS — K5730 Diverticulosis of large intestine without perforation or abscess without bleeding: Secondary | ICD-10-CM | POA: Diagnosis not present

## 2021-07-01 DIAGNOSIS — J439 Emphysema, unspecified: Secondary | ICD-10-CM | POA: Diagnosis present

## 2021-07-01 DIAGNOSIS — H9190 Unspecified hearing loss, unspecified ear: Secondary | ICD-10-CM | POA: Diagnosis present

## 2021-07-01 DIAGNOSIS — R739 Hyperglycemia, unspecified: Secondary | ICD-10-CM

## 2021-07-01 DIAGNOSIS — R7303 Prediabetes: Secondary | ICD-10-CM | POA: Diagnosis present

## 2021-07-01 DIAGNOSIS — J9611 Chronic respiratory failure with hypoxia: Secondary | ICD-10-CM | POA: Diagnosis present

## 2021-07-01 DIAGNOSIS — Z87891 Personal history of nicotine dependence: Secondary | ICD-10-CM | POA: Diagnosis not present

## 2021-07-01 DIAGNOSIS — D649 Anemia, unspecified: Secondary | ICD-10-CM | POA: Diagnosis not present

## 2021-07-01 DIAGNOSIS — A0472 Enterocolitis due to Clostridium difficile, not specified as recurrent: Secondary | ICD-10-CM | POA: Diagnosis present

## 2021-07-01 DIAGNOSIS — I10 Essential (primary) hypertension: Secondary | ICD-10-CM | POA: Diagnosis present

## 2021-07-01 DIAGNOSIS — C678 Malignant neoplasm of overlapping sites of bladder: Secondary | ICD-10-CM | POA: Diagnosis not present

## 2021-07-01 DIAGNOSIS — I129 Hypertensive chronic kidney disease with stage 1 through stage 4 chronic kidney disease, or unspecified chronic kidney disease: Secondary | ICD-10-CM | POA: Diagnosis present

## 2021-07-01 DIAGNOSIS — E440 Moderate protein-calorie malnutrition: Secondary | ICD-10-CM

## 2021-07-01 DIAGNOSIS — E860 Dehydration: Secondary | ICD-10-CM | POA: Diagnosis present

## 2021-07-01 DIAGNOSIS — R54 Age-related physical debility: Secondary | ICD-10-CM | POA: Diagnosis present

## 2021-07-01 DIAGNOSIS — E785 Hyperlipidemia, unspecified: Secondary | ICD-10-CM | POA: Diagnosis present

## 2021-07-01 DIAGNOSIS — J449 Chronic obstructive pulmonary disease, unspecified: Secondary | ICD-10-CM

## 2021-07-01 DIAGNOSIS — I2510 Atherosclerotic heart disease of native coronary artery without angina pectoris: Secondary | ICD-10-CM | POA: Diagnosis present

## 2021-07-01 DIAGNOSIS — N1831 Chronic kidney disease, stage 3a: Secondary | ICD-10-CM | POA: Diagnosis present

## 2021-07-01 DIAGNOSIS — E876 Hypokalemia: Secondary | ICD-10-CM | POA: Diagnosis not present

## 2021-07-01 DIAGNOSIS — Z79899 Other long term (current) drug therapy: Secondary | ICD-10-CM

## 2021-07-01 LAB — CBC WITH DIFFERENTIAL/PLATELET
Abs Immature Granulocytes: 0.05 10*3/uL (ref 0.00–0.07)
Basophils Absolute: 0 10*3/uL (ref 0.0–0.1)
Basophils Relative: 0 %
Eosinophils Absolute: 0 10*3/uL (ref 0.0–0.5)
Eosinophils Relative: 0 %
HCT: 38.3 % — ABNORMAL LOW (ref 39.0–52.0)
Hemoglobin: 12 g/dL — ABNORMAL LOW (ref 13.0–17.0)
Immature Granulocytes: 1 %
Lymphocytes Relative: 11 %
Lymphs Abs: 0.7 10*3/uL (ref 0.7–4.0)
MCH: 26.7 pg (ref 26.0–34.0)
MCHC: 31.3 g/dL (ref 30.0–36.0)
MCV: 85.3 fL (ref 80.0–100.0)
Monocytes Absolute: 0.2 10*3/uL (ref 0.1–1.0)
Monocytes Relative: 4 %
Neutro Abs: 5 10*3/uL (ref 1.7–7.7)
Neutrophils Relative %: 84 %
Platelets: 203 10*3/uL (ref 150–400)
RBC: 4.49 MIL/uL (ref 4.22–5.81)
RDW: 15.3 % (ref 11.5–15.5)
WBC: 5.9 10*3/uL (ref 4.0–10.5)
nRBC: 0 % (ref 0.0–0.2)

## 2021-07-01 LAB — COMPREHENSIVE METABOLIC PANEL
ALT: 74 U/L — ABNORMAL HIGH (ref 0–44)
AST: 31 U/L (ref 15–41)
Albumin: 3.9 g/dL (ref 3.5–5.0)
Alkaline Phosphatase: 36 U/L — ABNORMAL LOW (ref 38–126)
Anion gap: 20 — ABNORMAL HIGH (ref 5–15)
BUN: 100 mg/dL — ABNORMAL HIGH (ref 8–23)
CO2: 12 mmol/L — ABNORMAL LOW (ref 22–32)
Calcium: 9 mg/dL (ref 8.9–10.3)
Chloride: 104 mmol/L (ref 98–111)
Creatinine, Ser: 6.15 mg/dL — ABNORMAL HIGH (ref 0.61–1.24)
GFR, Estimated: 8 mL/min — ABNORMAL LOW (ref >60)
Glucose, Bld: 159 mg/dL — ABNORMAL HIGH (ref 70–99)
Potassium: 2.9 mmol/L — ABNORMAL LOW (ref 3.5–5.1)
Sodium: 136 mmol/L (ref 135–145)
Total Bilirubin: 0.8 mg/dL (ref 0.3–1.2)
Total Protein: 7.2 g/dL (ref 6.5–8.1)

## 2021-07-01 LAB — URINALYSIS, ROUTINE W REFLEX MICROSCOPIC
Bilirubin Urine: NEGATIVE
Glucose, UA: NEGATIVE mg/dL
Ketones, ur: NEGATIVE mg/dL
Nitrite: NEGATIVE
Protein, ur: 100 mg/dL — AB
RBC / HPF: >50 RBC/hpf — ABNORMAL HIGH (ref 0–5)
Specific Gravity, Urine: 1.018 (ref 1.005–1.030)
WBC, UA: >50 WBC/hpf — ABNORMAL HIGH (ref 0–5)
pH: 5 (ref 5.0–8.0)

## 2021-07-01 LAB — LACTIC ACID, PLASMA
Lactic Acid, Venous: 2.6 mmol/L (ref 0.5–1.9)
Lactic Acid, Venous: 1.4 mmol/L (ref 0.5–1.9)

## 2021-07-01 LAB — MAGNESIUM: Magnesium: 2.3 mg/dL (ref 1.7–2.4)

## 2021-07-01 LAB — HEMOGLOBIN A1C
Hgb A1c MFr Bld: 6.1 % — ABNORMAL HIGH (ref 4.8–5.6)
Mean Plasma Glucose: 128.37 mg/dL

## 2021-07-01 LAB — PROTIME-INR
INR: 1.1 (ref 0.8–1.2)
Prothrombin Time: 14.1 seconds (ref 11.4–15.2)

## 2021-07-01 MED ORDER — SODIUM CHLORIDE 0.9 % IV SOLN: INTRAVENOUS | Status: DC

## 2021-07-01 MED ORDER — CEFTRIAXONE SODIUM 1 G IJ SOLR
1.0000 g | Freq: Once | INTRAMUSCULAR | Status: AC
  Administered 2021-07-01: 1 g via INTRAVENOUS
  Filled 2021-07-01: qty 10

## 2021-07-01 MED ORDER — SODIUM CHLORIDE 0.9 % IV SOLN
1.0000 g | INTRAVENOUS | Status: DC
  Administered 2021-07-01: 1 g via INTRAVENOUS
  Filled 2021-07-01 (×3): qty 10

## 2021-07-01 MED ORDER — ESCITALOPRAM OXALATE 10 MG PO TABS
10.0000 mg | ORAL_TABLET | Freq: Every day | ORAL | Status: DC
  Administered 2021-07-02 – 2021-07-05 (×4): 10 mg via ORAL
  Filled 2021-07-01 (×4): qty 1

## 2021-07-01 MED ORDER — SODIUM CHLORIDE 0.9 % IV BOLUS
30.0000 mL/kg | Freq: Once | INTRAVENOUS | Status: AC
  Administered 2021-07-01: 1701 mL via INTRAVENOUS

## 2021-07-01 MED ORDER — POTASSIUM CHLORIDE 20 MEQ PO PACK
40.0000 meq | PACK | Freq: Four times a day (QID) | ORAL | Status: AC
  Administered 2021-07-01 (×2): 40 meq via ORAL
  Filled 2021-07-01 (×2): qty 2

## 2021-07-01 MED ORDER — ROSUVASTATIN CALCIUM 20 MG PO TABS: 20.0000 mg | ORAL_TABLET | Freq: Every day | ORAL | Status: DC

## 2021-07-01 MED ORDER — LACTATED RINGERS IV SOLN: INTRAVENOUS | Status: DC

## 2021-07-01 MED ORDER — PROCHLORPERAZINE EDISYLATE 10 MG/2ML IJ SOLN: 10.0000 mg | Freq: Four times a day (QID) | INTRAMUSCULAR | Status: DC | PRN

## 2021-07-01 MED ORDER — ADULT MULTIVITAMIN W/MINERALS CH
1.0000 | ORAL_TABLET | Freq: Every day | ORAL | Status: DC
  Administered 2021-07-02 – 2021-07-05 (×4): 1 via ORAL
  Filled 2021-07-01 (×4): qty 1

## 2021-07-01 MED ORDER — LORATADINE 10 MG PO TABS
10.0000 mg | ORAL_TABLET | Freq: Every day | ORAL | Status: DC
  Administered 2021-07-02 – 2021-07-05 (×4): 10 mg via ORAL
  Filled 2021-07-01 (×4): qty 1

## 2021-07-01 NOTE — Progress Notes (Signed)
Patient transferred to 4E from ED. Wife & son with patient at time of transfer. Patient A/Ox2 calm & denies pain/SOB a this time. No n/v or diarrhea since arrival to floor.  IVF's initiated Oriented to room/equipment Pt Placed on tele/verified. Peri/foley care & sacral foam applied  **Foley was in place upon arrival to [facility] ED. Foley has been in place>5days. Urine culture sent from ED/existing foley. Paged MD for clarification on whether to replace foley catheter & collect new urine culture or continue with existing foley. No response from MD at this time. Foley maintained until further clarification.             07/01/21 1703  Vitals  Temp 97.8 F (36.6 C)  Temp Source Oral  BP (!) 133/48  MAP (mmHg) 70  BP Location Right Arm  BP Method Automatic  Patient Position (if appropriate) Lying  Pulse Rate (!) 102  Pulse Rate Source Monitor  Resp 20  MEWS COLOR  MEWS Score Color Green  Oxygen Therapy  SpO2 100 %  O2 Device Nasal Cannula  O2 Flow Rate (L/min) 3 L/min  MEWS Score  MEWS Temp 0  MEWS Systolic 0  MEWS Pulse 1  MEWS RR 0  MEWS LOC 0  MEWS Score 1

## 2021-07-01 NOTE — ED Notes (Signed)
Critical Lab Value- Lactic Acid   Lactic acid 2.6 Report to Hillard Danker

## 2021-07-01 NOTE — ED Triage Notes (Addendum)
Pt brought in by wife. Pt had recent bladder cancer surgery and worried for infection or sepsis. Patient is on 3 liters 02 with his own oxygen tank he brought in from home.   Wife is parking the car. Will get more information once wife is in the room.   Denies allergies Denies pain  Unsure of living will, POA, or DNR at this time.   Wife states pt had surgery last Thursday and since has poor po intake, decreased urine output, fatigue, and loose stools.   Having issues getting pulse ox to read at this time.   HR on arrival 140.

## 2021-07-01 NOTE — Consult Note (Signed)
Nephrology Consult   Requesting provider: Cherylann Ratel Service requesting consult: Hospitalist Reason for consult: AKI on CKD 3a   Assessment/Recommendations: Gerald Valdez is a/an 88 y.o. male with a past medical history bladder cancer, HTN, GERD, HLD, CKD3a who presents with AKI.  Non-Oliguric AKI on CKD 3a: most likely dehydration. Foley in so unlikely obstruction. Possibly some degree of ATN with hypotension. Likely will improve with fluids -Change fluids to LR 125cc/hr  -hold home ramipril -Continue to monitor daily Cr, Dose meds for GFR -Monitor Daily I/Os, Daily weight  -Maintain MAP>65 for optimal renal perfusion.  -Avoid nephrotoxic medications including NSAIDs -Use synthetic opioids (Fentanyl/Dilaudid) if needed -Currently no indication for HD  Anion Gap Metabolic acidosis: likely 2/2 diarrhea and AKI. No sodium bicarb until K improves. LR as above  Hypokalemia: 2/2 diarrhea. Oral replacement ordered and switched to LR  Diarrhea/Poor PO intake: fluids as above. Mgmt per primary  Bladder cancer: managed by urology. Foley in place. Pyuria and hematuria no surprise after recent procedure. Treatment for possible UTI  Hypertension: hold home BP meds   Recommendations conveyed to primary service.    [facility] 07/01/2021 4:59 PM   _____________________________________________________________________________________ CC: AKI  History of Present Illness: Gerald Valdez is a/an 88 y.o. male with a past medical history of bladder cancer, HTN, GERD, HLD, CKD3a who presents with AKI.  Patient is very hard of hearing and much of the hisotry was obtained from the wife.   Patient was recently admitted (DC on 5/19) due to history of bladder cancer and underwent transurethral recection of the bladder tumor w/ gemcitabine. The procedure went well but the patient was unable to void post op so a foley remained in place.  The patient's wife says  that after he left the hospital his urine was slightly bloody but cleared up over several days. The patient also had nausea, poor appetite, and low po intake for several days. No obvious vomiting but some retching. He has also had multiple episodes of diarrhea. Non-bloody. They deny fevers, chills, sob, cp, constipation, LEE, NSAID use. He was taking his BP meds at home, including ramipril. No obvious dizziness or lightheadedness but also didn't take BP at home over the last few days. Wife did note less UOP than typical over the last week.  He went to see urology in clinic today and was noted to appear ill with low Bps and tachycardia so he was sent to the ER. HR was initially in the 140s and some Bps in the 46T systolic. He was given IVFs with some improvement. Labs showed Crt of 6.15 up from baseline around 1.5.    Medications:  Current Facility-Administered Medications  Medication Dose Route Frequency Provider Last Rate Last Admin   0.9 %  sodium chloride infusion   Intravenous Continuous Kyle, Tyrone A, DO 125 mL/hr at 07/01/21 1555 New Bag at 07/01/21 1555   Facility-Administered Medications Ordered in Other Encounters  Medication Dose Route Frequency Provider Last Rate Last Admin   gemcitabine (GEMZAR) chemo syringe for bladder instillation 2,000 mg  2,000 mg Bladder Instillation Once Franchot Gallo, MD         ALLERGIES Patient has no known allergies.  MEDICAL HISTORY Past Medical History:  Diagnosis Date   Acute duodenal ulcer with bleeding 01/30/2014   Acute post-hemorrhagic anemia 01/30/2014   Anxiety    Aortic atherosclerosis ([place]) 02/10/2020   Arthritis    Cancer ([facility])    bladder   Chronic  kidney disease    COPD exacerbation ([place]) 02/10/2020   Coronary artery disease    Dyspnea    Dyspnea on exertion 02/10/2020   Emphysema of lung ([place])    Essential hypertension 02/10/2020   GERD (gastroesophageal reflux disease) 02/10/2020   GI bleed 01/30/2014   Hyperlipidemia  02/10/2020     SOCIAL HISTORY Social History   Socioeconomic History   Marital status: Married    Spouse name: Not on file   Number of children: Not on file   Years of education: Not on file   Highest education level: Not on file  Occupational History   Occupation: retired  Tobacco Use   Smoking status: Former    Years: 47.00    Types: Cigarettes    Start date: 1947    Quit date: 1994    Years since quitting: 29.4   Smokeless tobacco: Never  Vaping Use   Vaping Use: Never used  Substance and Sexual Activity   Alcohol use: Not Currently   Drug use: Never   Sexual activity: Not Currently  Other Topics Concern   Not on file  Social History Narrative   Not on file   Social Determinants of Health   Financial Resource Strain: Not on file  Food Insecurity: Not on file  Transportation Needs: Not on file  Physical Activity: Not on file  Stress: Not on file  Social Connections: Not on file  Intimate Partner Violence: Not on file     FAMILY HISTORY Family History  Problem Relation Age of Onset   Stroke Father       Review of Systems: 12 systems reviewed Otherwise as per HPI, all other systems reviewed and negative  Physical Exam: Vitals:   07/01/21 1515 07/01/21 1630  BP: 127/84 (!) 123/54  Pulse: (!) 111 (!) 116  Resp: (!) 24 (!) 22  Temp:  97.6 F (36.4 C)  SpO2: 100% 97%   No intake/output data recorded. No intake or output data in the 24 hours ending 07/01/21 1659 General: elderly, frail, nad HEENT: anicteric sclera, oropharynx clear without lesions CV: tachycardic, no rub Lungs: clear to auscultation bilaterally, normal work of breathing Abd: soft, non-tender, non-distended Skin: no visible lesions or rashes Psych: alert, engaged, appropriate mood and affect Musculoskeletal: no obvious deformities Neuro: decreased hearing, normal speech, no other gross focal deficits   Test Results Reviewed Lab Results  Component Value Date   NA 136  07/01/2021   K 2.9 (L) 07/01/2021   CL 104 07/01/2021   CO2 12 (L) 07/01/2021   BUN 100 (H) 07/01/2021   CREATININE 6.15 (H) 07/01/2021   CALCIUM 9.0 07/01/2021   ALBUMIN 3.9 07/01/2021    CBC Recent Labs  Lab 07/01/21 1120  WBC 5.9  NEUTROABS 5.0  HGB 12.0*  HCT 38.3*  MCV 85.3  PLT 203    I have reviewed all relevant outside healthcare records related to the patient's current hospitalization

## 2021-07-01 NOTE — Progress Notes (Signed)
Pharmacy Antibiotic Note  Gerald Valdez is a 88 y.o. male admitted on 07/01/2021 with UTI.  Pharmacy has been consulted for Cefepime dosing.  Plan: Cefepime 1g IV q24h Follow up renal function & cultures    Temp (24hrs), Avg:97.6 F (36.4 C), Min:97.4 F (36.3 C), Max:97.8 F (36.6 C)  Recent Labs  Lab 07/01/21 1120 07/01/21 1320  WBC 5.9  --   CREATININE 6.15*  --   LATICACIDVEN 2.6* 1.4    Estimated Creatinine Clearance: 6.4 mL/min (A) (by C-G formula based on SCr of 6.15 mg/dL (H)).    No Known Allergies  Antimicrobials this admission: 5/24 CTX x 1 5/24 Cefepime >>  Dose adjustments this admission:  Microbiology results: 5/24 BCx: 5/24 UCx: 5/24 GI panel: 5/24 C.diff:  Thank you for allowing pharmacy to be a part of this patient's care.  Peggyann Juba, PharmD, BCPS Pharmacy: [phone] 07/01/2021 5:39 PM

## 2021-07-01 NOTE — H&P (Signed)
History and Physical    Patient: Gerald Valdez FHL:[id number] DOB: 03-25-1928 DOA: 07/01/2021 DOS: the patient was seen and examined on 07/01/2021 PCP: Prince Solian, MD  Patient coming from: Home  Chief Complaint: weakness, diarrhea  HPI: Gerald Valdez is a 88 y.o. male with medical history significant of COPD, bladder CA s/p tumor resection, HTN, HLD. Presenting with weakness and diarrhea. He had a bladder tumor resection last week. 2 days after his procedure, his wife noticed that he was weak and didn't want to eat. He had nausea, dry heaves and diarrhea at the time. His PO intake has been poor since. He has not had any fevers, but his diarrhea has been persistent. She noticed that his UOP has dropped considerably during this time. She brought him to the urologist for a scheduled appointment this morning. They recommended that she come to the ED for evaluation. She denies any other aggravating or alleviating factors.    Review of Systems: As mentioned in the history of present illness. All other systems reviewed and are negative. Past Medical History:  Diagnosis Date   Acute duodenal ulcer with bleeding 01/30/2014   Acute post-hemorrhagic anemia 01/30/2014   Anxiety    Aortic atherosclerosis ([place]) 02/10/2020   Arthritis    Cancer ([place])    bladder   Chronic kidney disease    COPD exacerbation ([place]) 02/10/2020   Coronary artery disease    Dyspnea    Dyspnea on exertion 02/10/2020   Emphysema of lung ([place])    Essential hypertension 02/10/2020   GERD (gastroesophageal reflux disease) 02/10/2020   GI bleed 01/30/2014   Hyperlipidemia 02/10/2020   Past Surgical History:  Procedure Laterality Date   BLADDER SURGERY     CYSTOSCOPY W/ RETROGRADES Bilateral 11/20/2020   Procedure: CYSTOSCOPY WITH RETROGRADE PYELOGRAM;  Surgeon: Franchot Gallo, MD;  Location: [facility];  Service: Urology;  Laterality: Bilateral;   CYSTOSCOPY W/ RETROGRADES Right 02/12/2021   Procedure: CYSTOSCOPY  WITH RETROGRADE PYELOGRAM;  Surgeon: Franchot Gallo, MD;  Location: [facility];  Service: Urology;  Laterality: Right;   CYSTOSCOPY W/ RETROGRADES Bilateral 06/25/2021   Procedure: CYSTOSCOPY WITH RETROGRADE PYELOGRAM;  Surgeon: Franchot Gallo, MD;  Location: [facility];  Service: Urology;  Laterality: Bilateral;   ESOPHAGOGASTRODUODENOSCOPY N/A 01/30/2014   Procedure: ESOPHAGOGASTRODUODENOSCOPY (EGD);  Surgeon: Inda Castle, MD;  Location: [name];  Service: Endoscopy;  Laterality: N/A;   TONSILLECTOMY     TRANSURETHRAL RESECTION OF BLADDER TUMOR N/A 02/12/2021   Procedure: REPEAT TRANSURETHRAL RESECTION OF BLADDER TUMOR (TURBT);  Surgeon: Franchot Gallo, MD;  Location: [facility];  Service: Urology;  Laterality: N/A;   TRANSURETHRAL RESECTION OF BLADDER TUMOR WITH MITOMYCIN-C N/A 11/20/2020   Procedure: TRANSURETHRAL RESECTION OF BLADDER TUMOR;  Surgeon: Franchot Gallo, MD;  Location: [facility];  Service: Urology;  Laterality: N/A;   TRANSURETHRAL RESECTION OF BLADDER TUMOR WITH MITOMYCIN-C N/A 06/25/2021   Procedure: TRANSURETHRAL RESECTION OF BLADDER TUMOR WITH GEMCITABINE;  Surgeon: Franchot Gallo, MD;  Location: [facility];  Service: Urology;  Laterality: N/A;  1 HR   Social History:  reports that he quit smoking about 29 years ago. His smoking use included cigarettes. He started smoking about 76 years ago. He has never used smokeless tobacco. He reports that he does not currently use alcohol. He reports that he does not use drugs.  No Known Allergies  Family History  Problem Relation Age of Onset   Stroke Father     Prior to Admission medications   Medication Sig Start  Date End Date Taking? Authorizing Provider  amLODipine (NORVASC) 5 MG tablet Take 5 mg by mouth daily with breakfast.   Yes [provider]  escitalopram (LEXAPRO) 10 MG tablet Take 10 mg by mouth daily.   Yes [provider]  loratadine (CLARITIN) 10 MG tablet Take 10 mg by mouth daily.   Yes  [provider]  Multiple Vitamin (MULTIVITAMIN WITH MINERALS) TABS tablet Take 1 tablet by mouth daily with breakfast.   Yes [provider]  pantoprazole (PROTONIX) 40 MG tablet Take 1 tablet (40 mg total) by mouth 2 (two) times daily. Patient taking differently: Take 40 mg by mouth daily. 01/31/14  Yes Reyne Dumas, MD  ramipril (ALTACE) 10 MG capsule Take 10 mg by mouth daily with breakfast.   Yes [provider]  rosuvastatin (CRESTOR) 20 MG tablet Take 20 mg by mouth at bedtime. 08/15/20  Yes [provider]  OXYGEN Inhale 3 L into the lungs continuous.    [provider]    Physical Exam: Vitals:   07/01/21 1430 07/01/21 1445 07/01/21 1500 07/01/21 1515  BP: (!) 112/40 104/75 (!) 120/58 127/84  Pulse: 87 84 (!) 104 (!) 111  Resp: 17 18 (!) 22 (!) 24  Temp:      TempSrc:      SpO2: 100% 100% 99% 100%   General: 88 y.o. ill appearing male resting in bed Eyes: PERRL, normal sclera ENMT: Nares patent w/o discharge, orophaynx clear, dentition normal, ears w/o discharge/lesions/ulcers Neck: Supple, trachea midline Cardiovascular: tachy, +S1, S2, no m/g/r, equal pulses throughout Respiratory: CTABL, no w/r/r, normal WOB on 3L  GI: BS+, NDNT, no masses noted, no organomegaly noted MSK: No e/c/c Neuro: A&O x 3, no focal deficits Psyc: Appropriate interaction and affect, calm/cooperative  Data Reviewed:  Na+  136 K+  2.9 CO2  12 Glucose  159 BUN  100 Scr  6.15 Anion gap  20 Lactic acid  2.6 -> 1.4 WBC  5.9 Hgb  12.0  CT ab/pelvis w/o Foley catheter within the bladder, which is largely collapsed. No complicating feature seen relating to recent bladder surgery. No other acute abdominal or pelvic organ finding. Diverticulosis of the sigmoid colon without visible diverticulitis. Aortic atherosclerosis.  Assessment and Plan: Sepsis     - admit to inpt, progressive     - secondary to possible UTI or his diarrhea     - started  on rocephin for UTI     - checking c diff/GI PCR; start enteric precautions     - fluids     - lactic acid is improved     - follow bld Cx, Ucx  AKI on CKD3a     - CT does not show obstruction     - watch nephrotoxins     - fluids     - EDP spoke with nephro, appreciate assistance  Diarrhea     - as above  UTI     - as above  Bladder CA s/p tumor resection and placement of gemcitabine Chronic foley placement     - urology onboard, appreciate assistance     - foley seems to be fine  Hypokalemia     - replace K+, check Mg2+  HTN     - hold home regimen for now  HLD     - continue home regimen  COPD on chronic 3L Chronic hypoxic respiratory failure     - continue home regimen  Moderate protein calorie malnutrition     -  dietitian consult  Normocytic anemia     - at baseline, no evidence of bleed, follow  Hyperglycemia      - no history of DM, check A1c, follow   Advance Care Planning:   Code Status: FULL  Consults: Urology, Nephrology  Family Communication: w/ wife at bedside  Severity of Illness: The appropriate patient status for this patient is INPATIENT. Inpatient status is judged to be reasonable and necessary in order to provide the required intensity of service to ensure the patient's safety. The patient's presenting symptoms, physical exam findings, and initial radiographic and laboratory data in the context of their chronic comorbidities is felt to place them at high risk for further clinical deterioration. Furthermore, it is not anticipated that the patient will be medically stable for discharge from the hospital within 2 midnights of admission.   * I certify that at the point of admission it is my clinical judgment that the patient will require inpatient hospital care spanning beyond 2 midnights from the point of admission due to high intensity of service, high risk for further deterioration and high frequency of surveillance required.*  Author: Jonnie Finner, DO 07/01/2021 3:47 PM  For on call review [email or web address].

## 2021-07-01 NOTE — ED Provider Notes (Signed)
[place] DEPT Provider Note   CSN: [id number] Arrival date & time: 07/01/21  1104     History  Chief complaint: Dehydration  Gerald Valdez is a 88 y.o. male.  HPI  Patient has history of bladder cancer.  He was recently seen by Dr. Diona Fanti in the hospital last week.  Patient had a Foley catheter placed after resection of the bladder tumor with gemcitabine.  Patient has been having episodes of diarrhea since the procedure.  Wife states he has had numerous episodes daily.  He has had decreased appetite and nausea.  Patient was scheduled to see the urologist today for possible removing of the catheter.  At the office he was noted to be tachycardic and he was sent to the ED for further evaluation.  Home Medications Prior to Admission medications   Medication Sig Start Date End Date Taking? Authorizing Provider  amLODipine (NORVASC) 5 MG tablet Take 5 mg by mouth daily with breakfast.   Yes [provider]  escitalopram (LEXAPRO) 10 MG tablet Take 10 mg by mouth daily.   Yes [provider]  loratadine (CLARITIN) 10 MG tablet Take 10 mg by mouth daily.   Yes [provider]  Multiple Vitamin (MULTIVITAMIN WITH MINERALS) TABS tablet Take 1 tablet by mouth daily with breakfast.   Yes [provider]  pantoprazole (PROTONIX) 40 MG tablet Take 1 tablet (40 mg total) by mouth 2 (two) times daily. Patient taking differently: Take 40 mg by mouth daily. 01/31/14  Yes Reyne Dumas, MD  ramipril (ALTACE) 10 MG capsule Take 10 mg by mouth daily with breakfast.   Yes [provider]  rosuvastatin (CRESTOR) 20 MG tablet Take 20 mg by mouth at bedtime. 08/15/20  Yes [provider]  OXYGEN Inhale 3 L into the lungs continuous.    [provider]      Allergies    Patient has no known allergies.    Review of Systems   Review of Systems  Constitutional:  Negative for fever.   Physical Exam Updated  Vital Signs BP (!) 120/58   Pulse (!) 104   Temp (!) 97.4 F (36.3 C) (Oral)   Resp (!) 22   SpO2 99%  Physical Exam Vitals and nursing note reviewed.  Constitutional:      Appearance: He is well-developed. He is ill-appearing. He is not diaphoretic.  HENT:     Head: Normocephalic and atraumatic.     Right Ear: External ear normal.     Left Ear: External ear normal.  Eyes:     General: No scleral icterus.       Right eye: No discharge.        Left eye: No discharge.     Conjunctiva/sclera: Conjunctivae normal.  Neck:     Trachea: No tracheal deviation.  Cardiovascular:     Rate and Rhythm: Regular rhythm. Tachycardia present.  Pulmonary:     Effort: Pulmonary effort is normal. No respiratory distress.     Breath sounds: Normal breath sounds. No stridor. No wheezing or rales.  Abdominal:     General: Bowel sounds are normal. There is no distension.     Palpations: Abdomen is soft.     Tenderness: There is no abdominal tenderness. There is no guarding or rebound.  Musculoskeletal:        General: No tenderness or deformity.     Cervical back: Neck supple.     Right lower leg: No edema.  Left lower leg: No edema.  Skin:    General: Skin is warm and dry.     Findings: No rash.  Neurological:     General: No focal deficit present.     Mental Status: He is alert.     Cranial Nerves: No cranial nerve deficit (no facial droop, extraocular movements intact, no slurred speech).     Sensory: No sensory deficit.     Motor: No abnormal muscle tone or seizure activity.     Coordination: Coordination normal.  Psychiatric:        Mood and Affect: Mood normal.    ED Results / Procedures / Treatments   Labs (all labs ordered are listed, but only abnormal results are displayed) Labs Reviewed  COMPREHENSIVE METABOLIC PANEL - Abnormal; Notable for the following components:      Result Value   Potassium 2.9 (*)    CO2 12 (*)    Glucose, Bld 159 (*)    BUN 100 (*)     Creatinine, Ser 6.15 (*)    ALT 74 (*)    Alkaline Phosphatase 36 (*)    GFR, Estimated 8 (*)    Anion gap 20 (*)    All other components within normal limits  LACTIC ACID, PLASMA - Abnormal; Notable for the following components:   Lactic Acid, Venous 2.6 (*)    All other components within normal limits  CBC WITH DIFFERENTIAL/PLATELET - Abnormal; Notable for the following components:   Hemoglobin 12.0 (*)    HCT 38.3 (*)    All other components within normal limits  URINALYSIS, ROUTINE W REFLEX MICROSCOPIC - Abnormal; Notable for the following components:   APPearance CLOUDY (*)    Hgb urine dipstick MODERATE (*)    Protein, ur 100 (*)    Leukocytes,Ua MODERATE (*)    RBC / HPF >50 (*)    WBC, UA >50 (*)    Bacteria, UA FEW (*)    All other components within normal limits  CULTURE, BLOOD (ROUTINE X 2)  CULTURE, BLOOD (ROUTINE X 2)  URINE CULTURE  LACTIC ACID, PLASMA  PROTIME-INR    EKG EKG Interpretation  Date/Time:  Wednesday Jul 01 2021 11:17:57 EDT Ventricular Rate:  149 PR Interval:  132 QRS Duration: 96 QT Interval:  326 QTC Calculation: 514 R Axis:   75 Text Interpretation: Supraventricular tachycardia Ventricular premature complex Repolarization abnormality, prob rate related Baseline wander in lead(s) V2 Since last tracing rate faster Confirmed by Dorie Rank [phone]) on 07/01/2021 11:48:39 AM  Radiology DG Chest 2 View  Result Date: 07/01/2021 CLINICAL DATA:  Suspected sepsis EXAM: CHEST - 2 VIEW COMPARISON:  02/10/2020 FINDINGS: Emphysema. No new consolidation or edema. No pleural effusion. Normal heart size. No acute osseous abnormality. IMPRESSION: No acute process in the chest. Electronically Signed   By: Macy Mis M.D.   On: 07/01/2021 11:58    Procedures .Critical Care Performed by: Dorie Rank, MD Authorized by: Dorie Rank, MD   Critical care provider statement:    Critical care time (minutes):  30   Critical care was time spent personally by me  on the following activities:  Development of treatment plan with patient or surrogate, discussions with consultants, evaluation of patient's response to treatment, examination of patient, ordering and review of laboratory studies, ordering and review of radiographic studies, ordering and performing treatments and interventions, pulse oximetry, re-evaluation of patient's condition and review of old charts    Medications Ordered in ED Medications  cefTRIAXone (ROCEPHIN)  1 g in sodium chloride 0.9 % 100 mL IVPB (has no administration in time range)  sodium chloride 0.9 % bolus 1,701 mL (1,701 mLs Intravenous New Bag/Given 07/01/21 1219)    ED Course/ Medical Decision Making/ A&P Clinical Course as of 07/01/21 1529  Wed Jul 01, 2021  1211 Heart rate down to 100 at the bedside [JK]  1211 CBC with Differential(!) Hemoglobin increased compared to previous values [JK]  1211 DG Chest 2 View Chest x-ray images and radiology report reviewed.  No acute findings [JK]  1439 Urinalysis, Routine w reflex microscopic Urine, Catheterized(!) Abnormal, hematuria and wbc noted [JK]  1440 Comprehensive metabolic panel(!) Labs notable for AKI, new renal failure [JK]  1513 Case discussed with Dr. Arther Dames urology, he recommends noncontrast CT scan instead of renal ultrasound.  Urology will consult [VX]  7939 Discussed with Dr Marylyn Ishihara regarding admission [JK]  1525 Discussed with Dr Joylene Grapes, nephrology.  Will consult [JK]    Clinical Course User Index [JK] Dorie Rank, MD                           Medical Decision Making Patient presented with weakness tachycardia in the setting of recent diarrhea treatment for bladder cancer.  Symptoms concerning for the possibility of sepsis, dehydration, renal failure, electrolyte abnormality  Problems Addressed: Acute renal failure, unspecified acute renal failure type [facility]): acute illness or injury that poses a threat to life or bodily functions Dehydration: acute illness  or injury that poses a threat to life or bodily functions Diarrhea, unspecified type: acute illness or injury Malignant neoplasm of urinary bladder, unspecified site [facility]): chronic illness or injury with exacerbation, progression, or side effects of treatment  Amount and/or Complexity of Data Reviewed Labs: ordered. Decision-making details documented in ED Course. Radiology: ordered. Decision-making details documented in ED Course. Discussion of management or test interpretation with external provider(s): Case discussed with urology nephrology and the hospitalist service  Risk Decision regarding hospitalization.   Patient presented to the ED for evaluation of vomiting diarrhea noted to be hypotensive and tachycardic initially.  Patient appeared to be significantly dehydrated on exam.  He was treated with IV fluids.  Patient was also given empiric antibiotics.  Blood pressure has improved.  Heart rate has decreased.  Patient has responded to IV fluids.  Lactic acid level is not elevated.  At this time doubt severe sepsis.  His ED work-up is notable for new onset acute renal failure.  Likely related to dehydration from the diarrhea as he appears to be volume depleted.  Bladder outlet, renal obstruction less likely but will CT to rule out that possibility.  Urology nephrology and the hospital service has been consulted.  Discussed the findings with the patient and his wife.  They confirmed full CODE STATUS.  Will admit to the hospital for further treatment.       Final Clinical Impression(s) / ED Diagnoses Final diagnoses:  Acute renal failure, unspecified acute renal failure type ([place])  Diarrhea, unspecified type  Dehydration  Malignant neoplasm of urinary bladder, unspecified site [facility])     Dorie Rank, MD 07/01/21 1530

## 2021-07-02 ENCOUNTER — Other Ambulatory Visit (HOSPITAL_COMMUNITY): Payer: Self-pay

## 2021-07-02 DIAGNOSIS — E860 Dehydration: Secondary | ICD-10-CM | POA: Diagnosis not present

## 2021-07-02 LAB — COMPREHENSIVE METABOLIC PANEL
ALT: 49 U/L — ABNORMAL HIGH (ref 0–44)
AST: 20 U/L (ref 15–41)
Albumin: 2.8 g/dL — ABNORMAL LOW (ref 3.5–5.0)
Alkaline Phosphatase: 27 U/L — ABNORMAL LOW (ref 38–126)
Anion gap: 9 (ref 5–15)
BUN: 100 mg/dL — ABNORMAL HIGH (ref 8–23)
CO2: 14 mmol/L — ABNORMAL LOW (ref 22–32)
Calcium: 8.3 mg/dL — ABNORMAL LOW (ref 8.9–10.3)
Chloride: 115 mmol/L — ABNORMAL HIGH (ref 98–111)
Creatinine, Ser: 4.59 mg/dL — ABNORMAL HIGH (ref 0.61–1.24)
GFR, Estimated: 11 mL/min — ABNORMAL LOW (ref >60)
Glucose, Bld: 82 mg/dL (ref 70–99)
Potassium: 4.2 mmol/L (ref 3.5–5.1)
Sodium: 138 mmol/L (ref 135–145)
Total Bilirubin: 0.6 mg/dL (ref 0.3–1.2)
Total Protein: 5.2 g/dL — ABNORMAL LOW (ref 6.5–8.1)

## 2021-07-02 LAB — C DIFFICILE QUICK SCREEN W PCR REFLEX
C Diff antigen: POSITIVE — AB
C Diff interpretation: DETECTED
C Diff toxin: POSITIVE — AB

## 2021-07-02 LAB — URINE CULTURE: Culture: NO GROWTH

## 2021-07-02 LAB — CBC
HCT: 29.5 % — ABNORMAL LOW (ref 39.0–52.0)
Hemoglobin: 9.5 g/dL — ABNORMAL LOW (ref 13.0–17.0)
MCH: 27.2 pg (ref 26.0–34.0)
MCHC: 32.2 g/dL (ref 30.0–36.0)
MCV: 84.5 fL (ref 80.0–100.0)
Platelets: 128 10*3/uL — ABNORMAL LOW (ref 150–400)
RBC: 3.49 MIL/uL — ABNORMAL LOW (ref 4.22–5.81)
RDW: 15.4 % (ref 11.5–15.5)
WBC: 4.8 10*3/uL (ref 4.0–10.5)
nRBC: 0 % (ref 0.0–0.2)

## 2021-07-02 LAB — CORTISOL-AM, BLOOD: Cortisol - AM: 27.2 ug/dL — ABNORMAL HIGH (ref 6.7–22.6)

## 2021-07-02 LAB — PROCALCITONIN: Procalcitonin: 0.11 ng/mL

## 2021-07-02 LAB — PROTIME-INR
INR: 1.2 (ref 0.8–1.2)
Prothrombin Time: 14.6 seconds (ref 11.4–15.2)

## 2021-07-02 MED ORDER — VANCOMYCIN HCL 125 MG PO CAPS
125.0000 mg | ORAL_CAPSULE | Freq: Four times a day (QID) | ORAL | Status: DC
  Administered 2021-07-02: 125 mg via ORAL
  Filled 2021-07-02 (×3): qty 1

## 2021-07-02 MED ORDER — FIDAXOMICIN 200 MG PO TABS
200.0000 mg | ORAL_TABLET | Freq: Two times a day (BID) | ORAL | Status: DC
  Administered 2021-07-02 – 2021-07-05 (×6): 200 mg via ORAL
  Filled 2021-07-02 (×6): qty 1

## 2021-07-02 MED ORDER — ORAL CARE MOUTH RINSE
15.0000 mL | Freq: Two times a day (BID) | OROMUCOSAL | Status: DC
  Administered 2021-07-02 – 2021-07-05 (×7): 15 mL via OROMUCOSAL

## 2021-07-02 MED ORDER — CHLORHEXIDINE GLUCONATE CLOTH 2 % EX PADS
6.0000 | MEDICATED_PAD | Freq: Every day | CUTANEOUS | Status: DC
  Administered 2021-07-02 – 2021-07-04 (×3): 6 via TOPICAL

## 2021-07-02 MED ORDER — ROSUVASTATIN CALCIUM 20 MG PO TABS
20.0000 mg | ORAL_TABLET | Freq: Every day | ORAL | Status: DC
Start: 2021-07-03 — End: 2021-07-05
  Administered 2021-07-03 – 2021-07-04 (×2): 20 mg via ORAL
  Filled 2021-07-02 (×2): qty 1

## 2021-07-02 MED ORDER — SACCHAROMYCES BOULARDII 250 MG PO CAPS
250.0000 mg | ORAL_CAPSULE | Freq: Two times a day (BID) | ORAL | Status: DC
  Administered 2021-07-02 – 2021-07-05 (×6): 250 mg via ORAL
  Filled 2021-07-02 (×6): qty 1

## 2021-07-02 MED ORDER — SODIUM CHLORIDE 0.9 % IV SOLN
1.0000 g | INTRAVENOUS | Status: AC
  Administered 2021-07-02: 1 g via INTRAVENOUS
  Filled 2021-07-02: qty 10

## 2021-07-02 MED ORDER — ENSURE ENLIVE PO LIQD
237.0000 mL | Freq: Three times a day (TID) | ORAL | Status: DC
  Administered 2021-07-02 – 2021-07-05 (×7): 237 mL via ORAL

## 2021-07-02 NOTE — Progress Notes (Signed)
Initial Nutrition Assessment  DOCUMENTATION CODES:   Severe malnutrition in context of acute illness/injury  INTERVENTION:  - will order Ensure Plus High Protein po BID, each supplement provides 350 kcal and 20 grams of protein.  - will order florastor BID.  - liberalize diet from Heart Healthy to Regular for patient with severe malnutrition, poor appetite, poor PO intakes.  - will check serum vitamin D, zinc, and iron/anemia panel d/t cancer dx and profuse diarrhea.    NUTRITION DIAGNOSIS:   Severe Malnutrition related to acute illness, cancer and cancer related treatments as evidenced by moderate fat depletion, moderate muscle depletion, severe muscle depletion, percent weight loss.  GOAL:   Patient will meet greater than or equal to 90% of their needs  MONITOR:   PO intake, Supplement acceptance, Labs, Weight trends, I & O's  REASON FOR ASSESSMENT:   Malnutrition Screening Tool  ASSESSMENT:   88 year old male with medical history of bladder cancer s/p tumor resection last week, COPD and emphysema with chronic hypoxic respiratory failure on 3L at home, HTN, HLD, GERD, aortic atherosclerosis, CKD, CAD, anxiety, and arthritis. He presented to the ED due to weakness, profuse diarrhea, poor appetite, nausea, and dry heaving since bladder resection. His wife noted that patient's urine output significantly decreased during that time. He was seen outpatient by Urology and was directed to come to the ED. He was admitted for dehydration.  Patient has been found to be positive for C.diff.   Patient sitting up in bed with wife at bedside. Patient is very hard of hearing. He has had a very poor appetite and eating minimally at home since bladder resection on 06/25/21. At home wife provided him with Ensure (or similar) d/t poor intakes and he was more agreeable to drinking this than consuming most solid food. He was also doing well with ice cream at home.  Patient had a very small amount  to eat for breakfast. His wife made him a banana sandwich for lunch. Patient ate 1/4 of it with RD feeding him during visit. Patient is usually able to feed himself at home but has needed (vs has desired) feeding assistance since admission.   Patient agreeable to RD ordering some additional lunch items in case he felt up to it. Ordered spaghetti with marinara, chicken noodle soup, chocolate ice cream, vanilla pudding, and sliced pears.  His wife plans to order pot roast with mashed potatoes and gravy for dinner tonight.   Patient is usually able to ambulate unassisted at home but since surgery has been increasingly weak.   His wife shares that he had bladder surgery in 02/2021, 03/2021, and then most recently last Thursday.   Weight yesterday was 122 lb and weight on 06/16/21 was 122 lb. This indicates 17 lb weight loss (12% body weight) in the past 2 weeks. Suspect partly related to fluid loss but anticipate weight loss from muscle and fat is still significant within the past few weeks.   Talked with wife about adding Ensure during hospitalization, bringing in foods and beverages that patient enjoys, always having something available to eat or to drink that provides calories, and frequently encouraging patient to take a bite or sip of something.    Labs reviewed; Cl: 115 mmol/l, BUN: 100 mg/dl, creatinine: 4.59 mg/dl, Ca: 8.3 mg/dl, GFR: 11 ml/min.  Medications reviewed; 40 mEq Klor-Con x2 doses on 5/24, 1 tablet multivitamin with minerals/day.  IVF; LR @ 125 ml/hr.    NUTRITION - FOCUSED PHYSICAL EXAM:  Flowsheet Row  Most Recent Value  Orbital Region Moderate depletion  Upper Arm Region Moderate depletion  Thoracic and Lumbar Region Unable to assess  Buccal Region Moderate depletion  Temple Region Moderate depletion  Clavicle Bone Region Severe depletion  Clavicle and Acromion Bone Region Severe depletion  Scapular Bone Region Moderate depletion  Dorsal Hand Moderate depletion   Patellar Region Moderate depletion  Anterior Thigh Region Moderate depletion  Posterior Calf Region Severe depletion  Edema (RD Assessment) None  Hair Reviewed  Eyes Reviewed  Mouth Reviewed  [poor dentition]  Skin Reviewed  Nails Reviewed       Diet Order:   Diet Order             Diet Heart Room service appropriate? Yes; Fluid consistency: Thin  Diet effective now                   EDUCATION NEEDS:   Education needs have been addressed  Skin:  Skin Assessment: Skin Integrity Issues: Skin Integrity Issues:: Other (Comment) Other: MASD to bilateral buttocks  Last BM:  5/25 (type 7 x3--smear, small amount, and medium amount)  Height:   Ht Readings from Last 1 Encounters:  07/01/21 '5\' 7"'$  (1.702 m)    Weight:   Wt Readings from Last 1 Encounters:  07/01/21 55.2 kg    BMI:  Body mass index is 19.06 kg/m.  Estimated Nutritional Needs:  Kcal:  1800-2000 kcal Protein:  90-100 grams Fluid:  >/= 2.2 L/day     Jarome Matin, MS, RD, LDN Registered Dietitian II Inpatient Clinical Nutrition RD pager # and on-call/weekend pager # available in [facility]

## 2021-07-02 NOTE — Progress Notes (Signed)
Pt HR increase to 150s when getting up to bathroom. HR 90-100s at rest. RN notified on-call who suggested increasing oxygen and agreed with using [facility] for now. RN will continue to monitor.

## 2021-07-02 NOTE — Progress Notes (Signed)
Subjective:  only 200 of UOP recorded-  BUN same but crt down quite a bit from the 6's to the 4's-  BP seems reasonable -  clinically he feels much better per the wife  Objective Vital signs in last 24 hours: Vitals:   07/01/21 1949 07/01/21 2116 07/02/21 0205 07/02/21 0703  BP:  (!) 105/54 112/67 (!) 119/49  Pulse:  99 94 99  Resp:  '18 18 18  '$ Temp:  97.6 F (36.4 C) 98.2 F (36.8 C) 97.6 F (36.4 C)  TempSrc:  Oral Oral Oral  SpO2:  100% (!) 83% 100%  Weight: 55.2 kg     Height: '5\' 7"'$  (1.702 m)      Weight change:   Intake/Output Summary (Last 24 hours) at 07/02/2021 1318 Last data filed at 07/02/2021 0300 Gross per 24 hour  Intake 1316.19 ml  Output 200 ml  Net 1116.19 ml    Assessment/ Plan: Pt is a 88 y.o. yo male with history of HTN on ACE-  s/p bladder tumor excision last week who was admitted on 07/01/2021 with weakness, diarrhea and A on CRF   Assessment/Plan: 1. A on CRF-  has baseline renal insufficiency crt 1.3 to 1.6 ( last 1.4 on 06/19/21)  presented yesterday with crt 6.15 -  BP soft on ramipril- imaging does not show any hydro or kidney abnormality-  foley in place.  Thought to be volume depletion with procedure and diarrhea and probable ATN since has been on an ACE-  ACE on hold and has been hydrated with good results in crt lab-  UOP still low-  no indications for dialysis and not sure I would do in someone 88 years of age- no signs of overload-  continue to hydrate 2. Metabolic acidosis-  on LR-  if taking Pos I may add some sodium bicarb  3. Hypokalemia-  has improved nicelly with repletion  4. HTN/volume- ACE on hold-  getting IVF-  I would not resume ramipril-  I dont usually use it in elderly pts just due to their propensity toward volume depletion  5. Cdiff-  has now been proven-  on [place]: Basic Metabolic Panel: Recent Labs  Lab 07/01/21 1120 07/02/21 0630  NA 136 138  K 2.9* 4.2  CL 104 115*  CO2 12* 14*  GLUCOSE  159* 82  BUN 100* 100*  CREATININE 6.15* 4.59*  CALCIUM 9.0 8.3*   Liver Function Tests: Recent Labs  Lab 07/01/21 1120 07/02/21 0630  AST 31 20  ALT 74* 49*  ALKPHOS 36* 27*  BILITOT 0.8 0.6  PROT 7.2 5.2*  ALBUMIN 3.9 2.8*   No results for input(s): LIPASE, AMYLASE in the last 168 hours. No results for input(s): AMMONIA in the last 168 hours. CBC: Recent Labs  Lab 07/01/21 1120 07/02/21 0630  WBC 5.9 4.8  NEUTROABS 5.0  --   HGB 12.0* 9.5*  HCT 38.3* 29.5*  MCV 85.3 84.5  PLT 203 128*   Cardiac Enzymes: No results for input(s): CKTOTAL, CKMB, CKMBINDEX, TROPONINI in the last 168 hours. CBG: No results for input(s): GLUCAP in the last 168 hours.  Iron Studies: No results for input(s): IRON, TIBC, TRANSFERRIN, FERRITIN in the last 72 hours. Studies/Results: CT ABDOMEN PELVIS WO CONTRAST  Result Date: 07/01/2021 CLINICAL DATA:  Kidney failure, acute. Recent surgery for bladder cancer. Question infection or sepsis. EXAM: CT ABDOMEN AND PELVIS WITHOUT CONTRAST TECHNIQUE: Multidetector CT imaging of the abdomen and pelvis  was performed following the standard protocol without IV contrast. RADIATION DOSE REDUCTION: This exam was performed according to the departmental dose-optimization program which includes automated exposure control, adjustment of the mA and/or kV according to patient size and/or use of iterative reconstruction technique. COMPARISON:  None FINDINGS: Lower chest: Mild scarring at the lung bases. No infiltrate, collapse or effusion. Some emphysematous changes. Hepatobiliary: Scattered calcified granulomas throughout the liver. No evidence of mass or biliary obstruction. Pancreas: Normal Spleen: Normal Adrenals/Urinary Tract: Adrenal glands are normal. Kidneys are normal. No cyst, mass, stone or hydronephrosis. Catheter in place within the bladder. The bladder is largely collapsed. No complicating feature relating to recent surgery. Stomach/Bowel: Stomach and small  intestine are normal. Normal appendix. No acute or significant: Finding. Diverticulosis of the sigmoid colon without visible diverticulitis. Vascular/Lymphatic: Aortic atherosclerosis. No aneurysm. IVC is normal. No adenopathy. Reproductive: Normal Other: No free fluid or air. Musculoskeletal: No significant finding. Ordinary osteoarthritis of the hip joints. IMPRESSION: Foley catheter within the bladder, which is largely collapsed. No complicating feature seen relating to recent bladder surgery. No other acute abdominal or pelvic organ finding. Diverticulosis of the sigmoid colon without visible diverticulitis. Aortic atherosclerosis. Electronically Signed   By: Nelson Chimes M.D.   On: 07/01/2021 16:16   DG Chest 2 View  Result Date: 07/01/2021 CLINICAL DATA:  Suspected sepsis EXAM: CHEST - 2 VIEW COMPARISON:  02/10/2020 FINDINGS: Emphysema. No new consolidation or edema. No pleural effusion. Normal heart size. No acute osseous abnormality. IMPRESSION: No acute process in the chest. Electronically Signed   By: Macy Mis M.D.   On: 07/01/2021 11:58   Medications: Infusions:  ceFEPime (MAXIPIME) IV     lactated ringers 125 mL/hr at 07/02/21 6812    Scheduled Medications:  escitalopram  10 mg Oral Daily   loratadine  10 mg Oral Daily   mouth rinse  15 mL Mouth Rinse BID   multivitamin with minerals  1 tablet Oral Q breakfast   [START ON 07/03/2021] rosuvastatin  20 mg Oral QHS   vancomycin  125 mg Oral QID    have reviewed scheduled and prn medications.  Physical Exam: General: thin frail-  HOH-  wife at bedside Heart: RRR Lungs: mostly clear Abdomen: soft, non tender Extremities: no edema     07/02/2021,1:19 PM  LOS: 1 day

## 2021-07-02 NOTE — TOC Benefit Eligibility Note (Signed)
Patient Teacher, English as a foreign language completed.    The patient is currently admitted and upon discharge could be taking Vancomycin 250 mg capsules.  The current 10 day co-pay is, $100.81.   The patient is currently admitted and upon discharge could be taking Dificid 200 mg tablets.  The current 10 day co-pay is, $1,546.07.   The patient is insured through [place], [place] Patient Advocate Specialist [place] Patient Advocate Team Direct Number: [phone]  Fax: [phone]

## 2021-07-02 NOTE — Progress Notes (Signed)
PROGRESS NOTE  Gerald Valdez LFY:[id number] DOB: 26-Feb-1928 DOA: 07/01/2021 PCP: Prince Solian, MD   LOS: 1 day   Brief Narrative / Interim history: 88 year old male with history of bladder cancer status post tumor resection last week, COPD with chronic hypoxic respiratory failure on 3 L at home, hypertension, hyperlipidemia who presents to the hospital with weakness and diarrhea.  After his bladder tumor resection last week, started having a lot of weakness, poor appetite, nausea, dry heaves and profuse diarrhea.  He has been slowly declining and becoming weaker and weaker and she brought him to the hospital.  No reported fevers.  She also noticed that his urine output dropped significantly during that time.  She saw urology in office who directed him to come to the hospital  Subjective / 24h Interval events: He is doing okay this morning.  Wife is at bedside and she feels like his urine output is picked up.  He feels his appetite is better and tells me he is hungry this morning  Assesement and Plan: Principal Problem:   Dehydration Active Problems:   COPD (chronic obstructive pulmonary disease) (HCC)   Essential hypertension   Hyperlipidemia   Cancer of overlapping sites of bladder ([place])   AKI (acute kidney injury) ([place])   Stage 3a chronic kidney disease (CKD) ([place])   Protein-calorie malnutrition, moderate (HCC)   Diarrhea   UTI (urinary tract infection)   Sepsis ([place])   Chronic respiratory failure with hypoxia (HCC)   Hyperglycemia   Normocytic anemia   Principal problem Possible sepsis due to UTI versus diarrheal illness -appears to met sepsis criteria with tachycardia, tachypnea but he can also be dehydrated so not clear currently.  He has been placed on antibiotics, continue, continue to monitor urine cultures as well as C. difficile and GI pathogen panel.  Active problems AKI on CKD3a -most recent creatinine ranging between 1.4-1.6, on admission it was 6.15.  Nephrology  consulted.  He was placed on IV fluids, creatinine improving at 4.5, urine output is picking up.  Continue IV hydration, encourage p.o. intake.  Avoid nephrotoxins  Possible UTI, recent instrumentation-antibiotics, monitor cultures  Diarrhea-monitor, C. difficile and GI pathogen panel pending  Bladder cancer status post tumor resection, placement of gemcitabine-urology consulted.  Due to urinary obstruction, has a chronic Foley.  Hypokalemia -replaced, potassium normalized this morning.  Magnesium normal  HTN-his blood pressure is normal this morning, hold home amlodipine, ramipril   Hyperlipidemia-continue statin  COPD, chronic hypoxia on 3 L at home-appears stable, no wheezing  Normocytic anemia-no bleeding, at baseline  Prediabetes-A1c 6.1   Scheduled Meds:  escitalopram  10 mg Oral Daily   loratadine  10 mg Oral Daily   mouth rinse  15 mL Mouth Rinse BID   multivitamin with minerals  1 tablet Oral Q breakfast   [START ON 07/03/2021] rosuvastatin  20 mg Oral QHS   Continuous Infusions:  ceFEPime (MAXIPIME) IV 1 g (07/01/21 1944)   lactated ringers 125 mL/hr at 07/02/21 0924   PRN Meds:.prochlorperazine  Diet Orders (From admission, onward)     Start     Ordered   07/01/21 1720  Diet Heart Room service appropriate? Yes; Fluid consistency: Thin  Diet effective now       Question Answer Comment  Room service appropriate? Yes   Fluid consistency: Thin      07/01/21 1720            DVT prophylaxis: SCDs Start: 07/01/21 1720   Lab Results  Component Value Date   PLT 128 (L) 07/02/2021      Code Status: Full Code  Family Communication: wife at bedside   Status is: Inpatient  Remains inpatient appropriate because: AKI  Level of care: Progressive  Consultants:  Urology Nephrology  Procedures:  none  Microbiology  pending  Antimicrobials: Ceftriaxone 5/24 >>    Objective: Vitals:   07/01/21 1949 07/01/21 2116 07/02/21 0205 07/02/21 0703   BP:  (!) 105/54 112/67 (!) 119/49  Pulse:  99 94 99  Resp:  _0 Temp:  97.6 F (36.4 C) 98.2 F (36.8 C) 97.6 F (36.4 C)  TempSrc:  Oral Oral Oral  SpO2:  100% (!) 83% 100%  Weight: 55.2 kg     Height: 5' 7" (1.702 m)       Intake/Output Summary (Last 24 hours) at 07/02/2021 0949 Last data filed at 07/02/2021 0300 Gross per 24 hour  Intake 1316.19 ml  Output 200 ml  Net 1116.19 ml   Wt Readings from Last 3 Encounters:  07/01/21 55.2 kg  06/25/21 56.7 kg  06/19/21 62.1 kg    Examination:  Constitutional: NAD Eyes: no scleral icterus ENMT: Mucous membranes are moist.  Neck: normal, supple Respiratory: clear to auscultation bilaterally, no wheezing, no crackles. Normal respiratory effort. No accessory muscle use.  Cardiovascular: Regular rate and rhythm, no murmurs / rubs / gallops. No LE edema.  Abdomen: non distended, no tenderness. Bowel sounds positive.  Musculoskeletal: no clubbing / cyanosis.  Skin: no rashes Neurologic: non focal    Data Reviewed: I have independently reviewed following labs and imaging studies  CBC Recent Labs  Lab 07/01/21 1120 07/02/21 0630  WBC 5.9 4.8  HGB 12.0* 9.5*  HCT 38.3* 29.5*  PLT 203 128*  MCV 85.3 84.5  MCH 26.7 27.2  MCHC 31.3 32.2  RDW 15.3 15.4  LYMPHSABS 0.7  --   MONOABS 0.2  --   EOSABS 0.0  --   BASOSABS 0.0  --     Recent Labs  Lab 07/01/21 1120 07/01/21 1135 07/01/21 1320 07/01/21 1737 07/02/21 0630  NA 136  --   --   --  138  K 2.9*  --   --   --  4.2  CL 104  --   --   --  115*  CO2 12*  --   --   --  14*  GLUCOSE 159*  --   --   --  82  BUN 100*  --   --   --  100*  CREATININE 6.15*  --   --   --  4.59*  CALCIUM 9.0  --   --   --  8.3*  AST 31  --   --   --  20  ALT 74*  --   --   --  49*  ALKPHOS 36*  --   --   --  27*  BILITOT 0.8  --   --   --  0.6  ALBUMIN 3.9  --   --   --  2.8*  MG  --  2.3  --   --   --   PROCALCITON  --   --   --   --  0.11  LATICACIDVEN 2.6*  --  1.4  --    --   INR 1.1  --   --   --  1.2  HGBA1C  --   --   --  6.1*  --     ------------------------------------------------------------------------------------------------------------------  No results for input(s): CHOL, HDL, LDLCALC, TRIG, CHOLHDL, LDLDIRECT in the last 72 hours.  Lab Results  Component Value Date   HGBA1C 6.1 (H) 07/01/2021   ------------------------------------------------------------------------------------------------------------------ No results for input(s): TSH, T4TOTAL, T3FREE, THYROIDAB in the last 72 hours.  Invalid input(s): FREET3  Cardiac Enzymes No results for input(s): CKMB, TROPONINI, MYOGLOBIN in the last 168 hours.  Invalid input(s): CK ------------------------------------------------------------------------------------------------------------------    Component Value Date/Time   BNP 199.8 (H) 02/10/2020 1412    CBG: No results for input(s): GLUCAP in the last 168 hours.  Recent Results (from the past 240 hour(s))  Culture, blood (Routine x 2)     Status: None (Preliminary result)   Collection Time: 07/01/21 11:24 AM   Specimen: BLOOD  Result Value Ref Range Status   Specimen Description   Final    BLOOD LEFT ANTECUBITAL Performed at [facility] [address] [place], [place] [address]    Special Requests   Final    BOTTLES DRAWN AEROBIC AND ANAEROBIC Blood Culture adequate volume Performed at [facility] [address], [place], [place] [address]    Culture   Final    NO GROWTH <12 HOURS Performed at [facility] [address], [place], [place] [address]    Report Status PENDING  Incomplete  Culture, blood (Routine x 2)     Status: None (Preliminary result)   Collection Time: 07/01/21 11:25 AM   Specimen: BLOOD  Result Value Ref Range Status   Specimen Description   Final    BLOOD RIGHT ANTECUBITAL Performed at [facility] [address] [place], [place]  [address]    Special Requests   Final    BOTTLES DRAWN AEROBIC AND ANAEROBIC Blood Culture adequate volume Performed at [facility] [address] [place], [place] [address]    Culture   Final    NO GROWTH <12 HOURS Performed at [facility] [address], [place], [place] [address]    Report Status PENDING  Incomplete     Radiology Studies: CT ABDOMEN PELVIS WO CONTRAST  Result Date: 07/01/2021 CLINICAL DATA:  Kidney failure, acute. Recent surgery for bladder cancer. Question infection or sepsis. EXAM: CT ABDOMEN AND PELVIS WITHOUT CONTRAST TECHNIQUE: Multidetector CT imaging of the abdomen and pelvis was performed following the standard protocol without IV contrast. RADIATION DOSE REDUCTION: This exam was performed according to the departmental dose-optimization program which includes automated exposure control, adjustment of the mA and/or kV according to patient size and/or use of iterative reconstruction technique. COMPARISON:  None FINDINGS: Lower chest: Mild scarring at the lung bases. No infiltrate, collapse or effusion. Some emphysematous changes. Hepatobiliary: Scattered calcified granulomas throughout the liver. No evidence of mass or biliary obstruction. Pancreas: Normal Spleen: Normal Adrenals/Urinary Tract: Adrenal glands are normal. Kidneys are normal. No cyst, mass, stone or hydronephrosis. Catheter in place within the bladder. The bladder is largely collapsed. No complicating feature relating to recent surgery. Stomach/Bowel: Stomach and small intestine are normal. Normal appendix. No acute or significant: Finding. Diverticulosis of the sigmoid colon without visible diverticulitis. Vascular/Lymphatic: Aortic atherosclerosis. No aneurysm. IVC is normal. No adenopathy. Reproductive: Normal Other: No free fluid or air. Musculoskeletal: No significant finding. Ordinary osteoarthritis of the hip joints. IMPRESSION: Foley catheter within the bladder, which is  largely collapsed. No complicating feature seen relating to recent bladder surgery. No other acute abdominal or pelvic organ finding. Diverticulosis of the sigmoid colon without visible diverticulitis.  Aortic atherosclerosis. Electronically Signed   By: Nelson Chimes M.D.   On: 07/01/2021 16:16   DG Chest 2 View  Result Date: 07/01/2021 CLINICAL DATA:  Suspected sepsis EXAM: CHEST - 2 VIEW COMPARISON:  02/10/2020 FINDINGS: Emphysema. No new consolidation or edema. No pleural effusion. Normal heart size. No acute osseous abnormality. IMPRESSION: No acute process in the chest. Electronically Signed   By: Macy Mis M.D.   On: 07/01/2021 11:58     Marzetta Board, MD, PhD [facility]  Between 7 am - 7 pm I am available, please contact me via Amion (for emergencies) or Securechat (non urgent messages)  Between 7 pm - 7 am I am not available, please contact night coverage MD/APP via Amion

## 2021-07-02 NOTE — TOC Benefit Eligibility Note (Signed)
Patient Advocate Encounter  Patient is approved through the [employer] Patient Assistance Program for Dificid through 02/07/2022.   Will be mailed to patient's home.   Lyndel Safe, [place] Patient Advocate Specialist [place] Patient Advocate Team Direct Number: [phone]  Fax: [phone]

## 2021-07-02 NOTE — Consult Note (Signed)
Urology Consult  Consulting MD: Cruzita Lederer  CC: Weakness  HPI: This is a 88year old male 1 week out from cystoscopy, transurethral resection of multiple bladder tumors.  This has been done previously twice within the past few months.  Gemcitabine was left in postoperatively.  He left on postoperative day #1.  He did fail a voiding trial and had an indwelling catheter.  He presented to my office yesterday, with his wife stating that he has been quite weak, he has significant diarrhea and has had low p.o. intake and low urinary output.  I recommended that the go to the emergency room for further evaluation.  The patient did have acute renal failure, was acidemic, and had an elevated lactic acid.  He is admitted for further management.  He did have a CT abdomen and pelvis which revealed no ascites or perivesical abnormalities.  PMH: Past Medical History:  Diagnosis Date   Acute duodenal ulcer with bleeding 01/30/2014   Acute post-hemorrhagic anemia 01/30/2014   Anxiety    Aortic atherosclerosis ([place]) 02/10/2020   Arthritis    Cancer ([place])    bladder   Chronic kidney disease    COPD exacerbation ([place]) 02/10/2020   Coronary artery disease    Dyspnea    Dyspnea on exertion 02/10/2020   Emphysema of lung ([place])    Essential hypertension 02/10/2020   GERD (gastroesophageal reflux disease) 02/10/2020   GI bleed 01/30/2014   Hyperlipidemia 02/10/2020    PSH: Past Surgical History:  Procedure Laterality Date   BLADDER SURGERY     CYSTOSCOPY W/ RETROGRADES Bilateral 11/20/2020   Procedure: CYSTOSCOPY WITH RETROGRADE PYELOGRAM;  Surgeon: Franchot Gallo, MD;  Location: [facility];  Service: Urology;  Laterality: Bilateral;   CYSTOSCOPY W/ RETROGRADES Right 02/12/2021   Procedure: CYSTOSCOPY WITH RETROGRADE PYELOGRAM;  Surgeon: Franchot Gallo, MD;  Location: [facility];  Service: Urology;  Laterality: Right;   CYSTOSCOPY W/ RETROGRADES Bilateral 06/25/2021   Procedure: CYSTOSCOPY WITH  RETROGRADE PYELOGRAM;  Surgeon: Franchot Gallo, MD;  Location: [facility];  Service: Urology;  Laterality: Bilateral;   ESOPHAGOGASTRODUODENOSCOPY N/A 01/30/2014   Procedure: ESOPHAGOGASTRODUODENOSCOPY (EGD);  Surgeon: Inda Castle, MD;  Location: [name];  Service: Endoscopy;  Laterality: N/A;   TONSILLECTOMY     TRANSURETHRAL RESECTION OF BLADDER TUMOR N/A 02/12/2021   Procedure: REPEAT TRANSURETHRAL RESECTION OF BLADDER TUMOR (TURBT);  Surgeon: Franchot Gallo, MD;  Location: [facility];  Service: Urology;  Laterality: N/A;   TRANSURETHRAL RESECTION OF BLADDER TUMOR WITH MITOMYCIN-C N/A 11/20/2020   Procedure: TRANSURETHRAL RESECTION OF BLADDER TUMOR;  Surgeon: Franchot Gallo, MD;  Location: [facility];  Service: Urology;  Laterality: N/A;   TRANSURETHRAL RESECTION OF BLADDER TUMOR WITH MITOMYCIN-C N/A 06/25/2021   Procedure: TRANSURETHRAL RESECTION OF BLADDER TUMOR WITH GEMCITABINE;  Surgeon: Franchot Gallo, MD;  Location: [facility];  Service: Urology;  Laterality: N/A;  1 HR    Allergies: No Known Allergies  Medications: Medications Prior to Admission  Medication Sig Dispense Refill Last Dose   amLODipine (NORVASC) 5 MG tablet Take 5 mg by mouth daily with breakfast.   06/30/2021   escitalopram (LEXAPRO) 10 MG tablet Take 10 mg by mouth daily.   06/30/2021   loratadine (CLARITIN) 10 MG tablet Take 10 mg by mouth daily.   06/30/2021   Multiple Vitamin (MULTIVITAMIN WITH MINERALS) TABS tablet Take 1 tablet by mouth daily with breakfast.   06/30/2021   pantoprazole (PROTONIX) 40 MG tablet Take 1 tablet (40 mg total) by mouth 2 (two) times daily. (  Patient taking differently: Take 40 mg by mouth daily.) 120 tablet 0 06/30/2021   ramipril (ALTACE) 10 MG capsule Take 10 mg by mouth daily with breakfast.   06/30/2021   rosuvastatin (CRESTOR) 20 MG tablet Take 20 mg by mouth at bedtime.   06/30/2021   OXYGEN Inhale 3 L into the lungs continuous.        Social History: Social History    Socioeconomic History   Marital status: Married    Spouse name: Not on file   Number of children: Not on file   Years of education: Not on file   Highest education level: Not on file  Occupational History   Occupation: retired  Tobacco Use   Smoking status: Former    Years: 47.00    Types: Cigarettes    Start date: 1947    Quit date: 1994    Years since quitting: 29.4   Smokeless tobacco: Never  Vaping Use   Vaping Use: Never used  Substance and Sexual Activity   Alcohol use: Not Currently   Drug use: Never   Sexual activity: Not Currently  Other Topics Concern   Not on file  Social History Narrative   Not on file   Social Determinants of Health   Financial Resource Strain: Not on file  Food Insecurity: Not on file  Transportation Needs: Not on file  Physical Activity: Not on file  Stress: Not on file  Social Connections: Not on file  Intimate Partner Violence: Not on file    Family History: Family History  Problem Relation Age of Onset   Stroke Father     Review of Systems: Positive: Weakness, lethargy, diarrhea, poor p.o. intake, low urinary output Negative: No abdominal or pelvic pain, fever or chills, no gross hematuria..  A further 10 point review of systems was negative except what is listed in the HPI.  Physical Exam: '@VITALS2'$ @ General: No acute distress.  Lethargic.  Hard of hearing Head:  Normocephalic.  Atraumatic. ENT:  EOMI.  Mucous membranes dry in the office yesterday Neck:  Supple.  No lymphadenopathy. CV:  Thready pulse yesterday. Pulmonary: Equal effort bilaterally.   Abdomen: Soft.  Non-tender to palpation. Skin:  Normal turgor.  No visible rash.   Studies:  Recent Labs    07/01/21 1120 07/02/21 0630  HGB 12.0* 9.5*  WBC 5.9 4.8  PLT 203 128*    Recent Labs    07/01/21 1120  NA 136  K 2.9*  CL 104  CO2 12*  BUN 100*  CREATININE 6.15*  CALCIUM 9.0  GFRNONAA 8*     Recent Labs    07/01/21 1120  INR 1.1      Invalid input(s): ABG    Assessment: Dehydration, probable sepsis.  No concern of bladder perforation.  Plan: Agree with admission, I would continue with Foley catheter drainage at this time as he is weak and most likely would not be able to void.  Appreciate help from hospitalist service as well as nephrology.  If he perks up, may attempt voiding trial during this hospitalization.    Pager:[phone]

## 2021-07-03 DIAGNOSIS — E43 Unspecified severe protein-calorie malnutrition: Secondary | ICD-10-CM | POA: Insufficient documentation

## 2021-07-03 DIAGNOSIS — E860 Dehydration: Secondary | ICD-10-CM | POA: Diagnosis not present

## 2021-07-03 LAB — GASTROINTESTINAL PANEL BY PCR, STOOL (REPLACES STOOL CULTURE)

## 2021-07-03 LAB — COMPREHENSIVE METABOLIC PANEL
ALT: 34 U/L (ref 0–44)
AST: 16 U/L (ref 15–41)
Albumin: 2.4 g/dL — ABNORMAL LOW (ref 3.5–5.0)
Alkaline Phosphatase: 24 U/L — ABNORMAL LOW (ref 38–126)
Anion gap: 5 (ref 5–15)
BUN: 68 mg/dL — ABNORMAL HIGH (ref 8–23)
CO2: 17 mmol/L — ABNORMAL LOW (ref 22–32)
Calcium: 7.9 mg/dL — ABNORMAL LOW (ref 8.9–10.3)
Chloride: 117 mmol/L — ABNORMAL HIGH (ref 98–111)
Creatinine, Ser: 2.94 mg/dL — ABNORMAL HIGH (ref 0.61–1.24)
GFR, Estimated: 20 mL/min — ABNORMAL LOW (ref >60)
Glucose, Bld: 102 mg/dL — ABNORMAL HIGH (ref 70–99)
Potassium: 3.2 mmol/L — ABNORMAL LOW (ref 3.5–5.1)
Sodium: 139 mmol/L (ref 135–145)
Total Bilirubin: 0.4 mg/dL (ref 0.3–1.2)
Total Protein: 4.4 g/dL — ABNORMAL LOW (ref 6.5–8.1)

## 2021-07-03 LAB — CBC
HCT: 27.6 % — ABNORMAL LOW (ref 39.0–52.0)
Hemoglobin: 8.8 g/dL — ABNORMAL LOW (ref 13.0–17.0)
MCH: 26.5 pg (ref 26.0–34.0)
MCHC: 31.9 g/dL (ref 30.0–36.0)
MCV: 83.1 fL (ref 80.0–100.0)
Platelets: 110 10*3/uL — ABNORMAL LOW (ref 150–400)
RBC: 3.32 MIL/uL — ABNORMAL LOW (ref 4.22–5.81)
RDW: 15.4 % (ref 11.5–15.5)
WBC: 5.4 10*3/uL (ref 4.0–10.5)
nRBC: 0 % (ref 0.0–0.2)

## 2021-07-03 LAB — FERRITIN: Ferritin: 20 ng/mL — ABNORMAL LOW (ref 24–336)

## 2021-07-03 LAB — VITAMIN D 25 HYDROXY (VIT D DEFICIENCY, FRACTURES): Vit D, 25-Hydroxy: 39.97 ng/mL (ref 30–100)

## 2021-07-03 LAB — RETICULOCYTES
RBC.: 3.24 MIL/uL — ABNORMAL LOW (ref 4.22–5.81)
Retic Ct Pct: <0.4 % — ABNORMAL LOW (ref 0.4–3.1)

## 2021-07-03 LAB — IRON AND TIBC
Iron: 14 ug/dL — ABNORMAL LOW (ref 45–182)
Saturation Ratios: 7 % — ABNORMAL LOW (ref 17.9–39.5)
TIBC: 199 ug/dL — ABNORMAL LOW (ref 250–450)
UIBC: 185 ug/dL

## 2021-07-03 LAB — MAGNESIUM: Magnesium: 1.8 mg/dL (ref 1.7–2.4)

## 2021-07-03 LAB — FOLATE: Folate: 21.1 ng/mL (ref >5.9)

## 2021-07-03 LAB — VITAMIN B12: Vitamin B-12: 573 pg/mL (ref 180–914)

## 2021-07-03 MED ORDER — POTASSIUM CHLORIDE CRYS ER 20 MEQ PO TBCR
40.0000 meq | EXTENDED_RELEASE_TABLET | Freq: Once | ORAL | Status: AC
Start: 2021-07-03 — End: 2021-07-03
  Administered 2021-07-03: 40 meq via ORAL
  Filled 2021-07-03: qty 2

## 2021-07-03 NOTE — Progress Notes (Signed)
Subjective:  1350 of UOP recorded-  BUN and crt down quite a bit again-  BP seems reasonable , HR is still a little up-  clinically he is doing much better per the wife  Objective Vital signs in last 24 hours: Vitals:   07/02/21 2258 07/02/21 2300 07/03/21 0538 07/03/21 0900  BP: (!) 131/59 (!) 120/59 120/73   Pulse: (!) 111 85 99   Resp: 14  18 (!) 25  Temp: (!) 97.4 F (36.3 C)  97.8 F (36.6 C)   TempSrc: Oral  Oral   SpO2: 100% 100% 100%   Weight:      Height:       Weight change:   Intake/Output Summary (Last 24 hours) at 07/03/2021 1215 Last data filed at 07/03/2021 1000 Gross per 24 hour  Intake 1875 ml  Output 2150 ml  Net -275 ml    Assessment/ Plan: Pt is a 88 y.o. yo male with history of HTN on ACE-  s/p bladder tumor excision last week who was admitted on 07/01/2021 with weakness, diarrhea and A on CRF   Assessment/Plan: 1. A on CRF-  has baseline renal insufficiency crt 1.3 to 1.6 ( last 1.4 on 06/19/21)  presented 5/24 with crt 6.15 -  BP soft on ramipril- imaging does not show any hydro or kidney abnormality-  foley in place.  Thought to be volume depletion with procedure and diarrhea and probable ATN since has been on an ACE-  ACE on hold and has been hydrated with good results in crt lab-  UOP better-  - no signs of overload-  continue to hydrate 2. Metabolic acidosis-  on LR-  improving slowly  3. Hypokalemia-  has improved nicelly with repletion-  given another dose today   4. HTN/volume- ACE on hold-  getting IVF-  I would not resume ramipril-  I dont usually use it in elderly pts just due to their propensity toward volume depletion  5. Cdiff-  has now been proven-  on dificid  Given such great improvement in renal function I am going to sign off-  I anticipate continued improvement to baseline- continue fluids and potassium repletion as clinically appropriate-  call with any questions-  I would not resume ramipril on this patient  [place]: Basic Metabolic Panel: Recent Labs  Lab 07/01/21 1120 07/02/21 0630 07/03/21 0538  NA 136 138 139  K 2.9* 4.2 3.2*  CL 104 115* 117*  CO2 12* 14* 17*  GLUCOSE 159* 82 102*  BUN 100* 100* 68*  CREATININE 6.15* 4.59* 2.94*  CALCIUM 9.0 8.3* 7.9*   Liver Function Tests: Recent Labs  Lab 07/01/21 1120 07/02/21 0630 07/03/21 0538  AST '31 20 16  '$ ALT 74* 49* 34  ALKPHOS 36* 27* 24*  BILITOT 0.8 0.6 0.4  PROT 7.2 5.2* 4.4*  ALBUMIN 3.9 2.8* 2.4*   No results for input(s): LIPASE, AMYLASE in the last 168 hours. No results for input(s): AMMONIA in the last 168 hours. CBC: Recent Labs  Lab 07/01/21 1120 07/02/21 0630 07/03/21 0538  WBC 5.9 4.8 5.4  NEUTROABS 5.0  --   --   HGB 12.0* 9.5* 8.8*  HCT 38.3* 29.5* 27.6*  MCV 85.3 84.5 83.1  PLT 203 128* 110*   Cardiac Enzymes: No results for input(s): CKTOTAL, CKMB, CKMBINDEX, TROPONINI in the last 168 hours. CBG: No results for input(s): GLUCAP in the last 168 hours.  Iron Studies:  Recent Labs  07/03/21 0538  IRON 14*  TIBC 199*  FERRITIN 20*   Studies/Results: CT ABDOMEN PELVIS WO CONTRAST  Result Date: 07/01/2021 CLINICAL DATA:  Kidney failure, acute. Recent surgery for bladder cancer. Question infection or sepsis. EXAM: CT ABDOMEN AND PELVIS WITHOUT CONTRAST TECHNIQUE: Multidetector CT imaging of the abdomen and pelvis was performed following the standard protocol without IV contrast. RADIATION DOSE REDUCTION: This exam was performed according to the departmental dose-optimization program which includes automated exposure control, adjustment of the mA and/or kV according to patient size and/or use of iterative reconstruction technique. COMPARISON:  None FINDINGS: Lower chest: Mild scarring at the lung bases. No infiltrate, collapse or effusion. Some emphysematous changes. Hepatobiliary: Scattered calcified granulomas throughout the liver. No evidence of mass or biliary obstruction. Pancreas:  Normal Spleen: Normal Adrenals/Urinary Tract: Adrenal glands are normal. Kidneys are normal. No cyst, mass, stone or hydronephrosis. Catheter in place within the bladder. The bladder is largely collapsed. No complicating feature relating to recent surgery. Stomach/Bowel: Stomach and small intestine are normal. Normal appendix. No acute or significant: Finding. Diverticulosis of the sigmoid colon without visible diverticulitis. Vascular/Lymphatic: Aortic atherosclerosis. No aneurysm. IVC is normal. No adenopathy. Reproductive: Normal Other: No free fluid or air. Musculoskeletal: No significant finding. Ordinary osteoarthritis of the hip joints. IMPRESSION: Foley catheter within the bladder, which is largely collapsed. No complicating feature seen relating to recent bladder surgery. No other acute abdominal or pelvic organ finding. Diverticulosis of the sigmoid colon without visible diverticulitis. Aortic atherosclerosis. Electronically Signed   By: Nelson Chimes M.D.   On: 07/01/2021 16:16   Medications: Infusions:  lactated ringers 125 mL/hr at 07/03/21 1000    Scheduled Medications:  Chlorhexidine Gluconate Cloth  6 each Topical Daily   escitalopram  10 mg Oral Daily   feeding supplement  237 mL Oral TID BM   fidaxomicin  200 mg Oral BID   loratadine  10 mg Oral Daily   mouth rinse  15 mL Mouth Rinse BID   multivitamin with minerals  1 tablet Oral Q breakfast   rosuvastatin  20 mg Oral QHS   saccharomyces boulardii  250 mg Oral BID    have reviewed scheduled and prn medications.  Physical Exam: General: thin frail-  HOH-  wife at bedside Heart: RRR Lungs: mostly clear Abdomen: soft, non tender Extremities: no edema     07/03/2021,12:15 PM  LOS: 2 days

## 2021-07-03 NOTE — Progress Notes (Addendum)
PROGRESS NOTE  Gerald Valdez XHB:[id number] DOB: December 06, 1928 DOA: 07/01/2021 PCP: Prince Solian, MD   LOS: 2 days   Brief Narrative / Interim history: 88 year old male with history of bladder cancer status post tumor resection last week, COPD with chronic hypoxic respiratory failure on 3 L at home, hypertension, hyperlipidemia who presents to the hospital with weakness and diarrhea.  After his bladder tumor resection last week, started having a lot of weakness, poor appetite, nausea, dry heaves and profuse diarrhea.  He has been slowly declining and becoming weaker and weaker and she brought him to the hospital.  No reported fevers.  She also noticed that his urine output dropped significantly during that time.  She saw urology in office who directed him to come to the hospital  Subjective / 24h Interval events: Still having loose bowel movements but not as frequent as at home.  Appetite is still poor.  No abdominal pain, no nausea or vomiting.  No chest pain, no shortness of breath.  Assesement and Plan: Principal Problem:   Dehydration Active Problems:   COPD (chronic obstructive pulmonary disease) (HCC)   Essential hypertension   Hyperlipidemia   Cancer of overlapping sites of bladder ([place])   AKI (acute kidney injury) ([place])   Stage 3a chronic kidney disease (CKD) (HCC)   Protein-calorie malnutrition, moderate (HCC)   Diarrhea   UTI (urinary tract infection)   Sepsis (HCC)   Chronic respiratory failure with hypoxia (HCC)   Hyperglycemia   Normocytic anemia   Protein-calorie malnutrition, severe   Principal problem Sepsis due to C. difficile colitis/diarrhea-he met sepsis criteria with tachycardia, tachypnea.  He was also dehydrated.  C. difficile testing came back positive for toxigenic C. difficile presence.  Has been placed with Oxymycin, discussed with pharmacy they would be able to arrange for home fidaxomicin.  Continue.  Seems like he is slowing down.  GI pathogen panel  also positive for rotavirus A.  UTI has been ruled out, urine cultures have remained negative.  No longer on ceftriaxone  Active problems AKI on CKD3a -most recent creatinine ranging between 1.4-1.6, on admission it was 6.15.  Nephrology consulted.  Continue IV fluids, creatinine improving in the 2 range today.  UTI, recent instrumentation-uti ruled out  Diarrhea-monitor, C. difficile and GI pathogen panel pending  Bladder cancer status post tumor resection, placement of gemcitabine-urology consulted.  Due to urinary obstruction, has a chronic Foley.  Hypokalemia -replaced, potassium normalized this morning.  Magnesium normal  HTN-his blood pressure is normal this morning, hold home amlodipine, ramipril   Hyperlipidemia-continue statin  COPD, chronic hypoxia on 3 L at home-appears stable, no wheezing  Normocytic anemia-no bleeding, at baseline  Prediabetes-A1c 6.1   Scheduled Meds:  Chlorhexidine Gluconate Cloth  6 each Topical Daily   escitalopram  10 mg Oral Daily   feeding supplement  237 mL Oral TID BM   fidaxomicin  200 mg Oral BID   loratadine  10 mg Oral Daily   mouth rinse  15 mL Mouth Rinse BID   multivitamin with minerals  1 tablet Oral Q breakfast   rosuvastatin  20 mg Oral QHS   saccharomyces boulardii  250 mg Oral BID   Continuous Infusions:  lactated ringers 125 mL/hr at 07/03/21 1000   PRN Meds:.prochlorperazine  Diet Orders (From admission, onward)     Start     Ordered   07/02/21 1427  Diet regular Room service appropriate? Yes; Fluid consistency: Thin  Diet effective now  Question Answer Comment  Room service appropriate? Yes   Fluid consistency: Thin      07/02/21 1426            DVT prophylaxis: SCDs Start: 07/01/21 1720   Lab Results  Component Value Date   PLT 110 (L) 07/03/2021      Code Status: Full Code  Family Communication: wife at bedside   Status is: Inpatient  Remains inpatient appropriate because: AKI  Level  of care: Progressive  Consultants:  Urology Nephrology  Procedures:  none  Microbiology  pending  Antimicrobials: Ceftriaxone 5/24 >>    Objective: Vitals:   07/02/21 2258 07/02/21 2300 07/03/21 0538 07/03/21 0900  BP: (!) 131/59 (!) 120/59 120/73   Pulse: (!) 111 85 99   Resp: 14  18 (!) 25  Temp: (!) 97.4 F (36.3 C)  97.8 F (36.6 C)   TempSrc: Oral  Oral   SpO2: 100% 100% 100%   Weight:      Height:        Intake/Output Summary (Last 24 hours) at 07/03/2021 1217 Last data filed at 07/03/2021 1000 Gross per 24 hour  Intake 1875 ml  Output 2150 ml  Net -275 ml    Wt Readings from Last 3 Encounters:  07/01/21 55.2 kg  06/25/21 56.7 kg  06/19/21 62.1 kg    Examination:  Constitutional: NAD Eyes: lids and conjunctivae normal, no scleral icterus ENMT: mmm Neck: normal, supple Respiratory: clear to auscultation bilaterally, no wheezing, no crackles. Normal respiratory effort.  Cardiovascular: Regular rate and rhythm, no murmurs / rubs / gallops. No LE edema. Abdomen: soft, no distention, no tenderness. Bowel sounds positive.  Skin: no rashes Neurologic: no focal deficits, equal strength  Data Reviewed: I have independently reviewed following labs and imaging studies  CBC Recent Labs  Lab 07/01/21 1120 07/02/21 0630 07/03/21 0538  WBC 5.9 4.8 5.4  HGB 12.0* 9.5* 8.8*  HCT 38.3* 29.5* 27.6*  PLT 203 128* 110*  MCV 85.3 84.5 83.1  MCH 26.7 27.2 26.5  MCHC 31.3 32.2 31.9  RDW 15.3 15.4 15.4  LYMPHSABS 0.7  --   --   MONOABS 0.2  --   --   EOSABS 0.0  --   --   BASOSABS 0.0  --   --      Recent Labs  Lab 07/01/21 1120 07/01/21 1135 07/01/21 1320 07/01/21 1737 07/02/21 0630 07/03/21 0538  NA 136  --   --   --  138 139  K 2.9*  --   --   --  4.2 3.2*  CL 104  --   --   --  115* 117*  CO2 12*  --   --   --  14* 17*  GLUCOSE 159*  --   --   --  82 102*  BUN 100*  --   --   --  100* 68*  CREATININE 6.15*  --   --   --  4.59* 2.94*   CALCIUM 9.0  --   --   --  8.3* 7.9*  AST 31  --   --   --  20 16  ALT 74*  --   --   --  49* 34  ALKPHOS 36*  --   --   --  27* 24*  BILITOT 0.8  --   --   --  0.6 0.4  ALBUMIN 3.9  --   --   --  2.8* 2.4*  MG  --  2.3  --   --   --  1.8  PROCALCITON  --   --   --   --  0.11  --   LATICACIDVEN 2.6*  --  1.4  --   --   --   INR 1.1  --   --   --  1.2  --   HGBA1C  --   --   --  6.1*  --   --      ------------------------------------------------------------------------------------------------------------------ No results for input(s): CHOL, HDL, LDLCALC, TRIG, CHOLHDL, LDLDIRECT in the last 72 hours.  Lab Results  Component Value Date   HGBA1C 6.1 (H) 07/01/2021   ------------------------------------------------------------------------------------------------------------------ No results for input(s): TSH, T4TOTAL, T3FREE, THYROIDAB in the last 72 hours.  Invalid input(s): FREET3  Cardiac Enzymes No results for input(s): CKMB, TROPONINI, MYOGLOBIN in the last 168 hours.  Invalid input(s): CK ------------------------------------------------------------------------------------------------------------------    Component Value Date/Time   BNP 199.8 (H) 02/10/2020 1412    CBG: No results for input(s): GLUCAP in the last 168 hours.  Recent Results (from the past 240 hour(s))  Culture, blood (Routine x 2)     Status: None (Preliminary result)   Collection Time: 07/01/21 11:24 AM   Specimen: BLOOD  Result Value Ref Range Status   Specimen Description   Final    BLOOD LEFT ANTECUBITAL Performed at [facility] [address], [place], [place] [address]    Special Requests   Final    BOTTLES DRAWN AEROBIC AND ANAEROBIC Blood Culture adequate volume Performed at [facility] [address], [place], [place] [address]    Culture   Final    NO GROWTH 2 DAYS Performed at [facility] [address] [place], [place] [address]     Report Status PENDING  Incomplete  Culture, blood (Routine x 2)     Status: None (Preliminary result)   Collection Time: 07/01/21 11:25 AM   Specimen: BLOOD  Result Value Ref Range Status   Specimen Description   Final    BLOOD RIGHT ANTECUBITAL Performed at [facility] [address], [place], [place] [address]    Special Requests   Final    BOTTLES DRAWN AEROBIC AND ANAEROBIC Blood Culture adequate volume Performed at [facility] [address], [place], [place] [address]    Culture   Final    NO GROWTH 2 DAYS Performed at [facility] [address], [place], [place] [address]    Report Status PENDING  Incomplete  Urine Culture     Status: None   Collection Time: 07/01/21  2:41 PM   Specimen: Urine, Catheterized  Result Value Ref Range Status   Specimen Description   Final    URINE, CATHETERIZED Performed at [facility] [address] [place], [place] [address]    Special Requests   Final    NONE Performed at [facility], [place] [address], [place], [place] [address]    Culture   Final    NO GROWTH Performed at [facility], [place] [address] [place],  [address]    Report Status 07/02/2021 FINAL  Final  C Difficile Quick Screen w PCR reflex     Status: Abnormal   Collection Time: 07/02/21  7:21 AM   Specimen: Stool  Result Value Ref Range Status   C Diff antigen POSITIVE (A) NEGATIVE Final   C Diff toxin POSITIVE (A) NEGATIVE Final  C Diff interpretation Toxin producing C. difficile detected.  Final    Comment: CRITICAL RESULT CALLED TO, READ BACK BY AND VERIFIED WITH: SMITH,A. RN @1013  ON 5.25.2023 BY [facility] Performed at [facility], [place] [address] [place], [place] [address]   Gastrointestinal Panel by PCR , Stool     Status: Abnormal   Collection Time: 07/02/21  7:21 AM   Specimen: Stool  Result Value Ref Range Status   Campylobacter  species NOT DETECTED NOT DETECTED Final   Plesimonas shigelloides NOT DETECTED NOT DETECTED Final   Salmonella species NOT DETECTED NOT DETECTED Final   Yersinia enterocolitica NOT DETECTED NOT DETECTED Final   Vibrio species NOT DETECTED NOT DETECTED Final   Vibrio cholerae NOT DETECTED NOT DETECTED Final   Enteroaggregative E coli (EAEC) NOT DETECTED NOT DETECTED Final   Enteropathogenic E coli (EPEC) NOT DETECTED NOT DETECTED Final   Enterotoxigenic E coli (ETEC) NOT DETECTED NOT DETECTED Final   Shiga like toxin producing E coli (STEC) NOT DETECTED NOT DETECTED Final   Shigella/Enteroinvasive E coli (EIEC) NOT DETECTED NOT DETECTED Final   Cryptosporidium NOT DETECTED NOT DETECTED Final   Cyclospora cayetanensis NOT DETECTED NOT DETECTED Final   Entamoeba histolytica NOT DETECTED NOT DETECTED Final   Giardia lamblia NOT DETECTED NOT DETECTED Final   Adenovirus F40/41 NOT DETECTED NOT DETECTED Final   Astrovirus NOT DETECTED NOT DETECTED Final   Norovirus GI/GII NOT DETECTED NOT DETECTED Final   Rotavirus A DETECTED (A) NOT DETECTED Final    Comment: RESULT CALLED TO, READ BACK BY AND VERIFIED WITH: KIM BOYCE @0004  ON 07/03/21 SKL    Sapovirus (I, II, IV, and V) NOT DETECTED NOT DETECTED Final    Comment: Performed at [facility], [address], [place], [place] [address]     Radiology Studies: No results found.   Marzetta Board, MD, PhD [facility]  Between 7 am - 7 pm I am available, please contact me via Amion (for emergencies) or Securechat (non urgent messages)  Between 7 pm - 7 am I am not available, please contact night coverage MD/APP via Amion

## 2021-07-04 DIAGNOSIS — D509 Iron deficiency anemia, unspecified: Secondary | ICD-10-CM

## 2021-07-04 DIAGNOSIS — D696 Thrombocytopenia, unspecified: Secondary | ICD-10-CM

## 2021-07-04 DIAGNOSIS — E876 Hypokalemia: Secondary | ICD-10-CM

## 2021-07-04 DIAGNOSIS — E860 Dehydration: Secondary | ICD-10-CM | POA: Diagnosis not present

## 2021-07-04 LAB — BASIC METABOLIC PANEL
Anion gap: 4 — ABNORMAL LOW (ref 5–15)
BUN: 51 mg/dL — ABNORMAL HIGH (ref 8–23)
CO2: 22 mmol/L (ref 22–32)
Calcium: 7.8 mg/dL — ABNORMAL LOW (ref 8.9–10.3)
Chloride: 109 mmol/L (ref 98–111)
Creatinine, Ser: 1.81 mg/dL — ABNORMAL HIGH (ref 0.61–1.24)
GFR, Estimated: 35 mL/min — ABNORMAL LOW (ref >60)
Glucose, Bld: 109 mg/dL — ABNORMAL HIGH (ref 70–99)
Potassium: 3.7 mmol/L (ref 3.5–5.1)
Sodium: 135 mmol/L (ref 135–145)

## 2021-07-04 LAB — CBC
HCT: 25.4 % — ABNORMAL LOW (ref 39.0–52.0)
Hemoglobin: 8.2 g/dL — ABNORMAL LOW (ref 13.0–17.0)
MCH: 26.9 pg (ref 26.0–34.0)
MCHC: 32.3 g/dL (ref 30.0–36.0)
MCV: 83.3 fL (ref 80.0–100.0)
Platelets: 108 10*3/uL — ABNORMAL LOW (ref 150–400)
RBC: 3.05 MIL/uL — ABNORMAL LOW (ref 4.22–5.81)
RDW: 15.8 % — ABNORMAL HIGH (ref 11.5–15.5)
WBC: 6.7 10*3/uL (ref 4.0–10.5)
nRBC: 0 % (ref 0.0–0.2)

## 2021-07-04 NOTE — Progress Notes (Signed)
PROGRESS NOTE  Gerald Valdez:[id number] DOB: Sep 15, 1928 DOA: 07/01/2021 PCP: Prince Solian, MD   LOS: 3 days   Brief Narrative / Interim history: 88 year old male with history of bladder cancer status post tumor resection last week, COPD with chronic hypoxic respiratory failure on 3 L at home, hypertension, hyperlipidemia who presents to the hospital with weakness and diarrhea.  After his bladder tumor resection last week, started having a lot of weakness, poor appetite, nausea, dry heaves and profuse diarrhea.  He has been slowly declining and becoming weaker and weaker and she brought him to the hospital.  No reported fevers.  She also noticed that his urine output dropped significantly during that time.  She saw urology in office who directed him to come to the hospital  Subjective / 24h Interval events: Yesterday only had 1 bowel movement.  Feeling better today.  He is hungry.  Assesement and Plan: Principal Problem:   Dehydration Active Problems:   COPD (chronic obstructive pulmonary disease) (HCC)   Essential hypertension   Hyperlipidemia   Cancer of overlapping sites of bladder ([place])   AKI (acute kidney injury) ([place])   Stage 3a chronic kidney disease (CKD) (HCC)   Protein-calorie malnutrition, moderate (HCC)   Diarrhea   UTI (urinary tract infection)   Sepsis (HCC)   Chronic respiratory failure with hypoxia (HCC)   Hyperglycemia   Normocytic anemia   Protein-calorie malnutrition, severe   Hypokalemia   Thrombocytopenia (HCC)   Iron deficiency anemia   Principal problem Sepsis due to C. difficile colitis/diarrhea-he met sepsis criteria with tachycardia, tachypnea.  He was also dehydrated.  C. difficile testing came back positive for toxigenic C. difficile presence.  Has been placed on fidaxomycin, discussed with pharmacy they would be able to arrange for home fidaxomicin.  Continue.  Seems like he is slowing down.  GI pathogen panel also positive for rotavirus A.   UTI has been ruled out, urine cultures have remained negative.  No longer on ceftriaxone.  Diarrhea improving  Active problems AKI on CKD3a -most recent creatinine ranging between 1.4-1.6, on admission it was 6.15.  Nephrology consulted.  Continue IV fluids, creatinine improving in the 2 range today.  UTI, recent instrumentation-uti ruled out  Bladder cancer status post tumor resection, placement of gemcitabine-urology consulted.  Due to urinary obstruction, has a chronic Foley.  Hypokalemia -replaced, potassium normalized this morning.  Magnesium normal  HTN-blood pressure remains acceptable.  Hold amlodipine.  Ramipril will be discontinued altogether upon discharge   Hyperlipidemia-continue statin  COPD, chronic hypoxia on 3 L at home-appears stable, no wheezing  Normocytic anemia-no bleeding, at baseline  Prediabetes-A1c 6.1   Scheduled Meds:  Chlorhexidine Gluconate Cloth  6 each Topical Daily   escitalopram  10 mg Oral Daily   feeding supplement  237 mL Oral TID BM   fidaxomicin  200 mg Oral BID   loratadine  10 mg Oral Daily   mouth rinse  15 mL Mouth Rinse BID   multivitamin with minerals  1 tablet Oral Q breakfast   rosuvastatin  20 mg Oral QHS   saccharomyces boulardii  250 mg Oral BID   Continuous Infusions:  lactated ringers 125 mL/hr at 07/04/21 0937   PRN Meds:.prochlorperazine  Diet Orders (From admission, onward)     Start     Ordered   07/02/21 1427  Diet regular Room service appropriate? Yes; Fluid consistency: Thin  Diet effective now       Question Answer Comment  Room service appropriate?  Yes   Fluid consistency: Thin      07/02/21 1426            DVT prophylaxis: SCDs Start: 07/01/21 1720   Lab Results  Component Value Date   PLT 108 (L) 07/04/2021      Code Status: Full Code  Family Communication: wife at bedside   Status is: Inpatient  Remains inpatient appropriate because: AKI  Level of care: Progressive  Consultants:   Urology Nephrology  Procedures:  none  Microbiology  pending  Antimicrobials: Ceftriaxone 5/24 >>    Objective: Vitals:   07/03/21 1538 07/03/21 1800 07/03/21 2053 07/04/21 0625  BP: (!) 143/59  (!) 141/70 127/64  Pulse: 96  97 79  Resp: 18 (!) 22 18 19   Temp: (!) 97.2 F (36.2 C)  97.6 F (36.4 C) 98.1 F (36.7 C)  TempSrc: Oral  Oral Oral  SpO2: 100%  100% 100%  Weight:      Height:        Intake/Output Summary (Last 24 hours) at 07/04/2021 0941 Last data filed at 07/04/2021 9678 Gross per 24 hour  Intake 3305 ml  Output 1975 ml  Net 1330 ml    Wt Readings from Last 3 Encounters:  07/01/21 55.2 kg  06/25/21 56.7 kg  06/19/21 62.1 kg    Examination:  Constitutional: NAD Eyes: lids and conjunctivae normal, no scleral icterus ENMT: mmm Neck: normal, supple Respiratory: clear to auscultation bilaterally, no wheezing, no crackles. Normal respiratory effort.  Cardiovascular: Regular rate and rhythm, no murmurs / rubs / gallops.  Abdomen: soft, no distention, no tenderness. Bowel sounds positive.  Skin: no rashes Neurologic: no focal deficits, equal strength  Data Reviewed: I have independently reviewed following labs and imaging studies  CBC Recent Labs  Lab 07/01/21 1120 07/02/21 0630 07/03/21 0538 07/04/21 0449  WBC 5.9 4.8 5.4 6.7  HGB 12.0* 9.5* 8.8* 8.2*  HCT 38.3* 29.5* 27.6* 25.4*  PLT 203 128* 110* 108*  MCV 85.3 84.5 83.1 83.3  MCH 26.7 27.2 26.5 26.9  MCHC 31.3 32.2 31.9 32.3  RDW 15.3 15.4 15.4 15.8*  LYMPHSABS 0.7  --   --   --   MONOABS 0.2  --   --   --   EOSABS 0.0  --   --   --   BASOSABS 0.0  --   --   --      Recent Labs  Lab 07/01/21 1120 07/01/21 1135 07/01/21 1320 07/01/21 1737 07/02/21 0630 07/03/21 0538 07/04/21 0449  NA 136  --   --   --  138 139 135  K 2.9*  --   --   --  4.2 3.2* 3.7  CL 104  --   --   --  115* 117* 109  CO2 12*  --   --   --  14* 17* 22  GLUCOSE 159*  --   --   --  82 102* 109*  BUN  100*  --   --   --  100* 68* 51*  CREATININE 6.15*  --   --   --  4.59* 2.94* 1.81*  CALCIUM 9.0  --   --   --  8.3* 7.9* 7.8*  AST 31  --   --   --  20 16  --   ALT 74*  --   --   --  49* 34  --   ALKPHOS 36*  --   --   --  27*  24*  --   BILITOT 0.8  --   --   --  0.6 0.4  --   ALBUMIN 3.9  --   --   --  2.8* 2.4*  --   MG  --  2.3  --   --   --  1.8  --   PROCALCITON  --   --   --   --  0.11  --   --   LATICACIDVEN 2.6*  --  1.4  --   --   --   --   INR 1.1  --   --   --  1.2  --   --   HGBA1C  --   --   --  6.1*  --   --   --      ------------------------------------------------------------------------------------------------------------------ No results for input(s): CHOL, HDL, LDLCALC, TRIG, CHOLHDL, LDLDIRECT in the last 72 hours.  Lab Results  Component Value Date   HGBA1C 6.1 (H) 07/01/2021   ------------------------------------------------------------------------------------------------------------------ No results for input(s): TSH, T4TOTAL, T3FREE, THYROIDAB in the last 72 hours.  Invalid input(s): FREET3  Cardiac Enzymes No results for input(s): CKMB, TROPONINI, MYOGLOBIN in the last 168 hours.  Invalid input(s): CK ------------------------------------------------------------------------------------------------------------------    Component Value Date/Time   BNP 199.8 (H) 02/10/2020 1412    CBG: No results for input(s): GLUCAP in the last 168 hours.  Recent Results (from the past 240 hour(s))  Culture, blood (Routine x 2)     Status: None (Preliminary result)   Collection Time: 07/01/21 11:24 AM   Specimen: BLOOD  Result Value Ref Range Status   Specimen Description   Final    BLOOD LEFT ANTECUBITAL Performed at [facility] [address] [place], [place] [address]    Special Requests   Final    BOTTLES DRAWN AEROBIC AND ANAEROBIC Blood Culture adequate volume Performed at [facility] [address]  [place], [place] [address]    Culture   Final    NO GROWTH 3 DAYS Performed at [facility], [place] [address], [place], [place] [address]    Report Status PENDING  Incomplete  Culture, blood (Routine x 2)     Status: None (Preliminary result)   Collection Time: 07/01/21 11:25 AM   Specimen: BLOOD  Result Value Ref Range Status   Specimen Description   Final    BLOOD RIGHT ANTECUBITAL Performed at [facility] [address], [place], [place] [address]    Special Requests   Final    BOTTLES DRAWN AEROBIC AND ANAEROBIC Blood Culture adequate volume Performed at [facility] [address], [place], [place] [address]    Culture   Final    NO GROWTH 3 DAYS Performed at [facility], [place] [address] [place], [place] [address]    Report Status PENDING  Incomplete  Urine Culture     Status: None   Collection Time: 07/01/21  2:41 PM   Specimen: Urine, Catheterized  Result Value Ref Range Status   Specimen Description   Final    URINE, CATHETERIZED Performed at [facility] [address] [place], [place] [address]    Special Requests   Final    NONE Performed at [facility], [place] [address], [place], [place] [address]    Culture   Final    NO GROWTH Performed at [facility], [place] [address], [place], [place] [address]  Report Status 07/02/2021 FINAL  Final  C Difficile Quick Screen w PCR reflex     Status: Abnormal   Collection Time: 07/02/21  7:21 AM   Specimen: Stool  Result Value Ref Range Status   C Diff antigen POSITIVE (A) NEGATIVE Final   C Diff toxin POSITIVE (A) NEGATIVE Final   C Diff interpretation Toxin producing C. difficile detected.  Final    Comment: CRITICAL RESULT CALLED TO, READ BACK BY AND VERIFIED WITH: SMITH,A. RN @1013  ON 5.25.2023 BY [facility] Performed at [facility], [place] [address], [place], [place] [address]   Gastrointestinal Panel by PCR  , Stool     Status: Abnormal   Collection Time: 07/02/21  7:21 AM   Specimen: Stool  Result Value Ref Range Status   Campylobacter species NOT DETECTED NOT DETECTED Final   Plesimonas shigelloides NOT DETECTED NOT DETECTED Final   Salmonella species NOT DETECTED NOT DETECTED Final   Yersinia enterocolitica NOT DETECTED NOT DETECTED Final   Vibrio species NOT DETECTED NOT DETECTED Final   Vibrio cholerae NOT DETECTED NOT DETECTED Final   Enteroaggregative E coli (EAEC) NOT DETECTED NOT DETECTED Final   Enteropathogenic E coli (EPEC) NOT DETECTED NOT DETECTED Final   Enterotoxigenic E coli (ETEC) NOT DETECTED NOT DETECTED Final   Shiga like toxin producing E coli (STEC) NOT DETECTED NOT DETECTED Final   Shigella/Enteroinvasive E coli (EIEC) NOT DETECTED NOT DETECTED Final   Cryptosporidium NOT DETECTED NOT DETECTED Final   Cyclospora cayetanensis NOT DETECTED NOT DETECTED Final   Entamoeba histolytica NOT DETECTED NOT DETECTED Final   Giardia lamblia NOT DETECTED NOT DETECTED Final   Adenovirus F40/41 NOT DETECTED NOT DETECTED Final   Astrovirus NOT DETECTED NOT DETECTED Final   Norovirus GI/GII NOT DETECTED NOT DETECTED Final   Rotavirus A DETECTED (A) NOT DETECTED Final    Comment: RESULT CALLED TO, READ BACK BY AND VERIFIED WITH: KIM BOYCE @0004  ON 07/03/21 SKL    Sapovirus (I, II, IV, and V) NOT DETECTED NOT DETECTED Final    Comment: Performed at [facility], [address], [place], [place] [address]     Radiology Studies: No results found.   Marzetta Board, MD, PhD [facility]  Between 7 am - 7 pm I am available, please contact me via Amion (for emergencies) or Securechat (non urgent messages)  Between 7 pm - 7 am I am not available, please contact night coverage MD/APP via Amion

## 2021-07-04 NOTE — Evaluation (Signed)
Physical Therapy Evaluation Patient Details Name: Gerald Valdez MRN: [id number] DOB: 1928-12-07 Today's Date: 07/04/2021  History of Present Illness  88 year old male with history of bladder cancer status post tumor resection last week, COPD with chronic hypoxic respiratory failure on 3 L at home, hypertension, hyperlipidemia who presents to the hospital with weakness and diarrhea.  After his bladder tumor resection last week, started having a lot of weakness, poor appetite, nausea, dry heaves and profuse diarrhea. Dx of sepsis due to c-diff.  Clinical Impression  Pt admitted with above diagnosis. Pt ambulated 62' with RW, no loss of balance, on 3L O2. At baseline he ambulates without an assistive device and uses 3L O2 24/7. Rollator recommended to facilitate increased activity tolerance.  Pt currently with functional limitations due to the deficits listed below (see PT Problem List). Pt will benefit from skilled PT to increase their independence and safety with mobility to allow discharge to the venue listed below.          Recommendations for follow up therapy are one component of a multi-disciplinary discharge planning process, led by the attending physician.  Recommendations may be updated based on patient status, additional functional criteria and insurance authorization.  Follow Up Recommendations Home health PT    Assistance Recommended at Discharge Intermittent Supervision/Assistance  Patient can return home with the following  A little help with bathing/dressing/bathroom;Assistance with cooking/housework;Help with stairs or ramp for entrance;Assist for transportation    Equipment Recommendations Rollator (4 wheels)  Recommendations for Other Services       Functional Status Assessment Patient has had a recent decline in their functional status and demonstrates the ability to make significant improvements in function in a reasonable and predictable amount of time.      Precautions / Restrictions Precautions Precautions: Other (comment) Precaution Comments: on 3L O2 24/7 at baseline; pt denies falls in past 6 months Restrictions Weight Bearing Restrictions: No      Mobility  Bed Mobility Overal bed mobility: Needs Assistance Bed Mobility: Supine to Sit     Supine to sit: Min assist     General bed mobility comments: min A to raise trunk    Transfers Overall transfer level: Needs assistance Equipment used: Rolling walker (2 wheels) Transfers: Sit to/from Stand Sit to Stand: Min guard           General transfer comment: VCs hand placement    Ambulation/Gait Ambulation/Gait assistance: Min guard Gait Distance (Feet): 60 Feet Assistive device: Rolling walker (2 wheels) Gait Pattern/deviations: Step-through pattern, Decreased stride length, Trunk flexed Gait velocity: WFL     General Gait Details: VCs for positioning in RW, no loss of balance, ambulated with 3L O2, wheezing noted with walking (wife reports this is baseline)  Science writer    Modified Rankin (Stroke Patients Only)       Balance Overall balance assessment: Modified Independent                                           Pertinent Vitals/Pain Pain Assessment Pain Assessment: No/denies pain    Home Living Family/patient expects to be discharged to:: Private residence Living Arrangements: Spouse/significant other Available Help at Discharge: Family;Available 24 hours/day Type of Home: House Home Access: Ramped entrance       Home Layout: One level Home Equipment: Standard  Walker;Tub bench      Prior Function Prior Level of Function : Independent/Modified Independent             Mobility Comments: walks without AD, no falls in past 6 months       Hand Dominance        Extremity/Trunk Assessment   Upper Extremity Assessment Upper Extremity Assessment: Overall WFL for tasks assessed     Lower Extremity Assessment Lower Extremity Assessment: Overall WFL for tasks assessed    Cervical / Trunk Assessment Cervical / Trunk Assessment: Kyphotic  Communication   Communication: HOH  Cognition Arousal/Alertness: Awake/alert Behavior During Therapy: WFL for tasks assessed/performed Overall Cognitive Status: Within Functional Limits for tasks assessed                                          General Comments      Exercises     Assessment/Plan    PT Assessment Patient needs continued PT services  PT Problem List Decreased activity tolerance;Decreased balance;Decreased mobility       PT Treatment Interventions Gait training;Therapeutic exercise;Functional mobility training;Therapeutic activities    PT Goals (Current goals can be found in the Care Plan section)  Acute Rehab PT Goals Patient Stated Goal: return home, get stronger PT Goal Formulation: With patient/family Time For Goal Achievement: 07/18/21 Potential to Achieve Goals: Good    Frequency Min 3X/week     Co-evaluation               AM-PAC PT "6 Clicks" Mobility  Outcome Measure Help needed turning from your back to your side while in a flat bed without using bedrails?: None Help needed moving from lying on your back to sitting on the side of a flat bed without using bedrails?: A Little Help needed moving to and from a bed to a chair (including a wheelchair)?: A Little Help needed standing up from a chair using your arms (e.g., wheelchair or bedside chair)?: A Little Help needed to walk in hospital room?: A Little Help needed climbing 3-5 steps with a railing? : A Little 6 Click Score: 19    End of Session Equipment Utilized During Treatment: Gait belt;Oxygen Activity Tolerance: Patient tolerated treatment well Patient left: in chair;with call bell/phone within reach;with family/visitor present Nurse Communication: Mobility status PT Visit Diagnosis: Other abnormalities  of gait and mobility (R26.89)    Time: 1359-1420 PT Time Calculation (min) (ACUTE ONLY): 21 min   Charges:   PT Evaluation $PT Eval Moderate Complexity: 1 Mod         Philomena Doheny PT 07/04/2021  Acute Rehabilitation Services Pager [phone] Office [phone]

## 2021-07-05 DIAGNOSIS — E860 Dehydration: Secondary | ICD-10-CM | POA: Diagnosis not present

## 2021-07-05 LAB — BASIC METABOLIC PANEL
Anion gap: 5 (ref 5–15)
BUN: 35 mg/dL — ABNORMAL HIGH (ref 8–23)
CO2: 25 mmol/L (ref 22–32)
Calcium: 7.8 mg/dL — ABNORMAL LOW (ref 8.9–10.3)
Chloride: 104 mmol/L (ref 98–111)
Creatinine, Ser: 1.31 mg/dL — ABNORMAL HIGH (ref 0.61–1.24)
GFR, Estimated: 52 mL/min — ABNORMAL LOW (ref >60)
Glucose, Bld: 97 mg/dL (ref 70–99)
Potassium: 3.7 mmol/L (ref 3.5–5.1)
Sodium: 134 mmol/L — ABNORMAL LOW (ref 135–145)

## 2021-07-05 LAB — CBC
HCT: 26 % — ABNORMAL LOW (ref 39.0–52.0)
Hemoglobin: 8.2 g/dL — ABNORMAL LOW (ref 13.0–17.0)
MCH: 26.4 pg (ref 26.0–34.0)
MCHC: 31.5 g/dL (ref 30.0–36.0)
MCV: 83.6 fL (ref 80.0–100.0)
Platelets: 116 10*3/uL — ABNORMAL LOW (ref 150–400)
RBC: 3.11 MIL/uL — ABNORMAL LOW (ref 4.22–5.81)
RDW: 15.8 % — ABNORMAL HIGH (ref 11.5–15.5)
WBC: 7.3 10*3/uL (ref 4.0–10.5)
nRBC: 0 % (ref 0.0–0.2)

## 2021-07-05 LAB — MAGNESIUM: Magnesium: 1.6 mg/dL — ABNORMAL LOW (ref 1.7–2.4)

## 2021-07-05 MED ORDER — MAGNESIUM SULFATE 2 GM/50ML IV SOLN
2.0000 g | Freq: Once | INTRAVENOUS | Status: AC
  Administered 2021-07-05: 2 g via INTRAVENOUS
  Filled 2021-07-05: qty 50

## 2021-07-05 MED ORDER — SACCHAROMYCES BOULARDII 250 MG PO CAPS
250.0000 mg | ORAL_CAPSULE | Freq: Two times a day (BID) | ORAL | 0 refills | Status: DC
Start: 2021-07-05 — End: 2022-10-12

## 2021-07-05 MED ORDER — FIDAXOMICIN 200 MG PO TABS: 200.0000 mg | ORAL_TABLET | Freq: Two times a day (BID) | ORAL | Status: DC

## 2021-07-05 NOTE — Progress Notes (Signed)
Patient and wife, Stanton Kidney have been taught and explained discharge instructions. Patient and wife have no further questions at this time. IV of the left arm has been removed and site is clean, dry and intact.  Layla Maw, RN

## 2021-07-05 NOTE — Discharge Summary (Signed)
Physician Discharge Summary  Gerald Valdez JME:[id number] DOB: December 11, 1928 DOA: 07/01/2021  PCP: Prince Solian, MD  Admit date: 07/01/2021 Discharge date: 07/05/2021  Admitted From: home Disposition:  home  Recommendations for Outpatient Follow-up:  Follow up with PCP in 1-2 weeks  Home Health: PT Equipment/Devices: none  Discharge Condition: stable CODE STATUS: Full code Diet Orders (From admission, onward)     Start     Ordered   07/02/21 1427  Diet regular Room service appropriate? Yes; Fluid consistency: Thin  Diet effective now       Question Answer Comment  Room service appropriate? Yes   Fluid consistency: Thin      07/02/21 1426            HPI: Per admitting MD, Gerald Valdez is a 88 y.o. male with medical history significant of COPD, bladder CA s/p tumor resection, HTN, HLD. Presenting with weakness and diarrhea. He had a bladder tumor resection last week. 2 days after his procedure, his wife noticed that he was weak and didn't want to eat. He had nausea, dry heaves and diarrhea at the time. His PO intake has been poor since. He has not had any fevers, but his diarrhea has been persistent. She noticed that his UOP has dropped considerably during this time. She brought him to the urologist for a scheduled appointment this morning. They recommended that she come to the ED for evaluation. She denies any other aggravating or alleviating factors.    Hospital Course / Discharge diagnoses: Principal Problem:   Dehydration Active Problems:   COPD (chronic obstructive pulmonary disease) (HCC)   Essential hypertension   Hyperlipidemia   Cancer of overlapping sites of bladder ([place])   AKI (acute kidney injury) ([place])   Stage 3a chronic kidney disease (CKD) (HCC)   Protein-calorie malnutrition, moderate (HCC)   Diarrhea   UTI (urinary tract infection)   Sepsis (HCC)   Chronic respiratory failure with hypoxia (HCC)   Hyperglycemia   Normocytic anemia    Protein-calorie malnutrition, severe   Hypokalemia   Thrombocytopenia (HCC)   Iron deficiency anemia   Principal problem Sepsis due to C. difficile colitis/diarrhea-he met sepsis criteria with tachycardia, tachypnea.  He was also dehydrated.  C. difficile testing came back positive for toxigenic C. difficile presence.  Has been placed on fidaxomycin, discussed with pharmacy and he will be arranged for home fidaxomicin.  With treatment he improved, his diarrhea resolved, he is able to tolerate a regular diet and will be discharged home in stable condition.  Initially there was concern for UTI however that has been ruled out.  Active problems AKI on CKD3a -most recent creatinine ranging between 1.4-1.6, on admission it was 6.15.  Nephrology consulted.  He was placed on IV fluid, and with resolution of his diarrhea his creatinine returned to his baseline at 1.3.  UTI ruled out Bladder cancer status post tumor resection, placement of gemcitabine-urology consulted.  Due to urinary obstruction, has a chronic Foley. Hypokalemia -potassium normalized with repletion Hypomagnesemia-magnesium repleted prior to discharge.  He is eating and drinking well now HTN-blood pressure remains acceptable.  Hold amlodipine.  Ramipril will be discontinued altogether upon discharge Hyperlipidemia-continue statin COPD, chronic hypoxia on 3 L at home-appears stable, no wheezing Normocytic anemia-no bleeding, at baseline Prediabetes-A1c 6.1   Discharge Instructions   Allergies as of 07/05/2021   No Known Allergies      Medication List     STOP taking these medications    pantoprazole 40  MG tablet Commonly known as: Protonix   ramipril 10 MG capsule Commonly known as: ALTACE       TAKE these medications    amLODipine 5 MG tablet Commonly known as: NORVASC Take 5 mg by mouth daily with breakfast.   escitalopram 10 MG tablet Commonly known as: LEXAPRO Take 10 mg by mouth daily.   fidaxomicin  200 MG Tabs tablet Commonly known as: DIFICID Take 1 tablet (200 mg total) by mouth 2 (two) times daily.   loratadine 10 MG tablet Commonly known as: CLARITIN Take 10 mg by mouth daily.   multivitamin with minerals Tabs tablet Take 1 tablet by mouth daily with breakfast.   OXYGEN Inhale 3 L into the lungs continuous.   rosuvastatin 20 MG tablet Commonly known as: CRESTOR Take 20 mg by mouth at bedtime.   saccharomyces boulardii 250 MG capsule Commonly known as: FLORASTOR Take 1 capsule (250 mg total) by mouth 2 (two) times daily.               Durable Medical Equipment  (From admission, onward)           Start     Ordered   07/05/21 1015  For home use only DME 4 wheeled rolling walker with seat  Once       Question:  Patient needs a walker to treat with the following condition  Answer:  Unstable gait   07/05/21 1016            Follow-up Information     [facility] Follow up.   Specialty: Home Health Services Why: [facility] physical therapy Contact information: [place] [address] [phone]         [facility], [facility] Follow up.   Why: rollator Contact informationKarene Fry [place] [address] [phone]                 Consultations: Urology   Procedures/Studies:  CT ABDOMEN PELVIS WO CONTRAST  Result Date: 07/01/2021 CLINICAL DATA:  Kidney failure, acute. Recent surgery for bladder cancer. Question infection or sepsis. EXAM: CT ABDOMEN AND PELVIS WITHOUT CONTRAST TECHNIQUE: Multidetector CT imaging of the abdomen and pelvis was performed following the standard protocol without IV contrast. RADIATION DOSE REDUCTION: This exam was performed according to the departmental dose-optimization program which includes automated exposure control, adjustment of the mA and/or kV according to patient size and/or use of iterative reconstruction technique. COMPARISON:  None FINDINGS: Lower chest:  Mild scarring at the lung bases. No infiltrate, collapse or effusion. Some emphysematous changes. Hepatobiliary: Scattered calcified granulomas throughout the liver. No evidence of mass or biliary obstruction. Pancreas: Normal Spleen: Normal Adrenals/Urinary Tract: Adrenal glands are normal. Kidneys are normal. No cyst, mass, stone or hydronephrosis. Catheter in place within the bladder. The bladder is largely collapsed. No complicating feature relating to recent surgery. Stomach/Bowel: Stomach and small intestine are normal. Normal appendix. No acute or significant: Finding. Diverticulosis of the sigmoid colon without visible diverticulitis. Vascular/Lymphatic: Aortic atherosclerosis. No aneurysm. IVC is normal. No adenopathy. Reproductive: Normal Other: No free fluid or air. Musculoskeletal: No significant finding. Ordinary osteoarthritis of the hip joints. IMPRESSION: Foley catheter within the bladder, which is largely collapsed. No complicating feature seen relating to recent bladder surgery. No other acute abdominal or pelvic organ finding. Diverticulosis of the sigmoid colon without visible diverticulitis. Aortic atherosclerosis. Electronically Signed   By: Nelson Chimes M.D.   On: 07/01/2021 16:16   DG Chest  2 View  Result Date: 07/01/2021 CLINICAL DATA:  Suspected sepsis EXAM: CHEST - 2 VIEW COMPARISON:  02/10/2020 FINDINGS: Emphysema. No new consolidation or edema. No pleural effusion. Normal heart size. No acute osseous abnormality. IMPRESSION: No acute process in the chest. Electronically Signed   By: Macy Mis M.D.   On: 07/01/2021 11:58   DG C-Arm 1-60 Min-No Report  Result Date: 06/25/2021 Fluoroscopy was utilized by the requesting physician.  No radiographic interpretation.     Subjective: - no chest pain, shortness of breath, no abdominal pain, nausea or vomiting.   Discharge Exam: BP (!) 132/58 (BP Location: Right Arm)   Pulse 81   Temp 98 F (36.7 C)   Resp 16   Ht 5' 7"   (1.702 m)   Wt 55.2 kg   SpO2 100%   BMI 19.06 kg/m   General: Pt is alert, awake, not in acute distress Cardiovascular: RRR, S1/S2 +, no rubs, no gallops Respiratory: CTA bilaterally, no wheezing, no rhonchi Abdominal: Soft, NT, ND, bowel sounds + Extremities: no edema, no cyanosis   The results of significant diagnostics from this hospitalization (including imaging, microbiology, ancillary and laboratory) are listed below for reference.     Microbiology: Recent Results (from the past 240 hour(s))  Culture, blood (Routine x 2)     Status: None (Preliminary result)   Collection Time: 07/01/21 11:24 AM   Specimen: BLOOD  Result Value Ref Range Status   Specimen Description   Final    BLOOD LEFT ANTECUBITAL Performed at [facility] [address], [place], [place] [address]    Special Requests   Final    BOTTLES DRAWN AEROBIC AND ANAEROBIC Blood Culture adequate volume Performed at [facility] [address] [place], [place] [address]    Culture   Final    NO GROWTH 3 DAYS Performed at [facility], [place] [address], [place], [place] [address]    Report Status PENDING  Incomplete  Culture, blood (Routine x 2)     Status: None (Preliminary result)   Collection Time: 07/01/21 11:25 AM   Specimen: BLOOD  Result Value Ref Range Status   Specimen Description   Final    BLOOD RIGHT ANTECUBITAL Performed at [facility] [address] [place], [place] [address]    Special Requests   Final    BOTTLES DRAWN AEROBIC AND ANAEROBIC Blood Culture adequate volume Performed at [place] [facility] [address], [place], [place] [address]    Culture   Final    NO GROWTH 3 DAYS Performed at [facility], [place] [address], [place], [place] [address]    Report Status PENDING  Incomplete  Urine Culture     Status: None   Collection Time: 07/01/21  2:41 PM   Specimen: Urine, Catheterized  Result  Value Ref Range Status   Specimen Description   Final    URINE, CATHETERIZED Performed at [facility] [address] [place], [place] [address]    Special Requests   Final    NONE Performed at [facility], [place] [address], [place], [place] [address]    Culture   Final    NO GROWTH Performed at [facility], [place] [address], [place],  [address]    Report Status 07/02/2021 FINAL  Final  C Difficile Quick Screen w PCR reflex     Status: Abnormal   Collection Time: 07/02/21  7:21  AM   Specimen: Stool  Result Value Ref Range Status   C Diff antigen POSITIVE (A) NEGATIVE Final   C Diff toxin POSITIVE (A) NEGATIVE Final   C Diff interpretation Toxin producing C. difficile detected.  Final    Comment: CRITICAL RESULT CALLED TO, READ BACK BY AND VERIFIED WITH: SMITH,A. RN @1013  ON 5.25.2023 BY NMCCOY Performed at [facility], [place] [address] [place], [place] [address]   Gastrointestinal Panel by PCR , Stool     Status: Abnormal   Collection Time: 07/02/21  7:21 AM   Specimen: Stool  Result Value Ref Range Status   Campylobacter species NOT DETECTED NOT DETECTED Final   Plesimonas shigelloides NOT DETECTED NOT DETECTED Final   Salmonella species NOT DETECTED NOT DETECTED Final   Yersinia enterocolitica NOT DETECTED NOT DETECTED Final   Vibrio species NOT DETECTED NOT DETECTED Final   Vibrio cholerae NOT DETECTED NOT DETECTED Final   Enteroaggregative E coli (EAEC) NOT DETECTED NOT DETECTED Final   Enteropathogenic E coli (EPEC) NOT DETECTED NOT DETECTED Final   Enterotoxigenic E coli (ETEC) NOT DETECTED NOT DETECTED Final   Shiga like toxin producing E coli (STEC) NOT DETECTED NOT DETECTED Final   Shigella/Enteroinvasive E coli (EIEC) NOT DETECTED NOT DETECTED Final   Cryptosporidium NOT DETECTED NOT DETECTED Final   Cyclospora cayetanensis NOT DETECTED NOT DETECTED Final   Entamoeba histolytica NOT DETECTED NOT  DETECTED Final   Giardia lamblia NOT DETECTED NOT DETECTED Final   Adenovirus F40/41 NOT DETECTED NOT DETECTED Final   Astrovirus NOT DETECTED NOT DETECTED Final   Norovirus GI/GII NOT DETECTED NOT DETECTED Final   Rotavirus A DETECTED (A) NOT DETECTED Final    Comment: RESULT CALLED TO, READ BACK BY AND VERIFIED WITH: KIM BOYCE @0004  ON 07/03/21 SKL    Sapovirus (I, II, IV, and V) NOT DETECTED NOT DETECTED Final    Comment: Performed at [facility], [address] [place], [place] [address]     Labs: Basic Metabolic Panel: Recent Labs  Lab 07/01/21 1120 07/01/21 1135 07/02/21 0630 07/03/21 0538 07/04/21 0449 07/05/21 0443  NA 136  --  138 139 135 134*  K 2.9*  --  4.2 3.2* 3.7 3.7  CL 104  --  115* 117* 109 104  CO2 12*  --  14* 17* 22 25  GLUCOSE 159*  --  82 102* 109* 97  BUN 100*  --  100* 68* 51* 35*  CREATININE 6.15*  --  4.59* 2.94* 1.81* 1.31*  CALCIUM 9.0  --  8.3* 7.9* 7.8* 7.8*  MG  --  2.3  --  1.8  --  1.6*   Liver Function Tests: Recent Labs  Lab 07/01/21 1120 07/02/21 0630 07/03/21 0538  AST 31 20 16   ALT 74* 49* 34  ALKPHOS 36* 27* 24*  BILITOT 0.8 0.6 0.4  PROT 7.2 5.2* 4.4*  ALBUMIN 3.9 2.8* 2.4*   CBC: Recent Labs  Lab 07/01/21 1120 07/02/21 0630 07/03/21 0538 07/04/21 0449 07/05/21 0443  WBC 5.9 4.8 5.4 6.7 7.3  NEUTROABS 5.0  --   --   --   --   HGB 12.0* 9.5* 8.8* 8.2* 8.2*  HCT 38.3* 29.5* 27.6* 25.4* 26.0*  MCV 85.3 84.5 83.1 83.3 83.6  PLT 203 128* 110* 108* 116*   CBG: No results for input(s): GLUCAP in the last 168 hours. Hgb A1c No results for input(s): HGBA1C in the last 72 hours. Lipid Profile No results for input(s):  CHOL, HDL, LDLCALC, TRIG, CHOLHDL, LDLDIRECT in the last 72 hours. Thyroid function studies No results for input(s): TSH, T4TOTAL, T3FREE, THYROIDAB in the last 72 hours.  Invalid input(s): FREET3 Urinalysis    Component Value Date/Time   COLORURINE YELLOW 07/01/2021 1120   APPEARANCEUR  CLOUDY (A) 07/01/2021 1120   LABSPEC 1.018 07/01/2021 1120   PHURINE 5.0 07/01/2021 1120   GLUCOSEU NEGATIVE 07/01/2021 1120   HGBUR MODERATE (A) 07/01/2021 1120   BILIRUBINUR NEGATIVE 07/01/2021 1120   KETONESUR NEGATIVE 07/01/2021 1120   PROTEINUR 100 (A) 07/01/2021 1120   NITRITE NEGATIVE 07/01/2021 1120   LEUKOCYTESUR MODERATE (A) 07/01/2021 1120    FURTHER DISCHARGE INSTRUCTIONS:   Get Medicines reviewed and adjusted: Please take all your medications with you for your next visit with your Primary MD   Laboratory/radiological data: Please request your Primary MD to go over all hospital tests and procedure/radiological results at the follow up, please ask your Primary MD to get all Hospital records sent to his/her office.   In some cases, they will be blood work, cultures and biopsy results pending at the time of your discharge. Please request that your primary care M.D. goes through all the records of your hospital data and follows up on these results.   Also Note the following: If you experience worsening of your admission symptoms, develop shortness of breath, life threatening emergency, suicidal or homicidal thoughts you must seek medical attention immediately by calling 911 or calling your MD immediately  if symptoms less severe.   You must read complete instructions/literature along with all the possible adverse reactions/side effects for all the Medicines you take and that have been prescribed to you. Take any new Medicines after you have completely understood and accpet all the possible adverse reactions/side effects.    Do not drive when taking Pain medications or sleeping medications (Benzodaizepines)   Do not take more than prescribed Pain, Sleep and Anxiety Medications. It is not advisable to combine anxiety,sleep and pain medications without talking with your primary care practitioner   Special Instructions: If you have smoked or chewed Tobacco  in the last 2 yrs please  stop smoking, stop any regular Alcohol  and or any Recreational drug use.   Wear Seat belts while driving.   Please note: You were cared for by a hospitalist during your hospital stay. Once you are discharged, your primary care physician will handle any further medical issues. Please note that NO REFILLS for any discharge medications will be authorized once you are discharged, as it is imperative that you return to your primary care physician (or establish a relationship with a primary care physician if you do not have one) for your post hospital discharge needs so that they can reassess your need for medications and monitor your lab values.  Time coordinating discharge: 45 minutes  SIGNED:  Marzetta Board, MD, PhD 07/05/2021, 10:48 AM

## 2021-07-05 NOTE — TOC Progression Note (Signed)
Transition of Care [facility]) - Progression Note    Patient Details  Name: Gerald Valdez MRN: [id number] Date of Birth: [birth year]  [facility]) CM/SW Contact  Malynda Smolinski, Juliann Pulse, RN Phone Number: 07/05/2021, 10:22 AM  Clinical Narrative: spoke to dtr Antoine Poche about if mail order med has arrived in the home-meds has not arrived. HHPT ordered,rw ordered-agree to [facility] for HHPT;[facility] for rollator-to be delivered to rm prior d/c. Per attending no d/c today.     Expected Discharge Plan: [place]    Expected Discharge Plan and Services Expected Discharge Plan: [place]                           DME Agency: [employer] Date DME Agency Contacted: 07/05/21 Time DME Agency Contacted: 88 Representative spoke with at DME Agency: [place]: PT [place]: [place] Date [place]: 07/05/21 Time [place]: 61 Representative spoke with at [place]: cindie   Social Determinants of Health (Cent[facility]) Interventions    Readmission Risk Interventions     View : No data to display.

## 2021-07-06 DIAGNOSIS — I700 Atherosclerosis of aorta: Secondary | ICD-10-CM | POA: Diagnosis not present

## 2021-07-06 DIAGNOSIS — Z9981 Dependence on supplemental oxygen: Secondary | ICD-10-CM | POA: Diagnosis not present

## 2021-07-06 DIAGNOSIS — E785 Hyperlipidemia, unspecified: Secondary | ICD-10-CM | POA: Diagnosis not present

## 2021-07-06 DIAGNOSIS — D630 Anemia in neoplastic disease: Secondary | ICD-10-CM | POA: Diagnosis not present

## 2021-07-06 DIAGNOSIS — K219 Gastro-esophageal reflux disease without esophagitis: Secondary | ICD-10-CM | POA: Diagnosis not present

## 2021-07-06 DIAGNOSIS — J9611 Chronic respiratory failure with hypoxia: Secondary | ICD-10-CM | POA: Diagnosis not present

## 2021-07-06 DIAGNOSIS — A0472 Enterocolitis due to Clostridium difficile, not specified as recurrent: Secondary | ICD-10-CM | POA: Diagnosis not present

## 2021-07-06 DIAGNOSIS — E876 Hypokalemia: Secondary | ICD-10-CM | POA: Diagnosis not present

## 2021-07-06 DIAGNOSIS — D509 Iron deficiency anemia, unspecified: Secondary | ICD-10-CM | POA: Diagnosis not present

## 2021-07-06 DIAGNOSIS — I129 Hypertensive chronic kidney disease with stage 1 through stage 4 chronic kidney disease, or unspecified chronic kidney disease: Secondary | ICD-10-CM | POA: Diagnosis not present

## 2021-07-06 DIAGNOSIS — R739 Hyperglycemia, unspecified: Secondary | ICD-10-CM | POA: Diagnosis not present

## 2021-07-06 DIAGNOSIS — Z483 Aftercare following surgery for neoplasm: Secondary | ICD-10-CM | POA: Diagnosis not present

## 2021-07-06 DIAGNOSIS — C678 Malignant neoplasm of overlapping sites of bladder: Secondary | ICD-10-CM | POA: Diagnosis not present

## 2021-07-06 DIAGNOSIS — M1990 Unspecified osteoarthritis, unspecified site: Secondary | ICD-10-CM | POA: Diagnosis not present

## 2021-07-06 DIAGNOSIS — E43 Unspecified severe protein-calorie malnutrition: Secondary | ICD-10-CM | POA: Diagnosis not present

## 2021-07-06 DIAGNOSIS — N179 Acute kidney failure, unspecified: Secondary | ICD-10-CM | POA: Diagnosis not present

## 2021-07-06 DIAGNOSIS — Z792 Long term (current) use of antibiotics: Secondary | ICD-10-CM | POA: Diagnosis not present

## 2021-07-06 DIAGNOSIS — N1831 Chronic kidney disease, stage 3a: Secondary | ICD-10-CM | POA: Diagnosis not present

## 2021-07-06 DIAGNOSIS — D631 Anemia in chronic kidney disease: Secondary | ICD-10-CM | POA: Diagnosis not present

## 2021-07-06 DIAGNOSIS — D696 Thrombocytopenia, unspecified: Secondary | ICD-10-CM | POA: Diagnosis not present

## 2021-07-06 DIAGNOSIS — F419 Anxiety disorder, unspecified: Secondary | ICD-10-CM | POA: Diagnosis not present

## 2021-07-06 DIAGNOSIS — K269 Duodenal ulcer, unspecified as acute or chronic, without hemorrhage or perforation: Secondary | ICD-10-CM | POA: Diagnosis not present

## 2021-07-06 DIAGNOSIS — E860 Dehydration: Secondary | ICD-10-CM | POA: Diagnosis not present

## 2021-07-06 DIAGNOSIS — J439 Emphysema, unspecified: Secondary | ICD-10-CM | POA: Diagnosis not present

## 2021-07-06 LAB — CULTURE, BLOOD (ROUTINE X 2)
Culture: NO GROWTH
Culture: NO GROWTH
Special Requests: ADEQUATE
Special Requests: ADEQUATE

## 2021-07-06 LAB — ZINC: Zinc: 46 ug/dL (ref 44–115)

## 2021-07-08 DIAGNOSIS — R3914 Feeling of incomplete bladder emptying: Secondary | ICD-10-CM | POA: Diagnosis not present

## 2021-07-13 DIAGNOSIS — J441 Chronic obstructive pulmonary disease with (acute) exacerbation: Secondary | ICD-10-CM | POA: Diagnosis not present

## 2021-07-13 DIAGNOSIS — D649 Anemia, unspecified: Secondary | ICD-10-CM | POA: Diagnosis not present

## 2021-07-13 DIAGNOSIS — A0472 Enterocolitis due to Clostridium difficile, not specified as recurrent: Secondary | ICD-10-CM | POA: Diagnosis not present

## 2021-07-13 DIAGNOSIS — I700 Atherosclerosis of aorta: Secondary | ICD-10-CM | POA: Diagnosis not present

## 2021-07-13 DIAGNOSIS — N179 Acute kidney failure, unspecified: Secondary | ICD-10-CM | POA: Diagnosis not present

## 2021-07-13 DIAGNOSIS — I2510 Atherosclerotic heart disease of native coronary artery without angina pectoris: Secondary | ICD-10-CM | POA: Diagnosis not present

## 2021-07-13 DIAGNOSIS — E785 Hyperlipidemia, unspecified: Secondary | ICD-10-CM | POA: Diagnosis not present

## 2021-07-13 DIAGNOSIS — N1832 Chronic kidney disease, stage 3b: Secondary | ICD-10-CM | POA: Diagnosis not present

## 2021-07-13 DIAGNOSIS — I471 Supraventricular tachycardia: Secondary | ICD-10-CM | POA: Diagnosis not present

## 2021-07-13 DIAGNOSIS — C679 Malignant neoplasm of bladder, unspecified: Secondary | ICD-10-CM | POA: Diagnosis not present

## 2021-07-13 DIAGNOSIS — I129 Hypertensive chronic kidney disease with stage 1 through stage 4 chronic kidney disease, or unspecified chronic kidney disease: Secondary | ICD-10-CM | POA: Diagnosis not present

## 2021-07-13 DIAGNOSIS — J9601 Acute respiratory failure with hypoxia: Secondary | ICD-10-CM | POA: Diagnosis not present

## 2021-07-14 DIAGNOSIS — E860 Dehydration: Secondary | ICD-10-CM | POA: Diagnosis not present

## 2021-07-14 DIAGNOSIS — N179 Acute kidney failure, unspecified: Secondary | ICD-10-CM | POA: Diagnosis not present

## 2021-07-14 DIAGNOSIS — C678 Malignant neoplasm of overlapping sites of bladder: Secondary | ICD-10-CM | POA: Diagnosis not present

## 2021-07-14 DIAGNOSIS — A0472 Enterocolitis due to Clostridium difficile, not specified as recurrent: Secondary | ICD-10-CM | POA: Diagnosis not present

## 2021-07-14 DIAGNOSIS — I129 Hypertensive chronic kidney disease with stage 1 through stage 4 chronic kidney disease, or unspecified chronic kidney disease: Secondary | ICD-10-CM | POA: Diagnosis not present

## 2021-07-14 DIAGNOSIS — Z483 Aftercare following surgery for neoplasm: Secondary | ICD-10-CM | POA: Diagnosis not present

## 2021-07-15 DIAGNOSIS — D649 Anemia, unspecified: Secondary | ICD-10-CM | POA: Diagnosis not present

## 2021-07-15 DIAGNOSIS — R7989 Other specified abnormal findings of blood chemistry: Secondary | ICD-10-CM | POA: Diagnosis not present

## 2021-07-15 DIAGNOSIS — E785 Hyperlipidemia, unspecified: Secondary | ICD-10-CM | POA: Diagnosis not present

## 2021-07-17 DIAGNOSIS — A0472 Enterocolitis due to Clostridium difficile, not specified as recurrent: Secondary | ICD-10-CM | POA: Diagnosis not present

## 2021-07-17 DIAGNOSIS — I129 Hypertensive chronic kidney disease with stage 1 through stage 4 chronic kidney disease, or unspecified chronic kidney disease: Secondary | ICD-10-CM | POA: Diagnosis not present

## 2021-07-17 DIAGNOSIS — N179 Acute kidney failure, unspecified: Secondary | ICD-10-CM | POA: Diagnosis not present

## 2021-07-17 DIAGNOSIS — E860 Dehydration: Secondary | ICD-10-CM | POA: Diagnosis not present

## 2021-07-17 DIAGNOSIS — R3914 Feeling of incomplete bladder emptying: Secondary | ICD-10-CM | POA: Diagnosis not present

## 2021-07-17 DIAGNOSIS — Z483 Aftercare following surgery for neoplasm: Secondary | ICD-10-CM | POA: Diagnosis not present

## 2021-07-17 DIAGNOSIS — C678 Malignant neoplasm of overlapping sites of bladder: Secondary | ICD-10-CM | POA: Diagnosis not present

## 2021-07-17 NOTE — Patient Outreach (Signed)
Received a referral notification for Mr. Zeitler ---. I have assigned Raina Mina, RN to call for follow up and determine if there are any Case Management needs.    [facility], [place], [place] Management [phone]

## 2021-07-21 DIAGNOSIS — A0472 Enterocolitis due to Clostridium difficile, not specified as recurrent: Secondary | ICD-10-CM | POA: Diagnosis not present

## 2021-07-21 DIAGNOSIS — C678 Malignant neoplasm of overlapping sites of bladder: Secondary | ICD-10-CM | POA: Diagnosis not present

## 2021-07-21 DIAGNOSIS — N179 Acute kidney failure, unspecified: Secondary | ICD-10-CM | POA: Diagnosis not present

## 2021-07-21 DIAGNOSIS — E860 Dehydration: Secondary | ICD-10-CM | POA: Diagnosis not present

## 2021-07-21 DIAGNOSIS — I129 Hypertensive chronic kidney disease with stage 1 through stage 4 chronic kidney disease, or unspecified chronic kidney disease: Secondary | ICD-10-CM | POA: Diagnosis not present

## 2021-07-21 DIAGNOSIS — Z483 Aftercare following surgery for neoplasm: Secondary | ICD-10-CM | POA: Diagnosis not present

## 2021-07-23 DIAGNOSIS — C678 Malignant neoplasm of overlapping sites of bladder: Secondary | ICD-10-CM | POA: Diagnosis not present

## 2021-07-23 DIAGNOSIS — A0472 Enterocolitis due to Clostridium difficile, not specified as recurrent: Secondary | ICD-10-CM | POA: Diagnosis not present

## 2021-07-23 DIAGNOSIS — I129 Hypertensive chronic kidney disease with stage 1 through stage 4 chronic kidney disease, or unspecified chronic kidney disease: Secondary | ICD-10-CM | POA: Diagnosis not present

## 2021-07-23 DIAGNOSIS — N179 Acute kidney failure, unspecified: Secondary | ICD-10-CM | POA: Diagnosis not present

## 2021-07-23 DIAGNOSIS — E860 Dehydration: Secondary | ICD-10-CM | POA: Diagnosis not present

## 2021-07-23 DIAGNOSIS — Z483 Aftercare following surgery for neoplasm: Secondary | ICD-10-CM | POA: Diagnosis not present

## 2021-07-24 DIAGNOSIS — R3914 Feeling of incomplete bladder emptying: Secondary | ICD-10-CM | POA: Diagnosis not present

## 2021-07-24 DIAGNOSIS — C678 Malignant neoplasm of overlapping sites of bladder: Secondary | ICD-10-CM | POA: Diagnosis not present

## 2021-07-30 DIAGNOSIS — E860 Dehydration: Secondary | ICD-10-CM | POA: Diagnosis not present

## 2021-07-30 DIAGNOSIS — A0472 Enterocolitis due to Clostridium difficile, not specified as recurrent: Secondary | ICD-10-CM | POA: Diagnosis not present

## 2021-07-30 DIAGNOSIS — N179 Acute kidney failure, unspecified: Secondary | ICD-10-CM | POA: Diagnosis not present

## 2021-07-30 DIAGNOSIS — C678 Malignant neoplasm of overlapping sites of bladder: Secondary | ICD-10-CM | POA: Diagnosis not present

## 2021-07-30 DIAGNOSIS — Z483 Aftercare following surgery for neoplasm: Secondary | ICD-10-CM | POA: Diagnosis not present

## 2021-07-30 DIAGNOSIS — I129 Hypertensive chronic kidney disease with stage 1 through stage 4 chronic kidney disease, or unspecified chronic kidney disease: Secondary | ICD-10-CM | POA: Diagnosis not present

## 2021-08-03 DIAGNOSIS — N179 Acute kidney failure, unspecified: Secondary | ICD-10-CM | POA: Diagnosis not present

## 2021-08-03 DIAGNOSIS — C678 Malignant neoplasm of overlapping sites of bladder: Secondary | ICD-10-CM | POA: Diagnosis not present

## 2021-08-03 DIAGNOSIS — I129 Hypertensive chronic kidney disease with stage 1 through stage 4 chronic kidney disease, or unspecified chronic kidney disease: Secondary | ICD-10-CM | POA: Diagnosis not present

## 2021-08-03 DIAGNOSIS — Z483 Aftercare following surgery for neoplasm: Secondary | ICD-10-CM | POA: Diagnosis not present

## 2021-08-03 DIAGNOSIS — A0472 Enterocolitis due to Clostridium difficile, not specified as recurrent: Secondary | ICD-10-CM | POA: Diagnosis not present

## 2021-08-03 DIAGNOSIS — E860 Dehydration: Secondary | ICD-10-CM | POA: Diagnosis not present

## 2021-08-05 DIAGNOSIS — F419 Anxiety disorder, unspecified: Secondary | ICD-10-CM | POA: Diagnosis not present

## 2021-08-05 DIAGNOSIS — N1831 Chronic kidney disease, stage 3a: Secondary | ICD-10-CM | POA: Diagnosis not present

## 2021-08-05 DIAGNOSIS — D631 Anemia in chronic kidney disease: Secondary | ICD-10-CM | POA: Diagnosis not present

## 2021-08-05 DIAGNOSIS — K219 Gastro-esophageal reflux disease without esophagitis: Secondary | ICD-10-CM | POA: Diagnosis not present

## 2021-08-05 DIAGNOSIS — Z9981 Dependence on supplemental oxygen: Secondary | ICD-10-CM | POA: Diagnosis not present

## 2021-08-05 DIAGNOSIS — Z792 Long term (current) use of antibiotics: Secondary | ICD-10-CM | POA: Diagnosis not present

## 2021-08-05 DIAGNOSIS — E43 Unspecified severe protein-calorie malnutrition: Secondary | ICD-10-CM | POA: Diagnosis not present

## 2021-08-05 DIAGNOSIS — M1990 Unspecified osteoarthritis, unspecified site: Secondary | ICD-10-CM | POA: Diagnosis not present

## 2021-08-05 DIAGNOSIS — N179 Acute kidney failure, unspecified: Secondary | ICD-10-CM | POA: Diagnosis not present

## 2021-08-05 DIAGNOSIS — D630 Anemia in neoplastic disease: Secondary | ICD-10-CM | POA: Diagnosis not present

## 2021-08-05 DIAGNOSIS — R739 Hyperglycemia, unspecified: Secondary | ICD-10-CM | POA: Diagnosis not present

## 2021-08-05 DIAGNOSIS — C678 Malignant neoplasm of overlapping sites of bladder: Secondary | ICD-10-CM | POA: Diagnosis not present

## 2021-08-05 DIAGNOSIS — K269 Duodenal ulcer, unspecified as acute or chronic, without hemorrhage or perforation: Secondary | ICD-10-CM | POA: Diagnosis not present

## 2021-08-05 DIAGNOSIS — I129 Hypertensive chronic kidney disease with stage 1 through stage 4 chronic kidney disease, or unspecified chronic kidney disease: Secondary | ICD-10-CM | POA: Diagnosis not present

## 2021-08-05 DIAGNOSIS — I700 Atherosclerosis of aorta: Secondary | ICD-10-CM | POA: Diagnosis not present

## 2021-08-05 DIAGNOSIS — A0472 Enterocolitis due to Clostridium difficile, not specified as recurrent: Secondary | ICD-10-CM | POA: Diagnosis not present

## 2021-08-05 DIAGNOSIS — D509 Iron deficiency anemia, unspecified: Secondary | ICD-10-CM | POA: Diagnosis not present

## 2021-08-05 DIAGNOSIS — J9611 Chronic respiratory failure with hypoxia: Secondary | ICD-10-CM | POA: Diagnosis not present

## 2021-08-05 DIAGNOSIS — E860 Dehydration: Secondary | ICD-10-CM | POA: Diagnosis not present

## 2021-08-05 DIAGNOSIS — E876 Hypokalemia: Secondary | ICD-10-CM | POA: Diagnosis not present

## 2021-08-05 DIAGNOSIS — E785 Hyperlipidemia, unspecified: Secondary | ICD-10-CM | POA: Diagnosis not present

## 2021-08-05 DIAGNOSIS — J439 Emphysema, unspecified: Secondary | ICD-10-CM | POA: Diagnosis not present

## 2021-08-05 DIAGNOSIS — Z483 Aftercare following surgery for neoplasm: Secondary | ICD-10-CM | POA: Diagnosis not present

## 2021-08-05 DIAGNOSIS — D696 Thrombocytopenia, unspecified: Secondary | ICD-10-CM | POA: Diagnosis not present

## 2021-08-10 DIAGNOSIS — I129 Hypertensive chronic kidney disease with stage 1 through stage 4 chronic kidney disease, or unspecified chronic kidney disease: Secondary | ICD-10-CM | POA: Diagnosis not present

## 2021-08-10 DIAGNOSIS — E860 Dehydration: Secondary | ICD-10-CM | POA: Diagnosis not present

## 2021-08-10 DIAGNOSIS — N179 Acute kidney failure, unspecified: Secondary | ICD-10-CM | POA: Diagnosis not present

## 2021-08-10 DIAGNOSIS — A0472 Enterocolitis due to Clostridium difficile, not specified as recurrent: Secondary | ICD-10-CM | POA: Diagnosis not present

## 2021-08-10 DIAGNOSIS — Z483 Aftercare following surgery for neoplasm: Secondary | ICD-10-CM | POA: Diagnosis not present

## 2021-08-10 DIAGNOSIS — C678 Malignant neoplasm of overlapping sites of bladder: Secondary | ICD-10-CM | POA: Diagnosis not present

## 2021-08-12 DIAGNOSIS — R3914 Feeling of incomplete bladder emptying: Secondary | ICD-10-CM | POA: Diagnosis not present

## 2021-08-12 DIAGNOSIS — N3000 Acute cystitis without hematuria: Secondary | ICD-10-CM | POA: Diagnosis not present

## 2021-08-12 DIAGNOSIS — C678 Malignant neoplasm of overlapping sites of bladder: Secondary | ICD-10-CM | POA: Diagnosis not present

## 2021-08-13 DIAGNOSIS — D225 Melanocytic nevi of trunk: Secondary | ICD-10-CM | POA: Diagnosis not present

## 2021-08-13 DIAGNOSIS — L821 Other seborrheic keratosis: Secondary | ICD-10-CM | POA: Diagnosis not present

## 2021-08-13 DIAGNOSIS — Z08 Encounter for follow-up examination after completed treatment for malignant neoplasm: Secondary | ICD-10-CM | POA: Diagnosis not present

## 2021-08-13 DIAGNOSIS — Z85828 Personal history of other malignant neoplasm of skin: Secondary | ICD-10-CM | POA: Diagnosis not present

## 2021-08-13 DIAGNOSIS — L570 Actinic keratosis: Secondary | ICD-10-CM | POA: Diagnosis not present

## 2021-08-13 DIAGNOSIS — L814 Other melanin hyperpigmentation: Secondary | ICD-10-CM | POA: Diagnosis not present

## 2021-08-17 DIAGNOSIS — Z483 Aftercare following surgery for neoplasm: Secondary | ICD-10-CM | POA: Diagnosis not present

## 2021-08-17 DIAGNOSIS — E860 Dehydration: Secondary | ICD-10-CM | POA: Diagnosis not present

## 2021-08-17 DIAGNOSIS — N179 Acute kidney failure, unspecified: Secondary | ICD-10-CM | POA: Diagnosis not present

## 2021-08-17 DIAGNOSIS — A0472 Enterocolitis due to Clostridium difficile, not specified as recurrent: Secondary | ICD-10-CM | POA: Diagnosis not present

## 2021-08-17 DIAGNOSIS — I129 Hypertensive chronic kidney disease with stage 1 through stage 4 chronic kidney disease, or unspecified chronic kidney disease: Secondary | ICD-10-CM | POA: Diagnosis not present

## 2021-08-17 DIAGNOSIS — C678 Malignant neoplasm of overlapping sites of bladder: Secondary | ICD-10-CM | POA: Diagnosis not present

## 2021-08-19 DIAGNOSIS — N3000 Acute cystitis without hematuria: Secondary | ICD-10-CM | POA: Diagnosis not present

## 2021-08-19 DIAGNOSIS — C678 Malignant neoplasm of overlapping sites of bladder: Secondary | ICD-10-CM | POA: Diagnosis not present

## 2021-08-26 DIAGNOSIS — Z5111 Encounter for antineoplastic chemotherapy: Secondary | ICD-10-CM | POA: Diagnosis not present

## 2021-08-26 DIAGNOSIS — C678 Malignant neoplasm of overlapping sites of bladder: Secondary | ICD-10-CM | POA: Diagnosis not present

## 2021-09-02 DIAGNOSIS — C678 Malignant neoplasm of overlapping sites of bladder: Secondary | ICD-10-CM | POA: Diagnosis not present

## 2021-09-02 DIAGNOSIS — R8271 Bacteriuria: Secondary | ICD-10-CM | POA: Diagnosis not present

## 2021-09-07 DIAGNOSIS — R7989 Other specified abnormal findings of blood chemistry: Secondary | ICD-10-CM | POA: Diagnosis not present

## 2021-09-07 DIAGNOSIS — R634 Abnormal weight loss: Secondary | ICD-10-CM | POA: Diagnosis not present

## 2021-09-07 DIAGNOSIS — E785 Hyperlipidemia, unspecified: Secondary | ICD-10-CM | POA: Diagnosis not present

## 2021-09-07 DIAGNOSIS — R5383 Other fatigue: Secondary | ICD-10-CM | POA: Diagnosis not present

## 2021-09-07 DIAGNOSIS — D649 Anemia, unspecified: Secondary | ICD-10-CM | POA: Diagnosis not present

## 2021-09-07 DIAGNOSIS — I700 Atherosclerosis of aorta: Secondary | ICD-10-CM | POA: Diagnosis not present

## 2021-09-07 DIAGNOSIS — Z125 Encounter for screening for malignant neoplasm of prostate: Secondary | ICD-10-CM | POA: Diagnosis not present

## 2021-09-08 DIAGNOSIS — D649 Anemia, unspecified: Secondary | ICD-10-CM | POA: Diagnosis not present

## 2021-09-09 DIAGNOSIS — C678 Malignant neoplasm of overlapping sites of bladder: Secondary | ICD-10-CM | POA: Diagnosis not present

## 2021-09-09 DIAGNOSIS — Z5111 Encounter for antineoplastic chemotherapy: Secondary | ICD-10-CM | POA: Diagnosis not present

## 2021-09-14 DIAGNOSIS — D649 Anemia, unspecified: Secondary | ICD-10-CM | POA: Diagnosis not present

## 2021-09-14 DIAGNOSIS — J9601 Acute respiratory failure with hypoxia: Secondary | ICD-10-CM | POA: Diagnosis not present

## 2021-09-14 DIAGNOSIS — I129 Hypertensive chronic kidney disease with stage 1 through stage 4 chronic kidney disease, or unspecified chronic kidney disease: Secondary | ICD-10-CM | POA: Diagnosis not present

## 2021-09-14 DIAGNOSIS — N179 Acute kidney failure, unspecified: Secondary | ICD-10-CM | POA: Diagnosis not present

## 2021-09-14 DIAGNOSIS — C679 Malignant neoplasm of bladder, unspecified: Secondary | ICD-10-CM | POA: Diagnosis not present

## 2021-09-14 DIAGNOSIS — I2510 Atherosclerotic heart disease of native coronary artery without angina pectoris: Secondary | ICD-10-CM | POA: Diagnosis not present

## 2021-09-14 DIAGNOSIS — I700 Atherosclerosis of aorta: Secondary | ICD-10-CM | POA: Diagnosis not present

## 2021-09-14 DIAGNOSIS — I471 Supraventricular tachycardia: Secondary | ICD-10-CM | POA: Diagnosis not present

## 2021-09-14 DIAGNOSIS — E785 Hyperlipidemia, unspecified: Secondary | ICD-10-CM | POA: Diagnosis not present

## 2021-09-14 DIAGNOSIS — Z0000 Encounter for general adult medical examination without abnormal findings: Secondary | ICD-10-CM | POA: Diagnosis not present

## 2021-09-14 DIAGNOSIS — N1832 Chronic kidney disease, stage 3b: Secondary | ICD-10-CM | POA: Diagnosis not present

## 2021-09-14 DIAGNOSIS — R82998 Other abnormal findings in urine: Secondary | ICD-10-CM | POA: Diagnosis not present

## 2021-09-16 DIAGNOSIS — C679 Malignant neoplasm of bladder, unspecified: Secondary | ICD-10-CM | POA: Diagnosis not present

## 2021-09-16 DIAGNOSIS — R8271 Bacteriuria: Secondary | ICD-10-CM | POA: Diagnosis not present

## 2021-09-17 DIAGNOSIS — C44519 Basal cell carcinoma of skin of other part of trunk: Secondary | ICD-10-CM | POA: Diagnosis not present

## 2021-09-18 ENCOUNTER — Other Ambulatory Visit (HOSPITAL_COMMUNITY): Payer: Self-pay | Admitting: *Deleted

## 2021-09-21 ENCOUNTER — Ambulatory Visit (HOSPITAL_COMMUNITY)
Admission: RE | Admit: 2021-09-21 | Discharge: 2021-09-21 | Disposition: A | Discharge status: Home / Self Care | Payer: Medicare Other | Source: Ambulatory Visit | Attending: Internal Medicine | Admitting: Internal Medicine

## 2021-09-21 DIAGNOSIS — D649 Anemia, unspecified: Secondary | ICD-10-CM | POA: Insufficient documentation

## 2021-09-21 MED ORDER — FERUMOXYTOL INJECTION 510 MG/17 ML
510.0000 mg | Freq: Once | INTRAVENOUS | Status: AC
  Administered 2021-09-21: 510 mg via INTRAVENOUS
  Filled 2021-09-21: qty 510

## 2021-09-23 DIAGNOSIS — Z5111 Encounter for antineoplastic chemotherapy: Secondary | ICD-10-CM | POA: Diagnosis not present

## 2021-09-23 DIAGNOSIS — C678 Malignant neoplasm of overlapping sites of bladder: Secondary | ICD-10-CM | POA: Diagnosis not present

## 2021-09-30 DIAGNOSIS — C678 Malignant neoplasm of overlapping sites of bladder: Secondary | ICD-10-CM | POA: Diagnosis not present

## 2021-09-30 DIAGNOSIS — Z5111 Encounter for antineoplastic chemotherapy: Secondary | ICD-10-CM | POA: Diagnosis not present

## 2021-10-07 DIAGNOSIS — C678 Malignant neoplasm of overlapping sites of bladder: Secondary | ICD-10-CM | POA: Diagnosis not present

## 2021-10-07 DIAGNOSIS — Z5111 Encounter for antineoplastic chemotherapy: Secondary | ICD-10-CM | POA: Diagnosis not present

## 2021-11-18 DIAGNOSIS — C678 Malignant neoplasm of overlapping sites of bladder: Secondary | ICD-10-CM | POA: Diagnosis not present

## 2021-11-18 DIAGNOSIS — N3000 Acute cystitis without hematuria: Secondary | ICD-10-CM | POA: Diagnosis not present

## 2021-11-24 DIAGNOSIS — Z23 Encounter for immunization: Secondary | ICD-10-CM | POA: Diagnosis not present

## 2021-12-02 DIAGNOSIS — N3000 Acute cystitis without hematuria: Secondary | ICD-10-CM | POA: Diagnosis not present

## 2021-12-02 DIAGNOSIS — C678 Malignant neoplasm of overlapping sites of bladder: Secondary | ICD-10-CM | POA: Diagnosis not present

## 2021-12-03 ENCOUNTER — Other Ambulatory Visit: Payer: Self-pay | Admitting: Urology

## 2021-12-03 NOTE — Progress Notes (Signed)
For Short Stay: [place] appointment date: Date of COVID positive in last 63 days:  Bowel Prep reminder:   For Anesthesia: PCP - Prince Solian, MD  Cardiologist - Mertie Moores, MD  Chest x-ray - 07/01/21 in epic EKG - 07/01/21 SVT in epic Stress Test -  ECHO - 02/11/20 in epic Cardiac Cath -  Pacemaker/ICD device last checked: Pacemaker orders received: Device Rep notified:  Spinal Cord Stimulator:  Sleep Study -  CPAP -   Fasting Blood Sugar -  Checks Blood Sugar _____ times a day Date and result of last Hgb A1c-  Blood Thinner Instructions: Aspirin Instructions: Last Dose:  Activity level: Can go up a flight of stairs and activities of daily living without stopping and without chest pain and/or shortness of breath   Able to exercise without chest pain and/or shortness of breath   Unable to go up a flight of stairs without chest pain and/or shortness of breath     Anesthesia review: COPD O2 3L, CAD, HTN, CKD  Patient denies shortness of breath, fever, cough and chest pain at PAT appointment   Patient verbalized understanding of instructions that were given to them at the PAT appointment. Patient was also instructed that they will need to review over the PAT instructions again at home before surgery.

## 2021-12-07 ENCOUNTER — Encounter (HOSPITAL_COMMUNITY): Payer: Self-pay | Admitting: Urology

## 2021-12-07 ENCOUNTER — Other Ambulatory Visit: Payer: Self-pay

## 2021-12-07 NOTE — Progress Notes (Signed)
For Short Stay: COVID [facility] appointment date:  Yes Date of COVID positive in last 90 days:  No   For Anesthesia: PCP - Prince Solian, MD  Cardiologist - Mertie Moores, MD   Chest x-ray - 07/01/21 in epic EKG - 07/01/21 SVT in epic Stress Test -  ECHO - 02/11/20 in epic Cardiac Cath -  Pacemaker/ICD device last checked: Pacemaker orders received: Device Rep notified:   Spinal Cord Stimulator: N/A   Sleep Study - N/A CPAP -    Fasting Blood Sugar - N/A Checks Blood Sugar _____ times a day Date and result of last Hgb A1c-   Blood Thinner Instructions:  N/A Aspirin Instructions: Last Dose:   Activity level: Unable to climb stairs due to shortness of breath.  Able to perform activities of daily living without chest pain.  Patient is on O2 and has chronic shortness of breath.    Anesthesia review: COPD O2 3L, CAD, HTN, CKD   Patient denies fever, cough and chest pain at PAT appointment (completed over the phone)    Patient verbalized understanding of instructions that were given to them at the PAT appointment. Patient was also instructed that they will need to review over the PAT instructions again at home before surgery.

## 2021-12-08 NOTE — Progress Notes (Signed)
Anesthesia Chart Review   Case: [id number] Date/Time: 12/10/21 1315   Procedure: TRANSURETHRAL RESECTION OF BLADDER TUMOR WITH GEMCITABINE - 1 HR   Anesthesia type: Choice   Pre-op diagnosis: RECURRENT BLADDER CANCER   Location: [place] [facility]   Surgeons: Franchot Gallo, MD       DISCUSSION:88 y.o. former smoker with h/o HTN, CAD, CKD Stage III, COPD, recurrent bladder cancer scheduled for above procedure 12/10/2021 with Dr. Franchot Gallo.   Prior to procedure in May he was evaluated by cardiology.  Pt seen by cardiology 06/16/2021. Per OV note, "Preoperative cardiac evaluation: He is able to achieve > 4 METS activity, however his physical activity is limited by oxygen dependence. He denies chest pain with activity. Risk for MACE is low at 0.4%."  No anesthesia complications with procedure.  He was admitted after the procedure 5/24-5/28/2023 with sepsis due to C. Diff, dehydration. He had follow up with PCP 07/13/2021. Resolution of C diff, asymptomatic at this visit.   Anticipate pt can proceed with planned procedure barring acute status change and after evaluation DOS.  VS: Ht '5\' 7"'$  (1.702 m)   Wt 59.9 kg   BMI 20.67 kg/m   PROVIDERS: Avva, Ravisankar, MD is PCP    LABS: Labs reviewed: Acceptable for surgery. (all labs ordered are listed, but only abnormal results are displayed)  Labs Reviewed - No data to display   IMAGES:   EKG:   CV: Echo 02/11/2020 1. Left ventricular ejection fraction, by estimation, is 55 to 60%. The  left ventricle has normal function. The left ventricle has no regional  wall motion abnormalities. Left ventricular diastolic parameters are  consistent with Grade I diastolic  dysfunction (impaired relaxation).   2. Right ventricular systolic function is normal. The right ventricular  size is normal.   3. The mitral valve is normal in structure. No evidence of mitral valve  regurgitation. No evidence of mitral stenosis.   4. There  is a 1 cm x 0.8 cm calcified, mobile density in the LVOT between  the aortic valve and anterior mitral valve leaflet. Consider TEE to better  evaluate. The aortic valve is calcified. There is moderate calcification  of the aortic valve. There is  moderate thickening of the aortic valve. Aortic valve regurgitation is not  visualized. No aortic stenosis is present.   5. The inferior vena cava is normal in size with greater than 50%  respiratory variability, suggesting right atrial pressure of 3 mmHg.  Past Medical History:  Diagnosis Date   Acute duodenal ulcer with bleeding 01/30/2014   Acute post-hemorrhagic anemia 01/30/2014   Anxiety    Aortic atherosclerosis ([place]) 02/10/2020   Arthritis    Cancer ([place])    bladder   Chronic kidney disease    COPD exacerbation ([place]) 02/10/2020   Coronary artery disease    Dyspnea    Dyspnea on exertion 02/10/2020   Emphysema of lung ([place])    Essential hypertension 02/10/2020   GERD (gastroesophageal reflux disease) 02/10/2020   GI bleed 01/30/2014   Hyperlipidemia 02/10/2020    Past Surgical History:  Procedure Laterality Date   BLADDER SURGERY     CATARACT EXTRACTION W/ INTRAOCULAR LENS IMPLANT     CYSTOSCOPY W/ RETROGRADES Bilateral 11/20/2020   Procedure: CYSTOSCOPY WITH RETROGRADE PYELOGRAM;  Surgeon: Franchot Gallo, MD;  Location: [facility];  Service: Urology;  Laterality: Bilateral;   CYSTOSCOPY W/ RETROGRADES Right 02/12/2021   Procedure: CYSTOSCOPY WITH RETROGRADE PYELOGRAM;  Surgeon: Franchot Gallo, MD;  Location: [facility];  Service: Urology;  Laterality: Right;   CYSTOSCOPY W/ RETROGRADES Bilateral 06/25/2021   Procedure: CYSTOSCOPY WITH RETROGRADE PYELOGRAM;  Surgeon: Franchot Gallo, MD;  Location: [facility];  Service: Urology;  Laterality: Bilateral;   ESOPHAGOGASTRODUODENOSCOPY N/A 01/30/2014   Procedure: ESOPHAGOGASTRODUODENOSCOPY (EGD);  Surgeon: Inda Castle, MD;  Location: [name];  Service: Endoscopy;   Laterality: N/A;   TONSILLECTOMY     TRANSURETHRAL RESECTION OF BLADDER TUMOR N/A 02/12/2021   Procedure: REPEAT TRANSURETHRAL RESECTION OF BLADDER TUMOR (TURBT);  Surgeon: Franchot Gallo, MD;  Location: [facility];  Service: Urology;  Laterality: N/A;   TRANSURETHRAL RESECTION OF BLADDER TUMOR WITH MITOMYCIN-C N/A 11/20/2020   Procedure: TRANSURETHRAL RESECTION OF BLADDER TUMOR;  Surgeon: Franchot Gallo, MD;  Location: [facility];  Service: Urology;  Laterality: N/A;   TRANSURETHRAL RESECTION OF BLADDER TUMOR WITH MITOMYCIN-C N/A 06/25/2021   Procedure: TRANSURETHRAL RESECTION OF BLADDER TUMOR WITH GEMCITABINE;  Surgeon: Franchot Gallo, MD;  Location: [facility];  Service: Urology;  Laterality: N/A;  1 HR    MEDICATIONS: No current facility-administered medications for this encounter.    amLODipine (NORVASC) 5 MG tablet   ascorbic acid (VITAMIN C) 500 MG tablet   escitalopram (LEXAPRO) 10 MG tablet   ferrous sulfate 325 (65 FE) MG tablet   loratadine (CLARITIN) 10 MG tablet   Multiple Vitamin (MULTIVITAMIN WITH MINERALS) TABS tablet   nitrofurantoin, macrocrystal-monohydrate, (MACROBID) 100 MG capsule   OXYGEN   rosuvastatin (CRESTOR) 20 MG tablet   saccharomyces boulardii (FLORASTOR) 250 MG capsule    gemcitabine (GEMZAR) chemo syringe for bladder instillation 2,000 mg    Konrad Felix Ward, PA-C [facility] [phone]

## 2021-12-08 NOTE — H&P (View-Only) (Signed)
Anesthesia Chart Review   Case: [id number] Date/Time: 12/10/21 1315   Procedure: TRANSURETHRAL RESECTION OF BLADDER TUMOR WITH GEMCITABINE - 1 HR   Anesthesia type: Choice   Pre-op diagnosis: RECURRENT BLADDER CANCER   Location: [place] [facility] [facility]   Surgeons: Franchot Gallo, MD       DISCUSSION:88 y.o. former smoker with h/o HTN, CAD, CKD Stage III, COPD, recurrent bladder cancer scheduled for above procedure 12/10/2021 with Dr. Franchot Gallo.   Prior to procedure in May he was evaluated by cardiology.  Pt seen by cardiology 06/16/2021. Per OV note, "Preoperative cardiac evaluation: He is able to achieve > 4 METS activity, however his physical activity is limited by oxygen dependence. He denies chest pain with activity. Risk for MACE is low at 0.4%."  No anesthesia complications with procedure.  He was admitted after the procedure 5/24-5/28/2023 with sepsis due to C. Diff, dehydration. He had follow up with PCP 07/13/2021. Resolution of C diff, asymptomatic at this visit.   Anticipate pt can proceed with planned procedure barring acute status change and after evaluation DOS.  VS: Ht '5\' 7"'$  (1.702 m)   Wt 59.9 kg   BMI 20.67 kg/m   PROVIDERS: Avva, Ravisankar, MD is PCP    LABS: Labs reviewed: Acceptable for surgery. (all labs ordered are listed, but only abnormal results are displayed)  Labs Reviewed - No data to display   IMAGES:   EKG:   CV: Echo 02/11/2020 1. Left ventricular ejection fraction, by estimation, is 55 to 60%. The  left ventricle has normal function. The left ventricle has no regional  wall motion abnormalities. Left ventricular diastolic parameters are  consistent with Grade I diastolic  dysfunction (impaired relaxation).   2. Right ventricular systolic function is normal. The right ventricular  size is normal.   3. The mitral valve is normal in structure. No evidence of mitral valve  regurgitation. No evidence of mitral stenosis.   4. There  is a 1 cm x 0.8 cm calcified, mobile density in the LVOT between  the aortic valve and anterior mitral valve leaflet. Consider TEE to better  evaluate. The aortic valve is calcified. There is moderate calcification  of the aortic valve. There is  moderate thickening of the aortic valve. Aortic valve regurgitation is not  visualized. No aortic stenosis is present.   5. The inferior vena cava is normal in size with greater than 50%  respiratory variability, suggesting right atrial pressure of 3 mmHg.  Past Medical History:  Diagnosis Date   Acute duodenal ulcer with bleeding 01/30/2014   Acute post-hemorrhagic anemia 01/30/2014   Anxiety    Aortic atherosclerosis ([place]) 02/10/2020   Arthritis    Cancer ([place])    bladder   Chronic kidney disease    COPD exacerbation ([place]) 02/10/2020   Coronary artery disease    Dyspnea    Dyspnea on exertion 02/10/2020   Emphysema of lung ([place])    Essential hypertension 02/10/2020   GERD (gastroesophageal reflux disease) 02/10/2020   GI bleed 01/30/2014   Hyperlipidemia 02/10/2020    Past Surgical History:  Procedure Laterality Date   BLADDER SURGERY     CATARACT EXTRACTION W/ INTRAOCULAR LENS IMPLANT     CYSTOSCOPY W/ RETROGRADES Bilateral 11/20/2020   Procedure: CYSTOSCOPY WITH RETROGRADE PYELOGRAM;  Surgeon: Franchot Gallo, MD;  Location[facility];  Service: Urology;  Laterality: Bilateral;   CYSTOSCOPY W/ RETROGRADES Right 02/12/2021   Procedure: CYSTOSCOPY WITH RETROGRADE PYELOGRAM;  Surgeon: Franchot Gallo, MD;  Location[facility];  Service: Urology;  Laterality: Right;   CYSTOSCOPY W/ RETROGRADES Bilateral 06/25/2021   Procedure: CYSTOSCOPY WITH RETROGRADE PYELOGRAM;  Surgeon: Franchot Gallo, MD;  Location[facility];  Service: Urology;  Laterality: Bilateral;   ESOPHAGOGASTRODUODENOSCOPY N/A 01/30/2014   Procedure: ESOPHAGOGASTRODUODENOSCOPY (EGD);  Surgeon: Inda Castle, MD;  Location: [name];  Service: Endoscopy;   Laterality: N/A;   TONSILLECTOMY     TRANSURETHRAL RESECTION OF BLADDER TUMOR N/A 02/12/2021   Procedure: REPEAT TRANSURETHRAL RESECTION OF BLADDER TUMOR (TURBT);  Surgeon: Franchot Gallo, MD;  Location[facility];  Service: Urology;  Laterality: N/A;   TRANSURETHRAL RESECTION OF BLADDER TUMOR WITH MITOMYCIN-C N/A 11/20/2020   Procedure: TRANSURETHRAL RESECTION OF BLADDER TUMOR;  Surgeon: Franchot Gallo, MD;  [facility];  Service: Urology;  Laterality: N/A;   TRANSURETHRAL RESECTION OF BLADDER TUMOR WITH MITOMYCIN-C N/A 06/25/2021   Procedure: TRANSURETHRAL RESECTION OF BLADDER TUMOR WITH GEMCITABINE;  Surgeon: Franchot Gallo, MD;  Location[facility];  Service: Urology;  Laterality: N/A;  1 HR    MEDICATIONS: No current facility-administered medications for this encounter.    amLODipine (NORVASC) 5 MG tablet   ascorbic acid (VITAMIN C) 500 MG tablet   escitalopram (LEXAPRO) 10 MG tablet   ferrous sulfate 325 (65 FE) MG tablet   loratadine (CLARITIN) 10 MG tablet   Multiple Vitamin (MULTIVITAMIN WITH MINERALS) TABS tablet   nitrofurantoin, macrocrystal-monohydrate, (MACROBID) 100 MG capsule   OXYGEN   rosuvastatin (CRESTOR) 20 MG tablet   saccharomyces boulardii (FLORASTOR) 250 MG capsule    gemcitabine (GEMZAR) chemo syringe for bladder instillation 2,000 mg    Konrad Felix Ward, PA-C [facility] [phone]

## 2021-12-10 ENCOUNTER — Encounter (HOSPITAL_COMMUNITY): Payer: Self-pay | Admitting: Urology

## 2021-12-10 ENCOUNTER — Other Ambulatory Visit: Payer: Self-pay

## 2021-12-10 ENCOUNTER — Encounter (HOSPITAL_COMMUNITY)
Admission: RE | Disposition: A | Discharge status: Home / Self Care | Payer: Self-pay | Source: Ambulatory Visit | Attending: Urology

## 2021-12-10 ENCOUNTER — Ambulatory Visit (HOSPITAL_COMMUNITY): Payer: Medicare Other | Admitting: Physician Assistant

## 2021-12-10 ENCOUNTER — Observation Stay (HOSPITAL_COMMUNITY)
Admission: RE | Admit: 2021-12-10 | Discharge: 2021-12-11 | Disposition: A | Discharge status: Home / Self Care | Payer: Medicare Other | Source: Ambulatory Visit | Attending: Urology | Admitting: Urology

## 2021-12-10 ENCOUNTER — Ambulatory Visit (HOSPITAL_BASED_OUTPATIENT_CLINIC_OR_DEPARTMENT_OTHER): Payer: Medicare Other | Admitting: Physician Assistant

## 2021-12-10 DIAGNOSIS — D630 Anemia in neoplastic disease: Secondary | ICD-10-CM | POA: Diagnosis not present

## 2021-12-10 DIAGNOSIS — N183 Chronic kidney disease, stage 3 unspecified: Secondary | ICD-10-CM | POA: Insufficient documentation

## 2021-12-10 DIAGNOSIS — Z79899 Other long term (current) drug therapy: Secondary | ICD-10-CM | POA: Diagnosis not present

## 2021-12-10 DIAGNOSIS — J441 Chronic obstructive pulmonary disease with (acute) exacerbation: Secondary | ICD-10-CM | POA: Diagnosis not present

## 2021-12-10 DIAGNOSIS — I129 Hypertensive chronic kidney disease with stage 1 through stage 4 chronic kidney disease, or unspecified chronic kidney disease: Secondary | ICD-10-CM

## 2021-12-10 DIAGNOSIS — C678 Malignant neoplasm of overlapping sites of bladder: Secondary | ICD-10-CM | POA: Diagnosis not present

## 2021-12-10 DIAGNOSIS — Z87891 Personal history of nicotine dependence: Secondary | ICD-10-CM | POA: Diagnosis not present

## 2021-12-10 DIAGNOSIS — N1831 Chronic kidney disease, stage 3a: Secondary | ICD-10-CM

## 2021-12-10 DIAGNOSIS — I2510 Atherosclerotic heart disease of native coronary artery without angina pectoris: Secondary | ICD-10-CM | POA: Insufficient documentation

## 2021-12-10 DIAGNOSIS — D509 Iron deficiency anemia, unspecified: Secondary | ICD-10-CM

## 2021-12-10 DIAGNOSIS — C679 Malignant neoplasm of bladder, unspecified: Secondary | ICD-10-CM | POA: Diagnosis not present

## 2021-12-10 DIAGNOSIS — D494 Neoplasm of unspecified behavior of bladder: Secondary | ICD-10-CM | POA: Diagnosis not present

## 2021-12-10 DIAGNOSIS — D631 Anemia in chronic kidney disease: Secondary | ICD-10-CM

## 2021-12-10 DIAGNOSIS — N189 Chronic kidney disease, unspecified: Secondary | ICD-10-CM | POA: Diagnosis not present

## 2021-12-10 DIAGNOSIS — I10 Essential (primary) hypertension: Secondary | ICD-10-CM

## 2021-12-10 HISTORY — PX: TRANSURETHRAL RESECTION OF BLADDER TUMOR WITH MITOMYCIN-C: SHX6459

## 2021-12-10 LAB — BASIC METABOLIC PANEL
Anion gap: 9 (ref 5–15)
BUN: 24 mg/dL — ABNORMAL HIGH (ref 8–23)
CO2: 31 mmol/L (ref 22–32)
Calcium: 9 mg/dL (ref 8.9–10.3)
Chloride: 99 mmol/L (ref 98–111)
Creatinine, Ser: 1.33 mg/dL — ABNORMAL HIGH (ref 0.61–1.24)
GFR, Estimated: 50 mL/min — ABNORMAL LOW (ref >60)
Glucose, Bld: 110 mg/dL — ABNORMAL HIGH (ref 70–99)
Potassium: 4.3 mmol/L (ref 3.5–5.1)
Sodium: 139 mmol/L (ref 135–145)

## 2021-12-10 LAB — CBC
HCT: 38.5 % — ABNORMAL LOW (ref 39.0–52.0)
Hemoglobin: 11.5 g/dL — ABNORMAL LOW (ref 13.0–17.0)
MCH: 28.9 pg (ref 26.0–34.0)
MCHC: 29.9 g/dL — ABNORMAL LOW (ref 30.0–36.0)
MCV: 96.7 fL (ref 80.0–100.0)
Platelets: 190 10*3/uL (ref 150–400)
RBC: 3.98 MIL/uL — ABNORMAL LOW (ref 4.22–5.81)
RDW: 14.3 % (ref 11.5–15.5)
WBC: 8.3 10*3/uL (ref 4.0–10.5)
nRBC: 0 % (ref 0.0–0.2)

## 2021-12-10 SURGERY — TRANSURETHRAL RESECTION OF BLADDER TUMOR WITH MITOMYCIN-C
Anesthesia: General

## 2021-12-10 MED ORDER — STERILE WATER FOR IRRIGATION IR SOLN
Status: DC | PRN
  Administered 2021-12-10: 1000 mL

## 2021-12-10 MED ORDER — FENTANYL CITRATE (PF) 100 MCG/2ML IJ SOLN
INTRAMUSCULAR | Status: AC
  Filled 2021-12-10: qty 2

## 2021-12-10 MED ORDER — PROPOFOL 10 MG/ML IV BOLUS
INTRAVENOUS | Status: AC
  Filled 2021-12-10: qty 20

## 2021-12-10 MED ORDER — ESCITALOPRAM OXALATE 10 MG PO TABS
10.0000 mg | ORAL_TABLET | Freq: Every day | ORAL | Status: DC
  Administered 2021-12-10 – 2021-12-11 (×2): 10 mg via ORAL
  Filled 2021-12-10 (×2): qty 1

## 2021-12-10 MED ORDER — GEMCITABINE CHEMO FOR BLADDER INSTILLATION 2000 MG
2000.0000 mg | Freq: Once | INTRAVENOUS | Status: AC
  Administered 2021-12-10: 2000 mg via INTRAVESICAL
  Filled 2021-12-10: qty 2000

## 2021-12-10 MED ORDER — ROSUVASTATIN CALCIUM 20 MG PO TABS
20.0000 mg | ORAL_TABLET | Freq: Every day | ORAL | Status: DC
  Administered 2021-12-10: 20 mg via ORAL
  Filled 2021-12-10: qty 1

## 2021-12-10 MED ORDER — OXYCODONE HCL 5 MG PO TABS: 5.0000 mg | ORAL_TABLET | Freq: Once | ORAL | Status: DC | PRN

## 2021-12-10 MED ORDER — LORATADINE 10 MG PO TABS
10.0000 mg | ORAL_TABLET | Freq: Every day | ORAL | Status: DC
  Administered 2021-12-10 – 2021-12-11 (×2): 10 mg via ORAL
  Filled 2021-12-10 (×2): qty 1

## 2021-12-10 MED ORDER — LIDOCAINE 2% (20 MG/ML) 5 ML SYRINGE
INTRAMUSCULAR | Status: DC | PRN
  Administered 2021-12-10: 60 mg via INTRAVENOUS

## 2021-12-10 MED ORDER — SENNOSIDES-DOCUSATE SODIUM 8.6-50 MG PO TABS
2.0000 | ORAL_TABLET | Freq: Every day | ORAL | Status: DC
  Administered 2021-12-10: 2 via ORAL
  Filled 2021-12-10: qty 2

## 2021-12-10 MED ORDER — GEMCITABINE CHEMO FOR BLADDER INSTILLATION 2000 MG: 2000.0000 mg | Freq: Once | INTRAVENOUS | Status: DC

## 2021-12-10 MED ORDER — AMLODIPINE BESYLATE 5 MG PO TABS
5.0000 mg | ORAL_TABLET | Freq: Every day | ORAL | Status: DC
  Administered 2021-12-11: 5 mg via ORAL
  Filled 2021-12-10: qty 1

## 2021-12-10 MED ORDER — DEXAMETHASONE SODIUM PHOSPHATE 10 MG/ML IJ SOLN
INTRAMUSCULAR | Status: DC | PRN
  Administered 2021-12-10: 10 mg via INTRAVENOUS

## 2021-12-10 MED ORDER — SODIUM CHLORIDE 0.9 % IR SOLN
Status: DC | PRN
  Administered 2021-12-10: 12000 mL via INTRAVESICAL

## 2021-12-10 MED ORDER — HYDROCODONE-ACETAMINOPHEN 5-325 MG PO TABS: 1.0000 | ORAL_TABLET | ORAL | Status: DC | PRN

## 2021-12-10 MED ORDER — ACETAMINOPHEN 160 MG/5ML PO SOLN: 325.0000 mg | ORAL | Status: DC | PRN

## 2021-12-10 MED ORDER — LACTATED RINGERS IV SOLN: INTRAVENOUS | Status: DC

## 2021-12-10 MED ORDER — ONDANSETRON HCL 4 MG/2ML IJ SOLN: 4.0000 mg | Freq: Once | INTRAMUSCULAR | Status: DC | PRN

## 2021-12-10 MED ORDER — 0.9 % SODIUM CHLORIDE (POUR BTL) OPTIME
TOPICAL | Status: DC | PRN
  Administered 2021-12-10: 1000 mL

## 2021-12-10 MED ORDER — ACETAMINOPHEN 325 MG PO TABS: 650.0000 mg | ORAL_TABLET | ORAL | Status: DC | PRN

## 2021-12-10 MED ORDER — CEFAZOLIN SODIUM-DEXTROSE 2-4 GM/100ML-% IV SOLN
2.0000 g | INTRAVENOUS | Status: AC
  Administered 2021-12-10: 2 g via INTRAVENOUS
  Filled 2021-12-10: qty 100

## 2021-12-10 MED ORDER — SODIUM CHLORIDE 0.45 % IV SOLN: INTRAVENOUS | Status: DC

## 2021-12-10 MED ORDER — FENTANYL CITRATE PF 50 MCG/ML IJ SOSY
25.0000 ug | PREFILLED_SYRINGE | INTRAMUSCULAR | Status: DC | PRN
  Administered 2021-12-10: 50 ug via INTRAVENOUS

## 2021-12-10 MED ORDER — ONDANSETRON HCL 4 MG/2ML IJ SOLN
INTRAMUSCULAR | Status: DC | PRN
  Administered 2021-12-10: 4 mg via INTRAVENOUS

## 2021-12-10 MED ORDER — FENTANYL CITRATE PF 50 MCG/ML IJ SOSY
PREFILLED_SYRINGE | INTRAMUSCULAR | Status: AC
  Filled 2021-12-10: qty 1

## 2021-12-10 MED ORDER — CHLORHEXIDINE GLUCONATE 0.12 % MT SOLN
15.0000 mL | Freq: Once | OROMUCOSAL | Status: AC
  Administered 2021-12-10: 15 mL via OROMUCOSAL

## 2021-12-10 MED ORDER — GENTAMICIN SULFATE 40 MG/ML IJ SOLN
5.0000 mg/kg | Freq: Once | INTRAVENOUS | Status: AC
  Administered 2021-12-10: 300 mg via INTRAVENOUS
  Filled 2021-12-10: qty 7.5

## 2021-12-10 MED ORDER — MEPERIDINE HCL 50 MG/ML IJ SOLN: 6.2500 mg | INTRAMUSCULAR | Status: DC | PRN

## 2021-12-10 MED ORDER — OXYCODONE HCL 5 MG/5ML PO SOLN: 5.0000 mg | Freq: Once | ORAL | Status: DC | PRN

## 2021-12-10 MED ORDER — PROPOFOL 10 MG/ML IV BOLUS
INTRAVENOUS | Status: DC | PRN
  Administered 2021-12-10: 100 mg via INTRAVENOUS

## 2021-12-10 MED ORDER — ACETAMINOPHEN 325 MG PO TABS: 325.0000 mg | ORAL_TABLET | ORAL | Status: DC | PRN

## 2021-12-10 MED ORDER — FERROUS SULFATE 325 (65 FE) MG PO TABS
325.0000 mg | ORAL_TABLET | Freq: Every day | ORAL | Status: DC
  Administered 2021-12-11: 325 mg via ORAL
  Filled 2021-12-10: qty 1

## 2021-12-10 MED ORDER — PHENYLEPHRINE 80 MCG/ML (10ML) SYRINGE FOR IV PUSH (FOR BLOOD PRESSURE SUPPORT)
PREFILLED_SYRINGE | INTRAVENOUS | Status: DC | PRN
  Administered 2021-12-10 (×2): 160 ug via INTRAVENOUS

## 2021-12-10 MED ORDER — FENTANYL CITRATE (PF) 100 MCG/2ML IJ SOLN
INTRAMUSCULAR | Status: DC | PRN
  Administered 2021-12-10 (×2): 50 ug via INTRAVENOUS

## 2021-12-10 MED ORDER — ORAL CARE MOUTH RINSE: 15.0000 mL | Freq: Once | OROMUCOSAL | Status: AC

## 2021-12-10 SURGICAL SUPPLY — 23 items
BAG DRN RND TRDRP ANRFLXCHMBR (UROLOGICAL SUPPLIES) ×1
BAG URINE DRAIN 2000ML AR STRL (UROLOGICAL SUPPLIES) IMPLANT
BAG URO CATCHER STRL LF (MISCELLANEOUS) ×1 IMPLANT
CATH FOLEY 2WAY SLVR  5CC 20FR (CATHETERS) ×1
CATH FOLEY 2WAY SLVR 30CC 24FR (CATHETERS) IMPLANT
CATH FOLEY 2WAY SLVR 5CC 20FR (CATHETERS) IMPLANT
DRAPE FOOT SWITCH (DRAPES) ×1 IMPLANT
EVACUATOR MICROVAS BLADDER (UROLOGICAL SUPPLIES) IMPLANT
GLOVE SURG LX STRL 8.0 MICRO (GLOVE) ×1 IMPLANT
GOWN STRL REUS W/ TWL XL LVL3 (GOWN DISPOSABLE) ×1 IMPLANT
GOWN STRL REUS W/TWL XL LVL3 (GOWN DISPOSABLE) ×1
HOLDER FOLEY CATH W/STRAP (MISCELLANEOUS) IMPLANT
KIT TURNOVER KIT A (KITS) IMPLANT
LOOP CUT BIPOLAR 24F LRG (ELECTROSURGICAL) ×1 IMPLANT
MANIFOLD NEPTUNE II (INSTRUMENTS) ×1 IMPLANT
NDL SAFETY ECLIP 18X1.5 (MISCELLANEOUS) ×1 IMPLANT
PACK CYSTO (CUSTOM PROCEDURE TRAY) ×1 IMPLANT
PLUG CATH AND CAP STER (CATHETERS) IMPLANT
SYR TOOMEY IRRIG 70ML (MISCELLANEOUS) ×1
SYRINGE TOOMEY IRRIG 70ML (MISCELLANEOUS) IMPLANT
TUBING CONNECTING 10 (TUBING) ×1 IMPLANT
TUBING UROLOGY SET (TUBING) ×1 IMPLANT
WATER STERILE IRR 3000ML UROMA (IV SOLUTION) ×1 IMPLANT

## 2021-12-10 NOTE — Interval H&P Note (Signed)
History and Physical Interval Note:  12/10/2021 1:00 PM  Gerald Valdez  has presented today for surgery, with the diagnosis of RECURRENT BLADDER CANCER.  The various methods of treatment have been discussed with the patient and family. After consideration of risks, benefits and other options for treatment, the patient has consented to  Procedure(s) with comments: TRANSURETHRAL RESECTION OF BLADDER TUMOR WITH GEMCITABINE (N/A) - 1 HR as a surgical intervention.  The patient's history has been reviewed, patient examined, no change in status, stable for surgery.  I have reviewed the patient's chart and labs.  Questions were answered to the patient's satisfaction.     Lillette Boxer Clessie Karras

## 2021-12-10 NOTE — Progress Notes (Signed)
Pharmacy: gentamicin  Patient is a 88 y.o M with bladder cancer who presented to the [facility] for TURBT.  He received ancef x1 dose pre-op.  Pharmacy has been consulted to dose gentamicin post-op.  Clarified order with Jiles Crocker from [facility].  He said to give patient one dose of gentamicin post-op.   Scr 1.33 (crcl~31)  Plan: - gentamicin 5 mg/kg x1 dose. Of note, based on pt's renal function, this dose should last for 2 days. - pharmacy will sign off. Re-consult [place] if need further assistance.  Dia Sitter, PharmD, BCPS 12/10/2021 6:45 PM

## 2021-12-10 NOTE — Anesthesia Preprocedure Evaluation (Addendum)
Anesthesia Evaluation  Patient identified by MRN, date of birth, ID band Patient awake    Reviewed: Allergy & Precautions, NPO status , Patient's Chart, lab work & pertinent test results  Airway Mallampati: II  TM Distance: >3 FB Neck ROM: Full    Dental  (+) Upper Dentures, Lower Dentures   Pulmonary COPD, former smoker   Pulmonary exam normal        Cardiovascular hypertension, Pt. on medications + CAD   Rhythm:Regular Rate:Normal  ECHO 2022:  1. Left ventricular ejection fraction, by estimation, is 55 to 60%. The  left ventricle has normal function. The left ventricle has no regional  wall motion abnormalities. Left ventricular diastolic parameters are  consistent with Grade I diastolic  dysfunction (impaired relaxation).   2. Right ventricular systolic function is normal. The right ventricular  size is normal.   3. The mitral valve is normal in structure. No evidence of mitral valve  regurgitation. No evidence of mitral stenosis.   4. There is a 1 cm x 0.8 cm calcified, mobile density in the LVOT between  the aortic valve and anterior mitral valve leaflet. Consider TEE to better  evaluate. The aortic valve is calcified. There is moderate calcification  of the aortic valve. There is  moderate thickening of the aortic valve. Aortic valve regurgitation is not  visualized. No aortic stenosis is present.   5. The inferior vena cava is normal in size with greater than 50%  respiratory variability, suggesting right atrial pressure of 3 mmHg.      Neuro/Psych   Anxiety     negative neurological ROS     GI/Hepatic Neg liver ROS, PUD,GERD  ,,  Endo/Other    Renal/GU CRFRenal disease Bladder dysfunction  Bladder Ca    Musculoskeletal  (+) Arthritis ,    Abdominal Normal abdominal exam  (+)   Peds  Hematology  (+) Blood dyscrasia, anemia   Anesthesia Other Findings   Reproductive/Obstetrics                              Anesthesia Physical Anesthesia Plan  ASA: 3  Anesthesia Plan: General   Post-op Pain Management:    Induction: Intravenous  PONV Risk Score and Plan: 2 and Ondansetron, Dexamethasone and Treatment may vary due to age or medical condition  Airway Management Planned: Mask and LMA  Additional Equipment: None  Intra-op Plan:   Post-operative Plan: Extubation in OR  Informed Consent: I have reviewed the patients History and Physical, chart, labs and discussed the procedure including the risks, benefits and alternatives for the proposed anesthesia with the patient or authorized representative who has indicated his/her understanding and acceptance.     Dental advisory given  Plan Discussed with: CRNA  Anesthesia Plan Comments:        Anesthesia Quick Evaluation

## 2021-12-10 NOTE — Transfer of Care (Signed)
Immediate Anesthesia Transfer of Care Note  Patient: Gerald Valdez  Procedure(s) Performed: TRANSURETHRAL RESECTION OF BLADDER TUMOR WITH GEMCITABINE  Patient Location: PACU  Anesthesia Type:General  Level of Consciousness: sedated and responds to stimulation  Airway & Oxygen Therapy: Patient Spontanous Breathing and Patient connected to face mask oxygen  Post-op Assessment: Report given to RN and Post -op Vital signs reviewed and stable  Post vital signs: Reviewed and stable  Last Vitals:  Vitals Value Taken Time  BP 137/120 12/10/21 1428  Temp    Pulse 88 12/10/21 1430  Resp 23 12/10/21 1430  SpO2 100 % 12/10/21 1430  Vitals shown include unvalidated device data.  Last Pain:  Vitals:   12/10/21 1133  TempSrc: Oral         Complications: No notable events documented.

## 2021-12-10 NOTE — Op Note (Signed)
Reoperative diagnosis: Recurrent bladder cancer  Postoperative diagnosis same, smallest tumor approximately 6 cm in diameter  Principal procedure: Transurethral resection of bladder tumor, greater than 5 cm, placement of intravesical gemcitabine  Surgeon Bridget Westbrooks  Anesthesia General with LMA  Estimated blood loss: Less than 50 mL  Specimen: Bladder tumor, to pathology  Complications: None  Indications: 88 year old male with history of nonmuscle invasive, high-grade bladder cancer.  Recent follow-up revealed the patient to have a large amount of tumor recurrence.  Recurrences were in the anterior right bladder wall as well as the left posterior bladder.  These were quite large and papillary.  He presents at this time for resection with possible gemcitabine administration.  The patient and his wife are well versed in the procedure, risks and complications.  They understand these and desire to proceed.  Findings: Urethra was free of lesions or stricture.  Prostate minimally obstructive.  Bladder inspected circumferentially.  Ureteral orifices were normal in location and configuration.  Bladder tumor recurrences were 2 large tumors, one of the left posterior lateral wall near the dome.  The other large tumor was in the right bladder neck located anteriorly.  There were several small satellite recurrences that were very small low-lying papillary areas.  Description of procedure: Patient was properly identified in the holding area.  He seemed to the operating room where general anesthetic was administered with the LMA.  He received preoperative IV gentamicin.  He was placed in the dorsolithotomy position.  Genitalia and perineum were prepped, draped, proper timeout performed.  51 French resectoscope sheath was placed using the visual obturator.  Circumferential inspection was performed of the bladder with the above-mentioned findings seen.  The resectoscope and bipolar cutting loop were placed.  All  tumor was then resected.  First tumor was the left posterior lateral tumor.  There was a fair sized tumor stalk.  This was resected just at the bladder surface.  Small satellite areas were cauterized.  Once the left lateral tumor was thoroughly resected and the base cauterized, I moved to the anterior right bladder neck tumor.  Again, this was resected down into the muscular layer.  Following this, the base was cauterized.  Several small satellite lesions were seen throughout the bladder, these were cauterized or resected.  At this point all tumor fragments were removed from the bladder using the [facility] syringe.  I reinspected the bladder.  Several small bleeders were noted and cauterized.  At this point, there was adequate hemostasis.  The scope was removed.  A 20 French Foley catheter was placed.  Balloon filled with 10 cc of water.  Clear irrigant came out of this.  This was then hooked to dependent drainage.  The patient was then awakened.  He is taken to the PACU in stable condition having tolerated the procedure well.  In the PACU, 2 g of gemcitabine was instilled, the bladder/catheter clamped and this was left indwelling for an hour and then drained.

## 2021-12-10 NOTE — Anesthesia Procedure Notes (Signed)
Procedure Name: LMA Insertion Date/Time: 12/10/2021 1:36 PM  Performed by: Gean Maidens, CRNAPre-anesthesia Checklist: Patient identified, Emergency Drugs available, Suction available, Patient being monitored and Timeout performed Patient Re-evaluated:Patient Re-evaluated prior to induction Oxygen Delivery Method: Circle system utilized Preoxygenation: Pre-oxygenation with 100% oxygen Induction Type: IV induction Ventilation: Mask ventilation without difficulty LMA: LMA inserted LMA Size: 4.0 Number of attempts: 1 Placement Confirmation: positive ETCO2 and breath sounds checked- equal and bilateral Tube secured with: Tape Dental Injury: Teeth and Oropharynx as per pre-operative assessment

## 2021-12-10 NOTE — Anesthesia Postprocedure Evaluation (Signed)
Anesthesia Post Note  Patient: Gerald Valdez  Procedure(s) Performed: TRANSURETHRAL RESECTION OF BLADDER TUMOR WITH GEMCITABINE     Patient location during evaluation: PACU Anesthesia Type: General Level of consciousness: awake and alert Pain management: pain level controlled Vital Signs Assessment: post-procedure vital signs reviewed and stable Respiratory status: spontaneous breathing, nonlabored ventilation, respiratory function stable and patient connected to nasal cannula oxygen Cardiovascular status: blood pressure returned to baseline and stable Postop Assessment: no apparent nausea or vomiting Anesthetic complications: no   No notable events documented.  Last Vitals:  Vitals:   12/10/21 1615 12/10/21 2128  BP: (!) 151/79 121/69  Pulse: 92 82  Resp: (!) 22 19  Temp: (!) 36.4 C 36.4 C  SpO2: 97% 100%    Last Pain:  Vitals:   12/10/21 2128  TempSrc: Oral  PainSc:                  Miciah Shealy

## 2021-12-10 NOTE — H&P (Signed)
H&P  Chief Complaint: Recurrent bladder cancer  History of Present Illness: 88 year old male with history of high-grade nonmuscle invasive bladder cancer presents at this time for repeat resection of bladder cancer.  He has had fairly rapid recurrence of his fairly large tumor burden since his last TURBT which was in May 2023.  He has been treated for ESBL E. coli.  He is currently on Macrobid and was given a dose of fosfomycin last night.  Urinalysis yesterday was not indicative of infection.  Past Medical History:  Diagnosis Date   Acute duodenal ulcer with bleeding 01/30/2014   Acute post-hemorrhagic anemia 01/30/2014   Anxiety    Aortic atherosclerosis ([place]) 02/10/2020   Arthritis    Cancer ([place])    bladder   Chronic kidney disease    COPD exacerbation ([place]) 02/10/2020   Coronary artery disease    Dyspnea    Dyspnea on exertion 02/10/2020   Emphysema of lung ([place])    Essential hypertension 02/10/2020   GERD (gastroesophageal reflux disease) 02/10/2020   GI bleed 01/30/2014   Hyperlipidemia 02/10/2020    Past Surgical History:  Procedure Laterality Date   BLADDER SURGERY     CATARACT EXTRACTION W/ INTRAOCULAR LENS IMPLANT     CYSTOSCOPY W/ RETROGRADES Bilateral 11/20/2020   Procedure: CYSTOSCOPY WITH RETROGRADE PYELOGRAM;  Surgeon: Franchot Gallo, MD;  Location: [facility];  Service: Urology;  Laterality: Bilateral;   CYSTOSCOPY W/ RETROGRADES Right 02/12/2021   Procedure: CYSTOSCOPY WITH RETROGRADE PYELOGRAM;  Surgeon: Franchot Gallo, MD;  Location: [facility];  Service: Urology;  Laterality: Right;   CYSTOSCOPY W/ RETROGRADES Bilateral 06/25/2021   Procedure: CYSTOSCOPY WITH RETROGRADE PYELOGRAM;  Surgeon: Franchot Gallo, MD;  Location: [facility];  Service: Urology;  Laterality: Bilateral;   ESOPHAGOGASTRODUODENOSCOPY N/A 01/30/2014   Procedure: ESOPHAGOGASTRODUODENOSCOPY (EGD);  Surgeon: Inda Castle, MD;  Location: [name];  Service: Endoscopy;  Laterality:  N/A;   TONSILLECTOMY     TRANSURETHRAL RESECTION OF BLADDER TUMOR N/A 02/12/2021   Procedure: REPEAT TRANSURETHRAL RESECTION OF BLADDER TUMOR (TURBT);  Surgeon: Franchot Gallo, MD;  [facility]: [facility];  Service: Urology;  Laterality: N/A;   TRANSURETHRAL RESECTION OF BLADDER TUMOR WITH MITOMYCIN-C N/A 11/20/2020   Procedure: TRANSURETHRAL RESECTION OF BLADDER TUMOR;  Surgeon: Franchot Gallo, MD;  [facility];  Service: Urology;  Laterality: N/A;   TRANSURETHRAL RESECTION OF BLADDER TUMOR WITH MITOMYCIN-C N/A 06/25/2021   Procedure: TRANSURETHRAL RESECTION OF BLADDER TUMOR WITH GEMCITABINE;  Surgeon: Franchot Gallo, MD;  Location[facility];  Service: Urology;  Laterality: N/A;  1 HR    Home Medications:    Allergies: No Known Allergies  Family History  Problem Relation Age of Onset   Stroke Father     Social History:  reports that he quit smoking about 29 years ago. His smoking use included cigarettes. He started smoking about 76 years ago. He has never used smokeless tobacco. He reports that he does not currently use alcohol. He reports that he does not use drugs.  ROS: A complete review of systems was performed.  All systems are negative except for pertinent findings as noted.  Physical Exam:  Vital signs in last 24 hours: BP (!) 159/77   Pulse 96   Temp 97.8 F (36.6 C) (Oral)   Resp (!) 25   Ht '5\' 7"'$  (1.702 m)   Wt 59.9 kg   SpO2 99%   BMI 20.67 kg/m  Constitutional:  Alert and oriented, No acute distress Cardiovascular: Regular rate  Respiratory: Normal respiratory  effort Psychiatric: Normal mood and affect  I have reviewed prior pt notes  I have reviewed notes from referring/previous physicians  I have reviewed urinalysis results  I have independently reviewed prior imaging  I have reviewed prior PSA results  I have reviewed prior urine culture   Impression/Assessment:  Recurrent high-grade nonmuscle invasive bladder cancer  Plan:  Repeat  resection, possible gemcitabine.

## 2021-12-11 ENCOUNTER — Encounter (HOSPITAL_COMMUNITY): Payer: Self-pay | Admitting: Urology

## 2021-12-11 DIAGNOSIS — N183 Chronic kidney disease, stage 3 unspecified: Secondary | ICD-10-CM | POA: Diagnosis not present

## 2021-12-11 DIAGNOSIS — J441 Chronic obstructive pulmonary disease with (acute) exacerbation: Secondary | ICD-10-CM | POA: Diagnosis not present

## 2021-12-11 DIAGNOSIS — I129 Hypertensive chronic kidney disease with stage 1 through stage 4 chronic kidney disease, or unspecified chronic kidney disease: Secondary | ICD-10-CM | POA: Diagnosis not present

## 2021-12-11 DIAGNOSIS — I2510 Atherosclerotic heart disease of native coronary artery without angina pectoris: Secondary | ICD-10-CM | POA: Diagnosis not present

## 2021-12-11 DIAGNOSIS — Z87891 Personal history of nicotine dependence: Secondary | ICD-10-CM | POA: Diagnosis not present

## 2021-12-11 DIAGNOSIS — C679 Malignant neoplasm of bladder, unspecified: Secondary | ICD-10-CM | POA: Diagnosis not present

## 2021-12-11 LAB — SURGICAL PATHOLOGY

## 2021-12-11 MED ORDER — CHLORHEXIDINE GLUCONATE CLOTH 2 % EX PADS
6.0000 | MEDICATED_PAD | Freq: Every day | CUTANEOUS | Status: DC
  Administered 2021-12-11: 6 via TOPICAL

## 2021-12-11 MED ORDER — NITROFURANTOIN MONOHYD MACRO 100 MG PO CAPS: 100.0000 mg | ORAL_CAPSULE | Freq: Every day | ORAL | Status: DC

## 2021-12-11 NOTE — Addendum Note (Signed)
Addendum  created 12/11/21 1235 by Janeece Riggers, MD   Intraprocedure Staff edited

## 2021-12-11 NOTE — TOC Transition Note (Signed)
Transition of Care [facility]) - CM/SW Discharge Note   Patient Details  Name: HASAN DOUSE MRN: [id number] Date of Birth: [birth year]  [facility]) CM/SW Contact:  Leeroy Cha, RN Phone Number: 12/11/2021, 8:23 AM   Clinical Narrative:    [id number] discharged to return home.  Chart reviewed for TOC needs.  None found.  Patient self care.   Final next level of care: Home/Self Care Barriers to Discharge: Barriers Resolved   Patient Goals and CMS Choice Patient states their goals for this hospitalization and ongoing recovery are:: to go home CMS Medicare.gov Compare Post Acute Care list provided to:: Patient Choice offered to / list presented to : Patient  Discharge Placement                       Discharge Plan and Services   Discharge Planning Services: CM Consult                                 Social Determinants of Health (SDOH) Interventions     Readmission Risk Interventions   No data to display

## 2021-12-11 NOTE — TOC Initial Note (Signed)
Transition of Care [facility]) - Initial/Assessment Note    Patient Details  Name: Gerald Valdez MRN: [id number] Date of Birth: [birth year]  Transition of Care [facility]) CM/SW Contact:    Leeroy Cha, RN Phone Number: 12/11/2021, 7:29 AM  Clinical Narrative:                  Transition of Care [facility]) Screening Note   Patient Details  Name: Gerald Valdez Date of Birth: 09-04-1928   Transition of [facility]) CM/SW Contact:    Leeroy Cha, RN Phone Number: 12/11/2021, 7:30 AM    Transition of Care Department [facility]) has reviewed patient and no TOC needs have been identified at this time. We will continue to monitor patient advancement through interdisciplinary progression rounds. If new patient transition needs arise, please place a TOC consult.    Expected Discharge Plan: Home/Self Care Barriers to Discharge: Continued Medical Work up   Patient Goals and CMS Choice Patient states their goals for this hospitalization and ongoing recovery are:: to go home CMS Medicare.gov Compare Post Acute Care list provided to:: Patient Choice offered to / list presented to : Patient  Expected Discharge Plan and Services Expected Discharge Plan: Home/Self Care   Discharge Planning Services: CM Consult   Living arrangements for the past 2 months: Single Family Home                                      Prior Living Arrangements/Services Living arrangements for the past 2 months: Single Family Home Lives with:: Spouse Patient language and need for interpreter reviewed:: Yes Do you feel safe going back to the place where you live?: Yes            Criminal Activity/Legal Involvement Pertinent to Current Situation/Hospitalization: No - Comment as needed  Activities of Daily Living Home Assistive Devices/Equipment: None ADL Screening (condition at time of admission) Patient's cognitive ability adequate to safely complete daily activities?: Yes Is the patient deaf or  have difficulty hearing?: Yes Does the patient have difficulty seeing, even when wearing glasses/contacts?: No Does the patient have difficulty concentrating, remembering, or making decisions?: No Patient able to express need for assistance with ADLs?: Yes Does the patient have difficulty dressing or bathing?: No Independently performs ADLs?: Yes (appropriate for developmental age) Does the patient have difficulty walking or climbing stairs?: No Weakness of Legs: None Weakness of Arms/Hands: None  Permission Sought/Granted                  Emotional Assessment Appearance:: Appears stated age Attitude/Demeanor/Rapport: Engaged Affect (typically observed): Calm Orientation: : Oriented to Self, Oriented to Place, Oriented to  Time, Oriented to Situation Alcohol / Substance Use: Tobacco Use (quit 29 years ago/no etoh or drug use) Psych Involvement: No (comment)  Admission diagnosis:  Cancer of overlapping sites of bladder ([place]) [C67.8] Patient Active Problem List   Diagnosis Date Noted   Hypokalemia 07/04/2021   Thrombocytopenia ([place]) 07/04/2021   Iron deficiency anemia 07/04/2021   Protein-calorie malnutrition, severe 07/03/2021   Dehydration 07/01/2021   AKI (acute kidney injury) ([place]) 07/01/2021   Stage 3a chronic kidney disease (CKD) ([place]) 07/01/2021   Protein-calorie malnutrition, moderate ([place]) 07/01/2021   Diarrhea 07/01/2021   UTI (urinary tract infection) 07/01/2021   Sepsis ([place]) 07/01/2021   Chronic respiratory failure with hypoxia ([place]) 07/01/2021   Hyperglycemia 07/01/2021   Normocytic anemia 07/01/2021  Urinary retention 06/26/2021   Cancer of overlapping sites of bladder ([place]) 06/25/2021   Malignant neoplasm of overlapping sites of bladder ([place]) 11/20/2020   COPD (chronic obstructive pulmonary disease) ([place]) 02/10/2020   Aortic atherosclerosis ([place]) 02/10/2020   Dyspnea on exertion 02/10/2020   Essential hypertension 02/10/2020   Hyperlipidemia 02/10/2020    GERD (gastroesophageal reflux disease) 02/10/2020   GI bleed 01/30/2014   Acute post-hemorrhagic anemia 01/30/2014   Acute duodenal ulcer with bleeding 01/30/2014   PCP:  Prince Solian, MD Pharmacy:   [facility] [address] [place] [address] Phone: [phone] Fax: [phone]     Social Determinants of Health (SDOH) Interventions    Readmission Risk Interventions   No data to display

## 2021-12-11 NOTE — Discharge Instructions (Signed)
You may see some blood in the urine and may have some burning with urination for 48-72 hours. You also may notice that you have to urinate more frequently or urgently after your procedure which is normal.  You should call should you develop an inability urinate, fever > 101, persistent nausea and vomiting that prevents you from eating or drinking to stay hydrated.  If you have a catheter, you will be taught how to take care of the catheter by the nursing staff prior to discharge from the hospital.  You may periodically feel a strong urge to void with the catheter in place.  This is a bladder spasm and most often can occur when having a bowel movement or moving around. It is typically self-limited and usually will stop after a few minutes.  You may use some Vaseline or Neosporin around the tip of the catheter to reduce friction at the tip of the penis. You may also see some blood in the urine.  A very small amount of blood can make the urine look quite red.  As long as the catheter is draining well, there usually is not a problem.  However, if the catheter is not draining well and is bloody, you should call the office [phone]) to notify us. OK to remove catheter on Monday morning.

## 2021-12-22 NOTE — Discharge Summary (Signed)
Patient ID: BYREN PANKOW MRN: [id number] DOB/AGE: 04/15/1928 88 y.o.  Admit date: 12/10/2021 Discharge date: 12/22/2021  Primary Care Physician:  Prince Solian, MD  Discharge Diagnoses:   Present on Admission:  Cancer of overlapping sites of bladder [facility])    Discharge Medications: Allergies as of 12/11/2021   No Known Allergies      Medication List     TAKE these medications    amLODipine 5 MG tablet Commonly known as: NORVASC Take 5 mg by mouth daily with breakfast.   ascorbic acid 500 MG tablet Commonly known as: VITAMIN C Take 500 mg by mouth daily.   escitalopram 10 MG tablet Commonly known as: LEXAPRO Take 10 mg by mouth daily.   ferrous sulfate 325 (65 FE) MG tablet Take 325 mg by mouth daily with breakfast.   loratadine 10 MG tablet Commonly known as: CLARITIN Take 10 mg by mouth daily.   multivitamin with minerals Tabs tablet Take 1 tablet by mouth daily with breakfast.   nitrofurantoin (macrocrystal-monohydrate) 100 MG capsule Commonly known as: MACROBID Take 1 capsule (100 mg total) by mouth at bedtime. Take home scrip until done What changed:  when to take this additional instructions   OXYGEN Inhale 3 L into the lungs continuous.   rosuvastatin 20 MG tablet Commonly known as: CRESTOR Take 20 mg by mouth at bedtime.   saccharomyces boulardii 250 MG capsule Commonly known as: FLORASTOR Take 1 capsule (250 mg total) by mouth 2 (two) times daily. What changed: when to take this         Significant Diagnostic Studies:  No results found.  Brief H and P: For complete details please refer to admission H and P, but in brief patient admitted for repeat TURBT for recurrent high-grade nonmuscle invasive bladder cancer.  Hospital Course: Patient was admitted for overnight observation due to his age and invasive procedure.  He tolerated the TURBT well, and the morning after procedure was sent home in good condition with a catheter to have  voiding trial later on.   Day of Discharge BP (!) 106/49 (BP Location: Right Arm)   Pulse 89   Temp 98.2 F (36.8 C) (Oral)   Resp 18   Ht '5\' 7"'$  (1.702 m)   Wt 59.9 kg   SpO2 100%   BMI 20.67 kg/m   No results found for this or any previous visit (from the past 24 hour(s)).  Physical Exam: General: Alert and awake oriented x3 not in any acute distress. HEENT: anicteric sclera, pupils reactive to light and accommodation CVS: S1-S2 clear no murmur rubs or gallops Chest: clear to auscultation bilaterally, no wheezing rales or rhonchi Abdomen: soft nontender, nondistended, normal bowel sounds, no organomegaly Extremities: no cyanosis, clubbing or edema noted bilaterally Neuro: Cranial nerves II-XII intact, no focal neurological deficits  Disposition: Home  Diet: Regular     TESTS THAT NEED FOLLOW-UP  Pathology review  DISCHARGE FOLLOW-UP    Time spent on Discharge:  10 minutes  Signed: Lillette Boxer Marquon Alcala 12/22/2021, 2:09 PM

## 2021-12-25 DIAGNOSIS — C678 Malignant neoplasm of overlapping sites of bladder: Secondary | ICD-10-CM | POA: Diagnosis not present

## 2021-12-25 DIAGNOSIS — N3000 Acute cystitis without hematuria: Secondary | ICD-10-CM | POA: Diagnosis not present

## 2022-01-18 DIAGNOSIS — C44319 Basal cell carcinoma of skin of other parts of face: Secondary | ICD-10-CM | POA: Diagnosis not present

## 2022-01-18 DIAGNOSIS — D492 Neoplasm of unspecified behavior of bone, soft tissue, and skin: Secondary | ICD-10-CM | POA: Diagnosis not present

## 2022-01-18 DIAGNOSIS — L570 Actinic keratosis: Secondary | ICD-10-CM | POA: Diagnosis not present

## 2022-01-18 DIAGNOSIS — Z08 Encounter for follow-up examination after completed treatment for malignant neoplasm: Secondary | ICD-10-CM | POA: Diagnosis not present

## 2022-01-18 DIAGNOSIS — Z85828 Personal history of other malignant neoplasm of skin: Secondary | ICD-10-CM | POA: Diagnosis not present

## 2022-01-18 DIAGNOSIS — L578 Other skin changes due to chronic exposure to nonionizing radiation: Secondary | ICD-10-CM | POA: Diagnosis not present

## 2022-01-21 DIAGNOSIS — C678 Malignant neoplasm of overlapping sites of bladder: Secondary | ICD-10-CM | POA: Diagnosis not present

## 2022-01-21 DIAGNOSIS — Z5111 Encounter for antineoplastic chemotherapy: Secondary | ICD-10-CM | POA: Diagnosis not present

## 2022-01-22 DIAGNOSIS — J441 Chronic obstructive pulmonary disease with (acute) exacerbation: Secondary | ICD-10-CM | POA: Diagnosis not present

## 2022-01-22 DIAGNOSIS — N179 Acute kidney failure, unspecified: Secondary | ICD-10-CM | POA: Diagnosis not present

## 2022-01-22 DIAGNOSIS — N1832 Chronic kidney disease, stage 3b: Secondary | ICD-10-CM | POA: Diagnosis not present

## 2022-01-22 DIAGNOSIS — C679 Malignant neoplasm of bladder, unspecified: Secondary | ICD-10-CM | POA: Diagnosis not present

## 2022-01-22 DIAGNOSIS — D649 Anemia, unspecified: Secondary | ICD-10-CM | POA: Diagnosis not present

## 2022-01-22 DIAGNOSIS — I129 Hypertensive chronic kidney disease with stage 1 through stage 4 chronic kidney disease, or unspecified chronic kidney disease: Secondary | ICD-10-CM | POA: Diagnosis not present

## 2022-01-22 DIAGNOSIS — N183 Chronic kidney disease, stage 3 unspecified: Secondary | ICD-10-CM | POA: Diagnosis not present

## 2022-01-22 DIAGNOSIS — A0472 Enterocolitis due to Clostridium difficile, not specified as recurrent: Secondary | ICD-10-CM | POA: Diagnosis not present

## 2022-01-22 DIAGNOSIS — J9601 Acute respiratory failure with hypoxia: Secondary | ICD-10-CM | POA: Diagnosis not present

## 2022-01-22 DIAGNOSIS — I2510 Atherosclerotic heart disease of native coronary artery without angina pectoris: Secondary | ICD-10-CM | POA: Diagnosis not present

## 2022-01-22 DIAGNOSIS — I700 Atherosclerosis of aorta: Secondary | ICD-10-CM | POA: Diagnosis not present

## 2022-01-22 DIAGNOSIS — E785 Hyperlipidemia, unspecified: Secondary | ICD-10-CM | POA: Diagnosis not present

## 2022-01-28 DIAGNOSIS — C678 Malignant neoplasm of overlapping sites of bladder: Secondary | ICD-10-CM | POA: Diagnosis not present

## 2022-01-28 DIAGNOSIS — Z5111 Encounter for antineoplastic chemotherapy: Secondary | ICD-10-CM | POA: Diagnosis not present

## 2022-02-04 DIAGNOSIS — Z5111 Encounter for antineoplastic chemotherapy: Secondary | ICD-10-CM | POA: Diagnosis not present

## 2022-02-04 DIAGNOSIS — C678 Malignant neoplasm of overlapping sites of bladder: Secondary | ICD-10-CM | POA: Diagnosis not present

## 2022-03-19 DIAGNOSIS — C44319 Basal cell carcinoma of skin of other parts of face: Secondary | ICD-10-CM | POA: Diagnosis not present

## 2022-03-31 DIAGNOSIS — R8271 Bacteriuria: Secondary | ICD-10-CM | POA: Diagnosis not present

## 2022-03-31 DIAGNOSIS — N3000 Acute cystitis without hematuria: Secondary | ICD-10-CM | POA: Diagnosis not present

## 2022-03-31 DIAGNOSIS — Z8551 Personal history of malignant neoplasm of bladder: Secondary | ICD-10-CM | POA: Diagnosis not present

## 2022-03-31 DIAGNOSIS — C678 Malignant neoplasm of overlapping sites of bladder: Secondary | ICD-10-CM | POA: Diagnosis not present

## 2022-04-02 DIAGNOSIS — C44319 Basal cell carcinoma of skin of other parts of face: Secondary | ICD-10-CM | POA: Diagnosis not present

## 2022-04-13 DIAGNOSIS — H52203 Unspecified astigmatism, bilateral: Secondary | ICD-10-CM | POA: Diagnosis not present

## 2022-04-13 DIAGNOSIS — Z961 Presence of intraocular lens: Secondary | ICD-10-CM | POA: Diagnosis not present

## 2022-04-13 DIAGNOSIS — H35372 Puckering of macula, left eye: Secondary | ICD-10-CM | POA: Diagnosis not present

## 2022-04-14 DIAGNOSIS — C678 Malignant neoplasm of overlapping sites of bladder: Secondary | ICD-10-CM | POA: Diagnosis not present

## 2022-04-16 DIAGNOSIS — C44319 Basal cell carcinoma of skin of other parts of face: Secondary | ICD-10-CM | POA: Diagnosis not present

## 2022-04-16 DIAGNOSIS — D485 Neoplasm of uncertain behavior of skin: Secondary | ICD-10-CM | POA: Diagnosis not present

## 2022-04-19 DIAGNOSIS — C4441 Basal cell carcinoma of skin of scalp and neck: Secondary | ICD-10-CM | POA: Diagnosis not present

## 2022-04-19 DIAGNOSIS — C44519 Basal cell carcinoma of skin of other part of trunk: Secondary | ICD-10-CM | POA: Diagnosis not present

## 2022-04-19 DIAGNOSIS — D492 Neoplasm of unspecified behavior of bone, soft tissue, and skin: Secondary | ICD-10-CM | POA: Diagnosis not present

## 2022-04-19 DIAGNOSIS — L570 Actinic keratosis: Secondary | ICD-10-CM | POA: Diagnosis not present

## 2022-04-21 ENCOUNTER — Other Ambulatory Visit: Payer: Self-pay | Admitting: Urology

## 2022-04-30 DIAGNOSIS — C44319 Basal cell carcinoma of skin of other parts of face: Secondary | ICD-10-CM | POA: Diagnosis not present

## 2022-05-04 DIAGNOSIS — C44519 Basal cell carcinoma of skin of other part of trunk: Secondary | ICD-10-CM | POA: Diagnosis not present

## 2022-05-04 DIAGNOSIS — C44529 Squamous cell carcinoma of skin of other part of trunk: Secondary | ICD-10-CM | POA: Diagnosis not present

## 2022-05-04 DIAGNOSIS — C4441 Basal cell carcinoma of skin of scalp and neck: Secondary | ICD-10-CM | POA: Diagnosis not present

## 2022-05-10 NOTE — Progress Notes (Addendum)
COVID Vaccine Completed:Yes  Date of COVID positive in last 90 days:  No  PCP - [name], MD Cardiologist - Kristeen Miss, MD  Chest x-ray - 07-01-21 Epic EKG - 12-10-21 Epic Stress Test - Yes ECHO - 02-11-20 Epic Cardiac Cath - N/A Pacemaker/ICD device last checked: Spinal Cord Stimulator:  Bowel Prep - N/A  Sleep Study - N/A CPAP -   Fasting Blood Sugar - N/A Checks Blood Sugar _____ times a day  Last dose of GLP1 agonist-  N/A GLP1 instructions:  N/A   Last dose of SGLT-2 inhibitors-  N/A SGLT-2 instructions: N/A  Blood Thinner Instructions:  N/A Aspirin Instructions: Last Dose:  Activity level:  Unable to climb stairs due to shortness of breath with exertion.  Able to perform activities of daily living without chest pain.  On continuous oxygen for COPD.     Anesthesia review:  COPD, emphysema, HTN, CKD Oxygen at 3L continuous  Patient denies fever, cough and chest pain at PAT appointment  Patient verbalized understanding of instructions that were given to them at the PAT appointment. Patient was also instructed that they will need to review over the PAT instructions again at home before surgery.

## 2022-05-10 NOTE — Patient Instructions (Addendum)
SURGICAL WAITING ROOM VISITATION Patients having surgery or a procedure may have no more than 2 support people in the waiting area - these visitors may rotate.    Children under the age of 67 must have an adult with them who is not the patient.  If the patient needs to stay at the hospital during part of their recovery, the visitor guidelines for inpatient rooms apply. Pre-op nurse will coordinate an appropriate time for 1 support person to accompany patient in pre-op.  This support person may not rotate.    Please refer to the [facility] website for the visitor guidelines for Inpatients (after your surgery is over and you are in a regular room).      Your procedure is scheduled on: 05-27-22   Report to [facility] Main Entrance    Report to admitting at 6:45 AM   Call this number if you have problems the morning of surgery [phone]   Do not eat food or drink liquids :After Midnight.          If you have questions, please contact your surgeon's office.   FOLLOW  ANY ADDITIONAL PRE OP INSTRUCTIONS YOU RECEIVED FROM YOUR SURGEON'S OFFICE!!!     Oral Hygiene is also important to reduce your risk of infection.                                    Remember - BRUSH YOUR TEETH THE MORNING OF SURGERY WITH YOUR REGULAR TOOTHPASTE   Do NOT smoke after Midnight   Take these medicines the morning of surgery with A SIP OF WATER:   Amlodipine  Escitalopram  Claritin                              You may not have any metal on your body including  jewelry, and body piercing             Do not wear  lotions, powders, cologne, or deodorant              Men may shave face and neck.   Do not bring valuables to the hospital. [facility] IS NOT RESPONSIBLE   FOR VALUABLES.   Contacts, dentures or bridgework may not be worn into surgery.   Bring small overnight bag day of surgery.   DO NOT BRING YOUR HOME MEDICATIONS TO THE HOSPITAL. PHARMACY WILL DISPENSE MEDICATIONS LISTED ON  YOUR MEDICATION LIST TO YOU DURING YOUR ADMISSION IN THE HOSPITAL!               Please read over the following fact sheets you were given: IF YOU HAVE QUESTIONS ABOUT YOUR PRE-OP INSTRUCTIONS PLEASE CALL [phone] [place]  If you received a COVID test during your pre-op visit  it is requested that you wear a mask when out in public, stay away from anyone that may not be feeling well and notify your surgeon if you develop symptoms. If you test positive for Covid or have been in contact with anyone that has tested positive in the last 10 days please notify you surgeon.  [facility] - Preparing for Surgery Before surgery, you can play an important role.  Because skin is not sterile, your skin needs to be as free of germs as possible.  You can reduce the number of germs on your skin by washing with CHG (  chlorahexidine gluconate) soap before surgery.  CHG is an antiseptic cleaner which kills germs and bonds with the skin to continue killing germs even after washing. Please DO NOT use if you have an allergy to CHG or antibacterial soaps.  If your skin becomes reddened/irritated stop using the CHG and inform your nurse when you arrive at Short Stay. Do not shave (including legs and underarms) for at least 48 hours prior to the first CHG shower.  You may shave your face/neck.  Please follow these instructions carefully:  1.  Shower with CHG Soap the night before surgery and the  morning of surgery.  2.  If you choose to wash your hair, wash your hair first as usual with your normal  shampoo.  3.  After you shampoo, rinse your hair and body thoroughly to remove the shampoo.                             4.  Use CHG as you would any other liquid soap.  You can apply chg directly to the skin and wash.  Gently with a scrungie or clean washcloth.  5.  Apply the CHG Soap to your body ONLY FROM THE NECK DOWN.   Do   not use on face/ open                           Wound or open sores. Avoid contact with eyes, ears  mouth and   genitals (private parts).                       Wash face,  Genitals (private parts) with your normal soap.             6.  Wash thoroughly, paying special attention to the area where your    surgery  will be performed.  7.  Thoroughly rinse your body with warm water from the neck down.  8.  DO NOT shower/wash with your normal soap after using and rinsing off the CHG Soap.                9.  Pat yourself dry with a clean towel.            10.  Wear clean pajamas.            11.  Place clean sheets on your bed the night of your first shower and do not  sleep with pets. Day of Surgery : Do not apply any lotions/deodorants the morning of surgery.  Please wear clean clothes to the hospital/surgery center.  FAILURE TO FOLLOW THESE INSTRUCTIONS MAY RESULT IN THE CANCELLATION OF YOUR SURGERY  PATIENT SIGNATURE_________________________________  NURSE SIGNATURE__________________________________  ________________________________________________________________________

## 2022-05-14 ENCOUNTER — Encounter (HOSPITAL_COMMUNITY): Payer: Self-pay

## 2022-05-14 ENCOUNTER — Other Ambulatory Visit: Payer: Self-pay

## 2022-05-14 ENCOUNTER — Encounter (HOSPITAL_COMMUNITY)
Admission: RE | Admit: 2022-05-14 | Discharge: 2022-05-14 | Disposition: A | Discharge status: Home / Self Care | Payer: Medicare Other | Source: Ambulatory Visit | Attending: Urology | Admitting: Urology

## 2022-05-14 VITALS — BP 122/60 | HR 74 | Temp 98.0°F | Resp 21 | Ht 66.0 in | Wt 132.8 lb

## 2022-05-14 DIAGNOSIS — I10 Essential (primary) hypertension: Secondary | ICD-10-CM | POA: Diagnosis not present

## 2022-05-14 DIAGNOSIS — D649 Anemia, unspecified: Secondary | ICD-10-CM

## 2022-05-14 DIAGNOSIS — Z01812 Encounter for preprocedural laboratory examination: Secondary | ICD-10-CM | POA: Insufficient documentation

## 2022-05-14 HISTORY — DX: Basal cell carcinoma of skin, unspecified: C44.91

## 2022-05-14 LAB — BASIC METABOLIC PANEL
Anion gap: 7 (ref 5–15)
BUN: 31 mg/dL — ABNORMAL HIGH (ref 8–23)
CO2: 35 mmol/L — ABNORMAL HIGH (ref 22–32)
Calcium: 8.9 mg/dL (ref 8.9–10.3)
Chloride: 98 mmol/L (ref 98–111)
Creatinine, Ser: 1.24 mg/dL (ref 0.61–1.24)
GFR, Estimated: 55 mL/min — ABNORMAL LOW (ref >60)
Glucose, Bld: 108 mg/dL — ABNORMAL HIGH (ref 70–99)
Potassium: 4.6 mmol/L (ref 3.5–5.1)
Sodium: 140 mmol/L (ref 135–145)

## 2022-05-14 LAB — CBC
HCT: 38 % — ABNORMAL LOW (ref 39.0–52.0)
Hemoglobin: 11.5 g/dL — ABNORMAL LOW (ref 13.0–17.0)
MCH: 30.1 pg (ref 26.0–34.0)
MCHC: 30.3 g/dL (ref 30.0–36.0)
MCV: 99.5 fL (ref 80.0–100.0)
Platelets: 183 10*3/uL (ref 150–400)
RBC: 3.82 MIL/uL — ABNORMAL LOW (ref 4.22–5.81)
RDW: 12.8 % (ref 11.5–15.5)
WBC: 8.5 10*3/uL (ref 4.0–10.5)
nRBC: 0 % (ref 0.0–0.2)

## 2022-05-17 DIAGNOSIS — R8271 Bacteriuria: Secondary | ICD-10-CM | POA: Diagnosis not present

## 2022-05-17 DIAGNOSIS — C678 Malignant neoplasm of overlapping sites of bladder: Secondary | ICD-10-CM | POA: Diagnosis not present

## 2022-05-18 NOTE — Anesthesia Preprocedure Evaluation (Addendum)
Anesthesia Evaluation  Patient identified by MRN, date of birth, ID band Patient awake    Reviewed: Allergy & Precautions, H&P , NPO status , Patient's Chart, lab work & pertinent test results  Airway Mallampati: II   Neck ROM: full    Dental   Pulmonary COPD, former smoker   breath sounds clear to auscultation       Cardiovascular hypertension, + CAD   Rhythm:regular Rate:Normal  TTE (02/2020): EF 55%   Neuro/Psych   Anxiety        GI/Hepatic PUD,GERD  ,,  Endo/Other    Renal/GU      Musculoskeletal  (+) Arthritis ,    Abdominal   Peds  Hematology   Anesthesia Other Findings   Reproductive/Obstetrics                             Anesthesia Physical Anesthesia Plan  ASA: 3  Anesthesia Plan: General   Post-op Pain Management:    Induction: Intravenous  PONV Risk Score and Plan: 2 and Ondansetron, Dexamethasone and Treatment may vary due to age or medical condition  Airway Management Planned: Oral ETT  Additional Equipment:   Intra-op Plan:   Post-operative Plan: Extubation in OR  Informed Consent: I have reviewed the patients History and Physical, chart, labs and discussed the procedure including the risks, benefits and alternatives for the proposed anesthesia with the patient or authorized representative who has indicated his/her understanding and acceptance.     Dental advisory given  Plan Discussed with: CRNA, Anesthesiologist and Surgeon  Anesthesia Plan Comments: (See PAT note from 4/5)        Anesthesia Quick Evaluation

## 2022-05-18 NOTE — Progress Notes (Signed)
Case: [id number] Date/Time: 05/27/22 0845   Procedure: TRANSURETHRAL RESECTION OF BLADDER TUMOR WITH GEMCITABINE   Anesthesia type: Spinal   Pre-op diagnosis: RECURRENT BLADDER CANCER   Location: [facility] / [facility]   Surgeons: Marcine Matar, MD       DISCUSSION: Gerald Valdez is a 89 year old male who presents for surgery listed above on 05/27/22. PMH significant for bladder cancer, COPD, chronic respiratory failure, CAD seen on CT, HTN, CKD, GERD. No complications from prior anesthesia.  COPD Chronic respiratory failure on 3L O2 -Followed by PCP. Stable. Uses inhalers -Per PCP note on 01/22/22:  "Well compensated on 3L O2. Reports he recovers well with SOB with walking. Currently wheelchair-bound working with physical therapy"  CAD -3 vessel coronary calcifications seen on CT chest in 2022. Followed by Cardiology. Last seen on 06/16/21 when he was cleared for prior surgery -Asymptomatic. Medical therapy recommended  HTN -Followed by PCP. Controlled on current regimen  CKD -SCr normal on 05/14/22  VS: BP 122/60   Pulse 74   Temp 36.7 C (Oral)   Resp (!) 21   Ht 5\' 6"  (1.676 m)   Wt 60.2 kg   SpO2 100%   BMI 21.43 kg/m   PROVIDERS: PCP - [name], MD Cardiologist - Kristeen Miss, MD   LABS: Labs reviewed: Acceptable for surgery. (all labs ordered are listed, but only abnormal results are displayed)  Labs Reviewed  BASIC METABOLIC PANEL - Abnormal; Notable for the following components:      Result Value   CO2 35 (*)    Glucose, Bld 108 (*)    BUN 31 (*)    GFR, Estimated 55 (*)    All other components within normal limits  CBC - Abnormal; Notable for the following components:   RBC 3.82 (*)    Hemoglobin 11.5 (*)    HCT 38.0 (*)    All other components within normal limits     IMAGES  CXR 07/01/21:  IMPRESSION: No acute process in the chest.    EKG 12/13/22: Normal sinus rhythm   CV:  Echo 02/11/20 1. Left ventricular ejection  fraction, by estimation, is 55 to 60%. The  left ventricle has normal function. The left ventricle has no regional  wall motion abnormalities. Left ventricular diastolic parameters are  consistent with Grade I diastolic  dysfunction (impaired relaxation).   2. Right ventricular systolic function is normal. The right ventricular  size is normal.   3. The mitral valve is normal in structure. No evidence of mitral valve  regurgitation. No evidence of mitral stenosis.   4. There is a 1 cm x 0.8 cm calcified, mobile density in the LVOT between  the aortic valve and anterior mitral valve leaflet. Consider TEE to better  evaluate. The aortic valve is calcified. There is moderate calcification  of the aortic valve. There is  moderate thickening of the aortic valve. Aortic valve regurgitation is not  visualized. No aortic stenosis is present.   5. The inferior vena cava is normal in size with greater than 50%  respiratory variability, suggesting right atrial pressure of 3 mmHg.   Past Medical History:  Diagnosis Date   Acute duodenal ulcer with bleeding 01/30/2014   Acute post-hemorrhagic anemia 01/30/2014   Anxiety    Aortic atherosclerosis 02/10/2020   Arthritis    Basal cell carcinoma    Cancer    bladder   Chronic kidney disease    COPD exacerbation 02/10/2020   Coronary artery  disease    Dyspnea    Dyspnea on exertion 02/10/2020   Emphysema of lung    Essential hypertension 02/10/2020   GERD (gastroesophageal reflux disease) 02/10/2020   GI bleed 01/30/2014   Hyperlipidemia 02/10/2020    Past Surgical History:  Procedure Laterality Date   BLADDER SURGERY     CATARACT EXTRACTION W/ INTRAOCULAR LENS IMPLANT     CYSTOSCOPY W/ RETROGRADES Bilateral 11/20/2020   Procedure: CYSTOSCOPY WITH RETROGRADE PYELOGRAM;  Surgeon: Marcine Matar, MD;  Location: [facility];  Service: Urology;  Laterality: Bilateral;   CYSTOSCOPY W/ RETROGRADES Right 02/12/2021   Procedure: CYSTOSCOPY WITH  RETROGRADE PYELOGRAM;  Surgeon: Marcine Matar, MD;  Location: [facility];  Service: Urology;  Laterality: Right;   CYSTOSCOPY W/ RETROGRADES Bilateral 06/25/2021   Procedure: CYSTOSCOPY WITH RETROGRADE PYELOGRAM;  Surgeon: Marcine Matar, MD;  Location: [facility];  Service: Urology;  Laterality: Bilateral;   ESOPHAGOGASTRODUODENOSCOPY N/A 01/30/2014   Procedure: ESOPHAGOGASTRODUODENOSCOPY (EGD);  Surgeon: Louis Meckel, MD;  Location: [facility];  Service: Endoscopy;  Laterality: N/A;   TONSILLECTOMY     TRANSURETHRAL RESECTION OF BLADDER TUMOR N/A 02/12/2021   Procedure: REPEAT TRANSURETHRAL RESECTION OF BLADDER TUMOR (TURBT);  Surgeon: Marcine Matar, MD;  Location: [facility];  Service: Urology;  Laterality: N/A;   TRANSURETHRAL RESECTION OF BLADDER TUMOR WITH MITOMYCIN-C N/A 11/20/2020   Procedure: TRANSURETHRAL RESECTION OF BLADDER TUMOR;  Surgeon: Marcine Matar, MD;  Location: [facility];  Service: Urology;  Laterality: N/A;   TRANSURETHRAL RESECTION OF BLADDER TUMOR WITH MITOMYCIN-C N/A 06/25/2021   Procedure: TRANSURETHRAL RESECTION OF BLADDER TUMOR WITH GEMCITABINE;  Surgeon: Marcine Matar, MD;  Location: [facility];  Service: Urology;  Laterality: N/A;  1 HR   TRANSURETHRAL RESECTION OF BLADDER TUMOR WITH MITOMYCIN-C N/A 12/10/2021   Procedure: TRANSURETHRAL RESECTION OF BLADDER TUMOR WITH GEMCITABINE;  Surgeon: Marcine Matar, MD;  Location: [facility];  Service: Urology;  Laterality: N/A;  1 HR    MEDICATIONS:  amLODipine (NORVASC) 5 MG tablet   ascorbic acid (VITAMIN C) 500 MG tablet   escitalopram (LEXAPRO) 10 MG tablet   ferrous sulfate 325 (65 FE) MG tablet   loratadine (CLARITIN) 10 MG tablet   Multiple Vitamin (MULTIVITAMIN WITH MINERALS) TABS tablet   OXYGEN   rosuvastatin (CRESTOR) 20 MG tablet   saccharomyces boulardii (FLORASTOR) 250 MG capsule   No current facility-administered medications for this encounter.    gemcitabine (GEMZAR) chemo syringe for bladder  instillation 2,000 mg    [name] [facility] Short Stay/Anesthesiology [facility] Phone [phone]  05/18/2022 3:17 PM

## 2022-05-26 NOTE — H&P (Signed)
H&P  Chief Complaint: Recurrent bladder cancer  History of Present Illness: Elderly male presents for TURP and possible gemcitabine administration for recurrent high-grade nonmuscle invasive bladder cancer.  He has had several resections prior to this 1, most recently in November 2023.  Past Medical History:  Diagnosis Date   Acute duodenal ulcer with bleeding 01/30/2014   Acute post-hemorrhagic anemia 01/30/2014   Anxiety    Aortic atherosclerosis 02/10/2020   Arthritis    Basal cell carcinoma    Cancer    bladder   Chronic kidney disease    COPD exacerbation 02/10/2020   Coronary artery disease    Dyspnea    Dyspnea on exertion 02/10/2020   Emphysema of lung    Essential hypertension 02/10/2020   GERD (gastroesophageal reflux disease) 02/10/2020   GI bleed 01/30/2014   Hyperlipidemia 02/10/2020    Past Surgical History:  Procedure Laterality Date   BLADDER SURGERY     CATARACT EXTRACTION W/ INTRAOCULAR LENS IMPLANT     CYSTOSCOPY W/ RETROGRADES Bilateral 11/20/2020   Procedure: CYSTOSCOPY WITH RETROGRADE PYELOGRAM;  Surgeon: Marcine Matar, MD;  Location: [facility];  Service: Urology;  Laterality: Bilateral;   CYSTOSCOPY W/ RETROGRADES Right 02/12/2021   Procedure: CYSTOSCOPY WITH RETROGRADE PYELOGRAM;  Surgeon: Marcine Matar, MD;  Location: [facility];  Service: Urology;  Laterality: Right;   CYSTOSCOPY W/ RETROGRADES Bilateral 06/25/2021   Procedure: CYSTOSCOPY WITH RETROGRADE PYELOGRAM;  Surgeon: Marcine Matar, MD;  Location: [facility];  Service: Urology;  Laterality: Bilateral;   ESOPHAGOGASTRODUODENOSCOPY N/A 01/30/2014   Procedure: ESOPHAGOGASTRODUODENOSCOPY (EGD);  Surgeon: Louis Meckel, MD;  Location: [facility];  Service: Endoscopy;  Laterality: N/A;   TONSILLECTOMY     TRANSURETHRAL RESECTION OF BLADDER TUMOR N/A 02/12/2021   Procedure: REPEAT TRANSURETHRAL RESECTION OF BLADDER TUMOR (TURBT);  Surgeon: Marcine Matar, MD;  Location: [facility];  Service:  Urology;  Laterality: N/A;   TRANSURETHRAL RESECTION OF BLADDER TUMOR WITH MITOMYCIN-C N/A 11/20/2020   Procedure: TRANSURETHRAL RESECTION OF BLADDER TUMOR;  Surgeon: Marcine Matar, MD;  Location: [facility];  Service: Urology;  Laterality: N/A;   TRANSURETHRAL RESECTION OF BLADDER TUMOR WITH MITOMYCIN-C N/A 06/25/2021   Procedure: TRANSURETHRAL RESECTION OF BLADDER TUMOR WITH GEMCITABINE;  Surgeon: Marcine Matar, MD;  Location: [facility];  Service: Urology;  Laterality: N/A;  1 HR   TRANSURETHRAL RESECTION OF BLADDER TUMOR WITH MITOMYCIN-C N/A 12/10/2021   Procedure: TRANSURETHRAL RESECTION OF BLADDER TUMOR WITH GEMCITABINE;  Surgeon: Marcine Matar, MD;  Location: [facility];  Service: Urology;  Laterality: N/A;  1 HR    Home Medications:  Allergies as of 05/26/2022   No Known Allergies      Medication List      Notice   Cannot display discharge medications because the patient has not yet been admitted.     Allergies: No Known Allergies  Family History  Problem Relation Age of Onset   Stroke Father     Social History:  reports that he quit smoking about 30 years ago. His smoking use included cigarettes. He started smoking about 77 years ago. He has never used smokeless tobacco. He reports that he does not currently use alcohol. He reports that he does not use drugs.  ROS: A complete review of systems was performed.  All systems are negative except for pertinent findings as noted.  Physical Exam:  Vital signs in last 24 hours: There were no vitals taken for this visit. Constitutional:  Alert and oriented, No acute distress Cardiovascular: Regular rate  Respiratory:  Normal respiratory effort Neurologic: Grossly intact, no focal deficits Psychiatric: Normal mood and affect  I have reviewed prior pt notes  I have reviewed urinalysis results  I have reviewed prior urine culture   Impression/Assessment:  Recurrent high-grade nonmuscle invasive bladder cancer  Plan:   TURBT, possible gemcitabine administration

## 2022-05-27 ENCOUNTER — Encounter (HOSPITAL_COMMUNITY): Payer: Self-pay | Admitting: Urology

## 2022-05-27 ENCOUNTER — Ambulatory Visit (HOSPITAL_COMMUNITY): Payer: Medicare Other | Admitting: Medical

## 2022-05-27 ENCOUNTER — Other Ambulatory Visit: Payer: Self-pay

## 2022-05-27 ENCOUNTER — Ambulatory Visit (HOSPITAL_BASED_OUTPATIENT_CLINIC_OR_DEPARTMENT_OTHER): Payer: Medicare Other | Admitting: Registered Nurse

## 2022-05-27 ENCOUNTER — Ambulatory Visit (HOSPITAL_COMMUNITY)
Admission: RE | Admit: 2022-05-27 | Discharge: 2022-05-28 | Disposition: A | Discharge status: Home / Self Care | Payer: Medicare Other | Source: Ambulatory Visit | Attending: Urology | Admitting: Urology

## 2022-05-27 ENCOUNTER — Encounter (HOSPITAL_COMMUNITY)
Admission: RE | Disposition: A | Discharge status: Home / Self Care | Payer: Self-pay | Source: Ambulatory Visit | Attending: Urology

## 2022-05-27 DIAGNOSIS — N323 Diverticulum of bladder: Secondary | ICD-10-CM | POA: Diagnosis not present

## 2022-05-27 DIAGNOSIS — J439 Emphysema, unspecified: Secondary | ICD-10-CM | POA: Diagnosis not present

## 2022-05-27 DIAGNOSIS — C675 Malignant neoplasm of bladder neck: Secondary | ICD-10-CM | POA: Insufficient documentation

## 2022-05-27 DIAGNOSIS — C678 Malignant neoplasm of overlapping sites of bladder: Secondary | ICD-10-CM | POA: Diagnosis not present

## 2022-05-27 DIAGNOSIS — N189 Chronic kidney disease, unspecified: Secondary | ICD-10-CM | POA: Insufficient documentation

## 2022-05-27 DIAGNOSIS — Z87891 Personal history of nicotine dependence: Secondary | ICD-10-CM | POA: Insufficient documentation

## 2022-05-27 DIAGNOSIS — C679 Malignant neoplasm of bladder, unspecified: Secondary | ICD-10-CM | POA: Diagnosis not present

## 2022-05-27 DIAGNOSIS — I10 Essential (primary) hypertension: Secondary | ICD-10-CM | POA: Diagnosis not present

## 2022-05-27 DIAGNOSIS — I2510 Atherosclerotic heart disease of native coronary artery without angina pectoris: Secondary | ICD-10-CM | POA: Insufficient documentation

## 2022-05-27 DIAGNOSIS — I129 Hypertensive chronic kidney disease with stage 1 through stage 4 chronic kidney disease, or unspecified chronic kidney disease: Secondary | ICD-10-CM | POA: Insufficient documentation

## 2022-05-27 DIAGNOSIS — Z8711 Personal history of peptic ulcer disease: Secondary | ICD-10-CM | POA: Insufficient documentation

## 2022-05-27 DIAGNOSIS — J449 Chronic obstructive pulmonary disease, unspecified: Secondary | ICD-10-CM | POA: Diagnosis not present

## 2022-05-27 HISTORY — PX: TRANSURETHRAL RESECTION OF BLADDER TUMOR WITH MITOMYCIN-C: SHX6459

## 2022-05-27 SURGERY — TRANSURETHRAL RESECTION OF BLADDER TUMOR WITH MITOMYCIN-C
Anesthesia: General

## 2022-05-27 MED ORDER — FERROUS SULFATE 325 (65 FE) MG PO TABS
325.0000 mg | ORAL_TABLET | Freq: Every day | ORAL | Status: DC
  Administered 2022-05-28: 325 mg via ORAL
  Filled 2022-05-27: qty 1

## 2022-05-27 MED ORDER — LIDOCAINE HCL (PF) 2 % IJ SOLN
INTRAMUSCULAR | Status: AC
  Filled 2022-05-27: qty 5

## 2022-05-27 MED ORDER — DEXAMETHASONE SODIUM PHOSPHATE 10 MG/ML IJ SOLN
INTRAMUSCULAR | Status: DC | PRN
  Administered 2022-05-27: 10 mg via INTRAVENOUS

## 2022-05-27 MED ORDER — ROCURONIUM BROMIDE 10 MG/ML (PF) SYRINGE
PREFILLED_SYRINGE | INTRAVENOUS | Status: DC | PRN
  Administered 2022-05-27: 50 mg via INTRAVENOUS

## 2022-05-27 MED ORDER — ORAL CARE MOUTH RINSE: 15.0000 mL | Freq: Once | OROMUCOSAL | Status: AC

## 2022-05-27 MED ORDER — OXYCODONE HCL 5 MG PO TABS: 5.0000 mg | ORAL_TABLET | Freq: Once | ORAL | Status: DC | PRN

## 2022-05-27 MED ORDER — ONDANSETRON HCL 4 MG/2ML IJ SOLN
INTRAMUSCULAR | Status: AC
  Filled 2022-05-27: qty 2

## 2022-05-27 MED ORDER — ROCURONIUM BROMIDE 10 MG/ML (PF) SYRINGE
PREFILLED_SYRINGE | INTRAVENOUS | Status: AC
  Filled 2022-05-27: qty 10

## 2022-05-27 MED ORDER — PROPOFOL 10 MG/ML IV BOLUS
INTRAVENOUS | Status: AC
  Filled 2022-05-27: qty 20

## 2022-05-27 MED ORDER — LACTATED RINGERS IV SOLN: INTRAVENOUS | Status: DC

## 2022-05-27 MED ORDER — ONDANSETRON HCL 4 MG/2ML IJ SOLN
INTRAMUSCULAR | Status: DC | PRN
  Administered 2022-05-27: 4 mg via INTRAVENOUS

## 2022-05-27 MED ORDER — ESCITALOPRAM OXALATE 10 MG PO TABS
10.0000 mg | ORAL_TABLET | Freq: Every day | ORAL | Status: DC
  Administered 2022-05-27 – 2022-05-28 (×2): 10 mg via ORAL
  Filled 2022-05-27 (×2): qty 1

## 2022-05-27 MED ORDER — SODIUM CHLORIDE 0.45 % IV SOLN: INTRAVENOUS | Status: DC

## 2022-05-27 MED ORDER — AMLODIPINE BESYLATE 5 MG PO TABS
5.0000 mg | ORAL_TABLET | Freq: Every day | ORAL | Status: DC
  Administered 2022-05-28: 5 mg via ORAL
  Filled 2022-05-27: qty 1

## 2022-05-27 MED ORDER — ROSUVASTATIN CALCIUM 20 MG PO TABS
20.0000 mg | ORAL_TABLET | Freq: Every day | ORAL | Status: DC
  Administered 2022-05-27: 20 mg via ORAL
  Filled 2022-05-27: qty 1

## 2022-05-27 MED ORDER — FENTANYL CITRATE PF 50 MCG/ML IJ SOSY: 25.0000 ug | PREFILLED_SYRINGE | INTRAMUSCULAR | Status: DC | PRN

## 2022-05-27 MED ORDER — SODIUM CHLORIDE 0.9 % IR SOLN
Status: DC | PRN
  Administered 2022-05-27 (×2): 3000 mL
  Administered 2022-05-27 (×3): 6000 mL
  Administered 2022-05-27: 3000 mL

## 2022-05-27 MED ORDER — CEFAZOLIN SODIUM-DEXTROSE 2-4 GM/100ML-% IV SOLN
INTRAVENOUS | Status: AC
  Filled 2022-05-27: qty 100

## 2022-05-27 MED ORDER — DEXAMETHASONE SODIUM PHOSPHATE 10 MG/ML IJ SOLN
INTRAMUSCULAR | Status: AC
  Filled 2022-05-27: qty 1

## 2022-05-27 MED ORDER — LORATADINE 10 MG PO TABS
10.0000 mg | ORAL_TABLET | Freq: Every day | ORAL | Status: DC
  Administered 2022-05-28: 10 mg via ORAL
  Filled 2022-05-27: qty 1

## 2022-05-27 MED ORDER — 0.9 % SODIUM CHLORIDE (POUR BTL) OPTIME
TOPICAL | Status: DC | PRN
  Administered 2022-05-27 (×3): 1000 mL

## 2022-05-27 MED ORDER — LIDOCAINE 2% (20 MG/ML) 5 ML SYRINGE
INTRAMUSCULAR | Status: DC | PRN
  Administered 2022-05-27: 60 mg via INTRAVENOUS

## 2022-05-27 MED ORDER — FENTANYL CITRATE (PF) 100 MCG/2ML IJ SOLN
INTRAMUSCULAR | Status: AC
  Filled 2022-05-27: qty 2

## 2022-05-27 MED ORDER — PHENYLEPHRINE HCL-NACL 20-0.9 MG/250ML-% IV SOLN
INTRAVENOUS | Status: DC | PRN
  Administered 2022-05-27: 25 ug/min via INTRAVENOUS

## 2022-05-27 MED ORDER — ONDANSETRON HCL 4 MG/2ML IJ SOLN: 4.0000 mg | Freq: Four times a day (QID) | INTRAMUSCULAR | Status: DC | PRN

## 2022-05-27 MED ORDER — EPHEDRINE 5 MG/ML INJ
INTRAVENOUS | Status: AC
  Filled 2022-05-27: qty 5

## 2022-05-27 MED ORDER — CEFAZOLIN SODIUM-DEXTROSE 2-3 GM-%(50ML) IV SOLR
INTRAVENOUS | Status: DC | PRN
  Administered 2022-05-27: 2 g via INTRAVENOUS

## 2022-05-27 MED ORDER — AMOXICILLIN-POT CLAVULANATE 500-125 MG PO TABS
1.0000 | ORAL_TABLET | Freq: Three times a day (TID) | ORAL | Status: DC
  Administered 2022-05-27 – 2022-05-28 (×3): 1 via ORAL
  Filled 2022-05-27 (×4): qty 1

## 2022-05-27 MED ORDER — PHENYLEPHRINE 80 MCG/ML (10ML) SYRINGE FOR IV PUSH (FOR BLOOD PRESSURE SUPPORT)
PREFILLED_SYRINGE | INTRAVENOUS | Status: DC | PRN
  Administered 2022-05-27: 160 ug via INTRAVENOUS
  Administered 2022-05-27: 80 ug via INTRAVENOUS

## 2022-05-27 MED ORDER — OXYCODONE HCL 5 MG/5ML PO SOLN: 5.0000 mg | Freq: Once | ORAL | Status: DC | PRN

## 2022-05-27 MED ORDER — SUGAMMADEX SODIUM 200 MG/2ML IV SOLN
INTRAVENOUS | Status: DC | PRN
  Administered 2022-05-27: 120 mg via INTRAVENOUS
  Administered 2022-05-27: 80 mg via INTRAVENOUS

## 2022-05-27 MED ORDER — CHLORHEXIDINE GLUCONATE 0.12 % MT SOLN
15.0000 mL | Freq: Once | OROMUCOSAL | Status: AC
  Administered 2022-05-27: 15 mL via OROMUCOSAL

## 2022-05-27 MED ORDER — SENNA 8.6 MG PO TABS
1.0000 | ORAL_TABLET | Freq: Two times a day (BID) | ORAL | Status: DC
  Administered 2022-05-27 – 2022-05-28 (×2): 8.6 mg via ORAL
  Filled 2022-05-27 (×2): qty 1

## 2022-05-27 MED ORDER — PROPOFOL 10 MG/ML IV BOLUS
INTRAVENOUS | Status: DC | PRN
  Administered 2022-05-27: 70 mg via INTRAVENOUS

## 2022-05-27 MED ORDER — FENTANYL CITRATE (PF) 100 MCG/2ML IJ SOLN
INTRAMUSCULAR | Status: DC | PRN
  Administered 2022-05-27: 50 ug via INTRAVENOUS

## 2022-05-27 SURGICAL SUPPLY — 19 items
BAG DRN RND TRDRP ANRFLXCHMBR (UROLOGICAL SUPPLIES)
BAG URINE DRAIN 2000ML AR STRL (UROLOGICAL SUPPLIES) IMPLANT
BAG URO CATCHER STRL LF (MISCELLANEOUS) ×1 IMPLANT
CATH FOLEY 2WAY SLVR 30CC 24FR (CATHETERS) IMPLANT
DRAPE FOOT SWITCH (DRAPES) ×1 IMPLANT
EVACUATOR MICROVAS BLADDER (UROLOGICAL SUPPLIES) IMPLANT
GLOVE SURG LX STRL 8.0 MICRO (GLOVE) ×1 IMPLANT
GOWN STRL REUS W/ TWL XL LVL3 (GOWN DISPOSABLE) ×1 IMPLANT
GOWN STRL REUS W/TWL XL LVL3 (GOWN DISPOSABLE) ×1
KIT TURNOVER KIT A (KITS) IMPLANT
LOOP CUT BIPOLAR 24F LRG (ELECTROSURGICAL) ×1 IMPLANT
MANIFOLD NEPTUNE II (INSTRUMENTS) ×1 IMPLANT
NDL SAFETY ECLIP 18X1.5 (MISCELLANEOUS) ×1 IMPLANT
PACK CYSTO (CUSTOM PROCEDURE TRAY) ×1 IMPLANT
SYR TOOMEY IRRIG 70ML (MISCELLANEOUS)
SYRINGE TOOMEY IRRIG 70ML (MISCELLANEOUS) IMPLANT
TUBING CONNECTING 10 (TUBING) ×1 IMPLANT
TUBING UROLOGY SET (TUBING) ×1 IMPLANT
WATER STERILE IRR 3000ML UROMA (IV SOLUTION) ×1 IMPLANT

## 2022-05-27 NOTE — Transfer of Care (Signed)
Immediate Anesthesia Transfer of Care Note  Patient: Gerald Valdez  Procedure(s) Performed: TRANSURETHRAL RESECTION OF BLADDER TUMOR  Patient Location: PACU  Anesthesia Type:General  Level of Consciousness: awake, alert , oriented, and patient cooperative  Airway & Oxygen Therapy: Patient Spontanous Breathing and Patient connected to face mask oxygen  Post-op Assessment: Report given to RN, Post -op Vital signs reviewed and stable, and Patient moving all extremities  Post vital signs: Reviewed and stable  Last Vitals:  Vitals Value Taken Time  BP 124/61 05/27/22 1101  Temp    Pulse 73 05/27/22 1103  Resp 20 05/27/22 1103  SpO2 100 % 05/27/22 1103  Vitals shown include unvalidated device data.  Last Pain:  Vitals:   05/27/22 0716  TempSrc:   PainSc: 0-No pain         Complications: No notable events documented.

## 2022-05-27 NOTE — Op Note (Addendum)
Preoperative diagnosis: Recurrent urothelial carcinoma of the bladder  Postoperative diagnosis: Same  Principal procedure: Cystoscopy, transurethral resection of bladder tumor (diameter approximately 10 cm)  Surgeon: Retta Diones  Anesthesia: General endotracheal  Complications: None  Specimen: Bladder tumor fragments to, to pathology  Estimated blood loss: Less than 200 mL  Drains: None  Indications: 89 year old male with recurrent high-grade nonmuscle invasive bladder cancer.  This has been resistant to BCG treatment.  Recent cystoscopy revealed multiple large tumors throughout the bladder, mainly in the lower bladder neck area.  He presents at this time for repeat resection and possible gemcitabine administration.  The patient and his wife have been through this several times before, they are aware of the risk, complications and expected outcomes.  They desire to proceed.  Findings: Urethra was normal.  Prostate minimally obstructive.  Large papillary tumor burden throughout the bladder.  At the bladder neck, both anteriorly, bilaterally and within the trigonal area there was a large amount of this obscuring both ureteral orifices.  He had a 4 cm tumor on the right bladder wall, a 3 cm tumor left anterolateral bladder wall, and a 4 to 5 cm tumor at the bladder dome.  Multiple other small satellite lesions were noted.  Description of procedure: Patient properly identified in the holding area.  He received preoperative Ancef.  Taken to the operating room where general anesthetic was administered using the endotracheal device and paralysis.  26 French resectoscope sheath passed using the visual obturator.  Circumferential inspection was performed revealing the above-mentioned findings.  There were several widemouth diverticula posteriorly with tumor in them.  I started resection in the trigonal region.  There was a large tumor volume here.  Resection was carried into the several diverticula  noted.  Resection was actually carried into the bladder neck area posterior laterally.  When all this tumor was removed, the remaining bladder neck tumors were resected, first anteriorly, then on the right and the left.  Resection carried well into the muscularis layer.  Following this, the relatively large tumor in the right bladder wall was resected.  Several small satellite lesions were around this.  The left bladder wall and the dome tumors were resected.  Total resection time was approximately 1-1/2 hours.  Because of the large tumor burden and the likelihood of small perforations, I did not think gemcitabine should be administered.  At this point all resected sites were cauterized.  Multiple tumor fragments were rinsed from the bladder with the evacuator and sent for pathology labeled "bladder tumor".  Inspection revealed adequate hemostasis.  Scope was removed.  22 French hematuria 3-way coud catheter was placed.  Balloon filled with 30 cc of water.  Irrigation port plugged, catheter then hooked to dependent drainage.  At this point the procedure was terminated.  The patient was awakened, taken to the PACU in stable condition having tolerated the procedure well.

## 2022-05-27 NOTE — Anesthesia Postprocedure Evaluation (Signed)
Anesthesia Post Note  Patient: Gerald Valdez  Procedure(s) Performed: TRANSURETHRAL RESECTION OF BLADDER TUMOR     Patient location during evaluation: PACU Anesthesia Type: General Level of consciousness: awake and alert Pain management: pain level controlled Vital Signs Assessment: post-procedure vital signs reviewed and stable Respiratory status: spontaneous breathing, nonlabored ventilation, respiratory function stable and patient connected to nasal cannula oxygen Cardiovascular status: blood pressure returned to baseline and stable Postop Assessment: no apparent nausea or vomiting Anesthetic complications: no   No notable events documented.  Last Vitals:  Vitals:   05/27/22 1418 05/27/22 1435  BP: 136/63 136/63  Pulse: 96 96  Resp: 20 20  Temp: 36.5 C 36.5 C  SpO2: 91%     Last Pain:  Vitals:   05/27/22 1435  TempSrc: Oral  PainSc:                  Marco Raper S

## 2022-05-27 NOTE — Anesthesia Procedure Notes (Signed)
Procedure Name: Intubation Date/Time: 05/27/2022 8:59 AM  Performed by: Elisabeth Cara, CRNAPre-anesthesia Checklist: Patient identified, Suction available, Patient being monitored, Timeout performed and Emergency Drugs available Patient Re-evaluated:Patient Re-evaluated prior to induction Oxygen Delivery Method: Circle system utilized Preoxygenation: Pre-oxygenation with 100% oxygen Induction Type: IV induction Ventilation: Mask ventilation without difficulty and Oral airway inserted - appropriate to patient size Laryngoscope Size: Mac and 4 Grade View: Grade I Tube type: Oral Tube size: 7.5 mm Number of attempts: 1 Airway Equipment and Method: Stylet Placement Confirmation: ETT inserted through vocal cords under direct vision, positive ETCO2 and breath sounds checked- equal and bilateral Secured at: 21 cm Tube secured with: Tape Dental Injury: Teeth and Oropharynx as per pre-operative assessment

## 2022-05-27 NOTE — Interval H&P Note (Signed)
History and Physical Interval Note:  05/27/2022 7:31 AM  Gerald Valdez  has presented today for surgery, with the diagnosis of RECURRENT BLADDER CANCER.  The various methods of treatment have been discussed with the patient and family. After consideration of risks, benefits and other options for treatment, the patient has consented to  Procedure(s): TRANSURETHRAL RESECTION OF BLADDER TUMOR WITH GEMCITABINE (N/A) as a surgical intervention.  The patient's history has been reviewed, patient examined, no change in status, stable for surgery.  I have reviewed the patient's chart and labs.  Questions were answered to the patient's satisfaction.     Bertram Millard Remo Kirschenmann

## 2022-05-27 NOTE — Discharge Instructions (Addendum)

## 2022-05-28 ENCOUNTER — Encounter (HOSPITAL_COMMUNITY): Payer: Self-pay | Admitting: Urology

## 2022-05-28 DIAGNOSIS — N323 Diverticulum of bladder: Secondary | ICD-10-CM | POA: Diagnosis not present

## 2022-05-28 DIAGNOSIS — I2510 Atherosclerotic heart disease of native coronary artery without angina pectoris: Secondary | ICD-10-CM | POA: Diagnosis not present

## 2022-05-28 DIAGNOSIS — N189 Chronic kidney disease, unspecified: Secondary | ICD-10-CM | POA: Diagnosis not present

## 2022-05-28 DIAGNOSIS — J439 Emphysema, unspecified: Secondary | ICD-10-CM | POA: Diagnosis not present

## 2022-05-28 DIAGNOSIS — I129 Hypertensive chronic kidney disease with stage 1 through stage 4 chronic kidney disease, or unspecified chronic kidney disease: Secondary | ICD-10-CM | POA: Diagnosis not present

## 2022-05-28 DIAGNOSIS — C675 Malignant neoplasm of bladder neck: Secondary | ICD-10-CM | POA: Diagnosis not present

## 2022-05-28 NOTE — Progress Notes (Signed)
  Transition of Care [facility]) Screening Note   Patient Details  Name: Gerald Valdez Date of Birth: 07-27-1928   [facility] CM/SW Contact:    Adrian Prows, RN Phone Number: 05/28/2022, 8:52 AM    Transition of Care Department [facility]) has reviewed patient and no TOC needs have been identified at this time. We will continue to monitor patient advancement through interdisciplinary progression rounds. If new patient transition needs arise, please place a TOC consult.

## 2022-05-28 NOTE — Progress Notes (Signed)
Patient discharging home with family.  Patient and wife knowledgeable of foley care and properly demonstrated bag change.  Reviewed AVS and medications.  No questions at this time.  IV removed - WNL.  Patient in NAD awaiting arrival of daughter for discharge home

## 2022-05-28 NOTE — Plan of Care (Signed)
  Problem: Education: Goal: Knowledge of the prescribed therapeutic regimen will improve Outcome: Completed/Met   Problem: Bowel/Gastric: Goal: Gastrointestinal status for postoperative course will improve Outcome: Completed/Met   Problem: Health Behavior/Discharge Planning: Goal: Identification of resources available to assist in meeting health care needs will improve Outcome: Completed/Met   Problem: Skin Integrity: Goal: Demonstration of wound healing without infection will improve Outcome: Completed/Met   Problem: Urinary Elimination: Goal: Ability to avoid or minimize complications of infection will improve Outcome: Completed/Met   Problem: Education: Goal: Knowledge of General Education information will improve Description: Including pain rating scale, medication(s)/side effects and non-pharmacologic comfort measures Outcome: Completed/Met   Problem: Health Behavior/Discharge Planning: Goal: Ability to manage health-related needs will improve Outcome: Completed/Met   Problem: Clinical Measurements: Goal: Ability to maintain clinical measurements within normal limits will improve Outcome: Completed/Met Goal: Will remain free from infection Outcome: Completed/Met Goal: Diagnostic test results will improve Outcome: Completed/Met Goal: Respiratory complications will improve Outcome: Completed/Met Goal: Cardiovascular complication will be avoided Outcome: Completed/Met   Problem: Activity: Goal: Risk for activity intolerance will decrease Outcome: Completed/Met   Problem: Nutrition: Goal: Adequate nutrition will be maintained Outcome: Completed/Met   Problem: Coping: Goal: Level of anxiety will decrease Outcome: Completed/Met   Problem: Elimination: Goal: Will not experience complications related to bowel motility Outcome: Completed/Met Goal: Will not experience complications related to urinary retention Outcome: Completed/Met   Problem: Pain  Managment: Goal: General experience of comfort will improve Outcome: Completed/Met   Problem: Safety: Goal: Ability to remain free from injury will improve Outcome: Completed/Met   Problem: Skin Integrity: Goal: Risk for impaired skin integrity will decrease Outcome: Completed/Met   

## 2022-05-30 LAB — SURGICAL PATHOLOGY

## 2022-06-04 DIAGNOSIS — C678 Malignant neoplasm of overlapping sites of bladder: Secondary | ICD-10-CM | POA: Diagnosis not present

## 2022-06-16 DIAGNOSIS — C678 Malignant neoplasm of overlapping sites of bladder: Secondary | ICD-10-CM | POA: Diagnosis not present

## 2022-07-13 IMAGING — CT CT ABD-PELV W/O CM
2 of 4 series · 16 of 46 positions shown, 18 images · non-contrast
Comparison: None

CLINICAL DATA: Kidney failure, acute. Recent surgery for bladder
cancer. Question infection or sepsis.



[Series 2: axial st · axial · 0.75mm/px · z∈[+821,+1186]mm · 13 of 83 slices shown, 15 images]
[im 5/83  soft-tissue]
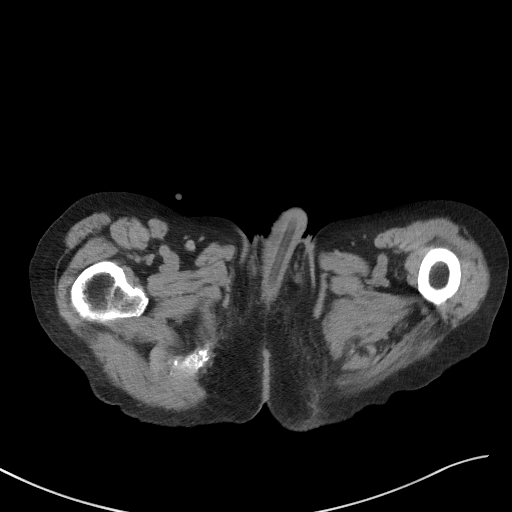
[im 5/83  bone]
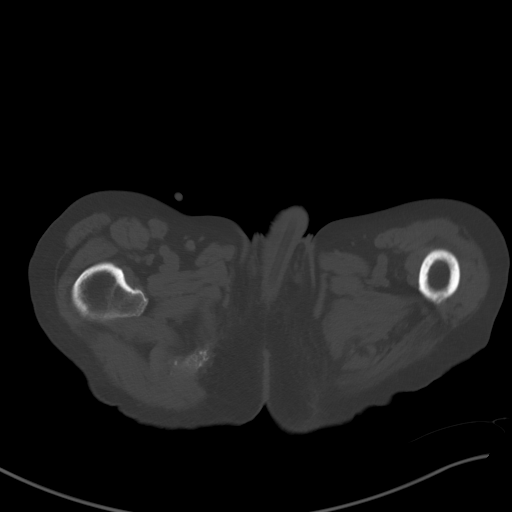
[im 13/83  soft-tissue]
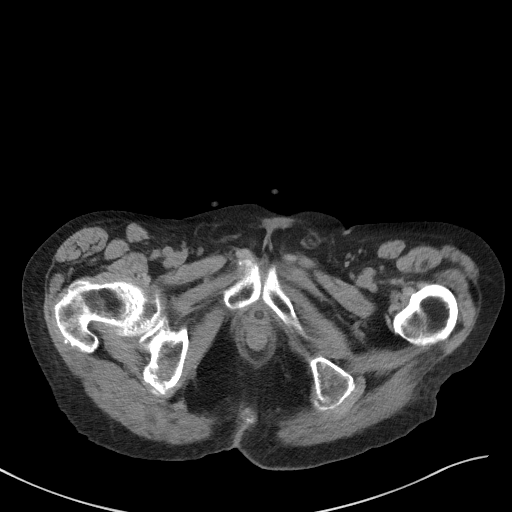
[im 18/83  soft-tissue]
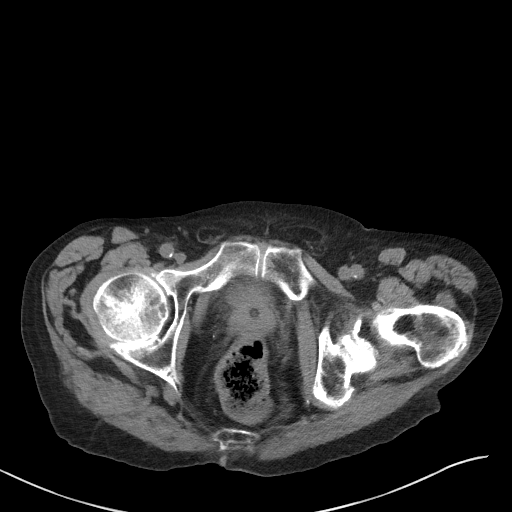
[im 22/83  soft-tissue]
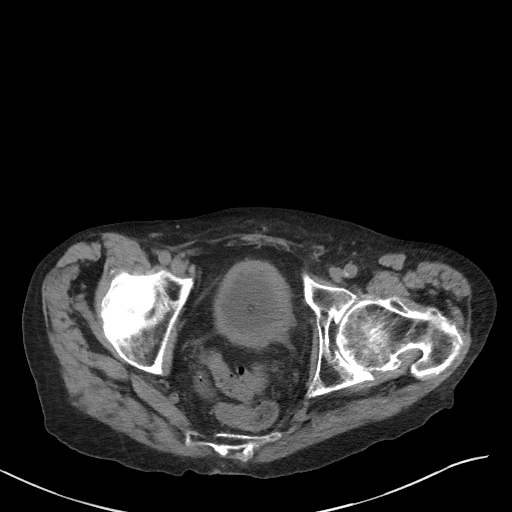
[im 31/83  soft-tissue]
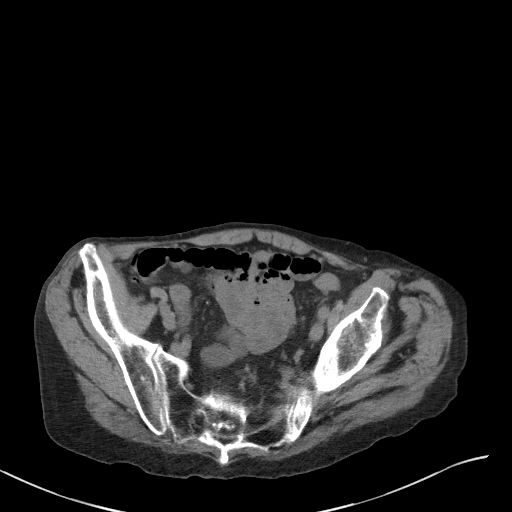
[im 35/83  soft-tissue]
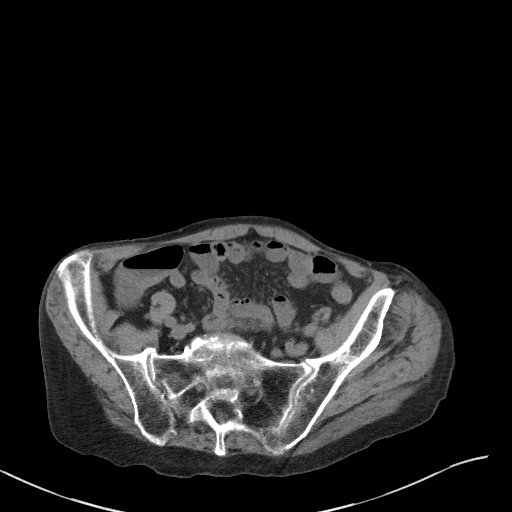
[im 44/83  soft-tissue]
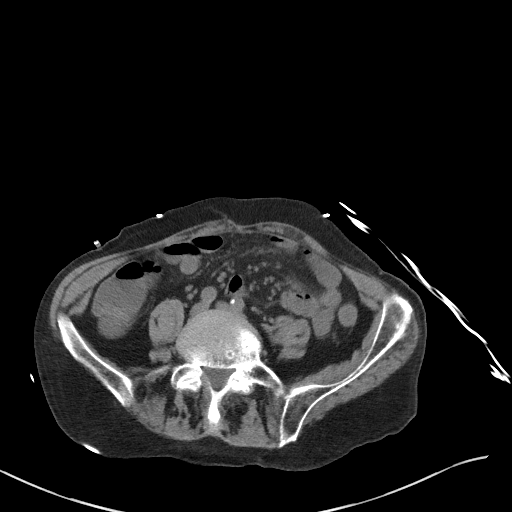
[im 48/83  soft-tissue]
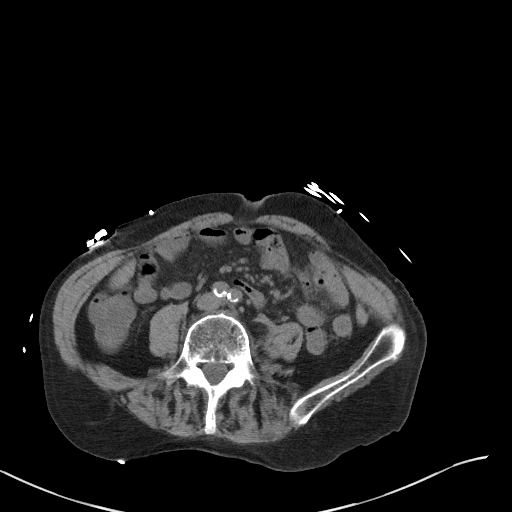
[im 52/83  soft-tissue]
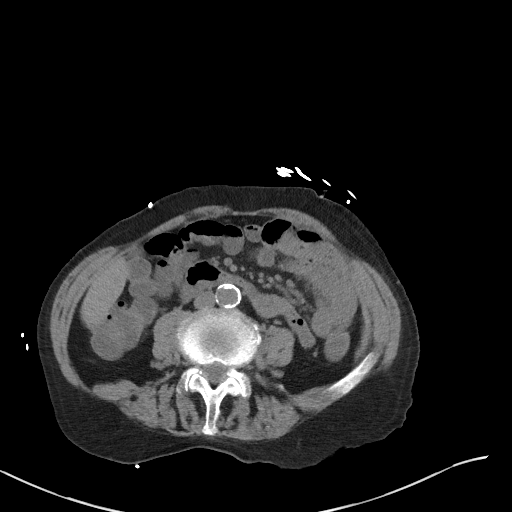
[im 52/83  bone]
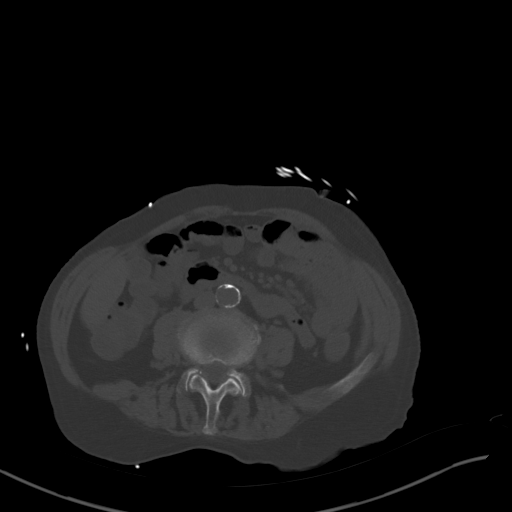
[im 61/83  soft-tissue]
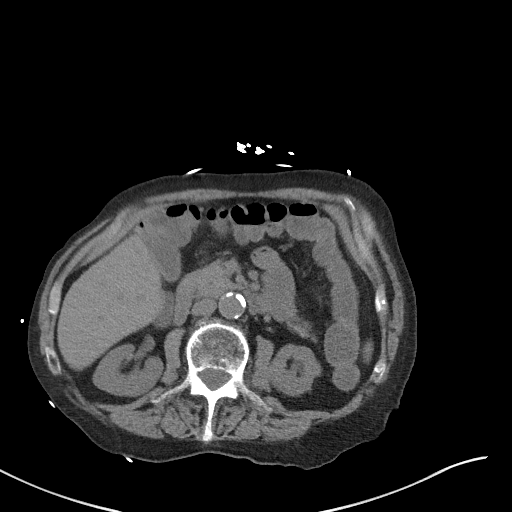
[im 65/83  soft-tissue]
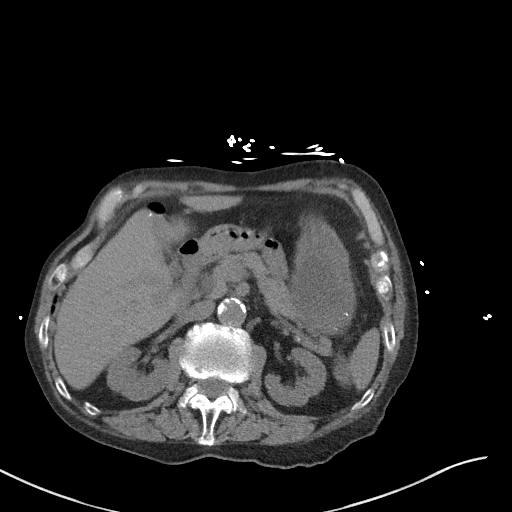
[im 70/83  soft-tissue]
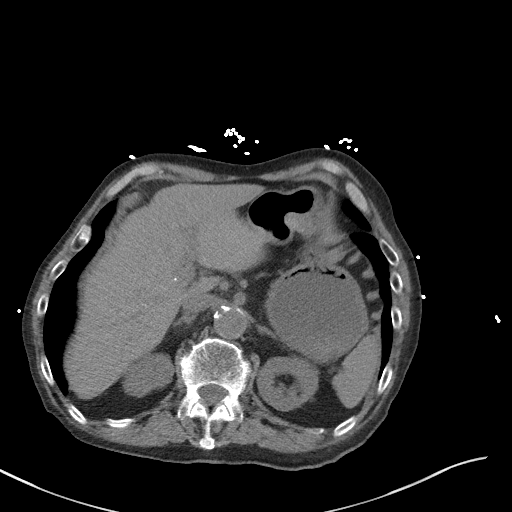
[im 78/83  soft-tissue]
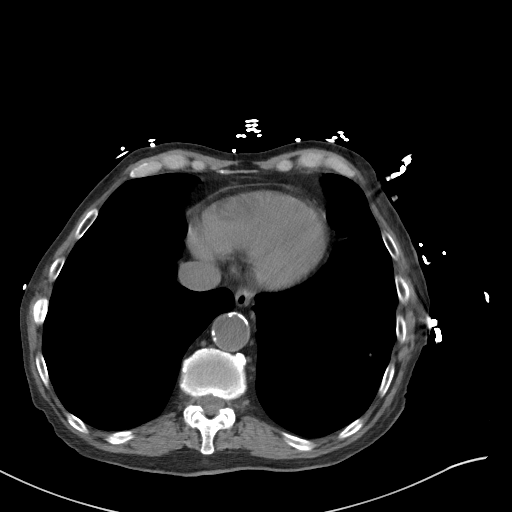

[Series 5: coronal st · coronal · 0.71mm/px · 3 of 88 slices shown]
[im 30/88  soft-tissue]
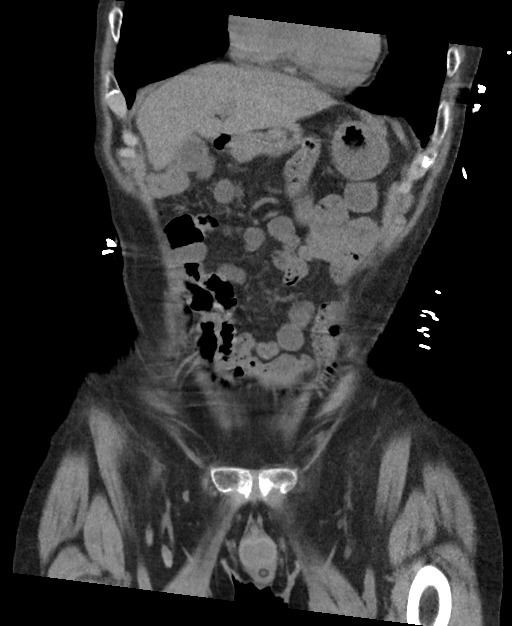
[im 39/88  soft-tissue]
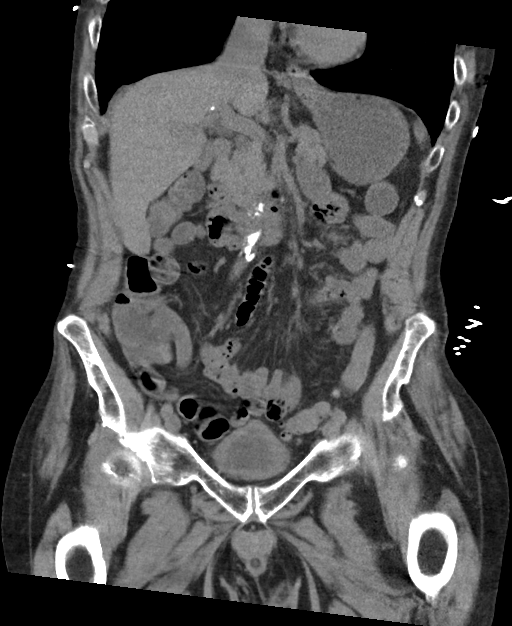
[im 49/88  soft-tissue]
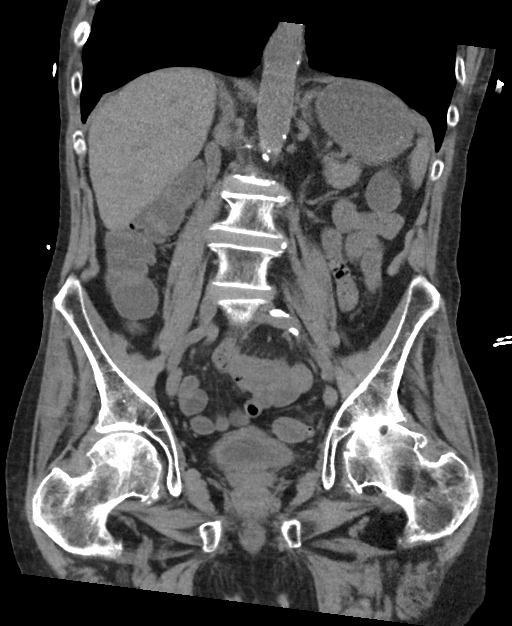

[16 of 46 positions shown; findings below may reference images not displayed]

FINDINGS: Lower chest: Mild scarring at the lung bases. No infiltrate,
collapse or effusion. Some emphysematous changes.

Hepatobiliary: Scattered calcified granulomas throughout the liver.
No evidence of mass or biliary obstruction.

Pancreas: Normal

Spleen: Normal

Adrenals/Urinary Tract: Adrenal glands are normal. Kidneys are
normal. No cyst, mass, stone or hydronephrosis. Catheter in place
within the bladder. The bladder is largely collapsed. No
complicating feature relating to recent surgery.

Stomach/Bowel: Stomach and small intestine are normal. Normal
appendix. No acute or significant: Finding. Diverticulosis of the
sigmoid colon without visible diverticulitis.

Vascular/Lymphatic: Aortic atherosclerosis. No aneurysm. IVC is
normal. No adenopathy.

Reproductive: Normal

Other: No free fluid or air.

Musculoskeletal: No significant finding. Ordinary osteoarthritis of
the hip joints.
IMPRESSION: Foley catheter within the bladder, which is largely collapsed. No
complicating feature seen relating to recent bladder surgery.

No other acute abdominal or pelvic organ finding.

Diverticulosis of the sigmoid colon without visible diverticulitis.

Aortic atherosclerosis.

## 2022-07-13 IMAGING — CR DG CHEST 2V
3 series · 3 of 3 positions shown · non-contrast
Comparison: 02/10/2020

CLINICAL DATA: Suspected sepsis

EXAM:
CHEST - 2 VIEW

[w chest lat]
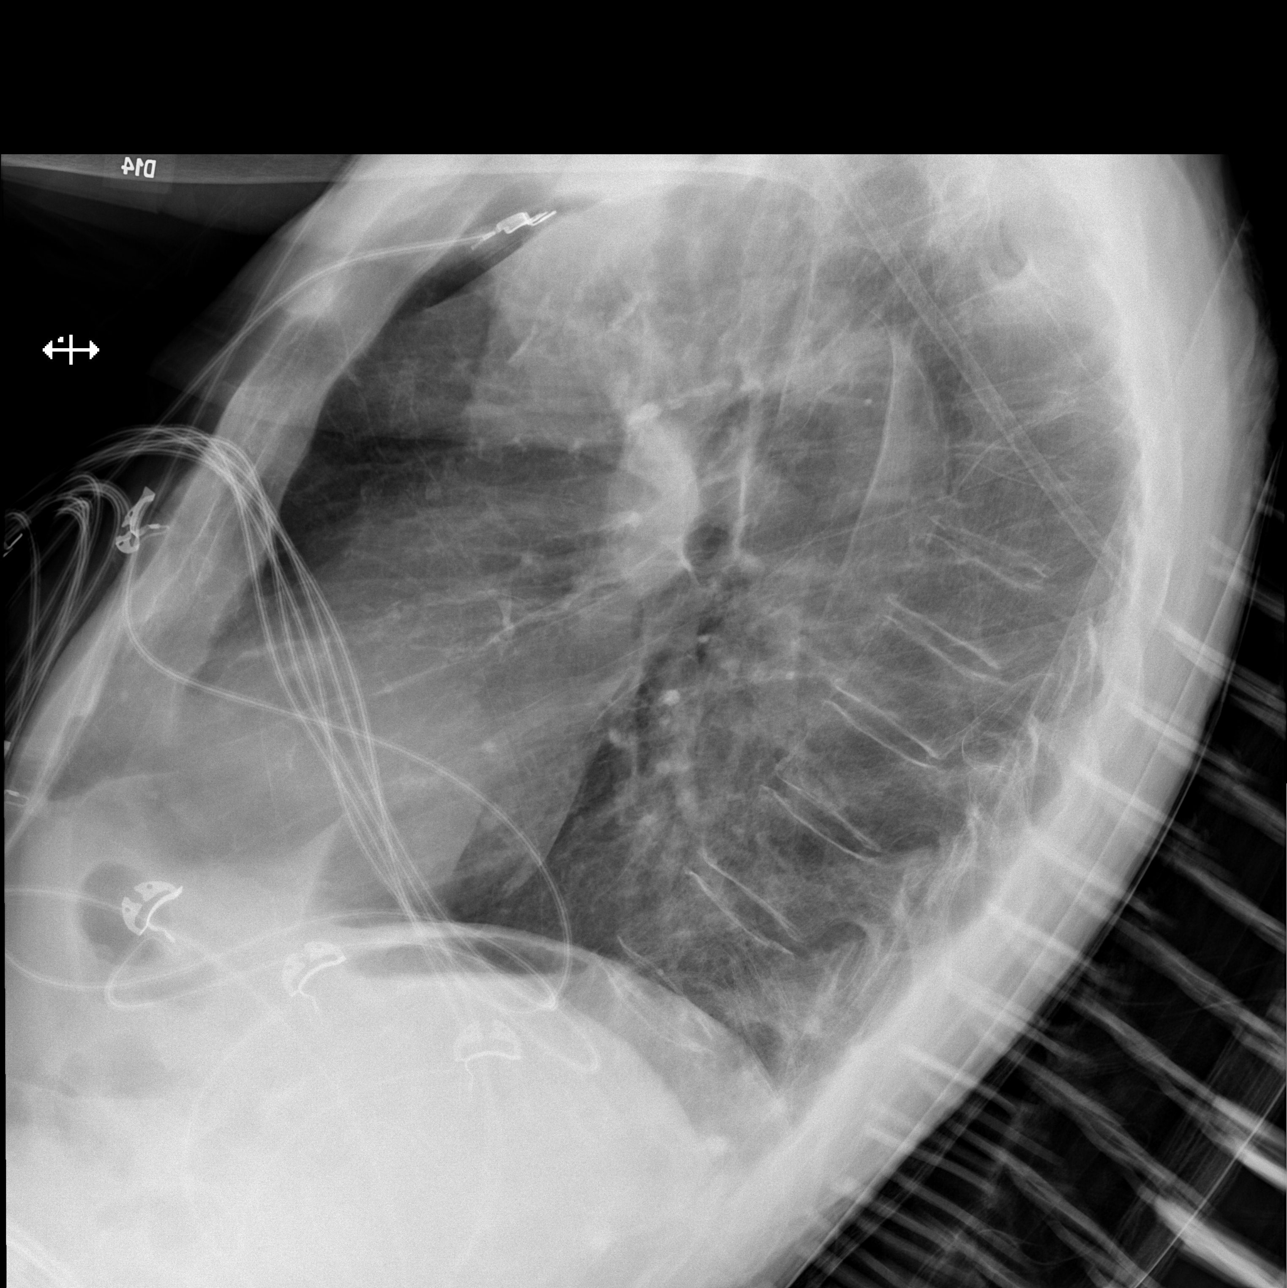

[x chest ap (1 of 2)]
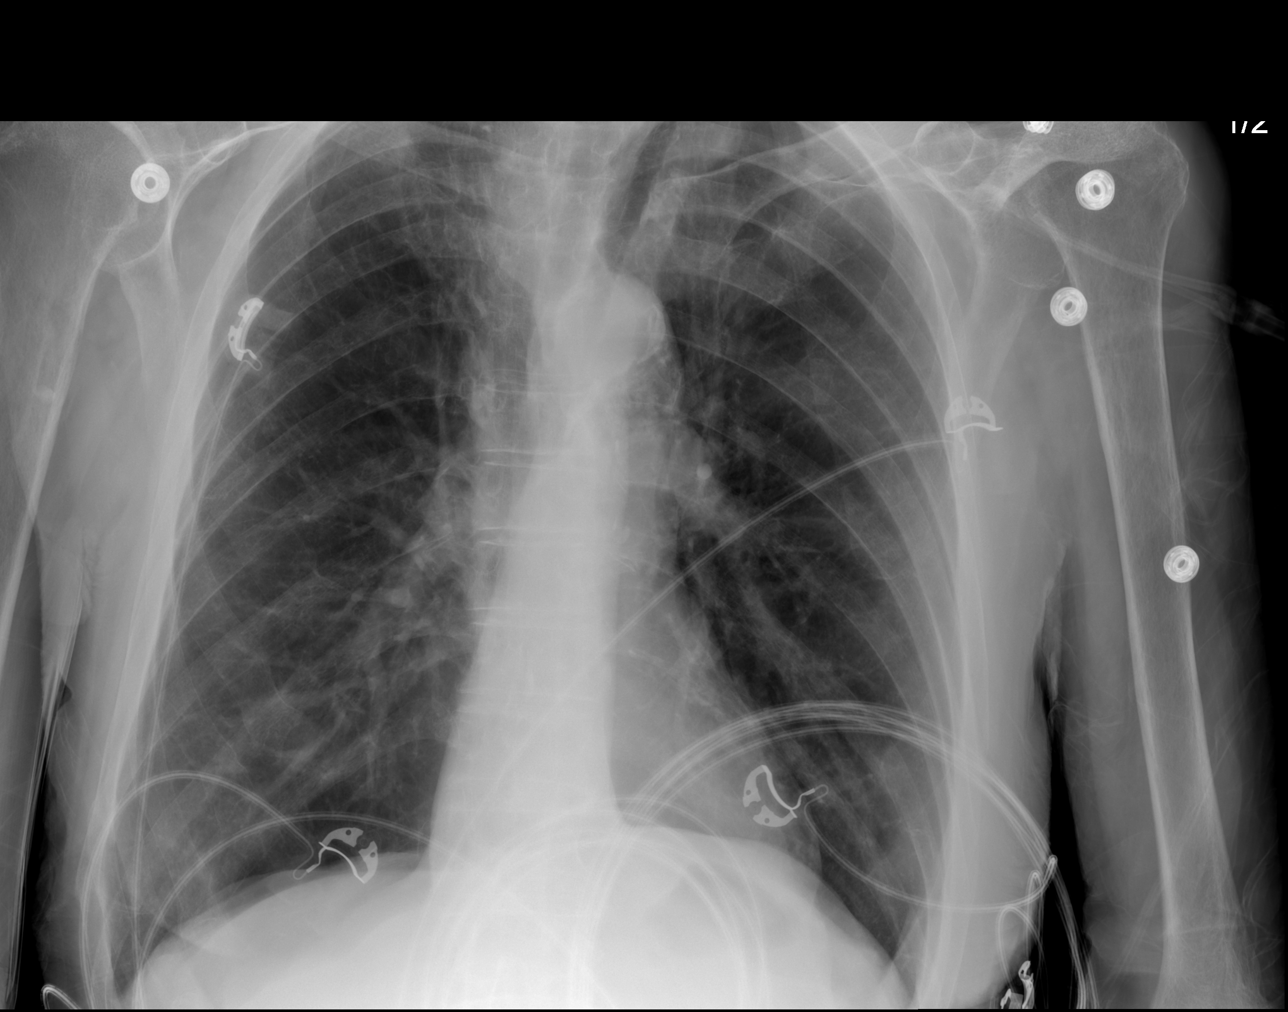

[x chest ap (2 of 2)]
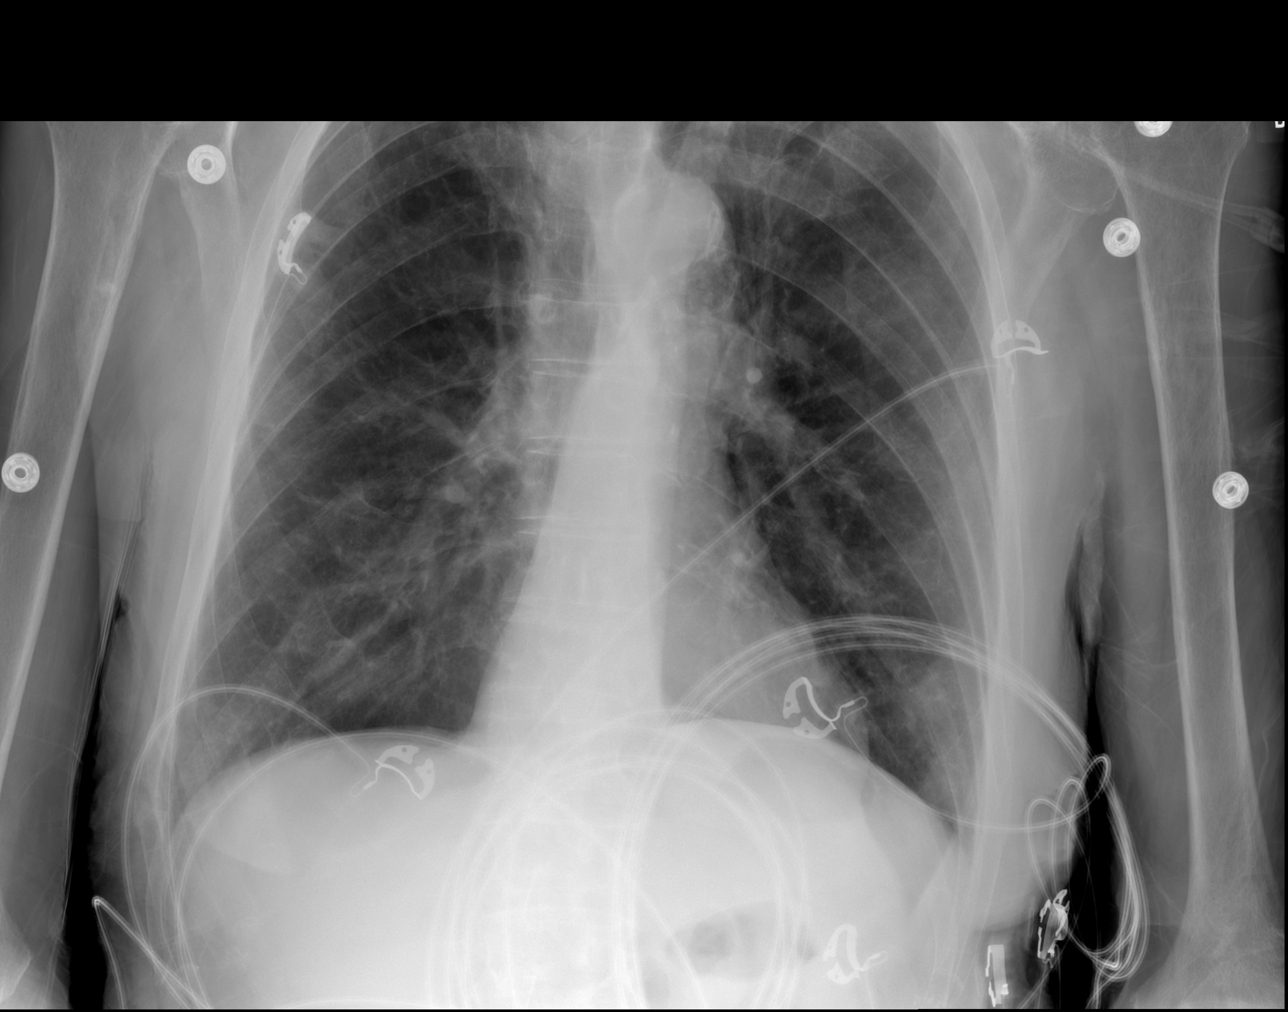

[3 of 3 positions shown; findings below may reference images not displayed]

FINDINGS: Emphysema. No new consolidation or edema. No pleural effusion.
Normal heart size. No acute osseous abnormality.
IMPRESSION: No acute process in the chest.

## 2022-07-14 DIAGNOSIS — D649 Anemia, unspecified: Secondary | ICD-10-CM | POA: Diagnosis not present

## 2022-07-14 DIAGNOSIS — I700 Atherosclerosis of aorta: Secondary | ICD-10-CM | POA: Diagnosis not present

## 2022-07-14 DIAGNOSIS — C679 Malignant neoplasm of bladder, unspecified: Secondary | ICD-10-CM | POA: Diagnosis not present

## 2022-07-14 DIAGNOSIS — N1832 Chronic kidney disease, stage 3b: Secondary | ICD-10-CM | POA: Diagnosis not present

## 2022-07-14 DIAGNOSIS — J9601 Acute respiratory failure with hypoxia: Secondary | ICD-10-CM | POA: Diagnosis not present

## 2022-07-14 DIAGNOSIS — I2510 Atherosclerotic heart disease of native coronary artery without angina pectoris: Secondary | ICD-10-CM | POA: Diagnosis not present

## 2022-07-14 DIAGNOSIS — N179 Acute kidney failure, unspecified: Secondary | ICD-10-CM | POA: Diagnosis not present

## 2022-07-14 DIAGNOSIS — J441 Chronic obstructive pulmonary disease with (acute) exacerbation: Secondary | ICD-10-CM | POA: Diagnosis not present

## 2022-07-14 DIAGNOSIS — N390 Urinary tract infection, site not specified: Secondary | ICD-10-CM | POA: Diagnosis not present

## 2022-07-14 DIAGNOSIS — R5383 Other fatigue: Secondary | ICD-10-CM | POA: Diagnosis not present

## 2022-07-14 DIAGNOSIS — E785 Hyperlipidemia, unspecified: Secondary | ICD-10-CM | POA: Diagnosis not present

## 2022-07-14 DIAGNOSIS — I129 Hypertensive chronic kidney disease with stage 1 through stage 4 chronic kidney disease, or unspecified chronic kidney disease: Secondary | ICD-10-CM | POA: Diagnosis not present

## 2022-07-15 ENCOUNTER — Encounter: Payer: Self-pay | Admitting: *Deleted

## 2022-07-15 NOTE — Progress Notes (Signed)
PATIENT NAVIGATOR PROGRESS NOTE  Name: Gerald Valdez Date: 07/15/2022 MRN: [id number]  DOB: 29-Dec-1928   Reason for visit:  New patient appt  Comments:  Called and spoke with [name] and discussed New pt appt for [name]. Scheduled with Dr Truett Perna on 07/23/22.  Directions to building and parking reviewed. Given contact information to call with any questions or issues    Time spent counseling/coordinating care: 45-60 minutes

## 2022-07-19 ENCOUNTER — Inpatient Hospital Stay (HOSPITAL_BASED_OUTPATIENT_CLINIC_OR_DEPARTMENT_OTHER)
Admission: EM | Admit: 2022-07-19 | Discharge: 2022-07-27 | DRG: 189 | Disposition: A | Discharge status: Skilled Nursing Facility | Payer: Medicare Other | Attending: Internal Medicine | Admitting: Internal Medicine

## 2022-07-19 ENCOUNTER — Emergency Department (HOSPITAL_BASED_OUTPATIENT_CLINIC_OR_DEPARTMENT_OTHER): Payer: Medicare Other

## 2022-07-19 ENCOUNTER — Other Ambulatory Visit: Payer: Self-pay

## 2022-07-19 DIAGNOSIS — I3139 Other pericardial effusion (noninflammatory): Secondary | ICD-10-CM | POA: Diagnosis not present

## 2022-07-19 DIAGNOSIS — N179 Acute kidney failure, unspecified: Secondary | ICD-10-CM | POA: Diagnosis present

## 2022-07-19 DIAGNOSIS — Z741 Need for assistance with personal care: Secondary | ICD-10-CM | POA: Diagnosis not present

## 2022-07-19 DIAGNOSIS — R0902 Hypoxemia: Secondary | ICD-10-CM | POA: Diagnosis not present

## 2022-07-19 DIAGNOSIS — I5031 Acute diastolic (congestive) heart failure: Secondary | ICD-10-CM | POA: Diagnosis not present

## 2022-07-19 DIAGNOSIS — I959 Hypotension, unspecified: Secondary | ICD-10-CM | POA: Diagnosis not present

## 2022-07-19 DIAGNOSIS — E785 Hyperlipidemia, unspecified: Secondary | ICD-10-CM | POA: Diagnosis present

## 2022-07-19 DIAGNOSIS — E874 Mixed disorder of acid-base balance: Secondary | ICD-10-CM | POA: Diagnosis present

## 2022-07-19 DIAGNOSIS — Z961 Presence of intraocular lens: Secondary | ICD-10-CM | POA: Diagnosis present

## 2022-07-19 DIAGNOSIS — I5023 Acute on chronic systolic (congestive) heart failure: Secondary | ICD-10-CM | POA: Diagnosis not present

## 2022-07-19 DIAGNOSIS — E440 Moderate protein-calorie malnutrition: Secondary | ICD-10-CM | POA: Diagnosis not present

## 2022-07-19 DIAGNOSIS — I130 Hypertensive heart and chronic kidney disease with heart failure and stage 1 through stage 4 chronic kidney disease, or unspecified chronic kidney disease: Secondary | ICD-10-CM | POA: Diagnosis not present

## 2022-07-19 DIAGNOSIS — J189 Pneumonia, unspecified organism: Secondary | ICD-10-CM | POA: Diagnosis not present

## 2022-07-19 DIAGNOSIS — Z7189 Other specified counseling: Secondary | ICD-10-CM | POA: Diagnosis not present

## 2022-07-19 DIAGNOSIS — R531 Weakness: Secondary | ICD-10-CM | POA: Diagnosis not present

## 2022-07-19 DIAGNOSIS — I340 Nonrheumatic mitral (valve) insufficiency: Secondary | ICD-10-CM | POA: Diagnosis present

## 2022-07-19 DIAGNOSIS — J9621 Acute and chronic respiratory failure with hypoxia: Secondary | ICD-10-CM | POA: Diagnosis not present

## 2022-07-19 DIAGNOSIS — K219 Gastro-esophageal reflux disease without esophagitis: Secondary | ICD-10-CM | POA: Diagnosis present

## 2022-07-19 DIAGNOSIS — I4892 Unspecified atrial flutter: Secondary | ICD-10-CM | POA: Diagnosis present

## 2022-07-19 DIAGNOSIS — D539 Nutritional anemia, unspecified: Secondary | ICD-10-CM | POA: Diagnosis present

## 2022-07-19 DIAGNOSIS — Z6823 Body mass index (BMI) 23.0-23.9, adult: Secondary | ICD-10-CM

## 2022-07-19 DIAGNOSIS — I483 Typical atrial flutter: Secondary | ICD-10-CM | POA: Diagnosis not present

## 2022-07-19 DIAGNOSIS — E876 Hypokalemia: Secondary | ICD-10-CM | POA: Diagnosis present

## 2022-07-19 DIAGNOSIS — I2489 Other forms of acute ischemic heart disease: Secondary | ICD-10-CM | POA: Diagnosis present

## 2022-07-19 DIAGNOSIS — Z66 Do not resuscitate: Secondary | ICD-10-CM | POA: Diagnosis present

## 2022-07-19 DIAGNOSIS — J439 Emphysema, unspecified: Secondary | ICD-10-CM | POA: Diagnosis present

## 2022-07-19 DIAGNOSIS — J440 Chronic obstructive pulmonary disease with acute lower respiratory infection: Secondary | ICD-10-CM | POA: Diagnosis present

## 2022-07-19 DIAGNOSIS — N1831 Chronic kidney disease, stage 3a: Secondary | ICD-10-CM | POA: Diagnosis not present

## 2022-07-19 DIAGNOSIS — R0602 Shortness of breath: Principal | ICD-10-CM

## 2022-07-19 DIAGNOSIS — J9611 Chronic respiratory failure with hypoxia: Secondary | ICD-10-CM | POA: Diagnosis not present

## 2022-07-19 DIAGNOSIS — I440 Atrioventricular block, first degree: Secondary | ICD-10-CM | POA: Diagnosis present

## 2022-07-19 DIAGNOSIS — J9622 Acute and chronic respiratory failure with hypercapnia: Secondary | ICD-10-CM | POA: Diagnosis not present

## 2022-07-19 DIAGNOSIS — Z85828 Personal history of other malignant neoplasm of skin: Secondary | ICD-10-CM

## 2022-07-19 DIAGNOSIS — Z9842 Cataract extraction status, left eye: Secondary | ICD-10-CM

## 2022-07-19 DIAGNOSIS — C678 Malignant neoplasm of overlapping sites of bladder: Secondary | ICD-10-CM | POA: Diagnosis not present

## 2022-07-19 DIAGNOSIS — R4182 Altered mental status, unspecified: Secondary | ICD-10-CM | POA: Diagnosis not present

## 2022-07-19 DIAGNOSIS — G9341 Metabolic encephalopathy: Secondary | ICD-10-CM | POA: Diagnosis present

## 2022-07-19 DIAGNOSIS — I5021 Acute systolic (congestive) heart failure: Secondary | ICD-10-CM | POA: Diagnosis not present

## 2022-07-19 DIAGNOSIS — D649 Anemia, unspecified: Secondary | ICD-10-CM | POA: Diagnosis not present

## 2022-07-19 DIAGNOSIS — Z9841 Cataract extraction status, right eye: Secondary | ICD-10-CM

## 2022-07-19 DIAGNOSIS — R41841 Cognitive communication deficit: Secondary | ICD-10-CM | POA: Diagnosis not present

## 2022-07-19 DIAGNOSIS — I10 Essential (primary) hypertension: Secondary | ICD-10-CM | POA: Diagnosis present

## 2022-07-19 DIAGNOSIS — I2510 Atherosclerotic heart disease of native coronary artery without angina pectoris: Secondary | ICD-10-CM | POA: Diagnosis present

## 2022-07-19 DIAGNOSIS — I700 Atherosclerosis of aorta: Secondary | ICD-10-CM | POA: Diagnosis present

## 2022-07-19 DIAGNOSIS — Z515 Encounter for palliative care: Secondary | ICD-10-CM | POA: Diagnosis not present

## 2022-07-19 DIAGNOSIS — Z79899 Other long term (current) drug therapy: Secondary | ICD-10-CM

## 2022-07-19 DIAGNOSIS — Z1152 Encounter for screening for COVID-19: Secondary | ICD-10-CM | POA: Diagnosis not present

## 2022-07-19 DIAGNOSIS — Z823 Family history of stroke: Secondary | ICD-10-CM

## 2022-07-19 DIAGNOSIS — I509 Heart failure, unspecified: Secondary | ICD-10-CM

## 2022-07-19 DIAGNOSIS — F419 Anxiety disorder, unspecified: Secondary | ICD-10-CM | POA: Diagnosis present

## 2022-07-19 DIAGNOSIS — R823 Hemoglobinuria: Secondary | ICD-10-CM | POA: Diagnosis present

## 2022-07-19 DIAGNOSIS — R54 Age-related physical debility: Secondary | ICD-10-CM | POA: Diagnosis present

## 2022-07-19 DIAGNOSIS — Z8551 Personal history of malignant neoplasm of bladder: Secondary | ICD-10-CM

## 2022-07-19 DIAGNOSIS — Z8711 Personal history of peptic ulcer disease: Secondary | ICD-10-CM

## 2022-07-19 DIAGNOSIS — F0390 Unspecified dementia without behavioral disturbance: Secondary | ICD-10-CM | POA: Diagnosis present

## 2022-07-19 DIAGNOSIS — N1832 Chronic kidney disease, stage 3b: Secondary | ICD-10-CM | POA: Diagnosis present

## 2022-07-19 DIAGNOSIS — M6259 Muscle wasting and atrophy, not elsewhere classified, multiple sites: Secondary | ICD-10-CM | POA: Diagnosis not present

## 2022-07-19 DIAGNOSIS — J441 Chronic obstructive pulmonary disease with (acute) exacerbation: Secondary | ICD-10-CM | POA: Diagnosis present

## 2022-07-19 DIAGNOSIS — Z9981 Dependence on supplemental oxygen: Secondary | ICD-10-CM

## 2022-07-19 DIAGNOSIS — N390 Urinary tract infection, site not specified: Secondary | ICD-10-CM | POA: Diagnosis present

## 2022-07-19 DIAGNOSIS — J449 Chronic obstructive pulmonary disease, unspecified: Secondary | ICD-10-CM | POA: Diagnosis present

## 2022-07-19 DIAGNOSIS — R2681 Unsteadiness on feet: Secondary | ICD-10-CM | POA: Diagnosis not present

## 2022-07-19 DIAGNOSIS — M6281 Muscle weakness (generalized): Secondary | ICD-10-CM | POA: Diagnosis not present

## 2022-07-19 DIAGNOSIS — R1311 Dysphagia, oral phase: Secondary | ICD-10-CM | POA: Diagnosis not present

## 2022-07-19 DIAGNOSIS — E860 Dehydration: Secondary | ICD-10-CM | POA: Diagnosis present

## 2022-07-19 DIAGNOSIS — H9190 Unspecified hearing loss, unspecified ear: Secondary | ICD-10-CM | POA: Diagnosis present

## 2022-07-19 DIAGNOSIS — D630 Anemia in neoplastic disease: Secondary | ICD-10-CM | POA: Diagnosis present

## 2022-07-19 DIAGNOSIS — Z7401 Bed confinement status: Secondary | ICD-10-CM | POA: Diagnosis not present

## 2022-07-19 DIAGNOSIS — F17210 Nicotine dependence, cigarettes, uncomplicated: Secondary | ICD-10-CM | POA: Diagnosis present

## 2022-07-19 LAB — URINALYSIS, ROUTINE W REFLEX MICROSCOPIC
Bacteria, UA: NONE SEEN
Bilirubin Urine: NEGATIVE
Glucose, UA: NEGATIVE mg/dL
Ketones, ur: NEGATIVE mg/dL
Leukocytes,Ua: NEGATIVE
Nitrite: NEGATIVE
Protein, ur: 30 mg/dL — AB
Specific Gravity, Urine: 1.007 (ref 1.005–1.030)
pH: 6.5 (ref 5.0–8.0)

## 2022-07-19 LAB — LACTIC ACID, PLASMA: Lactic Acid, Venous: 0.8 mmol/L (ref 0.5–1.9)

## 2022-07-19 LAB — CBC WITH DIFFERENTIAL/PLATELET
Abs Immature Granulocytes: 0.11 10*3/uL — ABNORMAL HIGH (ref 0.00–0.07)
Basophils Absolute: 0 10*3/uL (ref 0.0–0.1)
Basophils Relative: 0 %
Eosinophils Absolute: 0.1 10*3/uL (ref 0.0–0.5)
Eosinophils Relative: 1 %
HCT: 34.6 % — ABNORMAL LOW (ref 39.0–52.0)
Hemoglobin: 10.2 g/dL — ABNORMAL LOW (ref 13.0–17.0)
Immature Granulocytes: 1 %
Lymphocytes Relative: 8 %
Lymphs Abs: 0.8 10*3/uL (ref 0.7–4.0)
MCH: 29.7 pg (ref 26.0–34.0)
MCHC: 29.5 g/dL — ABNORMAL LOW (ref 30.0–36.0)
MCV: 100.9 fL — ABNORMAL HIGH (ref 80.0–100.0)
Monocytes Absolute: 1.5 10*3/uL — ABNORMAL HIGH (ref 0.1–1.0)
Monocytes Relative: 15 %
Neutro Abs: 7.9 10*3/uL — ABNORMAL HIGH (ref 1.7–7.7)
Neutrophils Relative %: 75 %
Platelets: 195 10*3/uL (ref 150–400)
RBC: 3.43 MIL/uL — ABNORMAL LOW (ref 4.22–5.81)
RDW: 13.4 % (ref 11.5–15.5)
WBC: 10.4 10*3/uL (ref 4.0–10.5)
nRBC: 0 % (ref 0.0–0.2)

## 2022-07-19 LAB — I-STAT VENOUS BLOOD GAS, ED
Acid-Base Excess: 10 mmol/L — ABNORMAL HIGH (ref 0.0–2.0)
Acid-Base Excess: 10 mmol/L — ABNORMAL HIGH (ref 0.0–2.0)
Bicarbonate: 42 mmol/L — ABNORMAL HIGH (ref 20.0–28.0)
Bicarbonate: 40 mmol/L — ABNORMAL HIGH (ref 20.0–28.0)
Calcium, Ion: 1.24 mmol/L (ref 1.15–1.40)
Calcium, Ion: 1.22 mmol/L (ref 1.15–1.40)
HCT: 36 % — ABNORMAL LOW (ref 39.0–52.0)
HCT: 33 % — ABNORMAL LOW (ref 39.0–52.0)
Hemoglobin: 12.2 g/dL — ABNORMAL LOW (ref 13.0–17.0)
Hemoglobin: 11.2 g/dL — ABNORMAL LOW (ref 13.0–17.0)
O2 Saturation: 36 %
O2 Saturation: 33 %
Patient temperature: 97.7
Patient temperature: 97.7
Potassium: 4.3 mmol/L (ref 3.5–5.1)
Potassium: 4.3 mmol/L (ref 3.5–5.1)
Sodium: 132 mmol/L — ABNORMAL LOW (ref 135–145)
Sodium: 132 mmol/L — ABNORMAL LOW (ref 135–145)
TCO2: 45 mmol/L — ABNORMAL HIGH (ref 22–32)
TCO2: 43 mmol/L — ABNORMAL HIGH (ref 22–32)
pCO2, Ven: 101.5 mmHg (ref 44–60)
pCO2, Ven: 90.1 mmHg (ref 44–60)
pH, Ven: 7.223 — ABNORMAL LOW (ref 7.25–7.43)
pH, Ven: 7.252 (ref 7.25–7.43)
pO2, Ven: 27 mmHg — CL (ref 32–45)
pO2, Ven: 24 mmHg — CL (ref 32–45)

## 2022-07-19 LAB — BASIC METABOLIC PANEL
Anion gap: 7 (ref 5–15)
BUN: 34 mg/dL — ABNORMAL HIGH (ref 8–23)
CO2: 38 mmol/L — ABNORMAL HIGH (ref 22–32)
Calcium: 9.2 mg/dL (ref 8.9–10.3)
Chloride: 91 mmol/L — ABNORMAL LOW (ref 98–111)
Creatinine, Ser: 1.97 mg/dL — ABNORMAL HIGH (ref 0.61–1.24)
GFR, Estimated: 31 mL/min — ABNORMAL LOW (ref >60)
Glucose, Bld: 118 mg/dL — ABNORMAL HIGH (ref 70–99)
Potassium: 4.3 mmol/L (ref 3.5–5.1)
Sodium: 136 mmol/L (ref 135–145)

## 2022-07-19 LAB — TSH: TSH: 2.145 u[IU]/mL (ref 0.350–4.500)

## 2022-07-19 LAB — HEPATIC FUNCTION PANEL
ALT: 72 U/L — ABNORMAL HIGH (ref 0–44)
AST: 37 U/L (ref 15–41)
Albumin: 4.5 g/dL (ref 3.5–5.0)
Alkaline Phosphatase: 36 U/L — ABNORMAL LOW (ref 38–126)
Bilirubin, Direct: 0.1 mg/dL (ref 0.0–0.2)
Indirect Bilirubin: 0.3 mg/dL (ref 0.3–0.9)
Total Bilirubin: 0.4 mg/dL (ref 0.3–1.2)
Total Protein: 6.7 g/dL (ref 6.5–8.1)

## 2022-07-19 LAB — BRAIN NATRIURETIC PEPTIDE: B Natriuretic Peptide: 644.6 pg/mL — ABNORMAL HIGH (ref 0.0–100.0)

## 2022-07-19 LAB — AMMONIA: Ammonia: 28 umol/L (ref 9–35)

## 2022-07-19 LAB — TROPONIN I (HIGH SENSITIVITY): Troponin I (High Sensitivity): 42 ng/L — ABNORMAL HIGH (ref <18)

## 2022-07-19 LAB — SARS CORONAVIRUS 2 BY RT PCR: SARS Coronavirus 2 by RT PCR: NEGATIVE

## 2022-07-19 MED ORDER — SODIUM CHLORIDE 0.9 % IV SOLN
1.0000 g | Freq: Once | INTRAVENOUS | Status: AC
  Administered 2022-07-19: 1 g via INTRAVENOUS
  Filled 2022-07-19: qty 10

## 2022-07-19 MED ORDER — FUROSEMIDE 10 MG/ML IJ SOLN
40.0000 mg | Freq: Once | INTRAMUSCULAR | Status: AC
  Administered 2022-07-19: 40 mg via INTRAVENOUS
  Filled 2022-07-19: qty 4

## 2022-07-19 MED ORDER — ALBUTEROL SULFATE (2.5 MG/3ML) 0.083% IN NEBU
2.5000 mg | INHALATION_SOLUTION | Freq: Once | RESPIRATORY_TRACT | Status: AC
  Administered 2022-07-19: 2.5 mg via RESPIRATORY_TRACT
  Filled 2022-07-19: qty 3

## 2022-07-19 MED ORDER — METHYLPREDNISOLONE SODIUM SUCC 125 MG IJ SOLR
125.0000 mg | Freq: Once | INTRAMUSCULAR | Status: AC
  Administered 2022-07-19: 125 mg via INTRAVENOUS
  Filled 2022-07-19: qty 2

## 2022-07-19 MED ORDER — MAGNESIUM SULFATE 2 GM/50ML IV SOLN
2.0000 g | Freq: Once | INTRAVENOUS | Status: AC
  Administered 2022-07-19: 2 g via INTRAVENOUS
  Filled 2022-07-19: qty 50

## 2022-07-19 MED ORDER — SODIUM CHLORIDE 0.9 % IV SOLN
500.0000 mg | Freq: Once | INTRAVENOUS | Status: AC
  Administered 2022-07-19: 500 mg via INTRAVENOUS
  Filled 2022-07-19: qty 5

## 2022-07-19 NOTE — ED Provider Notes (Signed)
[facility] AT [facility] Provider Note   CSN: [id number] Arrival date & time: 07/19/22  1549     History Chief Complaint  Patient presents with   Fatigue    HPI Gerald Valdez is a 89 y.o. male presenting for chief of altered mental status.  Gerald Valdez has an extensive medical history including hypertension, hyperLipidemia, COPD, recurrent GI bleeds, bladder cancer, CKD, UTI with sepsis in the past, chronic hypoxic respiratory failure on 2 L nasal cannula..  Wife brings him in today with a chief complaint of altered mental status.  She states that he has been profoundly confused today.  He has been sleeping for the better part of the last week fairly regularly.  He is on Keflex for UTI but has not gotten antibiotics since Friday.  Patient's recorded medical, surgical, social, medication list and allergies were reviewed in the Snapshot window as part of the initial history.   Review of Systems   Review of Systems  Constitutional:  Negative for chills and fever.  HENT:  Negative for ear pain and sore throat.   Eyes:  Negative for pain and visual disturbance.  Respiratory:  Negative for cough and shortness of breath.   Cardiovascular:  Negative for chest pain and palpitations.  Gastrointestinal:  Negative for abdominal pain and vomiting.  Genitourinary:  Negative for dysuria and hematuria.  Musculoskeletal:  Negative for arthralgias and back pain.  Skin:  Negative for color change and rash.  Neurological:  Negative for seizures and syncope.  Psychiatric/Behavioral:  Positive for confusion.   All other systems reviewed and are negative.   Physical Exam Updated Vital Signs BP (!) 101/56   Pulse 88   Temp 97.7 F (36.5 C) (Oral)   Resp 19   Ht 5\' 6"  (1.676 m)   Wt 60.2 kg   SpO2 95%   BMI 21.42 kg/m  Physical Exam Vitals and nursing note reviewed.  Constitutional:      General: He is not in acute distress.    Appearance: He is  well-developed.  HENT:     Head: Normocephalic and atraumatic.  Eyes:     Conjunctiva/sclera: Conjunctivae normal.  Cardiovascular:     Rate and Rhythm: Normal rate and regular rhythm.     Heart sounds: No murmur heard. Pulmonary:     Effort: Pulmonary effort is normal. No respiratory distress.     Breath sounds: Normal breath sounds. Decreased air movement present.  Abdominal:     Palpations: Abdomen is soft.     Tenderness: There is no abdominal tenderness.  Musculoskeletal:        General: No swelling.     Cervical back: Neck supple.  Skin:    General: Skin is warm and dry.     Capillary Refill: Capillary refill takes less than 2 seconds.  Neurological:     Mental Status: He is alert. He is disoriented.  Psychiatric:        Mood and Affect: Mood normal.      ED Course/ Medical Decision Making/ A&P    Procedures .Critical Care  Performed by: Glyn Ade, MD Authorized by: Glyn Ade, MD   Critical care provider statement:    Critical care time (minutes):  95   Critical care was necessary to treat or prevent imminent or life-threatening deterioration of the following conditions:  Respiratory failure and sepsis   Critical care was time spent personally by me on the following activities:  Development of treatment plan with  patient or surrogate, discussions with consultants, evaluation of patient's response to treatment, examination of patient, ordering and review of laboratory studies, ordering and review of radiographic studies, ordering and performing treatments and interventions, pulse oximetry, re-evaluation of patient's condition and review of old charts   Care discussed with: admitting provider      Medications Ordered in ED Medications  albuterol (PROVENTIL) (2.5 MG/3ML) 0.083% nebulizer solution 2.5 mg (2.5 mg Nebulization Given 07/19/22 1834)  magnesium sulfate IVPB 2 g 50 mL (0 g Intravenous Stopped 07/19/22 1913)  methylPREDNISolone sodium succinate  (SOLU-MEDROL) 125 mg/2 mL injection 125 mg (125 mg Intravenous Given 07/19/22 1817)  cefTRIAXone (ROCEPHIN) 1 g in sodium chloride 0.9 % 100 mL IVPB (0 g Intravenous Stopped 07/19/22 2036)  azithromycin (ZITHROMAX) 500 mg in sodium chloride 0.9 % 250 mL IVPB (0 mg Intravenous Stopped 07/19/22 2036)  furosemide (LASIX) injection 40 mg (40 mg Intravenous Given 07/19/22 1914)   Medical Decision Making:   Gerald Valdez is a 89 y.o. male who presented to the ED today with altered mental status detailed above.    Handoff received from EMS.  Additional history discussed with patient's family/caregivers.  Patient placed on continuous vitals and telemetry monitoring while in ED which was reviewed periodically.  Complete initial physical exam performed, notably the patient  was hemodynamically stable no acute distress.  On his home oxygen satting 98%.  Very diminished breath sounds bilaterally.    Reviewed and confirmed nursing documentation for past medical history, family history, social history.    Initial Assessment:   With the patient's presentation of altered mental status, most likely diagnosis is delerium 2/2 infectious etiology (UTI/CAP/URI) vs metabolic abnormality (Na/K/Mg/Ca) vs nonspecific etiology. Other diagnoses were considered including (but not limited to) CVA, ICH, intracranial mass, critical dehydration, heptatic dysfunction, uremia, hypercarbia, intoxication, endrocrine abnormality, toxidrome. These are considered less likely due to history of present illness and physical exam findings.   This is most consistent with an acute life/limb threatening illness complicated by underlying chronic conditions.  Initial Plan:  Screening labs including CBC and Metabolic panel to evaluate for infectious or metabolic etiology of disease.  Urinalysis with reflex culture ordered to evaluate for UTI or relevant urologic/nephrologic pathology.  CXR to evaluate for structural/infectious intrathoracic  pathology.  TSH for evaluation for endrocrine etiology VBG for acid/base status and further toxidrome evaulation Troponin/BNP/EKG to evaluate for cardiac pathology Objective evaluation as below reviewed   Initial Study Results:   Laboratory  Troponin elevation of 42, BNP elevation at 640, initial blood gas with critical CO2 retention at 100 with resulting pH decrease.  Serial blood gas shows improvement on BiPAP  EKG EKG was reviewed independently. Rate, rhythm, axis, intervals all examined and without medically relevant abnormality. ST segments without concerns for elevations.    Radiology:  All images reviewed independently. Agree with radiology report at this time.   DG Chest Portable 1 View  Result Date: 07/19/2022 CLINICAL DATA:  Shortness of breath, altered mental status. EXAM: PORTABLE CHEST 1 VIEW COMPARISON:  Chest radiograph 06/27/2021 FINDINGS: The cardiomediastinal silhouette is normal. Coarsened interstitial markings in both lungs are unchanged, chronic. There is hazy opacity in the right lower lobe and a probable small right pleural effusion. There is no other focal airspace opacity. There is no significant left effusion. There is no pneumothorax There is no acute osseous abnormality. IMPRESSION: Hazy right lower lobe opacity may reflect right lower lobe pneumonia and small parapneumonic effusion in the correct clinical setting. Electronically Signed  By: Lesia Hausen M.D.   On: 07/19/2022 17:26     Final Assessment and Plan:   Patient started on community-acquired pneumonia treatment, titrated on BiPAP while in the emergency room.  Gross clinical improvement while in the emergency room.  Reassessed frequently pending admission to stepdown bed. Not administering 30 cc/kg IV fluid because of history of heart failure and evidence of volume overload on exam.  Clinical Impression:  1. SOB (shortness of breath)      Admit   Final Clinical Impression(s) / ED Diagnoses Final  diagnoses:  SOB (shortness of breath)    Rx / DC Orders ED Discharge Orders     None         Glyn Ade, MD 07/19/22 2257

## 2022-07-19 NOTE — Progress Notes (Signed)
Plan of Care Note for accepted transfer   Patient: Gerald Valdez MRN: [id number]   DOA: 07/19/2022  Facility requesting transfer: [facility]  Requesting Provider: Dr. Doran Durand  Reason for transfer: Acute COPD exacerbation, right lower lobe pneumonia, acute CHF, acute on chronic hypoxemic hypercapnic respiratory failure, acute metabolic encephalopathy, AKI  Facility course: 89 year old male with a past medical history of COPD, chronic hypoxic respiratory failure on 3 L home oxygen, CAD, CKD stage IIIa, hypertension, GERD, history of GI bleed, hyperlipidemia, recurrent high-grade nonmuscle invasive bladder cancer status post TURBT, chronic anemia presented to the ED for evaluation of confusion and somnolence.  Currently being treated for UTI but has not received antibiotics for the past few days.  Transported to the ED on 6 L nasal cannula by EMS.  Vital signs on arrival to the ED: Temperature 97.7 F, pulse 94, respiratory rate 22, blood pressure 130/69, and SpO2 98% on 6 L [place].  Labs showing WBC 10.4, hemoglobin 10.2, creatinine 1.9 (baseline 1.2), ALT 72 with no elevation of remainder of LFTs, lactate normal x 1, blood cultures drawn, UA pending, BNP 644, troponin 42 with no acute ischemic changes on EKG and no chest pain, SARS-CoV-2 PCR negative, TSH normal, ammonia normal, VBG with pH 7.22 and pCO2 101.5.  Patient placed on BiPAP with improvement.  Chest x-ray showing hazy right lower lobe opacity which may reflect right lower lobe pneumonia and small parapneumonic effusion. Patient was given albuterol neb, IV Lasix 40 mg, Solu-Medrol 125 mg, ceftriaxone, azithromycin, and IV mag 2 g.  Plan of care: The patient is accepted for admission to Progressive unit, at [facility].  [facility] will assume care on arrival to accepting facility. Until arrival, care as per EDP. However, [facility] available 24/7 for questions and assistance.  Author: John Giovanni, MD 07/19/2022  Check [email or web address]  for on-call coverage.  Nursing staff, Please call [facility] Admits & Consults System-Wide number on Amion as soon as patient's arrival, so appropriate admitting provider can evaluate the pt.

## 2022-07-19 NOTE — ED Triage Notes (Addendum)
Patient here POV from Home.  Endorses Feeling unwell for the past few days. Told 5 Days ago he had a UTI when he went to PCP.  Given Levofloxacin for Same but discontinued Medication  No New Cough. Some SOB. Some New Lower Extremity Swelling. No Known Fevers. No Pain. Bladder Cancer Surgery Mid April. Uses 3LNC at all times due to COPD. No N/V/D.   NAD Noted during Triage. A&Ox4. GCS 15. BIB Wheelchair.

## 2022-07-20 ENCOUNTER — Encounter (HOSPITAL_BASED_OUTPATIENT_CLINIC_OR_DEPARTMENT_OTHER): Payer: Self-pay | Admitting: Internal Medicine

## 2022-07-20 DIAGNOSIS — D649 Anemia, unspecified: Secondary | ICD-10-CM

## 2022-07-20 DIAGNOSIS — J440 Chronic obstructive pulmonary disease with acute lower respiratory infection: Secondary | ICD-10-CM

## 2022-07-20 DIAGNOSIS — E785 Hyperlipidemia, unspecified: Secondary | ICD-10-CM

## 2022-07-20 DIAGNOSIS — E440 Moderate protein-calorie malnutrition: Secondary | ICD-10-CM | POA: Diagnosis present

## 2022-07-20 DIAGNOSIS — Z7401 Bed confinement status: Secondary | ICD-10-CM | POA: Diagnosis not present

## 2022-07-20 DIAGNOSIS — D539 Nutritional anemia, unspecified: Secondary | ICD-10-CM | POA: Diagnosis present

## 2022-07-20 DIAGNOSIS — Z515 Encounter for palliative care: Secondary | ICD-10-CM | POA: Diagnosis not present

## 2022-07-20 DIAGNOSIS — R0902 Hypoxemia: Secondary | ICD-10-CM | POA: Diagnosis not present

## 2022-07-20 DIAGNOSIS — J9611 Chronic respiratory failure with hypoxia: Secondary | ICD-10-CM | POA: Diagnosis not present

## 2022-07-20 DIAGNOSIS — J9621 Acute and chronic respiratory failure with hypoxia: Secondary | ICD-10-CM | POA: Diagnosis present

## 2022-07-20 DIAGNOSIS — I5021 Acute systolic (congestive) heart failure: Secondary | ICD-10-CM | POA: Diagnosis not present

## 2022-07-20 DIAGNOSIS — N1832 Chronic kidney disease, stage 3b: Secondary | ICD-10-CM | POA: Diagnosis present

## 2022-07-20 DIAGNOSIS — I700 Atherosclerosis of aorta: Secondary | ICD-10-CM | POA: Diagnosis present

## 2022-07-20 DIAGNOSIS — J9612 Chronic respiratory failure with hypercapnia: Secondary | ICD-10-CM | POA: Insufficient documentation

## 2022-07-20 DIAGNOSIS — Z7189 Other specified counseling: Secondary | ICD-10-CM | POA: Diagnosis not present

## 2022-07-20 DIAGNOSIS — I10 Essential (primary) hypertension: Secondary | ICD-10-CM

## 2022-07-20 DIAGNOSIS — G9341 Metabolic encephalopathy: Secondary | ICD-10-CM | POA: Diagnosis present

## 2022-07-20 DIAGNOSIS — F0390 Unspecified dementia without behavioral disturbance: Secondary | ICD-10-CM | POA: Diagnosis present

## 2022-07-20 DIAGNOSIS — Z741 Need for assistance with personal care: Secondary | ICD-10-CM | POA: Diagnosis not present

## 2022-07-20 DIAGNOSIS — N390 Urinary tract infection, site not specified: Secondary | ICD-10-CM | POA: Diagnosis present

## 2022-07-20 DIAGNOSIS — M6281 Muscle weakness (generalized): Secondary | ICD-10-CM | POA: Diagnosis not present

## 2022-07-20 DIAGNOSIS — I340 Nonrheumatic mitral (valve) insufficiency: Secondary | ICD-10-CM | POA: Diagnosis present

## 2022-07-20 DIAGNOSIS — I483 Typical atrial flutter: Secondary | ICD-10-CM | POA: Diagnosis not present

## 2022-07-20 DIAGNOSIS — J189 Pneumonia, unspecified organism: Secondary | ICD-10-CM

## 2022-07-20 DIAGNOSIS — I509 Heart failure, unspecified: Secondary | ICD-10-CM | POA: Diagnosis not present

## 2022-07-20 DIAGNOSIS — Z66 Do not resuscitate: Secondary | ICD-10-CM | POA: Diagnosis present

## 2022-07-20 DIAGNOSIS — R2681 Unsteadiness on feet: Secondary | ICD-10-CM | POA: Diagnosis not present

## 2022-07-20 DIAGNOSIS — C678 Malignant neoplasm of overlapping sites of bladder: Secondary | ICD-10-CM | POA: Diagnosis not present

## 2022-07-20 DIAGNOSIS — I5023 Acute on chronic systolic (congestive) heart failure: Secondary | ICD-10-CM | POA: Diagnosis present

## 2022-07-20 DIAGNOSIS — N179 Acute kidney failure, unspecified: Secondary | ICD-10-CM | POA: Diagnosis present

## 2022-07-20 DIAGNOSIS — J441 Chronic obstructive pulmonary disease with (acute) exacerbation: Secondary | ICD-10-CM | POA: Diagnosis present

## 2022-07-20 DIAGNOSIS — R41841 Cognitive communication deficit: Secondary | ICD-10-CM | POA: Diagnosis not present

## 2022-07-20 DIAGNOSIS — J439 Emphysema, unspecified: Secondary | ICD-10-CM | POA: Diagnosis present

## 2022-07-20 DIAGNOSIS — E874 Mixed disorder of acid-base balance: Secondary | ICD-10-CM | POA: Diagnosis present

## 2022-07-20 DIAGNOSIS — I3139 Other pericardial effusion (noninflammatory): Secondary | ICD-10-CM | POA: Diagnosis present

## 2022-07-20 DIAGNOSIS — R1311 Dysphagia, oral phase: Secondary | ICD-10-CM | POA: Diagnosis not present

## 2022-07-20 DIAGNOSIS — Z1152 Encounter for screening for COVID-19: Secondary | ICD-10-CM | POA: Diagnosis not present

## 2022-07-20 DIAGNOSIS — I130 Hypertensive heart and chronic kidney disease with heart failure and stage 1 through stage 4 chronic kidney disease, or unspecified chronic kidney disease: Secondary | ICD-10-CM | POA: Diagnosis present

## 2022-07-20 DIAGNOSIS — N1831 Chronic kidney disease, stage 3a: Secondary | ICD-10-CM | POA: Diagnosis not present

## 2022-07-20 DIAGNOSIS — J449 Chronic obstructive pulmonary disease, unspecified: Secondary | ICD-10-CM | POA: Diagnosis not present

## 2022-07-20 DIAGNOSIS — R0602 Shortness of breath: Secondary | ICD-10-CM | POA: Diagnosis present

## 2022-07-20 DIAGNOSIS — J9622 Acute and chronic respiratory failure with hypercapnia: Secondary | ICD-10-CM | POA: Diagnosis present

## 2022-07-20 DIAGNOSIS — I5031 Acute diastolic (congestive) heart failure: Secondary | ICD-10-CM | POA: Diagnosis not present

## 2022-07-20 DIAGNOSIS — R531 Weakness: Secondary | ICD-10-CM | POA: Diagnosis not present

## 2022-07-20 DIAGNOSIS — M6259 Muscle wasting and atrophy, not elsewhere classified, multiple sites: Secondary | ICD-10-CM | POA: Diagnosis not present

## 2022-07-20 DIAGNOSIS — I4892 Unspecified atrial flutter: Secondary | ICD-10-CM | POA: Diagnosis present

## 2022-07-20 DIAGNOSIS — I2489 Other forms of acute ischemic heart disease: Secondary | ICD-10-CM | POA: Diagnosis present

## 2022-07-20 LAB — I-STAT VENOUS BLOOD GAS, ED
Acid-Base Excess: 14 mmol/L — ABNORMAL HIGH (ref 0.0–2.0)
Bicarbonate: 43.6 mmol/L — ABNORMAL HIGH (ref 20.0–28.0)
Calcium, Ion: 1.16 mmol/L (ref 1.15–1.40)
HCT: 35 % — ABNORMAL LOW (ref 39.0–52.0)
Hemoglobin: 11.9 g/dL — ABNORMAL LOW (ref 13.0–17.0)
O2 Saturation: 36 %
Potassium: 4 mmol/L (ref 3.5–5.1)
Sodium: 134 mmol/L — ABNORMAL LOW (ref 135–145)
TCO2: 46 mmol/L — ABNORMAL HIGH (ref 22–32)
pCO2, Ven: 86.5 mmHg (ref 44–60)
pH, Ven: 7.311 (ref 7.25–7.43)
pO2, Ven: 25 mmHg — CL (ref 32–45)

## 2022-07-20 LAB — BLOOD GAS, ARTERIAL
Acid-Base Excess: 15.7 mmol/L — ABNORMAL HIGH (ref 0.0–2.0)
Bicarbonate: 42.5 mmol/L — ABNORMAL HIGH (ref 20.0–28.0)
O2 Saturation: 99.4 %
Patient temperature: 37
pCO2 arterial: 64 mmHg — ABNORMAL HIGH (ref 32–48)
pH, Arterial: 7.43 (ref 7.35–7.45)
pO2, Arterial: 91 mmHg (ref 83–108)

## 2022-07-20 LAB — CBC
HCT: 34.5 % — ABNORMAL LOW (ref 39.0–52.0)
Hemoglobin: 10.6 g/dL — ABNORMAL LOW (ref 13.0–17.0)
MCH: 30.2 pg (ref 26.0–34.0)
MCHC: 30.7 g/dL (ref 30.0–36.0)
MCV: 98.3 fL (ref 80.0–100.0)
Platelets: 205 10*3/uL (ref 150–400)
RBC: 3.51 MIL/uL — ABNORMAL LOW (ref 4.22–5.81)
RDW: 13.2 % (ref 11.5–15.5)
WBC: 12.8 10*3/uL — ABNORMAL HIGH (ref 4.0–10.5)
nRBC: 0 % (ref 0.0–0.2)

## 2022-07-20 LAB — CREATININE, SERUM
Creatinine, Ser: 1.92 mg/dL — ABNORMAL HIGH (ref 0.61–1.24)
GFR, Estimated: 32 mL/min — ABNORMAL LOW (ref >60)

## 2022-07-20 LAB — CULTURE, BLOOD (ROUTINE X 2): Special Requests: ADEQUATE

## 2022-07-20 LAB — STREP PNEUMONIAE URINARY ANTIGEN: Strep Pneumo Urinary Antigen: NEGATIVE

## 2022-07-20 MED ORDER — SODIUM CHLORIDE 0.9 % IV SOLN
500.0000 mg | INTRAVENOUS | Status: AC
  Administered 2022-07-20 – 2022-07-23 (×4): 500 mg via INTRAVENOUS
  Filled 2022-07-20 (×4): qty 5

## 2022-07-20 MED ORDER — IPRATROPIUM-ALBUTEROL 0.5-2.5 (3) MG/3ML IN SOLN: 3.0000 mL | RESPIRATORY_TRACT | Status: DC | PRN

## 2022-07-20 MED ORDER — ESCITALOPRAM OXALATE 10 MG PO TABS
10.0000 mg | ORAL_TABLET | Freq: Every day | ORAL | Status: DC
  Administered 2022-07-20 – 2022-07-27 (×8): 10 mg via ORAL
  Filled 2022-07-20 (×8): qty 1

## 2022-07-20 MED ORDER — ENOXAPARIN SODIUM 30 MG/0.3ML IJ SOSY
30.0000 mg | PREFILLED_SYRINGE | INTRAMUSCULAR | Status: DC
  Administered 2022-07-20: 30 mg via SUBCUTANEOUS
  Filled 2022-07-20: qty 0.3

## 2022-07-20 MED ORDER — ENOXAPARIN SODIUM 40 MG/0.4ML IJ SOSY
40.0000 mg | PREFILLED_SYRINGE | INTRAMUSCULAR | Status: DC
  Filled 2022-07-20: qty 0.4

## 2022-07-20 MED ORDER — SODIUM CHLORIDE 0.9% FLUSH
3.0000 mL | Freq: Two times a day (BID) | INTRAVENOUS | Status: DC
  Administered 2022-07-20 – 2022-07-27 (×13): 3 mL via INTRAVENOUS

## 2022-07-20 MED ORDER — UMECLIDINIUM BROMIDE 62.5 MCG/ACT IN AEPB
1.0000 | INHALATION_SPRAY | Freq: Every day | RESPIRATORY_TRACT | Status: DC
  Administered 2022-07-21 – 2022-07-26 (×6): 1 via RESPIRATORY_TRACT
  Filled 2022-07-20: qty 7

## 2022-07-20 MED ORDER — SODIUM CHLORIDE 0.9 % IV SOLN
2.0000 g | INTRAVENOUS | Status: AC
  Administered 2022-07-20 – 2022-07-24 (×5): 2 g via INTRAVENOUS
  Filled 2022-07-20 (×5): qty 20

## 2022-07-20 MED ORDER — ROSUVASTATIN CALCIUM 20 MG PO TABS
20.0000 mg | ORAL_TABLET | Freq: Every day | ORAL | Status: DC
  Administered 2022-07-20 – 2022-07-22 (×3): 20 mg via ORAL
  Filled 2022-07-20 (×3): qty 1

## 2022-07-20 NOTE — Progress Notes (Signed)
Arrived to [facility] 6E06 from [facility] transported by [employer]. Placed on cardiac monitor and placed on Bipap with Respiratory Therapist Ayoob.      07/20/22 1151  Vitals  BP 122/80  MAP (mmHg) 91  BP Location Right Arm  BP Method Automatic  Patient Position (if appropriate) Lying  Pulse Rate 92  Pulse Rate Source Monitor  ECG Heart Rate 92  Resp 20  MEWS COLOR  MEWS Score Color Green  Oxygen Therapy  SpO2 100 %  O2 Device Bi-PAP  FiO2 (%) 30 %  MEWS Score  MEWS Temp 0  MEWS Systolic 0  MEWS Pulse 0  MEWS RR 0  MEWS LOC 0  MEWS Score 0

## 2022-07-20 NOTE — Progress Notes (Signed)
ABG attempted 2X unsuccessfully. I asked another RT to get ABG.

## 2022-07-20 NOTE — ED Notes (Signed)
Thomas with cl called for transport  

## 2022-07-20 NOTE — ED Notes (Signed)
BIPAP completed with no changes made at this time. Patient is asleep and appears to be tolerating the BIPAP well at this time with no distress

## 2022-07-20 NOTE — H&P (Signed)
History and Physical    Gerald Valdez ZOX:[id number] DOB: April 07, 1928 DOA: 07/19/2022  PCP: [name], MD  Patient coming from: Home Chief Complaint: somnolence   HPI: Gerald Valdez is a 89 y.o. male with medical history significant of h/o bladder cancer, chronic respiratory failure with hypoxia, COPD, HTN, GERD, HLD, h/o anemia, CKD 3a, malnutrition who presents from home for somnolence and confusion.  Patient is wearing bipap.  History added to by wife and daughter.  Per family, patient developed urinary symptoms, pain in the abdomen on Wednesday of last week. They presented to the primary care doctor and were started on antibiotics.  A few days later he developed confusion and sleepiness which led to his wife stopping his antibiotics out of concern for a drug effect.  On Friday, he was more sleepy and then by Sunday she could not wake him up.  Gerald Valdez does not remember much of this, but he is more alert since being on bipap and notes that he was very sleepy and has abdominal pain.  He has also not consistently been taking his medications.  He is not on any inhalers for his emphysema and is not on diuretics.    ED Course: In the ED, he was found to have a CO2 which was very high (>100) and was placed on bipap.  Imaging showed a possible pneumonia.  Cr was 1.97, which is stable, but above a baseline of around 1.3.  BNP was 644.  TnI was 42 and needs to be recollected.  WBC 10.4 and he has a macrocytic anemia, H/H 10 and 34.  TSH was 2.145. COVID negative.  EKG initially showed Aflutter and PVCs.    Review of Systems: As per HPI otherwise all other systems reviewed and are negative.   Past Medical History:  Diagnosis Date   Acute duodenal ulcer with bleeding 01/30/2014   Acute post-hemorrhagic anemia 01/30/2014   Anxiety    Aortic atherosclerosis (HCC) 02/10/2020   Arthritis    Basal cell carcinoma    Cancer (HCC)    bladder   Chronic kidney disease    COPD exacerbation (HCC)  02/10/2020   Coronary artery disease    Dyspnea    Dyspnea on exertion 02/10/2020   Emphysema of lung (HCC)    Essential hypertension 02/10/2020   GERD (gastroesophageal reflux disease) 02/10/2020   GI bleed 01/30/2014   Hyperlipidemia 02/10/2020    Past Surgical History:  Procedure Laterality Date   BLADDER SURGERY     CATARACT EXTRACTION W/ INTRAOCULAR LENS IMPLANT     CYSTOSCOPY W/ RETROGRADES Bilateral 11/20/2020   Procedure: CYSTOSCOPY WITH RETROGRADE PYELOGRAM;  Surgeon: Marcine Matar, MD;  Location: [facility];  Service: Urology;  Laterality: Bilateral;   CYSTOSCOPY W/ RETROGRADES Right 02/12/2021   Procedure: CYSTOSCOPY WITH RETROGRADE PYELOGRAM;  Surgeon: Marcine Matar, MD;  Location: [facility];  Service: Urology;  Laterality: Right;   CYSTOSCOPY W/ RETROGRADES Bilateral 06/25/2021   Procedure: CYSTOSCOPY WITH RETROGRADE PYELOGRAM;  Surgeon: Marcine Matar, MD;  Location: [facility];  Service: Urology;  Laterality: Bilateral;   ESOPHAGOGASTRODUODENOSCOPY N/A 01/30/2014   Procedure: ESOPHAGOGASTRODUODENOSCOPY (EGD);  Surgeon: Louis Meckel, MD;  Location: [facility];  Service: Endoscopy;  Laterality: N/A;   TONSILLECTOMY     TRANSURETHRAL RESECTION OF BLADDER TUMOR N/A 02/12/2021   Procedure: REPEAT TRANSURETHRAL RESECTION OF BLADDER TUMOR (TURBT);  Surgeon: Marcine Matar, MD;  Location: [facility];  Service: Urology;  Laterality: N/A;   TRANSURETHRAL RESECTION OF BLADDER TUMOR WITH MITOMYCIN-C  N/A 11/20/2020   Procedure: TRANSURETHRAL RESECTION OF BLADDER TUMOR;  Surgeon: Marcine Matar, MD;  Location: [facility];  Service: Urology;  Laterality: N/A;   TRANSURETHRAL RESECTION OF BLADDER TUMOR WITH MITOMYCIN-C N/A 06/25/2021   Procedure: TRANSURETHRAL RESECTION OF BLADDER TUMOR WITH GEMCITABINE;  Surgeon: Marcine Matar, MD;  Location: [facility];  Service: Urology;  Laterality: N/A;  1 HR   TRANSURETHRAL RESECTION OF BLADDER TUMOR WITH MITOMYCIN-C N/A 12/10/2021    Procedure: TRANSURETHRAL RESECTION OF BLADDER TUMOR WITH GEMCITABINE;  Surgeon: Marcine Matar, MD;  Location: [facility];  Service: Urology;  Laterality: N/A;  1 HR   TRANSURETHRAL RESECTION OF BLADDER TUMOR WITH MITOMYCIN-C N/A 05/27/2022   Procedure: TRANSURETHRAL RESECTION OF BLADDER TUMOR;  Surgeon: Marcine Matar, MD;  Location: [facility];  Service: Urology;  Laterality: N/A;    Social History  reports that he quit smoking about 30 years ago. His smoking use included cigarettes. He started smoking about 77 years ago. He has never used smokeless tobacco. He reports that he does not currently use alcohol. He reports that he does not use drugs.  No Known Allergies  Family History  Problem Relation Age of Onset   Stroke Father      Prior to Admission medications   Medication Sig Start Date End Date Taking? Authorizing Provider  amLODipine (NORVASC) 5 MG tablet Take 5 mg by mouth daily with breakfast.    [provider]  ascorbic acid (VITAMIN C) 500 MG tablet Take 500 mg by mouth daily.    [provider]  cephALEXin (KEFLEX) 500 MG capsule Take 500 mg by mouth 2 (two) times daily.    [provider]  escitalopram (LEXAPRO) 10 MG tablet Take 10 mg by mouth daily.    [provider]  ferrous sulfate 325 (65 FE) MG tablet Take 325 mg by mouth daily with breakfast.    [provider]  loratadine (CLARITIN) 10 MG tablet Take 10 mg by mouth daily.    [provider]  Multiple Vitamin (MULTIVITAMIN WITH MINERALS) TABS tablet Take 1 tablet by mouth daily with breakfast.    [provider]  OXYGEN Inhale 3 L into the lungs continuous.    [provider]  rosuvastatin (CRESTOR) 20 MG tablet Take 20 mg by mouth at bedtime. 08/15/20   [provider]  saccharomyces boulardii (FLORASTOR) 250 MG capsule Take 1 capsule (250 mg total) by mouth 2 (two) times daily. Patient taking differently: Take 250 mg by mouth daily.  07/05/21   Leatha Gilding, MD    Physical Exam: Vitals:   07/20/22 1201 07/20/22 1300 07/20/22 1400 07/20/22 1500  BP:      Pulse: 89 94 92 96  Resp: 20 20 19  (!) 25  Temp:      TempSrc:      SpO2: 100% 100% 99% 97%  Weight:      Height:        Constitutional: Elderly gentleman, on bipap, no distress Eyes: lids and conjunctivae normal ENMT: BIpap is in place Neck: normal, supple Respiratory: Course breath sounds at bases, breathing easily on bipap, no apparent wheezing over bipap sounds.  Cardiovascular: RR, NR, no murmur noted (though bipap was loud), + pedal edema, 2+ to the mid shins.  Abdomen: + BS, tender to palpation throughout, some voluntary guarding though diffuse, non distended Musculoskeletal: Tone was normal, bulk decreased Skin: Chronic purpura on arms and legs, chronic skin changes of sun damage Neurologic: alert, speaking clearly through bipap, moving  all extremities equally Psychiatric: Normal judgment and insight. Alert and oriented x 3. Normal mood.    Labs on Admission: I have personally reviewed following labs and imaging studies  CBC: Recent Labs  Lab 07/19/22 1619 07/19/22 1627 07/19/22 1858 07/20/22 0450 07/20/22 1354  WBC 10.4  --   --   --  12.8*  NEUTROABS 7.9*  --   --   --   --   HGB 10.2* 12.2* 11.2* 11.9* 10.6*  HCT 34.6* 36.0* 33.0* 35.0* 34.5*  MCV 100.9*  --   --   --  98.3  PLT 195  --   --   --  205    Basic Metabolic Panel: Recent Labs  Lab 07/19/22 1619 07/19/22 1627 07/19/22 1858 07/20/22 0450 07/20/22 1354  NA 136 132* 132* 134*  --   K 4.3 4.3 4.3 4.0  --   CL 91*  --   --   --   --   CO2 38*  --   --   --   --   GLUCOSE 118*  --   --   --   --   BUN 34*  --   --   --   --   CREATININE 1.97*  --   --   --  1.92*  CALCIUM 9.2  --   --   --   --     GFR: Estimated Creatinine Clearance: 21.3 mL/min (A) (by C-G formula based on SCr of 1.92 mg/dL (H)).  Liver Function Tests: Recent Labs  Lab 07/19/22 1617   AST 37  ALT 72*  ALKPHOS 36*  BILITOT 0.4  PROT 6.7  ALBUMIN 4.5    Urine analysis:    Component Value Date/Time   COLORURINE COLORLESS (A) 07/19/2022 2328   APPEARANCEUR CLEAR 07/19/2022 2328   LABSPEC 1.007 07/19/2022 2328   PHURINE 6.5 07/19/2022 2328   GLUCOSEU NEGATIVE 07/19/2022 2328   HGBUR SMALL (A) 07/19/2022 2328   BILIRUBINUR NEGATIVE 07/19/2022 2328   KETONESUR NEGATIVE 07/19/2022 2328   PROTEINUR 30 (A) 07/19/2022 2328   NITRITE NEGATIVE 07/19/2022 2328   LEUKOCYTESUR NEGATIVE 07/19/2022 2328    Radiological Exams on Admission: DG Chest Portable 1 View  Result Date: 07/19/2022 CLINICAL DATA:  Shortness of breath, altered mental status. EXAM: PORTABLE CHEST 1 VIEW COMPARISON:  Chest radiograph 06/27/2021 FINDINGS: The cardiomediastinal silhouette is normal. Coarsened interstitial markings in both lungs are unchanged, chronic. There is hazy opacity in the right lower lobe and a probable small right pleural effusion. There is no other focal airspace opacity. There is no significant left effusion. There is no pneumothorax There is no acute osseous abnormality. IMPRESSION: Hazy right lower lobe opacity may reflect right lower lobe pneumonia and small parapneumonic effusion in the correct clinical setting. Electronically Signed   By: Lesia Hausen M.D.   On: 07/19/2022 17:26    EKG: Independently reviewed. EKG initially showed Aflutter and PVCs.  Will repeat  Assessment/Plan  Acute on chronic respiratory failure with hypoxia and hypercapnia (HCC) COPD (chronic obstructive pulmonary disease) (HCC) Early pneumonia - Given presentation, I think he likely had urinary symptoms and developed aspiration pneumonia due to somnolence.  However, his symptoms could be related to known bladder cancer or a true UTI.  UA today shows only hemoglobinuria.  He has a history of COPD and not on inhalers outpatient.  His hypercapnia is likely related to this and somnolence.  Overall, he is  improved on bipap.  - ABG -  if CO2 continues to come down, d/c bipap and start [place] oxygen (on 3L chronically) - Oxygen to keep sats > 90% - Rocephin and azithromycin for pneumonia - Check urinary antigens - Covid negative - WBC 12.8, trend    Essential hypertension - Hold home amlodipine in the setting of above - Restart when able    Hyperlipidemia - Hold home crestor until off of bipap  Possible Aflutter - Telemetry - Recheck EKG  Normocytic anemia - Initially macrocytic, now normocytic.  Likely related to chronic issues.  No source of blood loss, though likely with slow bleeding from known bladder cancer.     Cancer of overlapping sites of bladder - Monitor - He is due for outpatient follow up this week - may need to be rescheduled    AKI (acute kidney injury) (HCC) Stage 3a chronic kidney disease (CKD) (HCC) UTI (urinary tract infection) - Rocephin to complete treatment for UTI, UA bland now - AKI on CKD 3a - BUN/Cr ratio is 17, which is likely a mix of urinary retention and dehydration given illness.  He also has volume overload and was given a dose of lasix in the ED - Will wean off bipap, allow fluid intake, hold further diuresis for now - Bladder scan and strict I/Os to assess for urinary retention    Protein-calorie malnutrition, moderate (HCC) - Dietician consult     DVT prophylaxis: Lovenox  Code Status:   Full  Family Communication:  Discussed with patient's wife and daughter at bedside.  Patient has gone back and forth from full code to DNR.  Family is clear they would like him to remain full code right now.  Disposition Plan:   Patient is from:  Home  Anticipated DC to:  Home  Anticipated DC date:  07/23/22  Anticipated DC barriers: Improvement, ability to ambulate  Consults called:  RT Admission status:  Progressive, INP  Severity of Illness: The appropriate patient status for this patient is INPATIENT. Inpatient status is judged to be reasonable and  necessary in order to provide the required intensity of service to ensure the patient's safety. The patient's presenting symptoms, physical exam findings, and initial radiographic and laboratory data in the context of their chronic comorbidities is felt to place them at high risk for further clinical deterioration. Furthermore, it is not anticipated that the patient will be medically stable for discharge from the hospital within 2 midnights of admission.   * I certify that at the point of admission it is my clinical judgment that the patient will require inpatient hospital care spanning beyond 2 midnights from the point of admission due to high intensity of service, high risk for further deterioration and high frequency of surveillance required.Debe Coder MD [facility]  How to contact the [facility] Attending or Consulting provider 7A - 7P or covering provider during after hours 7P -7A, for this patient?   Check the care team in [facility] and look for a) attending/consulting [facility] provider listed and b) the [facility] team listed Log into [email or web address] and use [place]'s universal password to access. If you do not have the password, please contact the hospital operator. Locate the [facility] provider you are looking for under [facility] and page to a number that you can be directly reached. If you still have difficulty reaching the provider, please page the [facility] (Director on Call) for the Hospitalists listed on amion for assistance.  07/20/2022, 3:26 PM

## 2022-07-20 NOTE — Progress Notes (Signed)
Patient arrived via [facility] from [facility] on bipap - [facility] paged and made aware. Primary RN updated.

## 2022-07-20 NOTE — ED Notes (Signed)
Pt's wife and family at bedside and made aware of unknown transfer time. Wife wishes to stay with pt. Will provide recliner for her comfort. Should she not be able to transport with pt during transfer, family left 2 local contact numbers to call to assist with getting wife to transfer facility. Andi Devon [phone]; Nat Christen [phone]

## 2022-07-21 ENCOUNTER — Inpatient Hospital Stay (HOSPITAL_COMMUNITY): Payer: Medicare Other

## 2022-07-21 DIAGNOSIS — J9622 Acute and chronic respiratory failure with hypercapnia: Secondary | ICD-10-CM | POA: Diagnosis not present

## 2022-07-21 DIAGNOSIS — I4892 Unspecified atrial flutter: Secondary | ICD-10-CM

## 2022-07-21 DIAGNOSIS — N1831 Chronic kidney disease, stage 3a: Secondary | ICD-10-CM | POA: Diagnosis not present

## 2022-07-21 DIAGNOSIS — J9621 Acute and chronic respiratory failure with hypoxia: Secondary | ICD-10-CM | POA: Diagnosis not present

## 2022-07-21 DIAGNOSIS — I5031 Acute diastolic (congestive) heart failure: Secondary | ICD-10-CM | POA: Diagnosis not present

## 2022-07-21 DIAGNOSIS — I509 Heart failure, unspecified: Secondary | ICD-10-CM

## 2022-07-21 LAB — TROPONIN I (HIGH SENSITIVITY)
Troponin I (High Sensitivity): 47 ng/L — ABNORMAL HIGH (ref <18)
Troponin I (High Sensitivity): 47 ng/L — ABNORMAL HIGH (ref <18)

## 2022-07-21 LAB — BASIC METABOLIC PANEL
Anion gap: 13 (ref 5–15)
BUN: 31 mg/dL — ABNORMAL HIGH (ref 8–23)
CO2: 37 mmol/L — ABNORMAL HIGH (ref 22–32)
Calcium: 8.6 mg/dL — ABNORMAL LOW (ref 8.9–10.3)
Chloride: 86 mmol/L — ABNORMAL LOW (ref 98–111)
Creatinine, Ser: 1.8 mg/dL — ABNORMAL HIGH (ref 0.61–1.24)
GFR, Estimated: 35 mL/min — ABNORMAL LOW (ref >60)
Glucose, Bld: 85 mg/dL (ref 70–99)
Potassium: 3.9 mmol/L (ref 3.5–5.1)
Sodium: 136 mmol/L (ref 135–145)

## 2022-07-21 LAB — LEGIONELLA PNEUMOPHILA SEROGP 1 UR AG: L. pneumophila Serogp 1 Ur Ag: NEGATIVE

## 2022-07-21 LAB — CBC
HCT: 32.6 % — ABNORMAL LOW (ref 39.0–52.0)
Hemoglobin: 9.9 g/dL — ABNORMAL LOW (ref 13.0–17.0)
MCH: 29.8 pg (ref 26.0–34.0)
MCHC: 30.4 g/dL (ref 30.0–36.0)
MCV: 98.2 fL (ref 80.0–100.0)
Platelets: 196 10*3/uL (ref 150–400)
RBC: 3.32 MIL/uL — ABNORMAL LOW (ref 4.22–5.81)
RDW: 13.4 % (ref 11.5–15.5)
WBC: 13.6 10*3/uL — ABNORMAL HIGH (ref 4.0–10.5)
nRBC: 0 % (ref 0.0–0.2)

## 2022-07-21 LAB — ECHOCARDIOGRAM COMPLETE
AR max vel: 1.62 cm2
AV Area VTI: 1.77 cm2
AV Area mean vel: 1.57 cm2
AV Mean grad: 4 mmHg
AV Peak grad: 6.7 mmHg
Ao pk vel: 1.29 m/s
Area-P 1/2: 4.67 cm2
Height: 66 in
S' Lateral: 3.3 cm
Weight: 2334.4 oz

## 2022-07-21 MED ORDER — FUROSEMIDE 10 MG/ML IJ SOLN
40.0000 mg | Freq: Once | INTRAMUSCULAR | Status: AC
  Administered 2022-07-21: 40 mg via INTRAVENOUS
  Filled 2022-07-21: qty 4

## 2022-07-21 MED ORDER — ADULT MULTIVITAMIN W/MINERALS CH
1.0000 | ORAL_TABLET | Freq: Every day | ORAL | Status: DC
  Administered 2022-07-21 – 2022-07-27 (×7): 1 via ORAL
  Filled 2022-07-21 (×7): qty 1

## 2022-07-21 MED ORDER — METOPROLOL TARTRATE 25 MG PO TABS
25.0000 mg | ORAL_TABLET | Freq: Two times a day (BID) | ORAL | Status: DC
  Administered 2022-07-21 – 2022-07-24 (×5): 25 mg via ORAL
  Filled 2022-07-21 (×6): qty 1

## 2022-07-21 MED ORDER — APIXABAN 2.5 MG PO TABS
2.5000 mg | ORAL_TABLET | Freq: Two times a day (BID) | ORAL | Status: DC
  Administered 2022-07-21 – 2022-07-27 (×12): 2.5 mg via ORAL
  Filled 2022-07-21 (×13): qty 1

## 2022-07-21 MED ORDER — POLYETHYLENE GLYCOL 3350 17 G PO PACK: 17.0000 g | PACK | Freq: Every day | ORAL | Status: DC | PRN

## 2022-07-21 MED ORDER — METOPROLOL TARTRATE 25 MG PO TABS: 25.0000 mg | ORAL_TABLET | Freq: Two times a day (BID) | ORAL | Status: DC

## 2022-07-21 NOTE — Consult Note (Addendum)
Cardiology Consultation   Patient ID: Gerald Valdez MRN: [id number]; DOB: 1928-12-27  Admit date: 07/19/2022 Date of Consult: 07/21/2022  PCP:  [name], MD   [facility] Providers Cardiologist:  Kristeen Miss, MD   {  Patient Profile:   Gerald Valdez is a 89 y.o. male with a hx of coronary calcifications, hyperlipidemia, hypertension, COPD, bladder cancer, chronic respiratory failure with hypoxia, anemia, CKD stage IIIa, who is being seen 07/21/2022 for the evaluation of atrial flutter at the request of Dr. Jonathon Bellows.  History of Present Illness:   Gerald Valdez has no significant  prior cardiac history.  He is followed by Dr. Elease Hashimoto who was referred to him due to a CT incidentally finding 3 vessel coronary calcifications during a COPD exacerbation admission  Given his lack of symptoms further ischemic evaluation was deferred.  He is not on aspirin due to prior history of duodenal ulcer.  Echocardiogram in January 2022 showed normal LVEF of 50 to 55% with moderate calcifications of the aortic valve without any regurgitation or stenosis.  There was a 1x0.8cm calcified mobile density in the LVOT between AV and anterior MV leaflet but does not appear he was felt to require further workup for this.  Patient admitted on 07/20/2022 due to recent somnolence and confusion.  Recently patient was seen by PCP with complaints of pain in the abdomen along with urinary symptoms and was started on antibiotics about a week ago.  However due to somnolence that I antibiotics were stopped by wife due to concerns of drug effect.  In the ED he had elevated levels of CO2 and was placed on BiPAP, imaging showed possible pneumonia and started on antibiotics.  BNP 644.  TSH normal.  Troponins flat 47-47. EKG showed atrial flutter thus, cardiology has been consulted. He has not been started on any rate control medications or anticoagulation. Rates vary between 90s then brief spikes to the 140s.  Patient is  very hard of hearing so all communication was done with daughter and wife who are present in the room.  For the past week patient has had new onset of peripheral edema, increased shortness of breath, but no complaints of chest pain, palpitations, orthopnea, PND. Denies any falls or mobility issues. Ambulates independently at home. Patient is unaware of his arrhythmia.  Currently on 3 L via nasal cannula with improvement in cognition.  Past Medical History:  Diagnosis Date   Acute duodenal ulcer with bleeding 01/30/2014   Acute post-hemorrhagic anemia 01/30/2014   Anxiety    Aortic atherosclerosis (HCC) 02/10/2020   Arthritis    Basal cell carcinoma    Cancer (HCC)    bladder   Chronic kidney disease    COPD exacerbation (HCC) 02/10/2020   Coronary artery disease    Dyspnea    Dyspnea on exertion 02/10/2020   Emphysema of lung (HCC)    Essential hypertension 02/10/2020   GERD (gastroesophageal reflux disease) 02/10/2020   GI bleed 01/30/2014   Hyperlipidemia 02/10/2020    Past Surgical History:  Procedure Laterality Date   BLADDER SURGERY     CATARACT EXTRACTION W/ INTRAOCULAR LENS IMPLANT     CYSTOSCOPY W/ RETROGRADES Bilateral 11/20/2020   Procedure: CYSTOSCOPY WITH RETROGRADE PYELOGRAM;  Surgeon: Marcine Matar, MD;  Location: [facility];  Service: Urology;  Laterality: Bilateral;   CYSTOSCOPY W/ RETROGRADES Right 02/12/2021   Procedure: CYSTOSCOPY WITH RETROGRADE PYELOGRAM;  Surgeon: Marcine Matar, MD;  Location: [facility];  Service: Urology;  Laterality: Right;  CYSTOSCOPY W/ RETROGRADES Bilateral 06/25/2021   Procedure: CYSTOSCOPY WITH RETROGRADE PYELOGRAM;  Surgeon: Marcine Matar, MD;  Location: [facility];  Service: Urology;  Laterality: Bilateral;   ESOPHAGOGASTRODUODENOSCOPY N/A 01/30/2014   Procedure: ESOPHAGOGASTRODUODENOSCOPY (EGD);  Surgeon: Louis Meckel, MD;  Location: [facility];  Service: Endoscopy;  Laterality: N/A;   TONSILLECTOMY     TRANSURETHRAL  RESECTION OF BLADDER TUMOR N/A 02/12/2021   Procedure: REPEAT TRANSURETHRAL RESECTION OF BLADDER TUMOR (TURBT);  Surgeon: Marcine Matar, MD;  Location: [facility];  Service: Urology;  Laterality: N/A;   TRANSURETHRAL RESECTION OF BLADDER TUMOR WITH MITOMYCIN-C N/A 11/20/2020   Procedure: TRANSURETHRAL RESECTION OF BLADDER TUMOR;  Surgeon: Marcine Matar, MD;  Location: [facility];  Service: Urology;  Laterality: N/A;   TRANSURETHRAL RESECTION OF BLADDER TUMOR WITH MITOMYCIN-C N/A 06/25/2021   Procedure: TRANSURETHRAL RESECTION OF BLADDER TUMOR WITH GEMCITABINE;  Surgeon: Marcine Matar, MD;  Location: [facility];  Service: Urology;  Laterality: N/A;  1 HR   TRANSURETHRAL RESECTION OF BLADDER TUMOR WITH MITOMYCIN-C N/A 12/10/2021   Procedure: TRANSURETHRAL RESECTION OF BLADDER TUMOR WITH GEMCITABINE;  Surgeon: Marcine Matar, MD;  Location: [facility];  Service: Urology;  Laterality: N/A;  1 HR   TRANSURETHRAL RESECTION OF BLADDER TUMOR WITH MITOMYCIN-C N/A 05/27/2022   Procedure: TRANSURETHRAL RESECTION OF BLADDER TUMOR;  Surgeon: Marcine Matar, MD;  Location: [facility];  Service: Urology;  Laterality: N/A;     Inpatient Medications: Scheduled Meds:  enoxaparin (LOVENOX) injection  30 mg Subcutaneous Q24H   escitalopram  10 mg Oral Daily   multivitamin with minerals  1 tablet Oral Daily   rosuvastatin  20 mg Oral QHS   sodium chloride flush  3 mL Intravenous Q12H   umeclidinium bromide  1 puff Inhalation Daily   Continuous Infusions:  azithromycin Stopped (07/20/22 1913)   cefTRIAXone (ROCEPHIN)  IV 2 g (07/21/22 1229)   PRN Meds: ipratropium-albuterol  Allergies:   No Known Allergies  Social History:   Social History   Socioeconomic History   Marital status: Married    Spouse name: Not on file   Number of children: Not on file   Years of education: Not on file   Highest education level: Not on file  Occupational History   Occupation: retired  Tobacco Use   Smoking status:  Former    Years: 47    Types: Cigarettes    Start date: 1947    Quit date: 1994    Years since quitting: 30.4   Smokeless tobacco: Never  Vaping Use   Vaping Use: Never used  Substance and Sexual Activity   Alcohol use: Not Currently   Drug use: Never   Sexual activity: Not Currently  Other Topics Concern   Not on file  Social History Narrative   Not on file   Social Determinants of Health   Financial Resource Strain: Not on file  Food Insecurity: No Food Insecurity (05/27/2022)   Hunger Vital Sign    Worried About Running Out of Food in the Last Year: Never true    Ran Out of Food in the Last Year: Never true  Transportation Needs: No Transportation Needs (05/27/2022)   PRAPARE - Administrator, Civil Service (Medical): No    Lack of Transportation (Non-Medical): No  Physical Activity: Not on file  Stress: Not on file  Social Connections: Not on file  Intimate Partner Violence: Not At Risk (05/27/2022)   Humiliation, Afraid, Rape, and Kick questionnaire    Fear of Current  or Ex-Partner: No    Emotionally Abused: No    Physically Abused: No    Sexually Abused: No    Family History:    Family History  Problem Relation Age of Onset   Stroke Father      ROS:  Please see the history of present illness.   All other ROS reviewed and negative.     Physical Exam/Data:   Vitals:   07/21/22 0355 07/21/22 0722 07/21/22 0756 07/21/22 1230  BP: 117/61  135/60 (!) 108/53  Pulse: (!) 104  (!) 56 93  Resp: 17   (!) 22  Temp: 98 F (36.7 C)  98 F (36.7 C) 97.9 F (36.6 C)  TempSrc: Oral  Oral Oral  SpO2: 100% 100%  100%  Weight: 66.2 kg     Height:        Intake/Output Summary (Last 24 hours) at 07/21/2022 1541 Last data filed at 07/21/2022 1500 Gross per 24 hour  Intake 250 ml  Output 300 ml  Net -50 ml      07/21/2022    3:55 AM 07/19/2022    3:58 PM 05/27/2022    2:35 PM  Last 3 Weights  Weight (lbs) 145 lb 14.4 oz 132 lb 11.5 oz 132 lb 12.8 oz   Weight (kg) 66.18 kg 60.2 kg 60.238 kg     Body mass index is 23.55 kg/m.  General:  Well nourished, well developed, in no acute distress.  On 3 L via nasal cannula HEENT: normal Neck: + JVD Vascular: No carotid bruits; Distal pulses 2+ bilaterally Cardiac:  normal S1, S2; RRR; no murmur, tachycardic Lungs:  clear to auscultation bilaterally, no wheezing, rhonchi or rales  Abd: soft, nontender, no hepatomegaly  Ext: 1-2+ pitting edema Musculoskeletal:  No deformities, BUE and BLE strength normal and equal Skin: warm and dry  Neuro:  CNs 2-12 intact, no focal abnormalities noted Psych:  Normal affect   EKG:  The EKG was personally reviewed and demonstrates:  EKG showing atrial flutter, heart rate 99.  Has nonspecific ST-T wave changes in lateral leads.  Occasional PVCs. Telemetry:  Telemetry was personally reviewed and demonstrates: Atrial flutter with heart rates around 100 however intermittently rise in the are sustained in the 140s.  Relevant CV Studies:   Laboratory Data:  High Sensitivity Troponin:   Recent Labs  Lab 07/19/22 1619 07/21/22 1001 07/21/22 1201  TROPONINIHS 42* 47* 47*     Chemistry Recent Labs  Lab 07/19/22 1619 07/19/22 1627 07/19/22 1858 07/20/22 0450 07/20/22 1354 07/21/22 0648  NA 136   < > 132* 134*  --  136  K 4.3   < > 4.3 4.0  --  3.9  CL 91*  --   --   --   --  86*  CO2 38*  --   --   --   --  37*  GLUCOSE 118*  --   --   --   --  85  BUN 34*  --   --   --   --  31*  CREATININE 1.97*  --   --   --  1.92* 1.80*  CALCIUM 9.2  --   --   --   --  8.6*  GFRNONAA 31*  --   --   --  32* 35*  ANIONGAP 7  --   --   --   --  13   < > = values in this interval not displayed.  Recent Labs  Lab 07/19/22 1617  PROT 6.7  ALBUMIN 4.5  AST 37  ALT 72*  ALKPHOS 36*  BILITOT 0.4   Lipids No results for input(s): "CHOL", "TRIG", "HDL", "LABVLDL", "LDLCALC", "CHOLHDL" in the last 168 hours.  Hematology Recent Labs  Lab 07/19/22 1619  07/19/22 1627 07/20/22 0450 07/20/22 1354 07/21/22 0648  WBC 10.4  --   --  12.8* 13.6*  RBC 3.43*  --   --  3.51* 3.32*  HGB 10.2*   < > 11.9* 10.6* 9.9*  HCT 34.6*   < > 35.0* 34.5* 32.6*  MCV 100.9*  --   --  98.3 98.2  MCH 29.7  --   --  30.2 29.8  MCHC 29.5*  --   --  30.7 30.4  RDW 13.4  --   --  13.2 13.4  PLT 195  --   --  205 196   < > = values in this interval not displayed.   Thyroid  Recent Labs  Lab 07/19/22 1619  TSH 2.145    BNP Recent Labs  Lab 07/19/22 1619  BNP 644.6*    DDimer No results for input(s): "DDIMER" in the last 168 hours.   Radiology/Studies:  DG Chest Portable 1 View  Result Date: 07/19/2022 CLINICAL DATA:  Shortness of breath, altered mental status. EXAM: PORTABLE CHEST 1 VIEW COMPARISON:  Chest radiograph 06/27/2021 FINDINGS: The cardiomediastinal silhouette is normal. Coarsened interstitial markings in both lungs are unchanged, chronic. There is hazy opacity in the right lower lobe and a probable small right pleural effusion. There is no other focal airspace opacity. There is no significant left effusion. There is no pneumothorax There is no acute osseous abnormality. IMPRESSION: Hazy right lower lobe opacity may reflect right lower lobe pneumonia and small parapneumonic effusion in the correct clinical setting. Electronically Signed   By: Lesia Hausen M.D.   On: 07/19/2022 17:26     Assessment and Plan:   New onset atrial flutter Patient presenting with somnolence/altered mental status and found to be hypercapnic.  This is in the setting of recent urinary infection and pneumonia.  Heart rates uncontrolled and are around 105, however has intermittent jumps to 140s that are sustained.  Patient hypervolemic on exam with notable JVD and 1-2+ pitting edema.   Echocardiogram pending Starting Lopressor 25 mg twice daily for rate control Has prior history of remote duodenal ulcer, however given his increased stroke risk would like to attempt to  trial DOAC with close monitoring of bleeding complications. Will need reduced dosing given age and renal function.  Starting Eliquis 2.5mg  BID.  TSH normal  Would plan for TEE/DCCV if difficult to rate control.  If rates well-controlled, can plan outpatient follow-up and cardioversion once completed 3 weeks of anticoagulation if remains in aflutter  Acute heart failure Prior echocardiogram showing normal LVEF of 55 to 60% in 2022.  BNP 644 during this admission with signs of congestion as noted above. Suspect he might have reduced EF. Echocardiogram pending Start IV Lasix 40 mg daily  Coronary calcifications noted on CT in 2022 Hyperlipidemia No anginal symptoms.  Continue to monitor.  Continue rosuvastatin 20 mg.  Starting Eliquis as above  Demand ischemia  Has slightly elevated troponins that are flat at 47-47.  Likely due to demand ischemia.  Hypertension PTA amlodipine 5 mg daily.  Continue to hold given greater need for rate control and soft pressures.  COPD Pneumonia Hypercapnia (improved) CKD stage IIIa difficult to measure baseline.  However creatinine appears to be around 1.8 Risk Assessment/Risk Scores:   New York Heart Association (NYHA) Functional Class NYHA Class II  CHA2DS2-VASc Score = 3  This indicates a 3.2% annual risk of stroke. The patient's score is based upon: CHF History: 0 HTN History: 1 Diabetes History: 0 Stroke History: 0 Vascular Disease History: 0 Age Score: 2 Gender Score: 0   For questions or updates, please contact [facility] Please consult [email or web address] for contact info under    Signed, Abagail Kitchens, PA-C  07/21/2022 3:41 PM   Patient seen and examined.  Agree with above documentation.  Gerald Valdez is a 89 year old male with a history of COPD, hypertension, bladder cancer, chronic respiratory failure, CKD stage IIIa who we are consulted for evaluation of atrial flutter at the request of Dr. Jonathon Bellows.  He presented 07/19/2022 with  confusion.  In ED, initial vital signs notable for BP 109/60, pulse 89.  VBG showed pCO2 102, was started on BiPAP.  Labs notable for creatinine 1.97, BNP 645, bicarb 38, hemoglobin 10.2, platelets 195, TSH 2.1, troponin 47 > 47.  EKG shows atrial flutter with variable conduction, rate 101.  Review of telemetry shows atrial flutter with rates 90s to 140.  On exam, patient is alert and oriented, irregular rhythm, tachycardic, no murmurs, diminished breath sounds, 1+ LE edema, + JVD.  For his atrial flutter, recommend starting metoprolol 25 mg twice daily for rate control.  Start Eliquis for anticoagulation, 2.5 mg twice daily given his age/renal function.  He appears volume overloaded on exam, will follow-up echocardiogram and start IV Lasix 40 mg daily.  Little Ishikawa, MD

## 2022-07-21 NOTE — TOC Initial Note (Signed)
Transition of Care [facility]) - Initial/Assessment Note    Patient Details  Name: Gerald Valdez MRN: [id number] Date of Birth: [birth year]  [facility]) CM/SW Contact:    Gala Lewandowsky, RN Phone Number: 07/21/2022, 3:01 PM  Clinical Narrative:  Patient presented for somnolence and confusion. Spouse and daughter at the bedside during the visit. PTA spouse states patient was from home and has DME oxygen baseline 3 liters ([employer]), shower chair, rolling walker, rollator, and bedside commode. Spouse states she takes patient to all appointments. PT/OT has been consulted for recommendations. Case Manager will continue to follow for transition of care needs.              Expected Discharge Plan: Home w Home Health Services Barriers to Discharge: Continued Medical Work up   Patient Goals and CMS Choice     Choice offered to / list presented to : NA      Expected Discharge Plan and Services In-house Referral: NA Discharge Planning Services: CM Consult Post Acute Care Choice: NA Living arrangements for the past 2 months: Single Family Home                   DME Agency: NA    Prior Living Arrangements/Services Living arrangements for the past 2 months: Single Family Home Lives with:: Spouse (shower chair, rollator, rolling walker, oxygen 3 liters, bedside commode.) Patient language and need for interpreter reviewed:: Yes Do you feel safe going back to the place where you live?: Yes      Need for Family Participation in Patient Care: Yes (Comment) Care giver support system in place?: Yes (comment) Current home services: DME Criminal Activity/Legal Involvement Pertinent to Current Situation/Hospitalization: No - Comment as needed  Permission Sought/Granted Permission sought to share information with : Family Supports, Case Manager   Emotional Assessment Appearance:: Appears stated age Attitude/Demeanor/Rapport: Unable to Assess Affect (typically observed):  Unable to Assess Orientation: : Oriented to Self, Oriented to Place   Psych Involvement: No (comment)  Admission diagnosis:  SOB (shortness of breath) [R06.02] Acute on chronic respiratory failure with hypoxia and hypercapnia (HCC) [J96.21, J96.22] Acute on chronic hypoxic respiratory failure (HCC) [J96.21] Patient Active Problem List   Diagnosis Date Noted   Acute on chronic hypoxic respiratory failure (HCC) 07/20/2022   Acute on chronic respiratory failure with hypoxia and hypercapnia (HCC) 07/19/2022   Malignant neoplasm of bladder wall (HCC) 05/27/2022   Hypokalemia 07/04/2021   Thrombocytopenia (HCC) 07/04/2021   Iron deficiency anemia 07/04/2021   Protein-calorie malnutrition, severe 07/03/2021   Dehydration 07/01/2021   AKI (acute kidney injury) (HCC) 07/01/2021   Stage 3a chronic kidney disease (CKD) (HCC) 07/01/2021   Protein-calorie malnutrition, moderate (HCC) 07/01/2021   Diarrhea 07/01/2021   UTI (urinary tract infection) 07/01/2021   Sepsis (HCC) 07/01/2021   Chronic respiratory failure with hypoxia (HCC) 07/01/2021   Hyperglycemia 07/01/2021   Normocytic anemia 07/01/2021   Urinary retention 06/26/2021   Cancer of overlapping sites of bladder (HCC) 06/25/2021   Malignant neoplasm of overlapping sites of bladder (HCC) 11/20/2020   COPD (chronic obstructive pulmonary disease) (HCC) 02/10/2020   Aortic atherosclerosis (HCC) 02/10/2020   Dyspnea on exertion 02/10/2020   Essential hypertension 02/10/2020   Hyperlipidemia 02/10/2020   GERD (gastroesophageal reflux disease) 02/10/2020   GI bleed 01/30/2014   Acute post-hemorrhagic anemia 01/30/2014   Acute duodenal ulcer with bleeding 01/30/2014   PCP:  [name], MD Pharmacy:   [facility] - [place], [place] -  [address] [place] [address] Phone: [phone] Fax: [phone]  Social Determinants of Health (SDOH) Social History: SDOH  Screenings   Food Insecurity: No Food Insecurity (05/27/2022)  Housing: Low Risk  (05/27/2022)  Transportation Needs: No Transportation Needs (05/27/2022)  Utilities: Not At Risk (05/27/2022)  Tobacco Use: Medium Risk (07/20/2022)   SDOH Interventions:     Readmission Risk Interventions     No data to display

## 2022-07-21 NOTE — Evaluation (Signed)
Physical Therapy Evaluation Patient Details Name: Gerald Valdez MRN: [id number] DOB: 01-01-1929 Today's Date: 07/21/2022  History of Present Illness  Patient is a 89 year old male who presented on 6/10 with increased somnolence and abdominal pain. Patient was admitted with early pneumonia, acute on chronic respiratory failure with hypoxia and hypercapnia, and possible a flutter. PMH: anxiety, bladder CA, GI bleed, COPD, emphysema of lung, CKD.  Clinical Impression  Pt presents with admitting diagnosis above. Co treat with OT. Today pt required +2 Min A to stand and take side steps along EOB. Further mobility limited due to pt going into A flutter. MD and RN present. Recommend SNF upon DC. PT will continue to follow.     Recommendations for follow up therapy are one component of a multi-disciplinary discharge planning process, led by the attending physician.  Recommendations may be updated based on patient status, additional functional criteria and insurance authorization.  Follow Up Recommendations Can patient physically be transported by private vehicle: No     Assistance Recommended at Discharge Frequent or constant Supervision/Assistance  Patient can return home with the following  A lot of help with walking and/or transfers;A lot of help with bathing/dressing/bathroom;Assistance with cooking/housework;Direct supervision/assist for medications management;Assist for transportation;Help with stairs or ramp for entrance    Equipment Recommendations Other (comment) (Per accepting facility)  Recommendations for Other Services       Functional Status Assessment Patient has had a recent decline in their functional status and demonstrates the ability to make significant improvements in function in a reasonable and predictable amount of time.     Precautions / Restrictions Precautions Precautions: Other (comment) Precaution Comments: monitor HR Restrictions Weight Bearing Restrictions: No       Mobility  Bed Mobility Overal bed mobility: Needs Assistance Bed Mobility: Supine to Sit, Sit to Supine     Supine to sit: Min guard Sit to supine: Min assist   General bed mobility comments: patient needed increased cues for transitioning to EOB with patient needed physical A for advancement of BLE back into bed.    Transfers Overall transfer level: Needs assistance Equipment used: Rolling walker (2 wheels) Transfers: Sit to/from Stand Sit to Stand: +2 physical assistance, Min assist           General transfer comment: +2 Min A for side steps along EOB. Cues for hand placement. HR up to 142 with transfer.    Ambulation/Gait               General Gait Details: Deferred due to HR.  Stairs            Wheelchair Mobility    Modified Rankin (Stroke Patients Only)       Balance Overall balance assessment: Needs assistance Sitting-balance support: Single extremity supported, Feet supported Sitting balance-Leahy Scale: Fair     Standing balance support: Reliant on assistive device for balance, Bilateral upper extremity supported Standing balance-Leahy Scale: Poor                               Pertinent Vitals/Pain Pain Assessment Pain Assessment: No/denies pain    Home Living Family/patient expects to be discharged to:: Private residence Living Arrangements: Spouse/significant other Available Help at Discharge: Family;Available 24 hours/day Type of Home: House Home Access: Ramped entrance       Home Layout: One level Home Equipment: Rollator (4 wheels);Rolling Walker (2 wheels);Tub bench;BSC/3in1;Hand held shower head Additional Comments: Pt wears  3L at baseline. Has concentrator at home    Prior Function Prior Level of Function : Independent/Modified Independent             Mobility Comments: Mod I rollator ADLs Comments: Wife helps min A for LB dressing/bathing, and offers more A for ECT strategy for patient some  days. patient reports being able to get himself to bathroom and complete toileting tasks at home.     Hand Dominance   Dominant Hand: Right    Extremity/Trunk Assessment   Upper Extremity Assessment Upper Extremity Assessment: RUE deficits/detail RUE Deficits / Details: h/o RTC tear.elbow hand and wrist WFL    Lower Extremity Assessment Lower Extremity Assessment: Overall WFL for tasks assessed    Cervical / Trunk Assessment Cervical / Trunk Assessment: Kyphotic  Communication   Communication: HOH  Cognition Arousal/Alertness: Awake/alert Behavior During Therapy: WFL for tasks assessed/performed Overall Cognitive Status: Within Functional Limits for tasks assessed                                 General Comments: Patient is HOH but oriented to place and date. patient's daughter and wife in room attempted to helped with answering PLOF questions.        General Comments General comments (skin integrity, edema, etc.): patients BP was 137/65 mmhg with HR ranging from 90-110s prior to advancing to EOB. HR up to 140-142 bpm sitting EOB    Exercises     Assessment/Plan    PT Assessment Patient needs continued PT services  PT Problem List Decreased strength;Decreased range of motion;Decreased activity tolerance;Decreased balance;Decreased mobility;Decreased coordination;Decreased knowledge of use of DME;Decreased safety awareness;Decreased knowledge of precautions;Cardiopulmonary status limiting activity       PT Treatment Interventions DME instruction;Gait training;Stair training;Functional mobility training;Therapeutic activities;Therapeutic exercise;Balance training;Neuromuscular re-education;Patient/family education    PT Goals (Current goals can be found in the Care Plan section)  Acute Rehab PT Goals Patient Stated Goal: to go home PT Goal Formulation: With patient Time For Goal Achievement: 08/04/22 Potential to Achieve Goals: Fair    Frequency Min  1X/week     Co-evaluation PT/OT/SLP Co-Evaluation/Treatment: Yes Reason for Co-Treatment: To address functional/ADL transfers;For patient/therapist safety PT goals addressed during session: Mobility/safety with mobility OT goals addressed during session: ADL's and self-care       AM-PAC PT "6 Clicks" Mobility  Outcome Measure Help needed turning from your back to your side while in a flat bed without using bedrails?: A Little Help needed moving from lying on your back to sitting on the side of a flat bed without using bedrails?: A Little Help needed moving to and from a bed to a chair (including a wheelchair)?: A Little Help needed standing up from a chair using your arms (e.g., wheelchair or bedside chair)?: A Lot Help needed to walk in hospital room?: A Lot Help needed climbing 3-5 steps with a railing? : Total 6 Click Score: 14    End of Session Equipment Utilized During Treatment: Gait belt Activity Tolerance: Patient tolerated treatment well Patient left: in bed;with call bell/phone within reach;with nursing/sitter in room;Other (comment);with family/visitor present (MD and RN present) Nurse Communication: Mobility status;Other (comment) (HR) PT Visit Diagnosis: Other abnormalities of gait and mobility (R26.89);Unsteadiness on feet (R26.81);Muscle weakness (generalized) (M62.81);Difficulty in walking, not elsewhere classified (R26.2)    Time: 1610-9604 PT Time Calculation (min) (ACUTE ONLY): 24 min   Charges:   PT Evaluation $PT Eval Moderate Complexity:  1 Mod PT Treatments $Therapeutic Activity: 8-22 mins        Shela Nevin, PT, DPT Acute Rehab Services [phone]   Gladys Damme 07/21/2022, 4:16 PM

## 2022-07-21 NOTE — Evaluation (Signed)
Clinical/Bedside Swallow Evaluation Patient Details  Name: Gerald Valdez MRN: [id number] Date of Birth: 11-28-1928  Today's Date: 07/21/2022 Time: SLP Start Time (ACUTE ONLY): 1015 SLP Stop Time (ACUTE ONLY): 1025 SLP Time Calculation (min) (ACUTE ONLY): 10 min  Past Medical History:  Past Medical History:  Diagnosis Date   Acute duodenal ulcer with bleeding 01/30/2014   Acute post-hemorrhagic anemia 01/30/2014   Anxiety    Aortic atherosclerosis (HCC) 02/10/2020   Arthritis    Basal cell carcinoma    Cancer (HCC)    bladder   Chronic kidney disease    COPD exacerbation (HCC) 02/10/2020   Coronary artery disease    Dyspnea    Dyspnea on exertion 02/10/2020   Emphysema of lung (HCC)    Essential hypertension 02/10/2020   GERD (gastroesophageal reflux disease) 02/10/2020   GI bleed 01/30/2014   Hyperlipidemia 02/10/2020   Past Surgical History:  Past Surgical History:  Procedure Laterality Date   BLADDER SURGERY     CATARACT EXTRACTION W/ INTRAOCULAR LENS IMPLANT     CYSTOSCOPY W/ RETROGRADES Bilateral 11/20/2020   Procedure: CYSTOSCOPY WITH RETROGRADE PYELOGRAM;  Surgeon: Marcine Matar, MD;  Location: [facility];  Service: Urology;  Laterality: Bilateral;   CYSTOSCOPY W/ RETROGRADES Right 02/12/2021   Procedure: CYSTOSCOPY WITH RETROGRADE PYELOGRAM;  Surgeon: Marcine Matar, MD;  Location: [facility];  Service: Urology;  Laterality: Right;   CYSTOSCOPY W/ RETROGRADES Bilateral 06/25/2021   Procedure: CYSTOSCOPY WITH RETROGRADE PYELOGRAM;  Surgeon: Marcine Matar, MD;  Location: [facility];  Service: Urology;  Laterality: Bilateral;   ESOPHAGOGASTRODUODENOSCOPY N/A 01/30/2014   Procedure: ESOPHAGOGASTRODUODENOSCOPY (EGD);  Surgeon: Louis Meckel, MD;  Location: [facility];  Service: Endoscopy;  Laterality: N/A;   TONSILLECTOMY     TRANSURETHRAL RESECTION OF BLADDER TUMOR N/A 02/12/2021   Procedure: REPEAT TRANSURETHRAL RESECTION OF BLADDER TUMOR (TURBT);  Surgeon:  Marcine Matar, MD;  Location: [facility];  Service: Urology;  Laterality: N/A;   TRANSURETHRAL RESECTION OF BLADDER TUMOR WITH MITOMYCIN-C N/A 11/20/2020   Procedure: TRANSURETHRAL RESECTION OF BLADDER TUMOR;  Surgeon: Marcine Matar, MD;  Location: [facility];  Service: Urology;  Laterality: N/A;   TRANSURETHRAL RESECTION OF BLADDER TUMOR WITH MITOMYCIN-C N/A 06/25/2021   Procedure: TRANSURETHRAL RESECTION OF BLADDER TUMOR WITH GEMCITABINE;  Surgeon: Marcine Matar, MD;  Location: [facility];  Service: Urology;  Laterality: N/A;  1 HR   TRANSURETHRAL RESECTION OF BLADDER TUMOR WITH MITOMYCIN-C N/A 12/10/2021   Procedure: TRANSURETHRAL RESECTION OF BLADDER TUMOR WITH GEMCITABINE;  Surgeon: Marcine Matar, MD;  Location: [facility];  Service: Urology;  Laterality: N/A;  1 HR   TRANSURETHRAL RESECTION OF BLADDER TUMOR WITH MITOMYCIN-C N/A 05/27/2022   Procedure: TRANSURETHRAL RESECTION OF BLADDER TUMOR;  Surgeon: Marcine Matar, MD;  Location: [facility];  Service: Urology;  Laterality: N/A;   HPI:  Patient is a 89 y.o. male with PMH: chronic respiratory failure with hypoxia, malnutrition, anxiety, COPD, CKD, GERD, dyspnea, GI bleed, CAD, basal cell carcinoma. He presented to the hospital on 07/20/22 with c/o somnolence and confusion. He was wearing a BiPAP on arrival to ED. In ED, CO2 was very high (>100) and he was placed on BiPAP; imaging showed possible PNA; EKG initially showed aflutter and PVC's. He was weaned from BiPAP on 6/11 to 3L via nasal cannula.    Assessment / Plan / Recommendation  Clinical Impression  Patient is not currently presenting with any clinical s/s of dysphagia as per this bedside swallow evaluation. His spouse and daughter were in the room  and they both denied him having any current or h/o swallowing difficulties. His daughter stated that as he has become less active, his appetite and PO intake has decreased. SLP assessed patient's swallow function with thin liquids, puree  solids and regular solids (sausage biscuit that daughter had brought). He was able to feed himself. Swallow initiation was timely and no overt s/s aspiration observed. SLP recommending regular solids ,thin liquids and no f/u indicated. SLP Visit Diagnosis: Dysphagia, unspecified (R13.10)    Aspiration Risk  No limitations    Diet Recommendation Regular;Thin liquid   Liquid Administration via: Cup;Straw Medication Administration: Whole meds with liquid Supervision: Patient able to self feed Compensations: Slow rate;Small sips/bites Postural Changes: Seated upright at 90 degrees;Remain upright for at least 30 minutes after po intake    Other  Recommendations Oral Care Recommendations: Oral care BID    Recommendations for follow up therapy are one component of a multi-disciplinary discharge planning process, led by the attending physician.  Recommendations may be updated based on patient status, additional functional criteria and insurance authorization.  Follow up Recommendations No SLP follow up      Assistance Recommended at Discharge    Functional Status Assessment Patient has not had a recent decline in their functional status  Frequency and Duration   N/A         Prognosis   N/A     Swallow Study   General Date of Onset: 07/20/22 HPI: Patient is a 89 y.o. male with PMH: chronic respiratory failure with hypoxia, malnutrition, anxiety, COPD, CKD, GERD, dyspnea, GI bleed, CAD, basal cell carcinoma. He presented to the hospital on 07/20/22 with c/o somnolence and confusion. He was wearing a BiPAP on arrival to ED. In ED, CO2 was very high (>100) and he was placed on BiPAP; imaging showed possible PNA; EKG initially showed aflutter and PVC's. He was weaned from BiPAP on 6/11 to 3L via nasal cannula. Type of Study: Bedside Swallow Evaluation Previous Swallow Assessment: none found Diet Prior to this Study: NPO Temperature Spikes Noted: No Respiratory Status: Nasal  cannula History of Recent Intubation: No Behavior/Cognition: Alert;Cooperative;Pleasant mood Oral Cavity Assessment: Within Functional Limits Oral Care Completed by SLP: No Oral Cavity - Dentition: Adequate natural dentition Vision: Functional for self-feeding Self-Feeding Abilities: Able to feed self Patient Positioning: Upright in bed Baseline Vocal Quality: Normal Volitional Cough: Strong Volitional Swallow: Able to elicit    Oral/Motor/Sensory Function Overall Oral Motor/Sensory Function: Within functional limits   Ice Chips     Thin Liquid Thin Liquid: Within functional limits Presentation: Straw;Self Fed    Nectar Thick     Honey Thick     Puree Puree: Within functional limits Presentation: Self Fed   Solid     Solid: Within functional limits Presentation: Self Fed      Angela Nevin, MA, CCC-SLP Speech Therapy

## 2022-07-21 NOTE — Progress Notes (Signed)
Initial Nutrition Assessment  DOCUMENTATION CODES:      INTERVENTION:  If patient is safe for po diet and thin liquids, RD recommends Ensure Plus High Protein po BID, each supplement provides 350 kcal and 20 grams of protein.  MVI    NUTRITION DIAGNOSIS:   Inadequate oral intake related to inability to eat as evidenced by NPO status.  GOAL:   Patient will meet greater than or equal to 90% of their needs   MONITOR:   Diet advancement, Skin, Weight trends, I & O's, Labs  REASON FOR ASSESSMENT:   Consult Assessment of nutrition requirement/status  ASSESSMENT:   89 y.o. male with PMHx including h/o bladder cancer, chronic respiratory failure with hypoxia, COPD, HTN, GERD, HLD, h/o anemia, CKD 3a, malnutrition who presents from home for somnolence and confusion  Per MD notes:  - Patient suspected toinitially have a UTI and developed aspiration pneumonia due to somnolence  - patient has improved since being on bipap  Labs: Cl 86, BUN 31, Cr 1.8, alk phos 36, ALT 72, A1c 6.1 Meds: zithromax, rocephin, crestor, NS Wt: 6 kg wt gain? Possibly related to fluid overload  07/21/22 66.2 kg  05/27/22 60.2 kg  05/14/22 60.2 kg  12/10/21 59.9 kg  07/01/21 55.2 kg   PO: currently NPO I/O's:  +250 ml  NUTRITION - FOCUSED PHYSICAL EXAM:  RD working remotely  Diet Order:   Diet Order             Diet NPO time specified  Diet effective now                   EDUCATION NEEDS:      Skin:  Skin Assessment: Reviewed RN Assessment  Last BM:  PTA  Height:   Ht Readings from Last 1 Encounters:  07/19/22 5\' 6"  (1.676 m)    Weight:   Wt Readings from Last 1 Encounters:  07/21/22 66.2 kg    Ideal Body Weight:     BMI:  Body mass index is 23.55 kg/m.  Estimated Nutritional Needs:   Kcal:  1650-1980  Protein:  80-100 g  Fluid:  >1.7 L  Leodis Rains, RDN, LDN  Clinical Nutrition

## 2022-07-21 NOTE — Evaluation (Signed)
Occupational Therapy Evaluation Patient Details Name: Gerald Valdez MRN: [id number] DOB: Sep 08, 1928 Today's Date: 07/21/2022   History of Present Illness Patient is a 89 year old male who presented on 6/10 with increased somnolence and abdominal pain. Patient was admitted with early pneumonia, acute on chronic respiratory failure with hypoxia and hypercapnia, and possible a flutter. PMH: anxiety, bladder CA, GI bleed, COPD, emphysema of lung, CKD.   Clinical Impression   Patient is a 89 year old male who was admitted for above. Patient was living at home with min A for LB dress/bathing ADLs occasionally for ECT and independent in toileting on 2l/min at home. Patient was noted to have limited evaluation with HR sustaining to 140 sitting EOB with nurse called into room and MD entering as well. Patient was noted to have decreased functional activity tolerance, decreased endurance, decreased standing balance, decreased safety awareness, and decreased knowledge of AD/AE impacting participation in ADLs. Patient would continue to benefit from skilled OT services at this time while admitted and after d/c to address noted deficits in order to improve overall safety and independence in ADLs.       Recommendations for follow up therapy are one component of a multi-disciplinary discharge planning process, led by the attending physician.  Recommendations may be updated based on patient status, additional functional criteria and insurance authorization.   Assistance Recommended at Discharge Frequent or constant Supervision/Assistance  Patient can return home with the following A lot of help with bathing/dressing/bathroom;Assistance with cooking/housework;Direct supervision/assist for medications management;Assist for transportation;Help with stairs or ramp for entrance;Direct supervision/assist for financial management;A lot of help with walking and/or transfers    Functional Status Assessment  Patient has  had a recent decline in their functional status and demonstrates the ability to make significant improvements in function in a reasonable and predictable amount of time.  Equipment Recommendations  None recommended by OT       Precautions / Restrictions Precautions Precautions: Other (comment) Precaution Comments: monitor HR Restrictions Weight Bearing Restrictions: No      Mobility Bed Mobility Overal bed mobility: Needs Assistance Bed Mobility: Supine to Sit, Sit to Supine     Supine to sit: Min guard Sit to supine: Min assist   General bed mobility comments: patient needed increased cues for transitioning to EOB with patient needed physical A for advancement of BLE back into bed.        Balance Overall balance assessment: Needs assistance Sitting-balance support: Single extremity supported, Feet supported Sitting balance-Leahy Scale: Fair     Standing balance support: Reliant on assistive device for balance, Bilateral upper extremity supported Standing balance-Leahy Scale: Poor           ADL either performed or assessed with clinical judgement   ADL Overall ADL's : Needs assistance/impaired Eating/Feeding: Set up;Sitting   Grooming: Sitting;Min guard;Set up   Upper Body Bathing: Minimal assistance;Sitting   Lower Body Bathing: Maximal assistance;Sitting/lateral leans;Sit to/from stand   Upper Body Dressing : Min guard;Sitting   Lower Body Dressing: Sitting/lateral leans;Sit to/from stand;Maximal assistance   Toilet Transfer: Minimal assistance;+2 for physical assistance;+2 for safety/equipment;Rolling walker (2 wheels) Toilet Transfer Details (indicate cue type and reason): Patient noted to have HR reach 142 max bpm sitting EOB with nurse called into room and MD coming in as well. patient needed min a +2 for standing to change soiled linens and then transiton back into bed. Toileting- Clothing Manipulation and Hygiene: Total assistance;Sit to/from stand  Vision Baseline Vision/History: 1 Wears glasses Vision Assessment?: No apparent visual deficits            Pertinent Vitals/Pain Pain Assessment Pain Assessment: No/denies pain     Hand Dominance Right   Extremity/Trunk Assessment Upper Extremity Assessment Upper Extremity Assessment: RUE deficits/detail;Generalized weakness RUE Deficits / Details: h/o RTC tear.elbow hand and wrist WFL   Lower Extremity Assessment Lower Extremity Assessment: Defer to PT evaluation   Cervical / Trunk Assessment Cervical / Trunk Assessment: Kyphotic   Communication Communication Communication: HOH   Cognition Arousal/Alertness: Awake/alert Behavior During Therapy: WFL for tasks assessed/performed Overall Cognitive Status: Within Functional Limits for tasks assessed           General Comments: Patient is HOH but oriented to place and date. patient's daughter and wife in room attempted to helped with answering PLOF questions.     General Comments  patients BP was 137/65 mmhg with HR ranging from 90-110s prior to advancing to EOB. HR up to 140-142 bpm sitting EOB            Home Living Family/patient expects to be discharged to:: Private residence Living Arrangements: Spouse/significant other Available Help at Discharge: Family;Available 24 hours/day Type of Home: House Home Access: Ramped entrance     Home Layout: One level     Bathroom Shower/Tub: Chief Strategy Officer: Handicapped height Bathroom Accessibility: No   Home Equipment: Rollator (4 wheels);Rolling Walker (2 wheels);Tub bench;BSC/3in1;Hand held shower head   Additional Comments: Pt wears 3L at baseline. Has concentrator at home      Prior Functioning/Environment Prior Level of Function : Independent/Modified Independent             Mobility Comments: Mod I rollator ADLs Comments: Wife helps min A for LB dressing/bathing, and offers more A for ECT strategy for patient some  days. patient reports being able to get himself to bathroom and complete toileting tasks at home.        OT Problem List: Decreased activity tolerance;Impaired balance (sitting and/or standing);Decreased coordination;Decreased safety awareness;Decreased knowledge of precautions;Decreased knowledge of use of DME or AE;Cardiopulmonary status limiting activity      OT Treatment/Interventions: Self-care/ADL training;Therapeutic exercise;DME and/or AE instruction;Patient/family education;Therapeutic activities;Balance training    OT Goals(Current goals can be found in the care plan section) Acute Rehab OT Goals Patient Stated Goal: to get better OT Goal Formulation: With patient/family Time For Goal Achievement: 08/04/22 Potential to Achieve Goals: Fair  OT Frequency: Min 2X/week    Co-evaluation PT/OT/SLP Co-Evaluation/Treatment: Yes Reason for Co-Treatment: To address functional/ADL transfers;For patient/therapist safety PT goals addressed during session: Mobility/safety with mobility OT goals addressed during session: ADL's and self-care      AM-PAC OT "6 Clicks" Daily Activity     Outcome Measure Help from another person eating meals?: A Little Help from another person taking care of personal grooming?: A Little Help from another person toileting, which includes using toliet, bedpan, or urinal?: A Lot Help from another person bathing (including washing, rinsing, drying)?: A Lot Help from another person to put on and taking off regular upper body clothing?: A Lot Help from another person to put on and taking off regular lower body clothing?: A Lot 6 Click Score: 14   End of Session Equipment Utilized During Treatment: Gait belt;Rolling walker (2 wheels) Nurse Communication: Mobility status  Activity Tolerance: Patient tolerated treatment well Patient left: in bed;with call bell/phone within reach;Other (comment);with family/visitor present (nurse and MD in room)  OT Visit  Diagnosis: Unsteadiness on feet (R26.81);Other abnormalities of gait and mobility (R26.89)                Time: 1610-9604 OT Time Calculation (min): 24 min Charges:  OT General Charges $OT Visit: 1 Visit OT Evaluation $OT Eval Moderate Complexity: 1 Mod  Amoni Scallan OTR/L, MS Acute Rehabilitation Department Office# [phone]   Selinda Flavin 07/21/2022, 12:57 PM

## 2022-07-21 NOTE — Progress Notes (Signed)
PROGRESS NOTE Gerald Valdez  ZOX:[id number] DOB: 01/07/1929 DOA: 07/19/2022 PCP: [name], MD  Brief Narrative/Hospital Course: 89 year old man with history significant of bladder cancer, chronic respiratory failure with hypoxia, COPD, HTN, GERD, HLD, h/o anemia, CKD 3a, malnutrition  with UTI ( since last Wednesday, on antibiotics) presented with somnolence and confusion, likely due to his wife's stopping his antibiotics due to concern of drug effect. On last Friday more sleepy and on Sunday could no wake him up. In the ED found to have severe hypercapnia with pCO2 101, chest x-ray possible  RLL pneumonia w/ small parapneumonic effusion, creatinine elevated 1.9 stable above baseline of 1.3 BNP 644 troponin 42, WBC 10.4 and he has a macrocytic anemia, H/H 10 and 34. TSH was 2.145 , COVID-negative EKG showed a flutter with PVC.  Patient was placed on BiPAP, further is mentation improving.  Patient admitted [facility] for management.     Subjective: Seen thiS am HR in 140s while working w/ PT No chest pain palpitation or SBO Overnight afebrile weaned to 3 L nasal cannula BP stable Labs creatinine down to 1.8, mild leukocytosis hemoglobin 9.9 Family at bedside  Assessment and Plan: Principal Problem:   Acute on chronic respiratory failure with hypoxia and hypercapnia (HCC) Active Problems:   COPD (chronic obstructive pulmonary disease) (HCC)   Essential hypertension   Hyperlipidemia   Cancer of overlapping sites of bladder (HCC)   AKI (acute kidney injury) (HCC)   Stage 3a chronic kidney disease (CKD) (HCC)   Protein-calorie malnutrition, moderate (HCC)   UTI (urinary tract infection)   Chronic respiratory failure with hypoxia (HCC)   Acute on chronic respiratory failure with hypoxia and hypercapnia COPD RLL pneumonia with possible parapneumonic effusion: Patient needing BiPAP initially transition to nasal cannula.  At home on 3l cn all the time. Vitals stable labs with mild  leukocytosis.  Follow-up culture data.  Continue ceftriaxone/Romycin, supplemental oxygen.  Request PT OT evaluation.  Patient has history of COPD, not on inhalers. Smokes a pack per day for 40 years, likely has COPD- not seen by pulm, will need Pulm eval as some point. ?home bipap  Acute metabolic encephalopathy due to hypercapnia: Mentation overall much improved he is alert awake oriented follows commands. BIPAP  transitioned to nasal cannula 6/11. Consulted PT OT, swallow eval.  Continue delirium precaution fall precaution, avoid psychotropic medication.  Recent UTI, on antibiotics outpatient: UA unremarkable  Essential hypertension: BP stable, holding home amlodipine  Hyperlipidemia: Resume Crestor once able to take p.o.  A flutter with variable AV block w/ RVR- HR in 130-140s this am Appears new onset.TSH stable.Will request cardiology eva, use rate controlling agents cardizem po.   Normocytic anemia: HB stable. was macrocytic, now normocytic.  Likely related to chronic issues.  No source of blood loss, though likely with slow bleeding from known bladder cancer.  Recent Labs  Lab 07/19/22 1627 07/19/22 1858 07/20/22 0450 07/20/22 1354 07/21/22 0648  HGB 12.2* 11.2* 11.9* 10.6* 9.9*  HCT 36.0* 33.0* 35.0* 34.5* 32.6*       Cancer of overlapping sites of bladder:he is due for outpatient follow up this week - may need to be rescheduled    AKI on CKD 3a: Likely in the setting of patient's infection, dehydration. In ED s/p lasix, bnp was high.  Creatinine stable downtrending continue to monitor avoid nephrotoxic medications Recent Labs    12/10/21 1155 05/14/22 0930 07/19/22 1619 07/20/22 1354 07/21/22 0648  BUN 24* 31* 34*  --  31*  CREATININE 1.33* 1.24 1.97* 1.92* 1.80*  CO2 31 35* 38*  --  37*     Inadequate oral intake, at risk of malnutrition  Nutrition Problem: Inadequate oral intake Etiology: inability to eat Signs/Symptoms: NPO status Interventions: Refer to RD  note for recommendations, MVI, Ensure Enlive (each supplement provides 350kcal and 20 grams of protein)   Goals of care patient with advanced age and multiple medical comorbidities, he remains full code. DVT prophylaxis: enoxaparin (LOVENOX) injection 30 mg Start: 07/20/22 1500 Code Status:   Code Status: Full Code Family Communication: plan of care discussed with patient/wife and family at bedside. Patient status is: Inpatient because of hypercapnic respiratory failure  Level of care: Progressive   Dispo: The patient is from: Home with wife            Anticipated disposition: TBD Objective: Vitals last 24 hrs: Vitals:   07/21/22 0355 07/21/22 0722 07/21/22 0756 07/21/22 1230  BP: 117/61  135/60 (!) 108/53  Pulse: (!) 104  (!) 56 93  Resp: 17   (!) 22  Temp: 98 F (36.7 C)  98 F (36.7 C) 97.9 F (36.6 C)  TempSrc: Oral  Oral Oral  SpO2: 100% 100%  100%  Weight: 66.2 kg     Height:       Weight change: 5.98 kg  Physical Examination: General exam: alert awake, older than stated age HEENT:Oral mucosa moist, Ear/Nose WNL grossly Respiratory system: bilaterally clear BS, no use of accessory muscle Cardiovascular system: S1 & S2 +, irregular rhythm,No JVD. Gastrointestinal system: Abdomen soft,NT,ND, BS+ Nervous System:Alert, awake, moving extremities. Extremities: LE edema neg ,distal peripheral pulses palpable.  Skin: No rashes,no icterus. MSK: Normal muscle bulk,tone, power  Medications reviewed:  Scheduled Meds:  enoxaparin (LOVENOX) injection  30 mg Subcutaneous Q24H   escitalopram  10 mg Oral Daily   multivitamin with minerals  1 tablet Oral Daily   rosuvastatin  20 mg Oral QHS   sodium chloride flush  3 mL Intravenous Q12H   umeclidinium bromide  1 puff Inhalation Daily   Continuous Infusions:  azithromycin Stopped (07/20/22 1913)   cefTRIAXone (ROCEPHIN)  IV 2 g (07/21/22 1229)    Diet Order             Diet Heart Room service appropriate? Yes; Fluid  consistency: Thin  Diet effective now                   Intake/Output Summary (Last 24 hours) at 07/21/2022 1356 Last data filed at 07/21/2022 0300 Gross per 24 hour  Intake 350 ml  Output 100 ml  Net 250 ml   Net IO Since Admission: 250 mL [07/21/22 1356]  Wt Readings from Last 3 Encounters:  07/21/22 66.2 kg  05/27/22 60.2 kg  05/14/22 60.2 kg    Unresulted Labs (From admission, onward)     Start     Ordered   07/27/22 0500  Creatinine, serum  (enoxaparin (LOVENOX)    CrCl >/= 30 ml/min)  Weekly,   R     Comments: while on enoxaparin therapy    07/20/22 1208   07/22/22 0500  Basic metabolic panel  Daily,   R     Question:  Specimen collection method  Answer:  Lab=Lab collect   07/21/22 0744   07/22/22 0500  CBC  Daily,   R     Question:  Specimen collection method  Answer:  Lab=Lab collect   07/21/22 0744   07/20/22 1205  Legionella Pneumophila Serogp 1 Ur Ag  (COPD / Pneumonia / Cellulitis / Lower Extremity Wound)  Once,   R        07/20/22 1208          Data Reviewed: I have personally reviewed following labs and imaging studies CBC: Recent Labs  Lab 07/19/22 1619 07/19/22 1627 07/19/22 1858 07/20/22 0450 07/20/22 1354 07/21/22 0648  WBC 10.4  --   --   --  12.8* 13.6*  NEUTROABS 7.9*  --   --   --   --   --   HGB 10.2* 12.2* 11.2* 11.9* 10.6* 9.9*  HCT 34.6* 36.0* 33.0* 35.0* 34.5* 32.6*  MCV 100.9*  --   --   --  98.3 98.2  PLT 195  --   --   --  205 196   Basic Metabolic Panel: Recent Labs  Lab 07/19/22 1619 07/19/22 1627 07/19/22 1858 07/20/22 0450 07/20/22 1354 07/21/22 0648  NA 136 132* 132* 134*  --  136  K 4.3 4.3 4.3 4.0  --  3.9  CL 91*  --   --   --   --  86*  CO2 38*  --   --   --   --  37*  GLUCOSE 118*  --   --   --   --  85  BUN 34*  --   --   --   --  31*  CREATININE 1.97*  --   --   --  1.92* 1.80*  CALCIUM 9.2  --   --   --   --  8.6*   GFR: Estimated Creatinine Clearance: 24.1 mL/min (A) (by C-G formula based on SCr of  1.8 mg/dL (H)). Liver Function Tests: Recent Labs  Lab 07/19/22 1617  AST 37  ALT 72*  ALKPHOS 36*  BILITOT 0.4  PROT 6.7  ALBUMIN 4.5   No results for input(s): "LIPASE", "AMYLASE" in the last 168 hours. Recent Labs  Lab 07/19/22 1619  AMMONIA 28   Recent Labs    07/19/22 1619  TSH 2.145   Sepsis Labs: Recent Labs  Lab 07/19/22 1619  LATICACIDVEN 0.8    Recent Results (from the past 240 hour(s))  Blood culture (routine x 2)     Status: None (Preliminary result)   Collection Time: 07/19/22  4:15 PM   Specimen: BLOOD  Result Value Ref Range Status   Specimen Description   Final    BLOOD RIGHT ANTECUBITAL Performed at [facility], [address] [place], [place] [address]    Special Requests   Final    BOTTLES DRAWN AEROBIC AND ANAEROBIC Blood Culture adequate volume Performed at [facility], [address], [place], [place] [address]    Culture   Final    NO GROWTH 2 DAYS Performed at [facility], [address], [place], [place] [address]    Report Status PENDING  Incomplete  Blood culture (routine x 2)     Status: None (Preliminary result)   Collection Time: 07/19/22  4:18 PM   Specimen: BLOOD  Result Value Ref Range Status   Specimen Description   Final    BLOOD LEFT ANTECUBITAL Performed at [facility], [address] [place], [place] [address]    Special Requests   Final    BOTTLES DRAWN AEROBIC AND ANAEROBIC Blood Culture adequate volume Performed at [facility], [address], [place], [place] [address]    Culture  Final    NO GROWTH 2 DAYS Performed at [facility], [address], [place], [place] [address]    Report Status PENDING  Incomplete  SARS Coronavirus 2 by RT PCR (hospital order, performed in [facility] hospital lab) *cepheid single result test* Anterior Nasal Swab     Status: None   Collection Time: 07/19/22  4:19 PM   Specimen:  Anterior Nasal Swab  Result Value Ref Range Status   SARS Coronavirus 2 by RT PCR NEGATIVE NEGATIVE Final    Comment: (NOTE) SARS-CoV-2 target nucleic acids are NOT DETECTED.  The SARS-CoV-2 RNA is generally detectable in upper and lower respiratory specimens during the acute phase of infection. The lowest concentration of SARS-CoV-2 viral copies this assay can detect is 250 copies / mL. A negative result does not preclude SARS-CoV-2 infection and should not be used as the sole basis for treatment or other patient management decisions.  A negative result may occur with improper specimen collection / handling, submission of specimen other than nasopharyngeal swab, presence of viral mutation(s) within the areas targeted by this assay, and inadequate number of viral copies (<250 copies / mL). A negative result must be combined with clinical observations, patient history, and epidemiological information.  Fact Sheet for Patients:   RoadLapTop.co.za  Fact Sheet for Healthcare Providers: http://kim-miller.com/  This test is not yet approved or  cleared by the [place] FDA and has been authorized for detection and/or diagnosis of SARS-CoV-2 by FDA under an Emergency Use Authorization (EUA).  This EUA will remain in effect (meaning this test can be used) for the duration of the COVID-19 declaration under Section 564(b)(1) of the Act, 21 U.S.C. section 360bbb-3(b)(1), unless the authorization is terminated or revoked sooner.  Performed at [facility], [address] [place], [place] [address]     Antimicrobials: Anti-infectives (From admission, onward)    Start     Dose/Rate Route Frequency Ordered Stop   07/20/22 1900  azithromycin (ZITHROMAX) 500 mg in sodium chloride 0.9 % 250 mL IVPB        500 mg 250 mL/hr over 60 Minutes Intravenous Every 24 hours 07/20/22 1208 07/24/22 1859   07/20/22 1300  cefTRIAXone  (ROCEPHIN) 2 g in sodium chloride 0.9 % 100 mL IVPB        2 g 200 mL/hr over 30 Minutes Intravenous Every 24 hours 07/20/22 1208 07/25/22 1259   07/19/22 1845  cefTRIAXone (ROCEPHIN) 1 g in sodium chloride 0.9 % 100 mL IVPB        1 g 200 mL/hr over 30 Minutes Intravenous  Once 07/19/22 1837 07/19/22 2036   07/19/22 1845  azithromycin (ZITHROMAX) 500 mg in sodium chloride 0.9 % 250 mL IVPB        500 mg 250 mL/hr over 60 Minutes Intravenous  Once 07/19/22 1837 07/19/22 2036      Culture/Microbiology    Component Value Date/Time   SDES  07/19/2022 1618    BLOOD LEFT ANTECUBITAL Performed at [facility], [address], [place], [place] [address]    [facility]  07/19/2022 1618    BOTTLES DRAWN AEROBIC AND ANAEROBIC Blood Culture adequate volume Performed at [facility], [address] [place], [place] [address]    CULT  07/19/2022 1618    NO GROWTH 2 DAYS Performed at [facility], [address] [place], [place] [address]    REPTSTATUS PENDING 07/19/2022 1618  Radiology Studies: [facility] Chest Portable 1 View  Result Date: 07/19/2022 CLINICAL DATA:  Shortness of breath, altered mental status. EXAM: PORTABLE CHEST 1 VIEW COMPARISON:  Chest radiograph 06/27/2021 FINDINGS: The cardiomediastinal silhouette is normal. Coarsened interstitial markings in both lungs are unchanged, chronic. There is hazy opacity in the right lower lobe and a probable small right pleural effusion. There is no other focal airspace opacity. There is no significant left effusion. There is no pneumothorax There is no acute osseous abnormality. IMPRESSION: Hazy right lower lobe opacity may reflect right lower lobe pneumonia and small parapneumonic effusion in the correct clinical setting. Electronically Signed   By: Lesia Hausen M.D.   On: 07/19/2022 17:26     LOS: 1 day   Lanae Boast, MD [facility]  07/21/2022, 1:56 PM

## 2022-07-21 NOTE — Progress Notes (Signed)
SLP Cancellation Note  Patient Details Name: Gerald Valdez MRN: [id number] DOB: August 04, 1928   Cancelled treatment:       Reason Eval/Treat Not Completed: Medical issues which prohibited therapy;Other (comment) (MD and RN in room, patient's HR 140 and focus is getting his HR down; SLP will return this date)   Angela Nevin, MA, CCC-SLP Speech Therapy

## 2022-07-21 NOTE — Hospital Course (Addendum)
89 year old man with history significant of bladder cancer, chronic respiratory failure with hypoxia, COPD, HTN, GERD, HLD, h/o anemia, CKD 3a, malnutrition  with UTI ( since last Wednesday, on antibiotics) presented with somnolence and confusion, likely due to his wife's stopping his antibiotics due to concern of drug effect. On last Friday more sleepy and on Sunday could no wake him up. In the ED found to have severe hypercapnia with pCO2 101, chest x-ray possible  RLL pneumonia w/ small parapneumonic effusion, creatinine elevated 1.9 stable above baseline of 1.3 BNP 644 troponin 42, WBC 10.4 and he has a macrocytic anemia, H/H 10 and 34. TSH was 2.145 , COVID-negative EKG showed a flutter with PVC.  Patient was placed on BiPAP, further is mentation improving.  Patient admitted. Patient went into a flutter with RVR with minimal activity cardiology was consulted> starting on Eliquis with plan for TEE/DCCV if difficulty rate control.  Also started on IV Lasix for acute CHF> patient also needed BiPAP during bedtime seen by pulmonary. Now transition to oral Lasix.  Seen by palliative care changed to DNR/DNI. Patient will need home BiPAP given chronic hypercapnia and has been ordered, will need to follow-up with pulmonary to adjust At this time.  Stable for discharge awaiting for skilled nursing facility placement

## 2022-07-22 ENCOUNTER — Encounter: Payer: Self-pay | Admitting: *Deleted

## 2022-07-22 ENCOUNTER — Other Ambulatory Visit (HOSPITAL_COMMUNITY): Payer: Self-pay

## 2022-07-22 DIAGNOSIS — I5021 Acute systolic (congestive) heart failure: Secondary | ICD-10-CM

## 2022-07-22 DIAGNOSIS — I4892 Unspecified atrial flutter: Secondary | ICD-10-CM | POA: Diagnosis not present

## 2022-07-22 DIAGNOSIS — J9622 Acute and chronic respiratory failure with hypercapnia: Secondary | ICD-10-CM | POA: Diagnosis not present

## 2022-07-22 DIAGNOSIS — J9621 Acute and chronic respiratory failure with hypoxia: Secondary | ICD-10-CM | POA: Diagnosis not present

## 2022-07-22 LAB — CBC
HCT: 34.9 % — ABNORMAL LOW (ref 39.0–52.0)
Hemoglobin: 10.8 g/dL — ABNORMAL LOW (ref 13.0–17.0)
MCH: 29.9 pg (ref 26.0–34.0)
MCHC: 30.9 g/dL (ref 30.0–36.0)
MCV: 96.7 fL (ref 80.0–100.0)
Platelets: 214 10*3/uL (ref 150–400)
RBC: 3.61 MIL/uL — ABNORMAL LOW (ref 4.22–5.81)
RDW: 13.4 % (ref 11.5–15.5)
WBC: 14.5 10*3/uL — ABNORMAL HIGH (ref 4.0–10.5)
nRBC: 0 % (ref 0.0–0.2)

## 2022-07-22 LAB — BLOOD GAS, ARTERIAL
Acid-Base Excess: 21 mmol/L — ABNORMAL HIGH (ref 0.0–2.0)
Bicarbonate: 49.1 mmol/L — ABNORMAL HIGH (ref 20.0–28.0)
O2 Saturation: 97.7 %
Patient temperature: 37
pCO2 arterial: 69 mmHg (ref 32–48)
pH, Arterial: 7.46 — ABNORMAL HIGH (ref 7.35–7.45)
pO2, Arterial: 85 mmHg (ref 83–108)

## 2022-07-22 LAB — BASIC METABOLIC PANEL
Anion gap: 12 (ref 5–15)
Anion gap: 12 (ref 5–15)
BUN: 30 mg/dL — ABNORMAL HIGH (ref 8–23)
BUN: 26 mg/dL — ABNORMAL HIGH (ref 8–23)
CO2: 37 mmol/L — ABNORMAL HIGH (ref 22–32)
CO2: 40 mmol/L — ABNORMAL HIGH (ref 22–32)
Calcium: 8.6 mg/dL — ABNORMAL LOW (ref 8.9–10.3)
Calcium: 8.4 mg/dL — ABNORMAL LOW (ref 8.9–10.3)
Chloride: 88 mmol/L — ABNORMAL LOW (ref 98–111)
Chloride: 87 mmol/L — ABNORMAL LOW (ref 98–111)
Creatinine, Ser: 1.71 mg/dL — ABNORMAL HIGH (ref 0.61–1.24)
Creatinine, Ser: 1.78 mg/dL — ABNORMAL HIGH (ref 0.61–1.24)
GFR, Estimated: 37 mL/min — ABNORMAL LOW (ref >60)
GFR, Estimated: 36 mL/min — ABNORMAL LOW (ref >60)
Glucose, Bld: 115 mg/dL — ABNORMAL HIGH (ref 70–99)
Glucose, Bld: 138 mg/dL — ABNORMAL HIGH (ref 70–99)
Potassium: 3.3 mmol/L — ABNORMAL LOW (ref 3.5–5.1)
Potassium: 3.5 mmol/L (ref 3.5–5.1)
Sodium: 137 mmol/L (ref 135–145)
Sodium: 139 mmol/L (ref 135–145)

## 2022-07-22 LAB — MAGNESIUM: Magnesium: 2.2 mg/dL (ref 1.7–2.4)

## 2022-07-22 MED ORDER — POTASSIUM CHLORIDE CRYS ER 20 MEQ PO TBCR
40.0000 meq | EXTENDED_RELEASE_TABLET | Freq: Two times a day (BID) | ORAL | Status: DC
  Administered 2022-07-22: 40 meq via ORAL

## 2022-07-22 MED ORDER — ACETAZOLAMIDE SODIUM 500 MG IJ SOLR
500.0000 mg | Freq: Four times a day (QID) | INTRAMUSCULAR | Status: AC
  Administered 2022-07-22 – 2022-07-23 (×4): 500 mg via INTRAVENOUS
  Filled 2022-07-22 (×5): qty 500

## 2022-07-22 MED ORDER — FUROSEMIDE 10 MG/ML IJ SOLN
40.0000 mg | Freq: Every day | INTRAMUSCULAR | Status: DC
  Administered 2022-07-22 – 2022-07-23 (×2): 40 mg via INTRAVENOUS
  Filled 2022-07-22 (×3): qty 4

## 2022-07-22 NOTE — Progress Notes (Signed)
ABG collected and sent to lab. Lab called and notified.

## 2022-07-22 NOTE — Progress Notes (Addendum)
Rounding Note    Patient Name: Gerald Valdez Date of Encounter: 07/22/2022  [facility] Cardiologist: Kristeen Miss, MD   Subjective   No significant change from yesterday.  Still volume up, but seems to be more confused today.  Wife reports not sleeping very well last night.  Does have some baseline dementia, worsened when waking up from sleep.  Inpatient Medications    Scheduled Meds:  apixaban  2.5 mg Oral BID   escitalopram  10 mg Oral Daily   furosemide  40 mg Intravenous Daily   metoprolol tartrate  25 mg Oral BID   multivitamin with minerals  1 tablet Oral Daily   potassium chloride  40 mEq Oral BID   rosuvastatin  20 mg Oral QHS   sodium chloride flush  3 mL Intravenous Q12H   umeclidinium bromide  1 puff Inhalation Daily   Continuous Infusions:  azithromycin 500 mg (07/21/22 1813)   cefTRIAXone (ROCEPHIN)  IV Stopped (07/21/22 1259)   PRN Meds: ipratropium-albuterol, polyethylene glycol   Vital Signs    Vitals:   07/21/22 1954 07/22/22 0300 07/22/22 0720 07/22/22 0808  BP: 103/64 130/84 126/63   Pulse: 88  89   Resp: (!) 22 (!) 22 20   Temp: 97.7 F (36.5 C) 97.7 F (36.5 C) 97.6 F (36.4 C)   TempSrc: Oral Oral Oral   SpO2: 96% 95% 95% 99%  Weight:  66.2 kg    Height:        Intake/Output Summary (Last 24 hours) at 07/22/2022 0845 Last data filed at 07/22/2022 0300 Gross per 24 hour  Intake 79.39 ml  Output 1295 ml  Net -1215.61 ml      07/22/2022    3:00 AM 07/21/2022    3:55 AM 07/19/2022    3:58 PM  Last 3 Weights  Weight (lbs) 145 lb 15.1 oz 145 lb 14.4 oz 132 lb 11.5 oz  Weight (kg) 66.2 kg 66.18 kg 60.2 kg      Telemetry    Atrial flutter with variable rates.  Sustains heart rates around 90s to 100.  But will have runs where he is in the 140s.- Personally Reviewed  ECG    Atrial flutter, heart rate 101. - Personally Reviewed  Physical Exam   GEN: Confused, however appears to be somewhat near baseline per wife's  report..  Receiving supplemental oxygen.  3 L. Neck: +JVD Cardiac: , tachycardic irregular. Respiratory: Diminished breath sounds, GI: Soft, nontender, non-distended  MS: 1+ pitting edema. Neuro:  Nonfocal  Psych: Normal affect   Labs    High Sensitivity Troponin:   Recent Labs  Lab 07/19/22 1619 07/21/22 1001 07/21/22 1201  TROPONINIHS 42* 47* 47*     Chemistry Recent Labs  Lab  0000 07/19/22 1617 07/19/22 1619 07/19/22 1627 07/20/22 0450 07/20/22 1354 07/21/22 0648 07/22/22 0136  NA  --   --  136   < > 134*  --  136 137  K  --   --  4.3   < > 4.0  --  3.9 3.3*  CL  --   --  91*  --   --   --  86* 88*  CO2  --   --  38*  --   --   --  37* 37*  GLUCOSE  --   --  118*  --   --   --  85 115*  BUN  --   --  34*  --   --   --  31* 30*  CREATININE   < >  --  1.97*  --   --  1.92* 1.80* 1.71*  CALCIUM  --   --  9.2  --   --   --  8.6* 8.6*  MG  --   --   --   --   --   --   --  2.2  PROT  --  6.7  --   --   --   --   --   --   ALBUMIN  --  4.5  --   --   --   --   --   --   AST  --  37  --   --   --   --   --   --   ALT  --  72*  --   --   --   --   --   --   ALKPHOS  --  36*  --   --   --   --   --   --   BILITOT  --  0.4  --   --   --   --   --   --   GFRNONAA   < >  --  31*  --   --  32* 35* 37*  ANIONGAP  --   --  7  --   --   --  13 12   < > = values in this interval not displayed.    Lipids No results for input(s): "CHOL", "TRIG", "HDL", "LABVLDL", "LDLCALC", "CHOLHDL" in the last 168 hours.  Hematology Recent Labs  Lab 07/20/22 1354 07/21/22 0648 07/22/22 0136  WBC 12.8* 13.6* 14.5*  RBC 3.51* 3.32* 3.61*  HGB 10.6* 9.9* 10.8*  HCT 34.5* 32.6* 34.9*  MCV 98.3 98.2 96.7  MCH 30.2 29.8 29.9  MCHC 30.7 30.4 30.9  RDW 13.2 13.4 13.4  PLT 205 196 214   Thyroid  Recent Labs  Lab 07/19/22 1619  TSH 2.145    BNP Recent Labs  Lab 07/19/22 1619  BNP 644.6*    DDimer No results for input(s): "DDIMER" in the last 168 hours.   Radiology     ECHOCARDIOGRAM COMPLETE  Result Date: 07/21/2022    ECHOCARDIOGRAM REPORT   Patient Name:   Gerald Valdez Date of Exam: 07/21/2022 Medical Rec #:  [id number]       Height:       66.0 in Accession #:    [id number]      Weight:       145.9 lb Date of Birth:  February 19, 1928       BSA:          1.749 m Patient Age:    89 years        BP:           108/53 mmHg Patient Gender: M               HR:           97 bpm. Exam Location:  Inpatient Procedure: 2D Echo, Cardiac Doppler and Color Doppler Indications:    CHF-Acute Diastolic I50.31  History:        Patient has prior history of Echocardiogram examinations, most                 recent 02/11/2020. CKD 3 and COPD; Risk Factors:Hypertension,  Dyslipidemia and Former Smoker.  Sonographer:    Dondra Prader RVT RCS Referring Phys: [id number] Gerald Valdez  Sonographer Comments: Technically challenging study due to limited acoustic windows, Technically difficult study due to poor echo windows, suboptimal parasternal window, suboptimal apical window and suboptimal subcostal window. Image acquisition challenging due to respiratory motion, Image acquisition challenging due to patient body habitus and Image acquisition challenging due to uncooperative patient. IMPRESSIONS  1. Left ventricular ejection fraction, by estimation, is 45 to 50%. The left ventricle has mildly decreased function. The left ventricle has no regional wall motion abnormalities. There is mild left ventricular hypertrophy. Left ventricular diastolic parameters are indeterminate.  2. Right ventricular systolic function is normal. The right ventricular size is mildly enlarged.  3. Right atrial size was mildly dilated.  4. A small pericardial effusion is present. The pericardial effusion is anterior to the right ventricle. There is no evidence of cardiac tamponade.  5. The mitral valve is degenerative. Mild to moderate mitral valve regurgitation.  6. The aortic valve was not well visualized. Aortic valve  regurgitation is not visualized. Comparison(s): Prior images reviewed side by side. LVEF appears slightly decreased; this study is more technically difficult than prior. FINDINGS  Left Ventricle: LVOT calcification unchanged from prior. Left ventricular ejection fraction, by estimation, is 45 to 50%. The left ventricle has mildly decreased function. The left ventricle has no regional wall motion abnormalities. The left ventricular internal cavity size was normal in size. There is mild left ventricular hypertrophy. Left ventricular diastolic parameters are indeterminate. Right Ventricle: The right ventricular size is mildly enlarged. No increase in right ventricular wall thickness. Right ventricular systolic function is normal. Left Atrium: Left atrial size was normal in size. Right Atrium: Right atrial size was mildly dilated. Pericardium: A small pericardial effusion is present. The pericardial effusion is anterior to the right ventricle. There is no evidence of cardiac tamponade. Mitral Valve: The mitral valve is degenerative in appearance. Mild to moderate mitral valve regurgitation. Tricuspid Valve: The tricuspid valve is normal in structure. Tricuspid valve regurgitation is mild . No evidence of tricuspid stenosis. Aortic Valve: The aortic valve was not well visualized. Aortic valve regurgitation is not visualized. Aortic valve mean gradient measures 4.0 mmHg. Aortic valve peak gradient measures 6.7 mmHg. Aortic valve area, by VTI measures 1.77 cm. Pulmonic Valve: The pulmonic valve was not well visualized. Pulmonic valve regurgitation is not visualized. Aorta: The aortic root is normal in size and structure and the ascending aorta was not well visualized. IAS/Shunts: No atrial level shunt detected by color flow Doppler.  LEFT VENTRICLE PLAX 2D LVIDd:         4.20 cm   Diastology LVIDs:         3.30 cm   LV e' medial:    6.50 cm/s LV PW:         1.10 cm   LV E/e' medial:  10.7 LV IVS:        0.90 cm   LV e'  lateral:   8.18 cm/s LVOT diam:     1.90 cm   LV E/e' lateral: 8.5 LV SV:         38 LV SV Index:   22 LVOT Area:     2.84 cm  RIGHT VENTRICLE             IVC RV Basal diam:  3.90 cm     IVC diam: 2.50 cm RV S prime:     11.70 cm/s TAPSE (  M-mode): 1.7 cm LEFT ATRIUM           Index        RIGHT ATRIUM           Index LA diam:      3.90 cm 2.23 cm/m   RA Area:     18.20 cm LA Vol (A2C): 48.6 ml 27.79 ml/m  RA Volume:   56.90 ml  32.53 ml/m LA Vol (A4C): 37.5 ml 21.44 ml/m  AORTIC VALVE AV Area (Vmax):    1.62 cm AV Area (Vmean):   1.57 cm AV Area (VTI):     1.77 cm AV Vmax:           129.00 cm/s AV Vmean:          88.200 cm/s AV VTI:            0.216 m AV Peak Grad:      6.7 mmHg AV Mean Grad:      4.0 mmHg LVOT Vmax:         73.70 cm/s LVOT Vmean:        48.800 cm/s LVOT VTI:          0.135 m LVOT/AV VTI ratio: 0.62  AORTA Ao Root diam: 3.40 cm MITRAL VALVE               TRICUSPID VALVE MV Area (PHT): 4.67 cm    TR Peak grad:   30.2 mmHg MV Decel Time: 163 msec    TR Vmax:        275.00 cm/s MV E velocity: 69.85 cm/s MV A velocity: 61.70 cm/s  SHUNTS MV E/A ratio:  1.13        Systemic VTI:  0.14 m                            Systemic Diam: 1.90 cm Riley Lam MD Electronically signed by Riley Lam MD Signature Date/Time: 07/21/2022/5:42:19 PM    Final     Cardiac Studies   Echocardiogram 07/21/2022 1. Left ventricular ejection fraction, by estimation, is 45 to 50%. The  left ventricle has mildly decreased function. The left ventricle has no  regional wall motion abnormalities. There is mild left ventricular  hypertrophy. Left ventricular diastolic  parameters are indeterminate.   2. Right ventricular systolic function is normal. The right ventricular  size is mildly enlarged.   3. Right atrial size was mildly dilated.   4. A small pericardial effusion is present. The pericardial effusion is  anterior to the right ventricle. There is no evidence of cardiac  tamponade.   5.  The mitral valve is degenerative. Mild to moderate mitral valve  regurgitation.   6. The aortic valve was not well visualized. Aortic valve regurgitation  is not visualized.   Comparison(s): Prior images reviewed side by side. LVEF appears slightly  decreased; this study is more technically difficult than prior.   Patient Profile     89 y.o. male with a hx of coronary calcifications, hyperlipidemia, hypertension, COPD, bladder cancer, chronic respiratory failure with hypoxia, anemia, CKD stage IIIa, who was admitted 07/21/2022 for the evaluation of atrial flutter.  Upon presentation patient was severely hypercapnic and required BiPAP, now on nasal cannula 3 L.  Has underlying COPD.  Assessment & Plan    New onset atrial flutter with variable conduction   Patient presenting with somnolence/altered mental status and found to be severely hypercapnic with underlying COPD.  This is in the setting of recent urinary infection and pneumonia.  Heart rates still somewhat uncontrolled and are around 90-140 even after starting on lopressor 25mg  BID. Patient still hypervolemic on exam however does have some improvement in peripheral edema.  Still has positive JVD and 1-2+ pitting edema worse in the ankles. Echocardiogram shows LVEF of 45 to 50% with mild reduction when compared to 2022 study.  Likely related to RVR.  He had mildly dilated RA. On Lopressor 25 mg twice daily for rate control, somewhat controlled.  Continue Eliquis 2.5mg  BID.  Has history of duodenal ulcer.  No acute bleeding complications today.  Continue to closely monitor. TSH normal  Likely plan for outpatient DCCV once completes 3 weeks of anticoagulation, as rates have appeared recently controlled   Acute heart failure with moderately reduced EF Prior echocardiogram showing normal LVEF of 55 to 60% in 2022.  Echo on this admission shown 45 to 50%.  Likely tachycardia mediated. BNP 644 during this admission with signs of congestion as  noted above.  Also had small pericardial effusion with no tamponade physiology. Continue IV Lasix 40 mg daily, has had issues with hypokalemia after first dose, 3.2.  Will supplement with potassium 40 meq.  Creatinine has actually improved after diuresis.  Will continue IV diuretics with consideration of spironolactone once renal function stabilizes. Will recheck BMET later this afternoon  Will need follow up echo outpatient. Do not anticipate invasive ischemic eval given comorbidities.   Coronary calcifications noted on CT in 2022 Hyperlipidemia No anginal symptoms.  Continue to monitor.  Continue rosuvastatin 20 mg.     Demand ischemia  Has slightly elevated troponins that are flat at 47-47.  Likely due to demand ischemia.   Hypertension PTA amlodipine 5 mg daily.  Continue to hold given greater need for rate control and soft pressures.  Mild to moderate MR Continue to monitor, follow-up outpatient.   COPD Pneumonia Hypercapnia (improved) CKD stage IIIa difficult to measure baseline.  However creatinine appears to be around 1.8   For questions or updates, please contact [facility] Please consult [email or web address] for contact info under        Signed, Abagail Kitchens, PA-C  07/22/2022, 8:45 AM     Patient seen and examined.  Agree with above documentation.  On exam, patient is oriented to person only, regular rate and rhythm, no murmurs, diminished breath sounds, trace LE edema, + JVD.  Net -1.2 L yesterday but also with unmeasured UOP.  Creatinine stable at 1.7.  Telemetry shows remains in atrial flutter, primarily in 3-1 flutter with rate 90s.  Continue metoprolol for rate control.  Continue IV Lasix as remains volume overloaded on exam.  He is confused this morning, given prior hypercapnia discussed with Dr. Jonathon Bellows about checking ABG or VBG, may need to go back on BiPAP if having further hypercapnia  Little Ishikawa, MD

## 2022-07-22 NOTE — Consult Note (Signed)
NAME:  Gerald Valdez, MRN:  [id number], DOB:  [birth year], LOS: 2 ADMISSION DATE:  07/19/2022, CONSULTATION DATE:  07/22/22  REFERRING MD:  MD Dayna Barker CHIEF COMPLAINT:  somnolence and confusion   History of Present Illness:  Patient is a 89 yr old male with significant past medical history of HTN, CAD, CKD 3a, Anxiety, GERD, Bladder cancer, anemia, presumed COPD, malnutrition, and receiving treatment for UTI (since 6/5) who presented to [facility] ED with somnolence and confusion on 6/10. Somulence with confusion began on 6/7 and progressively worsened on 6/9 where patient was not waking up. Upon admission to the ED, patient was hypercarbic, chest x-ray showing possible pneumonia RLL with small parapneumonic effusion, BNP 644, troponin 42, EKG with atrial flutter. Patient still having hypercarbia with lethargia despite utilization of BIPAP. PCCM consulted to assist in managing hypercarbia.   Pertinent  Medical History   Past Medical History:  Diagnosis Date   Acute duodenal ulcer with bleeding 01/30/2014   Acute post-hemorrhagic anemia 01/30/2014   Anxiety    Aortic atherosclerosis (HCC) 02/10/2020   Arthritis    Basal cell carcinoma    Cancer (HCC)    bladder   Chronic kidney disease    COPD exacerbation (HCC) 02/10/2020   Coronary artery disease    Dyspnea    Dyspnea on exertion 02/10/2020   Emphysema of lung (HCC)    Essential hypertension 02/10/2020   GERD (gastroesophageal reflux disease) 02/10/2020   GI bleed 01/30/2014   Hyperlipidemia 02/10/2020     Significant Hospital Events: Including procedures, antibiotic start and stop dates in addition to other pertinent events   6/10 Admit with acute on chronic respiratory failure, respiratory acidosis, RLL pna  6/13 PCCM consulted for refractory hypercarbia   Interim History / Subjective:  Patient pleasantly confused, oriented to self and place only   Objective   Blood pressure 126/63, pulse 89, temperature 97.6 F (36.4 C),  temperature source Oral, resp. rate 20, height 5\' 6"  (1.676 m), weight 66.2 kg, SpO2 99 %.        Intake/Output Summary (Last 24 hours) at 07/22/2022 1252 Last data filed at 07/22/2022 0300 Gross per 24 hour  Intake 79.39 ml  Output 1295 ml  Net -1215.61 ml   Filed Weights   07/19/22 1558 07/21/22 0355 07/22/22 0300  Weight: 60.2 kg 66.2 kg 66.2 kg    Examination: General: chronically ill appearing older male, sitting up in hospital bed HENT: Normocephalic, Dry Mucous Membranes, [place] 2-3L Lungs: CTA but diminished throughout, no respiratory distress  Cardiovascular: s1, s2 auscultated, irregular RR, atrial flutter, no m/r/g Abdomen: BS active Extremities: no edema, moves extremities with ease  Neuro: Oriented x2, self and place, follows commands  GU: Intact   Resolved Hospital Problem list   N/a   Assessment & Plan:  Acute on chronic respiratory failure with hypoxia and hypercarbia  complicated by CHF (HFrEF) and RLL Pna  Presumed COPD hx quit smoking hx in 1994, 40 yr pack smoking hx  6/12 ECHO 40-50%, some decrease in LV function from prior studies No regional wall abnormalities, RV mildly elevated  ABG 6/13 7.46, pO2 85, Bicarb 49.1  Highly suspect refractory hypercarbia is due to loop diuretic administration  P: Add Diamox IV 500mg  q 6hr Continue BIPAP as needed  Continue cetriaxone and rocephin IV  Continue to follow up on BC  Continue Strict I/O, monitor urine output Will order IS Mobilize as tolerated, aggressive pulmonary hygiene  Continue Bronchodilators as tolerated   RLL PNA  P: Continue antibiotic coverage as above     Best Practice (right click and "Reselect all SmartList Selections" daily)   Diet/type: per primary DVT prophylaxis: per primary  GI prophylaxis: per primary  Lines: N/A Foley:  N/A Code Status:  full code Last date of multidisciplinary goals of care discussion [ updated wife at beside]  Review of Systems:   Please see the history  of present illness. All other systems reviewed and are negative    Past Medical History:  He,  has a past medical history of Acute duodenal ulcer with bleeding (01/30/2014), Acute post-hemorrhagic anemia (01/30/2014), Anxiety, Aortic atherosclerosis (HCC) (02/10/2020), Arthritis, Basal cell carcinoma, Cancer (HCC), Chronic kidney disease, COPD exacerbation (HCC) (02/10/2020), Coronary artery disease, Dyspnea, Dyspnea on exertion (02/10/2020), Emphysema of lung (HCC), Essential hypertension (02/10/2020), GERD (gastroesophageal reflux disease) (02/10/2020), GI bleed (01/30/2014), and Hyperlipidemia (02/10/2020).   Surgical History:   Past Surgical History:  Procedure Laterality Date   BLADDER SURGERY     CATARACT EXTRACTION W/ INTRAOCULAR LENS IMPLANT     CYSTOSCOPY W/ RETROGRADES Bilateral 11/20/2020   Procedure: CYSTOSCOPY WITH RETROGRADE PYELOGRAM;  Surgeon: Marcine Matar, MD;  Location: [facility];  Service: Urology;  Laterality: Bilateral;   CYSTOSCOPY W/ RETROGRADES Right 02/12/2021   Procedure: CYSTOSCOPY WITH RETROGRADE PYELOGRAM;  Surgeon: Marcine Matar, MD;  Location: [facility];  Service: Urology;  Laterality: Right;   CYSTOSCOPY W/ RETROGRADES Bilateral 06/25/2021   Procedure: CYSTOSCOPY WITH RETROGRADE PYELOGRAM;  Surgeon: Marcine Matar, MD;  Location: [facility];  Service: Urology;  Laterality: Bilateral;   ESOPHAGOGASTRODUODENOSCOPY N/A 01/30/2014   Procedure: ESOPHAGOGASTRODUODENOSCOPY (EGD);  Surgeon: Louis Meckel, MD;  Location: [facility];  Service: Endoscopy;  Laterality: N/A;   TONSILLECTOMY     TRANSURETHRAL RESECTION OF BLADDER TUMOR N/A 02/12/2021   Procedure: REPEAT TRANSURETHRAL RESECTION OF BLADDER TUMOR (TURBT);  Surgeon: Marcine Matar, MD;  Location: [facility];  Service: Urology;  Laterality: N/A;   TRANSURETHRAL RESECTION OF BLADDER TUMOR WITH MITOMYCIN-C N/A 11/20/2020   Procedure: TRANSURETHRAL RESECTION OF BLADDER TUMOR;  Surgeon: Marcine Matar,  MD;  Location: [facility];  Service: Urology;  Laterality: N/A;   TRANSURETHRAL RESECTION OF BLADDER TUMOR WITH MITOMYCIN-C N/A 06/25/2021   Procedure: TRANSURETHRAL RESECTION OF BLADDER TUMOR WITH GEMCITABINE;  Surgeon: Marcine Matar, MD;  Location: [facility];  Service: Urology;  Laterality: N/A;  1 HR   TRANSURETHRAL RESECTION OF BLADDER TUMOR WITH MITOMYCIN-C N/A 12/10/2021   Procedure: TRANSURETHRAL RESECTION OF BLADDER TUMOR WITH GEMCITABINE;  Surgeon: Marcine Matar, MD;  Location: [facility];  Service: Urology;  Laterality: N/A;  1 HR   TRANSURETHRAL RESECTION OF BLADDER TUMOR WITH MITOMYCIN-C N/A 05/27/2022   Procedure: TRANSURETHRAL RESECTION OF BLADDER TUMOR;  Surgeon: Marcine Matar, MD;  Location: [facility];  Service: Urology;  Laterality: N/A;     Social History:   reports that he quit smoking about 30 years ago. His smoking use included cigarettes. He started smoking about 77 years ago. He has never used smokeless tobacco. He reports that he does not currently use alcohol. He reports that he does not use drugs.   Family History:  His family history includes Stroke in his father.   Allergies No Known Allergies   Home Medications  Prior to Admission medications   Medication Sig Start Date End Date Taking? Authorizing Provider  amLODipine (NORVASC) 5 MG tablet Take 5 mg by mouth daily with breakfast.   Yes [provider]  ascorbic acid (VITAMIN C) 500 MG tablet Take 500 mg by mouth  daily.   Yes [provider]  escitalopram (LEXAPRO) 10 MG tablet Take 10 mg by mouth daily.   Yes [provider]  ferrous sulfate 325 (65 FE) MG tablet Take 325 mg by mouth daily with breakfast.   Yes [provider]  loratadine (CLARITIN) 10 MG tablet Take 10 mg by mouth daily.   Yes [provider]  Multiple Vitamin (MULTIVITAMIN WITH MINERALS) TABS tablet Take 1 tablet by mouth daily with breakfast.   Yes [provider]  OXYGEN Inhale 3 L into  the lungs continuous.   Yes [provider]  rosuvastatin (CRESTOR) 20 MG tablet Take 20 mg by mouth at bedtime. 08/15/20  Yes [provider]  saccharomyces boulardii (FLORASTOR) 250 MG capsule Take 1 capsule (250 mg total) by mouth 2 (two) times daily. Patient taking differently: Take 250 mg by mouth daily. 07/05/21  Yes Leatha Gilding, MD     Avis Epley Katrinka Blazing S-ACNP

## 2022-07-22 NOTE — Discharge Instructions (Signed)

## 2022-07-22 NOTE — Progress Notes (Signed)
PROGRESS NOTE Gerald Valdez  ZOX:[id number] DOB: 1928-10-03 DOA: 07/19/2022 PCP: [name], MD  Brief Narrative/Hospital Course: 89 year old man with history significant of bladder cancer, chronic respiratory failure with hypoxia, COPD, HTN, GERD, HLD, h/o anemia, CKD 3a, malnutrition  with UTI ( since last Wednesday, on antibiotics) presented with somnolence and confusion, likely due to his wife's stopping his antibiotics due to concern of drug effect. On last Friday more sleepy and on Sunday could no wake him up. In the ED found to have severe hypercapnia with pCO2 101, chest x-ray possible  RLL pneumonia w/ small parapneumonic effusion, creatinine elevated 1.9 stable above baseline of 1.3 BNP 644 troponin 42, WBC 10.4 and he has a macrocytic anemia, H/H 10 and 34. TSH was 2.145 , COVID-negative EKG showed a flutter with PVC.  Patient was placed on BiPAP, further is mentation improving.  Patient admitted. Patient went into a flutter with RVR with minimal activity cardiology was consulted> starting on Eliquis with plan for TEE/DCCV if difficulty rate control.  Also started on IV Lasix for acute CHF    Subjective: Patient was seen and examined Wife at the bedside With sleeping but able to wake up and talk Overnight afebrile, BP stable on 3 L [facility] with mild hypokalemia creatinine downtrending mild leukocytosis noted Returning from by cardiology that he was more confused and lethargic and ABG ordered  Assessment and Plan: Principal Problem:   Acute on chronic respiratory failure with hypoxia and hypercapnia (HCC) Active Problems:   COPD (chronic obstructive pulmonary disease) (HCC)   Essential hypertension   Hyperlipidemia   Cancer of overlapping sites of bladder (HCC)   AKI (acute kidney injury) (HCC)   Stage 3a chronic kidney disease (CKD) (HCC)   Protein-calorie malnutrition, moderate (HCC)   UTI (urinary tract infection)   Chronic respiratory failure with hypoxia (HCC)    Atrial flutter with rapid ventricular response (HCC)   Acute heart failure (HCC)   Acute on chronic respiratory failure with hypoxia and hypercapnia COPD RLL pneumonia with possible parapneumonic effusion: Patient needing BiPAP initially transitioned to nasal cannula on 3l- home setting, has mild leukocytosis, blood culture no growth so far.  Continue ceftriaxone/azithro, supplemental oxygen, cont ptot. He had smoked a pack per day for 40 years, abt 20 yrs ago, so likely has COPD- not seen by pulm, will need Pulm eval as some point, ?home bipap. Rechekc ABG today given worsening lethargy and confusion.  ABG showed CO2 retention at pCO2 69, requesting pulmonary to see  Acute metabolic encephalopathy due to hypercapnia: Mental status improved with BiPAP following commands appropriately continue PT OT, continue delirium precaution fall precaution, avoid psychotropic medication.  Checking ABG as above  Recent UTI, on antibiotics outpatient: UA unremarkable  Essential hypertension: BP stable, holding home amlodipine. Meds per cardio  Hyperlipidemia: cont Crestor  A flutter with variable AV block w/ RVR: HR in 130-140s while sitting on edge of the bed with PT, new onset a flutter cardio input appreciated echo shows EF 45-50% no RWMA small pericardial effusion is present anterior to the right ventricle mitral valve vegetation 55 to valve not well-visualized.  TSH stable.  Started on metoprolol twice daily  Acute CHF w.  Mildly reduced EF: Continue IV diuretics EF 45-50% on echo.  Appreciate cardiology input GDMT  per cardiology. Cont to monitor daily I/O,weight, electrolytes and net balance as below.Keep on  salt/fluid restricted diet and monitor in tele. Net IO Since Admission: -965.61 mL [07/22/22 0851]  Filed  Weights   07/19/22 1558 07/21/22 0355 07/22/22 0300  Weight: 60.2 kg 66.2 kg 66.2 kg    Urine retention overnight needing in and out cath: Monitor postvoid residual resideu, may need  foley catheter if recurrent.Add Flomax  Normocytic anemia: HB stable. was macrocytic, now normocytic.  Likely related to chronic issues.  No evidence of blood loss hemoglobin stable, avoid anticoagulation. Has known bladder cancer.  Recent Labs  Lab 07/19/22 1858 07/20/22 0450 07/20/22 1354 07/21/22 0648 07/22/22 0136  HGB 11.2* 11.9* 10.6* 9.9* 10.8*  HCT 33.0* 35.0* 34.5* 32.6* 34.9*   Cancer of overlapping sites of bladder:he is due for outpatient follow up this week - may need to be rescheduled   Hypokalemia: repleting while on diuretics.  AKI on CKD 3a: Likely in the setting of patient's infection, dehydration. In ED s/p lasix, bnp was high.  Creatinine downtrending monitor while on Lasix  Recent Labs    12/10/21 1155 05/14/22 0930 07/19/22 1619 07/20/22 1354 07/21/22 0648 07/22/22 0136  BUN 24* 31* 34*  --  31* 30*  CREATININE 1.33* 1.24 1.97* 1.92* 1.80* 1.71*  CO2 31 35* 38*  --  37* 37*     Inadequate oral intake, at risk of malnutrition  Nutrition Problem: Inadequate oral intake Etiology: inability to eat Signs/Symptoms: NPO status Interventions: Refer to RD note for recommendations, MVI, Ensure Enlive (each supplement provides 350kcal and 20 grams of protein)   Goals of care patient with advanced age and multiple medical comorbidities, he remains full code. DVT prophylaxis: apixaban (ELIQUIS) tablet 2.5 mg Start: 07/21/22 2200 Code Status:   Code Status: Full Code Family Communication: plan of care discussed with patient/wife at bedside. Patient status is: Inpatient because of hypercapnic respiratory failure  Level of care: Progressive   Dispo: The patient is from: Home with wife            Anticipated disposition: TBD Objective: Vitals last 24 hrs: Vitals:   07/21/22 1954 07/22/22 0300 07/22/22 0720 07/22/22 0808  BP: 103/64 130/84 126/63   Pulse: 88  89   Resp: (!) 22 (!) 22 20   Temp: 97.7 F (36.5 C) 97.7 F (36.5 C) 97.6 F (36.4 C)    TempSrc: Oral Oral Oral   SpO2: 96% 95% 95% 99%  Weight:  66.2 kg    Height:       Weight change: 0.02 kg  Physical Examination: General exam: AA, but sleepy elderly weak,older appearing HEENT:Oral mucosa moist, Ear/Nose WNL grossly, dentition normal. Respiratory system: bilaterally crackles at bases  Cardiovascular system: S1 & S2 +, regular rate. Gastrointestinal system: Abdomen soft, NT,ND,BS+ Nervous System:Alert, awake, moving extremities and grossly nonfocal Extremities: LE ankle edema +, lower extremities warm Skin: No rashes,no icterus. MSK: Normal muscle bulk,tone, power   Medications reviewed:  Scheduled Meds:  apixaban  2.5 mg Oral BID   escitalopram  10 mg Oral Daily   furosemide  40 mg Intravenous Daily   metoprolol tartrate  25 mg Oral BID   multivitamin with minerals  1 tablet Oral Daily   potassium chloride  40 mEq Oral BID   rosuvastatin  20 mg Oral QHS   sodium chloride flush  3 mL Intravenous Q12H   umeclidinium bromide  1 puff Inhalation Daily   Continuous Infusions:  azithromycin 500 mg (07/21/22 1813)   cefTRIAXone (ROCEPHIN)  IV Stopped (07/21/22 1259)    Diet Order             Diet Heart Room service appropriate? Yes;  Fluid consistency: Thin  Diet effective now                  Intake/Output Summary (Last 24 hours) at 07/22/2022 0845 Last data filed at 07/22/2022 0300 Gross per 24 hour  Intake 79.39 ml  Output 1295 ml  Net -1215.61 ml    Net IO Since Admission: -965.61 mL [07/22/22 0845]  Wt Readings from Last 3 Encounters:  07/22/22 66.2 kg  05/27/22 60.2 kg  05/14/22 60.2 kg    Unresulted Labs (From admission, onward)     Start     Ordered   07/22/22 0500  Basic metabolic panel  Daily,   R     Question:  Specimen collection method  Answer:  Lab=Lab collect   07/21/22 0744   07/22/22 0500  CBC  Daily,   R     Question:  Specimen collection method  Answer:  Lab=Lab collect   07/21/22 0744          Data Reviewed: I have  personally reviewed following labs and imaging studies CBC: Recent Labs  Lab 07/19/22 1619 07/19/22 1627 07/19/22 1858 07/20/22 0450 07/20/22 1354 07/21/22 0648 07/22/22 0136  WBC 10.4  --   --   --  12.8* 13.6* 14.5*  NEUTROABS 7.9*  --   --   --   --   --   --   HGB 10.2*   < > 11.2* 11.9* 10.6* 9.9* 10.8*  HCT 34.6*   < > 33.0* 35.0* 34.5* 32.6* 34.9*  MCV 100.9*  --   --   --  98.3 98.2 96.7  PLT 195  --   --   --  205 196 214   < > = values in this interval not displayed.    Basic Metabolic Panel: Recent Labs  Lab 07/19/22 1619 07/19/22 1627 07/19/22 1858 07/20/22 0450 07/20/22 1354 07/21/22 0648 07/22/22 0136  NA 136 132* 132* 134*  --  136 137  K 4.3 4.3 4.3 4.0  --  3.9 3.3*  CL 91*  --   --   --   --  86* 88*  CO2 38*  --   --   --   --  37* 37*  GLUCOSE 118*  --   --   --   --  85 115*  BUN 34*  --   --   --   --  31* 30*  CREATININE 1.97*  --   --   --  1.92* 1.80* 1.71*  CALCIUM 9.2  --   --   --   --  8.6* 8.6*  MG  --   --   --   --   --   --  2.2   GFR: Estimated Creatinine Clearance: 25.4 mL/min (A) (by C-G formula based on SCr of 1.71 mg/dL (H)). Liver Function Tests: Recent Labs  Lab 07/19/22 1617  AST 37  ALT 72*  ALKPHOS 36*  BILITOT 0.4  PROT 6.7  ALBUMIN 4.5   No results for input(s): "LIPASE", "AMYLASE" in the last 168 hours. Recent Labs  Lab 07/19/22 1619  AMMONIA 28    Recent Labs    07/19/22 1619  TSH 2.145   Sepsis Labs: Recent Labs  Lab 07/19/22 1619  LATICACIDVEN 0.8     Recent Results (from the past 240 hour(s))  Blood culture (routine x 2)     Status: None (Preliminary result)   Collection Time: 07/19/22  4:15 PM   Specimen:  BLOOD  Result Value Ref Range Status   Specimen Description   Final    BLOOD RIGHT ANTECUBITAL Performed at [facility], [address] [place], [place] [address]    Special Requests   Final    BOTTLES DRAWN AEROBIC AND ANAEROBIC Blood Culture adequate  volume Performed at [facility], [address], [place], [place] [address]    Culture   Final    NO GROWTH 3 DAYS Performed at [facility], [address] [place], [place] [address]    Report Status PENDING  Incomplete  Blood culture (routine x 2)     Status: None (Preliminary result)   Collection Time: 07/19/22  4:18 PM   Specimen: BLOOD  Result Value Ref Range Status   Specimen Description   Final    BLOOD LEFT ANTECUBITAL Performed at [facility], [address] [place], [place] [address]    Special Requests   Final    BOTTLES DRAWN AEROBIC AND ANAEROBIC Blood Culture adequate volume Performed at [facility], [address] [place], [place] [address]    Culture   Final    NO GROWTH 3 DAYS Performed at [facility], [address] [place], [place] [address]    Report Status PENDING  Incomplete  SARS Coronavirus 2 by RT PCR (hospital order, performed in [facility] hospital lab) *cepheid single result test* Anterior Nasal Swab     Status: None   Collection Time: 07/19/22  4:19 PM   Specimen: Anterior Nasal Swab  Result Value Ref Range Status   SARS Coronavirus 2 by RT PCR NEGATIVE NEGATIVE Final    Comment: (NOTE) SARS-CoV-2 target nucleic acids are NOT DETECTED.  The SARS-CoV-2 RNA is generally detectable in upper and lower respiratory specimens during the acute phase of infection. The lowest concentration of SARS-CoV-2 viral copies this assay can detect is 250 copies / mL. A negative result does not preclude SARS-CoV-2 infection and should not be used as the sole basis for treatment or other patient management decisions.  A negative result may occur with improper specimen collection / handling, submission of specimen other than nasopharyngeal swab, presence of viral mutation(s) within the areas targeted by this assay, and inadequate number of viral copies (<250 copies / mL). A negative result  must be combined with clinical observations, patient history, and epidemiological information.  Fact Sheet for Patients:   RoadLapTop.co.za  Fact Sheet for Healthcare Providers: http://kim-miller.com/  This test is not yet approved or  cleared by the [place] FDA and has been authorized for detection and/or diagnosis of SARS-CoV-2 by FDA under an Emergency Use Authorization (EUA).  This EUA will remain in effect (meaning this test can be used) for the duration of the COVID-19 declaration under Section 564(b)(1) of the Act, 21 U.S.C. section 360bbb-3(b)(1), unless the authorization is terminated or revoked sooner.  Performed at [facility], [address], [place], [place] [address]     Antimicrobials: Anti-infectives (From admission, onward)    Start     Dose/Rate Route Frequency Ordered Stop   07/20/22 1900  azithromycin (ZITHROMAX) 500 mg in sodium chloride 0.9 % 250 mL IVPB        500 mg 250 mL/hr over 60 Minutes Intravenous Every 24 hours 07/20/22 1208 07/24/22 1759   07/20/22 1300  cefTRIAXone (ROCEPHIN) 2 g in sodium chloride 0.9 % 100 mL IVPB        2 g 200 mL/hr over 30 Minutes  Intravenous Every 24 hours 07/20/22 1208 07/25/22 0959   07/19/22 1845  cefTRIAXone (ROCEPHIN) 1 g in sodium chloride 0.9 % 100 mL IVPB        1 g 200 mL/hr over 30 Minutes Intravenous  Once 07/19/22 1837 07/19/22 2036   07/19/22 1845  azithromycin (ZITHROMAX) 500 mg in sodium chloride 0.9 % 250 mL IVPB        500 mg 250 mL/hr over 60 Minutes Intravenous  Once 07/19/22 1837 07/19/22 2036      Culture/Microbiology    Component Value Date/Time   SDES  07/19/2022 1618    BLOOD LEFT ANTECUBITAL Performed at [facility], [address] [place], [place] [address]    [facility]  07/19/2022 1618    BOTTLES DRAWN AEROBIC AND ANAEROBIC Blood Culture adequate volume Performed at [facility], [address], [place], [place] [address]    CULT  07/19/2022 1618    NO GROWTH 3 DAYS Performed at [facility], [address] [place], [place] [address]    REPTSTATUS PENDING 07/19/2022 1618  Radiology Studies: ECHOCARDIOGRAM COMPLETE  Result Date: 07/21/2022    ECHOCARDIOGRAM REPORT   Patient Name:   ASSER DAIL Date of Exam: 07/21/2022 Medical Rec #:  [id number]       Height:       66.0 in Accession #:    [id number]      Weight:       145.9 lb Date of Birth:  11/16/1928       BSA:          1.749 m Patient Age:    89 years        BP:           108/53 mmHg Patient Gender: M               HR:           97 bpm. Exam Location:  Inpatient Procedure: 2D Echo, Cardiac Doppler and Color Doppler Indications:    CHF-Acute Diastolic I50.31  History:        Patient has prior history of Echocardiogram examinations, most                 recent 02/11/2020. CKD 3 and COPD; Risk Factors:Hypertension,                 Dyslipidemia and Former Smoker.  Sonographer:    Dondra Prader RVT RCS Referring Phys: [id number] EMILY B MULLEN  Sonographer Comments: Technically challenging study due to limited acoustic windows, Technically difficult study due to poor echo windows, suboptimal parasternal window, suboptimal apical window and suboptimal subcostal window. Image acquisition challenging due to respiratory motion, Image acquisition challenging due to patient body habitus and Image acquisition challenging due to uncooperative patient. IMPRESSIONS  1. Left ventricular ejection fraction, by estimation, is 45 to 50%. The left ventricle has mildly decreased function. The left ventricle has no regional wall motion abnormalities. There is mild left ventricular hypertrophy. Left ventricular diastolic parameters are indeterminate.  2. Right ventricular systolic function is normal. The right ventricular size is mildly enlarged.  3. Right atrial size was mildly dilated.  4. A small pericardial effusion is present. The  pericardial effusion is anterior to the right ventricle. There is no evidence of cardiac tamponade.  5. The mitral valve is degenerative. Mild to moderate mitral valve regurgitation.  6. The aortic valve was not well visualized. Aortic valve regurgitation is not visualized. Comparison(s): Prior images  reviewed side by side. LVEF appears slightly decreased; this study is more technically difficult than prior. FINDINGS  Left Ventricle: LVOT calcification unchanged from prior. Left ventricular ejection fraction, by estimation, is 45 to 50%. The left ventricle has mildly decreased function. The left ventricle has no regional wall motion abnormalities. The left ventricular internal cavity size was normal in size. There is mild left ventricular hypertrophy. Left ventricular diastolic parameters are indeterminate. Right Ventricle: The right ventricular size is mildly enlarged. No increase in right ventricular wall thickness. Right ventricular systolic function is normal. Left Atrium: Left atrial size was normal in size. Right Atrium: Right atrial size was mildly dilated. Pericardium: A small pericardial effusion is present. The pericardial effusion is anterior to the right ventricle. There is no evidence of cardiac tamponade. Mitral Valve: The mitral valve is degenerative in appearance. Mild to moderate mitral valve regurgitation. Tricuspid Valve: The tricuspid valve is normal in structure. Tricuspid valve regurgitation is mild . No evidence of tricuspid stenosis. Aortic Valve: The aortic valve was not well visualized. Aortic valve regurgitation is not visualized. Aortic valve mean gradient measures 4.0 mmHg. Aortic valve peak gradient measures 6.7 mmHg. Aortic valve area, by VTI measures 1.77 cm. Pulmonic Valve: The pulmonic valve was not well visualized. Pulmonic valve regurgitation is not visualized. Aorta: The aortic root is normal in size and structure and the ascending aorta was not well visualized. IAS/Shunts: No  atrial level shunt detected by color flow Doppler.  LEFT VENTRICLE PLAX 2D LVIDd:         4.20 cm   Diastology LVIDs:         3.30 cm   LV e' medial:    6.50 cm/s LV PW:         1.10 cm   LV E/e' medial:  10.7 LV IVS:        0.90 cm   LV e' lateral:   8.18 cm/s LVOT diam:     1.90 cm   LV E/e' lateral: 8.5 LV SV:         38 LV SV Index:   22 LVOT Area:     2.84 cm  RIGHT VENTRICLE             IVC RV Basal diam:  3.90 cm     IVC diam: 2.50 cm RV S prime:     11.70 cm/s TAPSE (M-mode): 1.7 cm LEFT ATRIUM           Index        RIGHT ATRIUM           Index LA diam:      3.90 cm 2.23 cm/m   RA Area:     18.20 cm LA Vol (A2C): 48.6 ml 27.79 ml/m  RA Volume:   56.90 ml  32.53 ml/m LA Vol (A4C): 37.5 ml 21.44 ml/m  AORTIC VALVE AV Area (Vmax):    1.62 cm AV Area (Vmean):   1.57 cm AV Area (VTI):     1.77 cm AV Vmax:           129.00 cm/s AV Vmean:          88.200 cm/s AV VTI:            0.216 m AV Peak Grad:      6.7 mmHg AV Mean Grad:      4.0 mmHg LVOT Vmax:         73.70 cm/s LVOT Vmean:  48.800 cm/s LVOT VTI:          0.135 m LVOT/AV VTI ratio: 0.62  AORTA Ao Root diam: 3.40 cm MITRAL VALVE               TRICUSPID VALVE MV Area (PHT): 4.67 cm    TR Peak grad:   30.2 mmHg MV Decel Time: 163 msec    TR Vmax:        275.00 cm/s MV E velocity: 69.85 cm/s MV A velocity: 61.70 cm/s  SHUNTS MV E/A ratio:  1.13        Systemic VTI:  0.14 m                            Systemic Diam: 1.90 cm Riley Lam MD Electronically signed by Riley Lam MD Signature Date/Time: 07/21/2022/5:42:19 PM    Final      LOS: 2 days   Lanae Boast, MD [facility]  07/22/2022, 8:45 AM

## 2022-07-22 NOTE — TOC Initial Note (Signed)
Transition of Care [facility]) - Initial/Assessment Note    Patient Details  Name: Gerald Valdez MRN: [id number] Date of Birth: 07/01/1928  Transition of Care [facility]) CM/SW Contact:    Delilah Shan, LCSWA Phone Number: 07/22/2022, 11:40 AM  Clinical Narrative:                  CSW received consult for possible SNF placement at time of discharge. Due to patients current orientation CSW spoke with patients spouse regarding PT recommendation of SNF placement at time of discharge.  Patients spouse expressed understanding of PT recommendation and request for CSW to speak with daughter Glendon Axe regarding PT recommendations/patients dc plan.CSW spoke with patients daughter Glendon Axe. Luanne expressed understanding of PT recommendations and politely declined SNF placement for patient at time of dc.Luanne reports PTA patient comes from home with spouse. Glendon Axe is interested in [facility] services for patient at dc. Luanne reports patient will have 24/7 support at home. CSW informed Luanne CM will follow up in regards to home needs for patient. Luanne thanked CSW. All questions answered. No further questions reported at this time. CSW informed CM. TOC will continue to follow.   Expected Discharge Plan: Home w Home Health Services Barriers to Discharge: Continued Medical Work up   Patient Goals and CMS Choice     Choice offered to / list presented to : Spouse, Adult Children      Expected Discharge Plan and Services In-house Referral: Clinical Social Work Discharge Planning Services: CM Consult Post Acute Care Choice: NA Living arrangements for the past 2 months: Single Family Home                   DME Agency: NA                  Prior Living Arrangements/Services Living arrangements for the past 2 months: Single Family Home Lives with:: Spouse Patient language and need for interpreter reviewed:: Yes Do you feel safe going back to the place where you live?: Yes      Need for Family Participation  in Patient Care: Yes (Comment) Care giver support system in place?: Yes (comment) Current home services: DME Criminal Activity/Legal Involvement Pertinent to Current Situation/Hospitalization: No - Comment as needed  Activities of Daily Living Home Assistive Devices/Equipment: None ADL Screening (condition at time of admission) Patient's cognitive ability adequate to safely complete daily activities?: No Is the patient deaf or have difficulty hearing?: Yes Does the patient have difficulty seeing, even when wearing glasses/contacts?: No Does the patient have difficulty concentrating, remembering, or making decisions?: Yes Patient able to express need for assistance with ADLs?: No Does the patient have difficulty dressing or bathing?: Yes Independently performs ADLs?: No Communication: Independent Dressing (OT): Needs assistance Is this a change from baseline?: Pre-admission baseline Grooming: Needs assistance Is this a change from baseline?: Pre-admission baseline Feeding: Independent Bathing: Needs assistance Is this a change from baseline?: Pre-admission baseline Toileting: Needs assistance Is this a change from baseline?: Pre-admission baseline In/Out Bed: Needs assistance Is this a change from baseline?: Pre-admission baseline Walks in Home: Needs assistance Is this a change from baseline?: Pre-admission baseline Does the patient have difficulty walking or climbing stairs?: Yes Weakness of Legs: Both Weakness of Arms/Hands: None  Permission Sought/Granted Permission sought to share information with : Case Manager, Family Supports, Magazine features editor Permission granted to share information with : No  Share Information with NAME: DUe to patients current orientation CSW spoke with spouse and patients daughter  Permission granted to share info w Relationship: spouse and daughter  Permission granted to share info w Contact Information: Corrie Dandy [phone] Glendon Axe  [phone]  Emotional Assessment Appearance:: Appears stated age Attitude/Demeanor/Rapport: Unable to Assess Affect (typically observed): Unable to Assess Orientation: : Oriented to Self, Oriented to Place, Oriented to  Time Alcohol / Substance Use: Not Applicable Psych Involvement: No (comment)  Admission diagnosis:  SOB (shortness of breath) [R06.02] Acute on chronic respiratory failure with hypoxia and hypercapnia (HCC) [J96.21, J96.22] Acute on chronic hypoxic respiratory failure (HCC) [J96.21] Patient Active Problem List   Diagnosis Date Noted   Atrial flutter with rapid ventricular response (HCC) 07/21/2022   Acute heart failure (HCC) 07/21/2022   Acute on chronic hypoxic respiratory failure (HCC) 07/20/2022   Acute on chronic respiratory failure with hypoxia and hypercapnia (HCC) 07/19/2022   Malignant neoplasm of bladder wall (HCC) 05/27/2022   Hypokalemia 07/04/2021   Thrombocytopenia (HCC) 07/04/2021   Iron deficiency anemia 07/04/2021   Protein-calorie malnutrition, severe 07/03/2021   Dehydration 07/01/2021   AKI (acute kidney injury) (HCC) 07/01/2021   Stage 3a chronic kidney disease (CKD) (HCC) 07/01/2021   Protein-calorie malnutrition, moderate (HCC) 07/01/2021   Diarrhea 07/01/2021   UTI (urinary tract infection) 07/01/2021   Sepsis (HCC) 07/01/2021   Chronic respiratory failure with hypoxia (HCC) 07/01/2021   Hyperglycemia 07/01/2021   Normocytic anemia 07/01/2021   Urinary retention 06/26/2021   Cancer of overlapping sites of bladder (HCC) 06/25/2021   Malignant neoplasm of overlapping sites of bladder (HCC) 11/20/2020   COPD (chronic obstructive pulmonary disease) (HCC) 02/10/2020   Aortic atherosclerosis (HCC) 02/10/2020   Dyspnea on exertion 02/10/2020   Essential hypertension 02/10/2020   Hyperlipidemia 02/10/2020   GERD (gastroesophageal reflux disease) 02/10/2020   GI bleed 01/30/2014   Acute post-hemorrhagic anemia 01/30/2014   Acute duodenal  ulcer with bleeding 01/30/2014   PCP:  [name], MD Pharmacy:   [facility] - [place], [place] - [address] [place] [address] Phone: [phone] Fax: [phone]     Social Determinants of Health (SDOH) Social History: SDOH Screenings   Food Insecurity: No Food Insecurity (07/22/2022)  Housing: Low Risk  (07/22/2022)  Transportation Needs: No Transportation Needs (07/22/2022)  Utilities: Not At Risk (07/22/2022)  Tobacco Use: Medium Risk (07/20/2022)   SDOH Interventions:     Readmission Risk Interventions     No data to display

## 2022-07-22 NOTE — TOC Progression Note (Signed)
Transition of Care [facility]) - Progression Note    Patient Details  Name: Gerald Valdez MRN: [id number] Date of Birth: [birth year]  Transition of Care [facility]) CM/SW Contact  Graves-Bigelow, Lamar Laundry, RN Phone Number: 07/22/2022, 12:17 PM  Clinical Narrative:  Case Manager spoke with patients daughter Gerald Valdez and the plan will be for transition home with home health services. Daughter states she will be staying with the family for the summer for additional support. Daughter did not have a preference for [facility]. Case Manager made the referral with [facility]. Services to start within 24-48 hours post transition home. No further home needs identified.   Expected Discharge Plan: Home w Home Health Services Barriers to Discharge: No Barriers Identified  Expected Discharge Plan and Services In-house Referral: Clinical Social Work Discharge Planning Services: CM Consult Post Acute Care Choice: NA Living arrangements for the past 2 months: Single Family Home                   DME Agency: NA       HH Arranged: RN, Disease Management, PT HH Agency: [facility] Date HH Agency Contacted: 07/22/22 Time HH Agency Contacted: 1215 Representative spoke with at [facility] Agency: Elnita Maxwell   Social Determinants of Health (SDOH) Interventions SDOH Screenings   Food Insecurity: No Food Insecurity (07/22/2022)  Housing: Low Risk  (07/22/2022)  Transportation Needs: No Transportation Needs (07/22/2022)  Utilities: Not At Risk (07/22/2022)  Tobacco Use: Medium Risk (07/20/2022)    Readmission Risk Interventions     No data to display

## 2022-07-22 NOTE — Progress Notes (Signed)
Patient c/o intolerable pain across his lower abdomen. Bladder scan done. See chart for details. Intermittent catheterization completed. 825 mL removed. Small blood clots present during catheterization.

## 2022-07-22 NOTE — Progress Notes (Signed)
Oncology Discharge Planning Note  [facility] Address: [address], [place], [place] [address] Hours of Operation:  Lewayne Bunting, Monday - Friday  Clinic Contact Information:  [phone]) [phone]  Oncology Care Team: Medical Oncologist:  Truett Perna  Patient Details: Name:  Gerald Valdez, Gerald Valdez MRN:   [id number] DOB:   10-Nov-1928 Reason for Current Admission: @PPROB @  Discharge Planning Narrative: Notification of admission received by inpatient team for [name].  Discharge follow-up appointments for oncology are current and available on the AVS and MyChart.   Upon discharge from the hospital, hematology/oncology's post discharge plan of care for the outpatient setting is:   Rescheduled appt  August 02, 2022 at 1:40 pm With Dr Truett Perna [facility] [address] [place], [place] [address]   [phone]   [name] will be called within two business days after discharge to review hematology/oncology's plan of care for full understanding.    Outpatient Oncology Specific Care Only: Oncology appointment transportation needs addressed?:  no Oncology medication management for symptom management addressed?:  no Chemo Alert Card reviewed?:  not applicable Immunotherapy Alert Card reviewed?:  not applicable

## 2022-07-22 NOTE — TOC Benefit Eligibility Note (Signed)
Pharmacy Patient Advocate Encounter  Insurance verification completed.    The patient is insured through [facility]     Ran test claim for Eliquis and the current 30 day co-pay is $45.00.   This test claim was processed through [facility]- copay amounts may vary at other pharmacies due to pharmacy/plan contracts, or as the patient moves through the different stages of their insurance plan.

## 2022-07-22 NOTE — Plan of Care (Signed)
  Problem: Education: Goal: Knowledge of General Education information will improve Description: Including pain rating scale, medication(s)/side effects and non-pharmacologic comfort measures Outcome: Progressing   Problem: Health Behavior/Discharge Planning: Goal: Ability to manage health-related needs will improve Outcome: Progressing   Problem: Clinical Measurements: Goal: Ability to maintain clinical measurements within normal limits will improve Outcome: Progressing Goal: Will remain free from infection Outcome: Progressing Goal: Diagnostic test results will improve Outcome: Progressing Goal: Respiratory complications will improve Outcome: Progressing Goal: Cardiovascular complication will be avoided Outcome: Progressing   Problem: Activity: Goal: Risk for activity intolerance will decrease Outcome: Progressing   Problem: Nutrition: Goal: Adequate nutrition will be maintained Outcome: Progressing   Problem: Coping: Goal: Level of anxiety will decrease Outcome: Progressing   Problem: Elimination: Goal: Will not experience complications related to bowel motility Outcome: Progressing Goal: Will not experience complications related to urinary retention Outcome: Progressing   Problem: Pain Managment: Goal: General experience of comfort will improve Outcome: Progressing   Problem: Safety: Goal: Ability to remain free from injury will improve Outcome: Progressing   Problem: Skin Integrity: Goal: Risk for impaired skin integrity will decrease Outcome: Progressing   Problem: Education: Goal: Ability to demonstrate management of disease process will improve Outcome: Progressing Goal: Ability to verbalize understanding of medication therapies will improve Outcome: Progressing Goal: Individualized Educational Video(s) Outcome: Progressing   Problem: Activity: Goal: Capacity to carry out activities will improve Outcome: Progressing   Problem: Cardiac: Goal:  Ability to achieve and maintain adequate cardiopulmonary perfusion will improve Outcome: Progressing   Problem: Activity: Goal: Ability to tolerate increased activity will improve Outcome: Progressing   Problem: Clinical Measurements: Goal: Ability to maintain a body temperature in the normal range will improve Outcome: Progressing   Problem: Respiratory: Goal: Ability to maintain adequate ventilation will improve Outcome: Progressing Goal: Ability to maintain a clear airway will improve Outcome: Progressing   

## 2022-07-23 ENCOUNTER — Inpatient Hospital Stay: Payer: Medicare Other | Admitting: Oncology

## 2022-07-23 DIAGNOSIS — J9621 Acute and chronic respiratory failure with hypoxia: Secondary | ICD-10-CM | POA: Diagnosis not present

## 2022-07-23 DIAGNOSIS — I5021 Acute systolic (congestive) heart failure: Secondary | ICD-10-CM | POA: Diagnosis not present

## 2022-07-23 DIAGNOSIS — I4892 Unspecified atrial flutter: Secondary | ICD-10-CM | POA: Diagnosis not present

## 2022-07-23 DIAGNOSIS — J9622 Acute and chronic respiratory failure with hypercapnia: Secondary | ICD-10-CM | POA: Diagnosis not present

## 2022-07-23 LAB — BLOOD GAS, ARTERIAL
Acid-Base Excess: 19.5 mmol/L — ABNORMAL HIGH (ref 0.0–2.0)
Bicarbonate: 47.4 mmol/L — ABNORMAL HIGH (ref 20.0–28.0)
Drawn by: 51133
O2 Saturation: 99.6 %
Patient temperature: 36.4
pCO2 arterial: 71 mmHg (ref 32–48)
pH, Arterial: 7.43 (ref 7.35–7.45)
pO2, Arterial: 97 mmHg (ref 83–108)

## 2022-07-23 LAB — CBC
HCT: 34.2 % — ABNORMAL LOW (ref 39.0–52.0)
Hemoglobin: 10.2 g/dL — ABNORMAL LOW (ref 13.0–17.0)
MCH: 30 pg (ref 26.0–34.0)
MCHC: 29.8 g/dL — ABNORMAL LOW (ref 30.0–36.0)
MCV: 100.6 fL — ABNORMAL HIGH (ref 80.0–100.0)
Platelets: 192 10*3/uL (ref 150–400)
RBC: 3.4 MIL/uL — ABNORMAL LOW (ref 4.22–5.81)
RDW: 13.6 % (ref 11.5–15.5)
WBC: 9.6 10*3/uL (ref 4.0–10.5)
nRBC: 0 % (ref 0.0–0.2)

## 2022-07-23 LAB — BASIC METABOLIC PANEL
Anion gap: 11 (ref 5–15)
BUN: 24 mg/dL — ABNORMAL HIGH (ref 8–23)
CO2: 38 mmol/L — ABNORMAL HIGH (ref 22–32)
Calcium: 8.5 mg/dL — ABNORMAL LOW (ref 8.9–10.3)
Chloride: 89 mmol/L — ABNORMAL LOW (ref 98–111)
Creatinine, Ser: 1.83 mg/dL — ABNORMAL HIGH (ref 0.61–1.24)
GFR, Estimated: 34 mL/min — ABNORMAL LOW (ref >60)
Glucose, Bld: 96 mg/dL (ref 70–99)
Potassium: 3.1 mmol/L — ABNORMAL LOW (ref 3.5–5.1)
Sodium: 138 mmol/L (ref 135–145)

## 2022-07-23 LAB — CULTURE, BLOOD (ROUTINE X 2): Culture: NO GROWTH

## 2022-07-23 MED ORDER — FUROSEMIDE 40 MG PO TABS
40.0000 mg | ORAL_TABLET | Freq: Every day | ORAL | Status: DC
  Administered 2022-07-24: 40 mg via ORAL
  Filled 2022-07-23: qty 1

## 2022-07-23 MED ORDER — TAMSULOSIN HCL 0.4 MG PO CAPS
0.4000 mg | ORAL_CAPSULE | Freq: Every day | ORAL | Status: DC
  Administered 2022-07-23 – 2022-07-27 (×5): 0.4 mg via ORAL
  Filled 2022-07-23 (×5): qty 1

## 2022-07-23 MED ORDER — POTASSIUM CHLORIDE 10 MEQ/100ML IV SOLN
10.0000 meq | INTRAVENOUS | Status: AC
  Administered 2022-07-23 (×4): 10 meq via INTRAVENOUS
  Filled 2022-07-23 (×4): qty 100

## 2022-07-23 MED ORDER — POTASSIUM CHLORIDE CRYS ER 20 MEQ PO TBCR
40.0000 meq | EXTENDED_RELEASE_TABLET | Freq: Once | ORAL | Status: AC
  Administered 2022-07-23: 40 meq via ORAL
  Filled 2022-07-23: qty 2

## 2022-07-23 MED ORDER — ATORVASTATIN CALCIUM 40 MG PO TABS
40.0000 mg | ORAL_TABLET | Freq: Every day | ORAL | Status: DC
  Administered 2022-07-23 – 2022-07-27 (×5): 40 mg via ORAL
  Filled 2022-07-23 (×5): qty 1

## 2022-07-23 MED ORDER — POTASSIUM CHLORIDE CRYS ER 20 MEQ PO TBCR
20.0000 meq | EXTENDED_RELEASE_TABLET | Freq: Once | ORAL | Status: AC
  Administered 2022-07-23: 20 meq via ORAL
  Filled 2022-07-23: qty 1

## 2022-07-23 NOTE — Progress Notes (Signed)
   07/23/22 2203  Assess: MEWS Score  Temp 97.6 F (36.4 C)  BP (!) 80/60  Pulse Rate 81  ECG Heart Rate 78  Resp 18  Level of Consciousness Alert  SpO2 100 %  O2 Device Nasal Cannula  Patient Activity (if Appropriate) In bed  O2 Flow Rate (L/min) 3 L/min  Assess: if the MEWS score is Yellow or Red  Were vital signs taken at a resting state? Yes  Focused Assessment No change from prior assessment  Does the patient meet 2 or more of the SIRS criteria? No  MEWS guidelines implemented  Yes, yellow  Treat  MEWS Interventions Considered administering scheduled or prn medications/treatments as ordered  Take Vital Signs  Increase Vital Sign Frequency  Yellow: Q2hr x1, continue Q4hrs until patient remains green for 12hrs  Escalate  MEWS: Escalate Yellow: Discuss with charge nurse and consider notifying provider and/or RRT  Notify: Charge Nurse/RN  Name of Charge Nurse/RN Notified [facility]  Provider Notification  Provider Name/Title Dr. Dow Adolph  Date Provider Notified 07/23/22  Time Provider Notified 2209  Method of Notification Page (secure chat)  Notification Reason Other (Comment) (SBP in 70's-80's)  Provider response No new orders  Date of Provider Response 07/23/22  Time of Provider Response 2227

## 2022-07-23 NOTE — Progress Notes (Signed)
ABG obtained lab made aware.

## 2022-07-23 NOTE — Progress Notes (Signed)
Physical Therapy Treatment Patient Details Name: Gerald Valdez MRN: [id number] DOB: January 18, 1929 Today's Date: 07/23/2022   History of Present Illness Patient is a 89 year old male who presented on 6/10 with increased somnolence and abdominal pain. Patient was admitted with early pneumonia, acute on chronic respiratory failure with hypoxia and hypercapnia, and possible a flutter. PMH: anxiety, bladder CA, GI bleed, COPD, emphysema of lung, CKD.    PT Comments    Pt today with similar presentation to previous session however limited by low BP. BP: 86/56 seated. BP: 96/57 seated after 3 mins. BP: 92/47 standing. BP: 95/52 supine after standing. Pt would still benefit from SNF however family has refused and stated that they have HHPT set up. Pt will continue to follow.  Recommendations for follow up therapy are one component of a multi-disciplinary discharge planning process, led by the attending physician.  Recommendations may be updated based on patient status, additional functional criteria and insurance authorization.  Follow Up Recommendations  Can patient physically be transported by private vehicle: No    Assistance Recommended at Discharge Frequent or constant Supervision/Assistance  Patient can return home with the following A lot of help with walking and/or transfers;A lot of help with bathing/dressing/bathroom;Assistance with cooking/housework;Direct supervision/assist for medications management;Assist for transportation;Help with stairs or ramp for entrance   Equipment Recommendations  Other (comment) (Per accepting facility)    Recommendations for Other Services       Precautions / Restrictions Precautions Precautions: Other (comment);Fall Precaution Comments: monitor HR Restrictions Weight Bearing Restrictions: No     Mobility  Bed Mobility Overal bed mobility: Needs Assistance Bed Mobility: Supine to Sit, Sit to Supine     Supine to sit: Min assist Sit to supine:  Min guard   General bed mobility comments: patient needed increased cues for transitioning to EOB with patient needed physical A for advancement of BLE out of bed.    Transfers Overall transfer level: Needs assistance Equipment used: Rolling walker (2 wheels) Transfers: Sit to/from Stand Sit to Stand: +2 physical assistance, Min assist           General transfer comment: +2 Min A for side steps along EOB. Cues for hand placement. Ambulation deferred due to BP.    Ambulation/Gait               General Gait Details: Deferred due to BP.   Stairs             Wheelchair Mobility    Modified Rankin (Stroke Patients Only)       Balance Overall balance assessment: Needs assistance Sitting-balance support: Single extremity supported, Feet supported Sitting balance-Leahy Scale: Fair     Standing balance support: Reliant on assistive device for balance, Bilateral upper extremity supported Standing balance-Leahy Scale: Poor Standing balance comment: Reliant on RW                            Cognition Arousal/Alertness: Lethargic Behavior During Therapy: WFL for tasks assessed/performed Overall Cognitive Status: Within Functional Limits for tasks assessed                                 General Comments: Pt was pretty lethargic today and required multiple verbal and tactile cues to stay awake. Pt is HOH at baseline and daughter/wife help get pt to respond.        Exercises General Exercises -  Lower Extremity Ankle Circles/Pumps: AROM, Both, 10 reps, Seated    General Comments General comments (skin integrity, edema, etc.): BP: 86/56 seated. BP: 96/57 seated after 3 mins. BP: 92/47 standing. BP: 95/52 supine after standing.      Pertinent Vitals/Pain Pain Assessment Pain Assessment: No/denies pain    Home Living                          Prior Function            PT Goals (current goals can now be found in the  care plan section) Progress towards PT goals: Progressing toward goals    Frequency    Min 1X/week      PT Plan Current plan remains appropriate    Co-evaluation              AM-PAC PT "6 Clicks" Mobility   Outcome Measure  Help needed turning from your back to your side while in a flat bed without using bedrails?: A Little Help needed moving from lying on your back to sitting on the side of a flat bed without using bedrails?: A Little Help needed moving to and from a bed to a chair (including a wheelchair)?: A Little Help needed standing up from a chair using your arms (e.g., wheelchair or bedside chair)?: A Lot Help needed to walk in hospital room?: A Lot Help needed climbing 3-5 steps with a railing? : Total 6 Click Score: 14    End of Session Equipment Utilized During Treatment: Gait belt Activity Tolerance: Treatment limited secondary to medical complications (Comment) (Limited by BP) Patient left: in bed;with call bell/phone within reach;with bed alarm set Nurse Communication: Mobility status;Other (comment) (BP) PT Visit Diagnosis: Other abnormalities of gait and mobility (R26.89);Unsteadiness on feet (R26.81);Muscle weakness (generalized) (M62.81);Difficulty in walking, not elsewhere classified (R26.2)     Time: 1610-9604 PT Time Calculation (min) (ACUTE ONLY): 25 min  Charges:  $Therapeutic Activity: 23-37 mins                     Shela Nevin, PT, DPT Acute Rehab Services [phone]    Gladys Damme 07/23/2022, 1:59 PM

## 2022-07-23 NOTE — Plan of Care (Signed)
  Problem: Education: Goal: Knowledge of General Education information will improve Description: Including pain rating scale, medication(s)/side effects and non-pharmacologic comfort measures Outcome: Progressing   Problem: Health Behavior/Discharge Planning: Goal: Ability to manage health-related needs will improve Outcome: Progressing   Problem: Clinical Measurements: Goal: Ability to maintain clinical measurements within normal limits will improve Outcome: Progressing Goal: Will remain free from infection Outcome: Progressing Goal: Diagnostic test results will improve Outcome: Progressing Goal: Respiratory complications will improve Outcome: Progressing Goal: Cardiovascular complication will be avoided Outcome: Progressing   Problem: Activity: Goal: Risk for activity intolerance will decrease Outcome: Progressing   Problem: Nutrition: Goal: Adequate nutrition will be maintained Outcome: Progressing   Problem: Coping: Goal: Level of anxiety will decrease Outcome: Progressing   Problem: Elimination: Goal: Will not experience complications related to bowel motility Outcome: Progressing Goal: Will not experience complications related to urinary retention Outcome: Progressing   Problem: Pain Managment: Goal: General experience of comfort will improve Outcome: Progressing   Problem: Safety: Goal: Ability to remain free from injury will improve Outcome: Progressing   Problem: Skin Integrity: Goal: Risk for impaired skin integrity will decrease Outcome: Progressing   Problem: Education: Goal: Ability to demonstrate management of disease process will improve Outcome: Progressing Goal: Ability to verbalize understanding of medication therapies will improve Outcome: Progressing Goal: Individualized Educational Video(s) Outcome: Progressing   Problem: Activity: Goal: Capacity to carry out activities will improve Outcome: Progressing   Problem: Cardiac: Goal:  Ability to achieve and maintain adequate cardiopulmonary perfusion will improve Outcome: Progressing   Problem: Activity: Goal: Ability to tolerate increased activity will improve Outcome: Progressing   Problem: Clinical Measurements: Goal: Ability to maintain a body temperature in the normal range will improve Outcome: Progressing   Problem: Respiratory: Goal: Ability to maintain adequate ventilation will improve Outcome: Progressing Goal: Ability to maintain a clear airway will improve Outcome: Progressing   

## 2022-07-23 NOTE — Care Management Important Message (Signed)
Important Message  Patient Details  Name: Gerald Valdez MRN: [id number] Date of Birth: Sep 24, 1928   Medicare Important Message Given:  Yes     Sherilyn Banker 07/23/2022, 4:33 PM

## 2022-07-23 NOTE — Progress Notes (Signed)
PROGRESS NOTE Gerald Valdez  ZOX:[id number] DOB: October 04, 1928 DOA: 07/19/2022 PCP: [name], MD  Brief Narrative/Hospital Course: 89 year old man with history significant of bladder cancer, chronic respiratory failure with hypoxia, COPD, HTN, GERD, HLD, h/o anemia, CKD 3a, malnutrition  with UTI ( since last Wednesday, on antibiotics) presented with somnolence and confusion, likely due to his wife's stopping his antibiotics due to concern of drug effect. On last Friday more sleepy and on Sunday could no wake him up. In the ED found to have severe hypercapnia with pCO2 101, chest x-ray possible  RLL pneumonia w/ small parapneumonic effusion, creatinine elevated 1.9 stable above baseline of 1.3 BNP 644 troponin 42, WBC 10.4 and he has a macrocytic anemia, H/H 10 and 34. TSH was 2.145 , COVID-negative EKG showed a flutter with PVC.  Patient was placed on BiPAP, further is mentation improving.  Patient admitted. Patient went into a flutter with RVR with minimal activity cardiology was consulted> starting on Eliquis with plan for TEE/DCCV if difficulty rate control.  Also started on IV Lasix for acute CHF   Subjective: Seen and examined Family at bedside Overnight afebrile Used bipap overnight> trying to wake up after which he was able to follow commands Labs w/ low potassium Abg showing 7.4/71/97  Assessment and Plan: Principal Problem:   Acute on chronic respiratory failure with hypoxia and hypercapnia (HCC) Active Problems:   COPD (chronic obstructive pulmonary disease) (HCC)   Essential hypertension   Hyperlipidemia   Cancer of overlapping sites of bladder (HCC)   AKI (acute kidney injury) (HCC)   Stage 3a chronic kidney disease (CKD) (HCC)   Protein-calorie malnutrition, moderate (HCC)   UTI (urinary tract infection)   Chronic respiratory failure with hypoxia (HCC)   Atrial flutter with rapid ventricular response (HCC)   Acute heart failure (HCC)   Acute on chronic respiratory  failure with hypoxia and hypercapnia Suspected COPD-long standing remote smoking history RLL pneumonia with possible parapneumonic effusion: Patient needing BiPAP initially transitioned to nasal cannula on 3l- home setting, has mild leukocytosis, blood culture no growth so far.  There is concern for right lower lobe pneumonia and placed on empiric ceftriaxone S/P Romycin.  6/13 became lethargic with ABG still showing hypercapnia PCCM consulted now on BiPAP bedtime and PRN, continue diuresis per CHF team, acetazolamide for metabolic alkalosis per PCCM.  Continue to remain on oxygen PT OT  Acute metabolic encephalopathy due to hypercapnia: Appears lethargic still but overall following commands well on BiPAP this morning.  Continue PT OT, supportive care delirium precaution.  Continue BiPAP nightly.    Recent UTI, on antibiotics outpatient UA unremarkable  Essential hypertension: BP stable, holding home amlodipine. Meds per cardio  Hyperlipidemia: cont Crestor  New onset A flutter with variable AV block w/ RVR: HR in 130-140s while sitting on edge of the bed with PT, new onset a flutter cardio was consulted EF 45-50% no RWMA small pericardial effusion is present anterior to the right ventricle mitral valve vegetation 55 to valve not well-visualized.  TSH stable.Started on metoprolol and Eliquis   Acute CHF w.  Moderately reduced EF: LVEF 45-50% on echo.Cardiology following and appreciate input, continue diuretics and GDMT per cardiology  Cont to monitor daily I/O,weight, electrolytes and net balance as below.Keep on  salt/fluid restricted diet and monitor in tele. Net IO Since Admission: -1,075 mL [07/23/22 0902]  Filed Weights   07/21/22 0355 07/22/22 0300 07/23/22 0256  Weight: 66.2 kg 66.2 kg 60.1 kg  Urine retention overnight needing in and out cath: Monitor postvoid residual resideu, may need foley catheter if recurrent.Add Flomax  Demand ischemia in the setting of CHF and  A-flutter Mild to moderate MR follow-up outpatient  Normocytic anemia: HB stable. was macrocytic, now normocytic.  Likely related to chronic issues.  No evidence of blood loss hemoglobin stable, avoid anticoagulation. Has known bladder cancer.  Recent Labs  Lab 07/20/22 0450 07/20/22 1354 07/21/22 0648 07/22/22 0136 07/23/22 0159  HGB 11.9* 10.6* 9.9* 10.8* 10.2*  HCT 35.0* 34.5* 32.6* 34.9* 34.2*   Cancer of overlapping sites of bladder:he is due for outpatient follow up this week - may need to be rescheduled   Hypokalemia: Repleting po and iv while on diuretics.  AKI on CKD 3a: Likely in the setting of patient's infection, dehydration. In ED s/p lasix, bnp was high.  Creatinine holding stable at 1.8 as below monitor while on diuretics.   Recent Labs    12/10/21 1155 05/14/22 0930 07/19/22 1619 07/20/22 1354 07/21/22 0648 07/22/22 0136 07/22/22 1505 07/23/22 0159  BUN 24* 31* 34*  --  31* 30* 26* 24*  CREATININE 1.33* 1.24 1.97* 1.92* 1.80* 1.71* 1.78* 1.83*  CO2 31 35* 38*  --  37* 37* 40* 38*     Inadequate oral intake, at risk of malnutrition  Nutrition Problem: Inadequate oral intake Etiology: inability to eat Signs/Symptoms: NPO status Interventions: Refer to RD note for recommendations, MVI, Ensure Enlive (each supplement provides 350kcal and 20 grams of protein)   Goals of care patient with advanced age and multiple medical comorbidities, he remains full code.  Patient will benefit with palliative care evaluation given his complex medical comorbidities now with hypercapnic respiratory failure  DVT prophylaxis: apixaban (ELIQUIS) tablet 2.5 mg Start: 07/21/22 2200 Code Status:   Code Status: Full Code Family Communication: plan of care discussed with patient/wife at bedside. Patient status is: Inpatient because of hypercapnic respiratory failure  Level of care: Progressive   Dispo: The patient is from: Home with wife            Anticipated disposition:  TBD Objective: Vitals last 24 hrs: Vitals:   07/22/22 2042 07/23/22 0256 07/23/22 0309 07/23/22 0810  BP: 123/79 (!) 115/103    Pulse: 90 90 74 89  Resp: 19 14  14   Temp: 97.6 F (36.4 C) 98 F (36.7 C)    TempSrc: Oral Axillary    SpO2: 100% 100% 100% 100%  Weight:  60.1 kg    Height:       Weight change: -6.053 kg  Physical Examination: General exam: AA, weak,older appearing HEENT:Oral mucosa moist, Ear/Nose WNL grossly, dentition normal. Respiratory system: bilaterally diminished BS, no use of accessory muscle Cardiovascular system: S1 & S2 +, regular rate. Gastrointestinal system: Abdomen soft, NT,ND,BS+ Nervous System:Alert, awake, moving extremities and grossly nonfocal Extremities: LE ankle edema NEG, lower extremities warm Skin: No rashes,no icterus. MSK: Normal muscle bulk,tone, power   Medications reviewed:  Scheduled Meds:  acetaZOLAMIDE  500 mg Intravenous Q6H   apixaban  2.5 mg Oral BID   escitalopram  10 mg Oral Daily   furosemide  40 mg Intravenous Daily   metoprolol tartrate  25 mg Oral BID   multivitamin with minerals  1 tablet Oral Daily   potassium chloride  40 mEq Oral Once   rosuvastatin  20 mg Oral QHS   sodium chloride flush  3 mL Intravenous Q12H   umeclidinium bromide  1 puff Inhalation Daily   Continuous Infusions:  azithromycin Stopped (07/22/22 1856)   cefTRIAXone (ROCEPHIN)  IV Stopped (07/22/22 1521)   potassium chloride      Diet Order             Diet Heart Room service appropriate? Yes; Fluid consistency: Thin  Diet effective now                  Intake/Output Summary (Last 24 hours) at 07/23/2022 0902 Last data filed at 07/23/2022 0320 Gross per 24 hour  Intake 740.61 ml  Output 850 ml  Net -109.39 ml    Net IO Since Admission: -1,075 mL [07/23/22 0902]  Wt Readings from Last 3 Encounters:  07/23/22 60.1 kg  05/27/22 60.2 kg  05/14/22 60.2 kg    Unresulted Labs (From admission, onward)     Start     Ordered    07/22/22 0500  Basic metabolic panel  Daily,   R     Question:  Specimen collection method  Answer:  Lab=Lab collect   07/21/22 0744   07/22/22 0500  CBC  Daily,   R     Question:  Specimen collection method  Answer:  Lab=Lab collect   07/21/22 0744          Data Reviewed: I have personally reviewed following labs and imaging studies CBC: Recent Labs  Lab 07/19/22 1619 07/19/22 1627 07/20/22 0450 07/20/22 1354 07/21/22 0648 07/22/22 0136 07/23/22 0159  WBC 10.4  --   --  12.8* 13.6* 14.5* 9.6  NEUTROABS 7.9*  --   --   --   --   --   --   HGB 10.2*   < > 11.9* 10.6* 9.9* 10.8* 10.2*  HCT 34.6*   < > 35.0* 34.5* 32.6* 34.9* 34.2*  MCV 100.9*  --   --  98.3 98.2 96.7 100.6*  PLT 195  --   --  205 196 214 192   < > = values in this interval not displayed.    Basic Metabolic Panel: Recent Labs  Lab 07/19/22 1619 07/19/22 1627 07/20/22 0450 07/20/22 1354 07/21/22 0648 07/22/22 0136 07/22/22 1505 07/23/22 0159  NA 136   < > 134*  --  136 137 139 138  K 4.3   < > 4.0  --  3.9 3.3* 3.5 3.1*  CL 91*  --   --   --  86* 88* 87* 89*  CO2 38*  --   --   --  37* 37* 40* 38*  GLUCOSE 118*  --   --   --  85 115* 138* 96  BUN 34*  --   --   --  31* 30* 26* 24*  CREATININE 1.97*  --   --  1.92* 1.80* 1.71* 1.78* 1.83*  CALCIUM 9.2  --   --   --  8.6* 8.6* 8.4* 8.5*  MG  --   --   --   --   --  2.2  --   --    < > = values in this interval not displayed.   GFR: Estimated Creatinine Clearance: 22.4 mL/min (A) (by C-G formula based on SCr of 1.83 mg/dL (H)). Liver Function Tests: Recent Labs  Lab 07/19/22 1617  AST 37  ALT 72*  ALKPHOS 36*  BILITOT 0.4  PROT 6.7  ALBUMIN 4.5   No results for input(s): "LIPASE", "AMYLASE" in the last 168 hours. Recent Labs  Lab 07/19/22 1619  AMMONIA 28    No results for input(s): "TSH", "  T4TOTAL", "FREET4", "T3FREE", "THYROIDAB" in the last 72 hours. Sepsis Labs: Recent Labs  Lab 07/19/22 1619  LATICACIDVEN 0.8     Recent  Results (from the past 240 hour(s))  Blood culture (routine x 2)     Status: None (Preliminary result)   Collection Time: 07/19/22  4:15 PM   Specimen: BLOOD  Result Value Ref Range Status   Specimen Description   Final    BLOOD RIGHT ANTECUBITAL Performed at [facility], [address] [place], [place] [address]    Special Requests   Final    BOTTLES DRAWN AEROBIC AND ANAEROBIC Blood Culture adequate volume Performed at [facility], [address] [place], [place] [address]    Culture   Final    NO GROWTH 4 DAYS Performed at [facility], [address], [place], [place] [address]    Report Status PENDING  Incomplete  Blood culture (routine x 2)     Status: None (Preliminary result)   Collection Time: 07/19/22  4:18 PM   Specimen: BLOOD  Result Value Ref Range Status   Specimen Description   Final    BLOOD LEFT ANTECUBITAL Performed at [facility], [address], [place], [place] [address]    Special Requests   Final    BOTTLES DRAWN AEROBIC AND ANAEROBIC Blood Culture adequate volume Performed at [facility], [address] [place], [place] [address]    Culture   Final    NO GROWTH 4 DAYS Performed at [facility], [address], [place], [place] [address]    Report Status PENDING  Incomplete  SARS Coronavirus 2 by RT PCR (hospital order, performed in [facility] hospital lab) *cepheid single result test* Anterior Nasal Swab     Status: None   Collection Time: 07/19/22  4:19 PM   Specimen: Anterior Nasal Swab  Result Value Ref Range Status   SARS Coronavirus 2 by RT PCR NEGATIVE NEGATIVE Final    Comment: (NOTE) SARS-CoV-2 target nucleic acids are NOT DETECTED.  The SARS-CoV-2 RNA is generally detectable in upper and lower respiratory specimens during the acute phase of infection. The lowest concentration of SARS-CoV-2 viral copies this assay can detect is 250 copies /  mL. A negative result does not preclude SARS-CoV-2 infection and should not be used as the sole basis for treatment or other patient management decisions.  A negative result may occur with improper specimen collection / handling, submission of specimen other than nasopharyngeal swab, presence of viral mutation(s) within the areas targeted by this assay, and inadequate number of viral copies (<250 copies / mL). A negative result must be combined with clinical observations, patient history, and epidemiological information.  Fact Sheet for Patients:   RoadLapTop.co.za  Fact Sheet for Healthcare Providers: http://kim-miller.com/  This test is not yet approved or  cleared by the [place] FDA and has been authorized for detection and/or diagnosis of SARS-CoV-2 by FDA under an Emergency Use Authorization (EUA).  This EUA will remain in effect (meaning this test can be used) for the duration of the COVID-19 declaration under Section 564(b)(1) of the Act, 21 U.S.C. section 360bbb-3(b)(1), unless the authorization is terminated or revoked sooner.  Performed at [facility], [address] [place], [place] [address]     Antimicrobials: Anti-infectives (From admission, onward)    Start     Dose/Rate Route Frequency Ordered Stop   07/20/22 1900  azithromycin (ZITHROMAX) 500 mg in sodium chloride 0.9 %  250 mL IVPB        500 mg 250 mL/hr over 60 Minutes Intravenous Every 24 hours 07/20/22 1208 07/24/22 1759   07/20/22 1300  cefTRIAXone (ROCEPHIN) 2 g in sodium chloride 0.9 % 100 mL IVPB        2 g 200 mL/hr over 30 Minutes Intravenous Every 24 hours 07/20/22 1208 07/25/22 0959   07/19/22 1845  cefTRIAXone (ROCEPHIN) 1 g in sodium chloride 0.9 % 100 mL IVPB        1 g 200 mL/hr over 30 Minutes Intravenous  Once 07/19/22 1837 07/19/22 2036   07/19/22 1845  azithromycin (ZITHROMAX) 500 mg in sodium chloride 0.9 % 250 mL IVPB         500 mg 250 mL/hr over 60 Minutes Intravenous  Once 07/19/22 1837 07/19/22 2036      Culture/Microbiology    Component Value Date/Time   SDES  07/19/2022 1618    BLOOD LEFT ANTECUBITAL Performed at [facility], [address] [place], [place] [address]    [facility] [address]  07/19/2022 1618    BOTTLES DRAWN AEROBIC AND ANAEROBIC Blood Culture adequate volume Performed at [facility], [address], [place], [place] [address]    CULT  07/19/2022 1618    NO GROWTH 4 DAYS Performed at [facility], [address] [place], [place] [address]    REPTSTATUS PENDING 07/19/2022 1618  Radiology Studies: ECHOCARDIOGRAM COMPLETE  Result Date: 07/21/2022    ECHOCARDIOGRAM REPORT   Patient Name:   QUINTERIOUS GEARLDS Date of Exam: 07/21/2022 Medical Rec #:  [id number]       Height:       66.0 in Accession #:    [id number]      Weight:       145.9 lb Date of Birth:  [birth year]       BSA:          1.749 m Patient Age:    89 years        BP:           108/53 mmHg Patient Gender: M               HR:           97 bpm. Exam Location:  Inpatient Procedure: 2D Echo, Cardiac Doppler and Color Doppler Indications:    CHF-Acute Diastolic I50.31  History:        Patient has prior history of Echocardiogram examinations, most                 recent 02/11/2020. CKD 3 and COPD; Risk Factors:Hypertension,                 Dyslipidemia and Former Smoker.  Sonographer:    Dondra Prader RVT RCS Referring Phys: [id number] EMILY B MULLEN  Sonographer Comments: Technically challenging study due to limited acoustic windows, Technically difficult study due to poor echo windows, suboptimal parasternal window, suboptimal apical window and suboptimal subcostal window. Image acquisition challenging due to respiratory motion, Image acquisition challenging due to patient body habitus and Image acquisition challenging due to uncooperative patient. IMPRESSIONS  1. Left ventricular ejection fraction, by  estimation, is 45 to 50%. The left ventricle has mildly decreased function. The left ventricle has no regional wall motion abnormalities. There is mild left ventricular hypertrophy. Left ventricular diastolic parameters are indeterminate.  2. Right ventricular systolic function is normal. The right ventricular size is mildly enlarged.  3. Right atrial size was mildly  dilated.  4. A small pericardial effusion is present. The pericardial effusion is anterior to the right ventricle. There is no evidence of cardiac tamponade.  5. The mitral valve is degenerative. Mild to moderate mitral valve regurgitation.  6. The aortic valve was not well visualized. Aortic valve regurgitation is not visualized. Comparison(s): Prior images reviewed side by side. LVEF appears slightly decreased; this study is more technically difficult than prior. FINDINGS  Left Ventricle: LVOT calcification unchanged from prior. Left ventricular ejection fraction, by estimation, is 45 to 50%. The left ventricle has mildly decreased function. The left ventricle has no regional wall motion abnormalities. The left ventricular internal cavity size was normal in size. There is mild left ventricular hypertrophy. Left ventricular diastolic parameters are indeterminate. Right Ventricle: The right ventricular size is mildly enlarged. No increase in right ventricular wall thickness. Right ventricular systolic function is normal. Left Atrium: Left atrial size was normal in size. Right Atrium: Right atrial size was mildly dilated. Pericardium: A small pericardial effusion is present. The pericardial effusion is anterior to the right ventricle. There is no evidence of cardiac tamponade. Mitral Valve: The mitral valve is degenerative in appearance. Mild to moderate mitral valve regurgitation. Tricuspid Valve: The tricuspid valve is normal in structure. Tricuspid valve regurgitation is mild . No evidence of tricuspid stenosis. Aortic Valve: The aortic valve was not  well visualized. Aortic valve regurgitation is not visualized. Aortic valve mean gradient measures 4.0 mmHg. Aortic valve peak gradient measures 6.7 mmHg. Aortic valve area, by VTI measures 1.77 cm. Pulmonic Valve: The pulmonic valve was not well visualized. Pulmonic valve regurgitation is not visualized. Aorta: The aortic root is normal in size and structure and the ascending aorta was not well visualized. IAS/Shunts: No atrial level shunt detected by color flow Doppler.  LEFT VENTRICLE PLAX 2D LVIDd:         4.20 cm   Diastology LVIDs:         3.30 cm   LV e' medial:    6.50 cm/s LV PW:         1.10 cm   LV E/e' medial:  10.7 LV IVS:        0.90 cm   LV e' lateral:   8.18 cm/s LVOT diam:     1.90 cm   LV E/e' lateral: 8.5 LV SV:         38 LV SV Index:   22 LVOT Area:     2.84 cm  RIGHT VENTRICLE             IVC RV Basal diam:  3.90 cm     IVC diam: 2.50 cm RV S prime:     11.70 cm/s TAPSE (M-mode): 1.7 cm LEFT ATRIUM           Index        RIGHT ATRIUM           Index LA diam:      3.90 cm 2.23 cm/m   RA Area:     18.20 cm LA Vol (A2C): 48.6 ml 27.79 ml/m  RA Volume:   56.90 ml  32.53 ml/m LA Vol (A4C): 37.5 ml 21.44 ml/m  AORTIC VALVE AV Area (Vmax):    1.62 cm AV Area (Vmean):   1.57 cm AV Area (VTI):     1.77 cm AV Vmax:           129.00 cm/s AV Vmean:          88.200 cm/s  AV VTI:            0.216 m AV Peak Grad:      6.7 mmHg AV Mean Grad:      4.0 mmHg LVOT Vmax:         73.70 cm/s LVOT Vmean:        48.800 cm/s LVOT VTI:          0.135 m LVOT/AV VTI ratio: 0.62  AORTA Ao Root diam: 3.40 cm MITRAL VALVE               TRICUSPID VALVE MV Area (PHT): 4.67 cm    TR Peak grad:   30.2 mmHg MV Decel Time: 163 msec    TR Vmax:        275.00 cm/s MV E velocity: 69.85 cm/s MV A velocity: 61.70 cm/s  SHUNTS MV E/A ratio:  1.13        Systemic VTI:  0.14 m                            Systemic Diam: 1.90 cm Riley Lam MD Electronically signed by Riley Lam MD Signature Date/Time:  07/21/2022/5:42:19 PM    Final      LOS: 3 days   Lanae Boast, MD [facility]  07/23/2022, 9:02 AM

## 2022-07-23 NOTE — Progress Notes (Signed)
   07/22/22 2321  BiPAP/CPAP/SIPAP  BiPAP/CPAP/SIPAP Pt Type Adult  BiPAP/CPAP/SIPAP V60  Mask Type Full face mask  Mask Size Medium  Set Rate 16 breaths/min  Respiratory Rate 20 breaths/min  IPAP 15 cmH20  EPAP 5 cmH2O  FiO2 (%) 30 %  Minute Ventilation 7.6  Leak 14  Peak Inspiratory Pressure (PIP) 13  Tidal Volume (Vt) 427  Patient Home Equipment No  Auto Titrate No

## 2022-07-23 NOTE — Progress Notes (Signed)
Pt placed on Bipap for the night.  

## 2022-07-23 NOTE — Progress Notes (Addendum)
Rounding Note    Patient Name: Gerald Valdez Date of Encounter: 07/23/2022  [facility] Cardiologist: Kristeen Miss, MD   Subjective   Currently sleeping and difficult to arouse. Not sure if this is due to increase hypercapnia or just a heavy sleeper. Wife at the bedside   Inpatient Medications    Scheduled Meds:  acetaZOLAMIDE  500 mg Intravenous Q6H   apixaban  2.5 mg Oral BID   escitalopram  10 mg Oral Daily   furosemide  40 mg Intravenous Daily   metoprolol tartrate  25 mg Oral BID   multivitamin with minerals  1 tablet Oral Daily   potassium chloride  40 mEq Oral Once   rosuvastatin  20 mg Oral QHS   sodium chloride flush  3 mL Intravenous Q12H   umeclidinium bromide  1 puff Inhalation Daily   Continuous Infusions:  azithromycin Stopped (07/22/22 1856)   cefTRIAXone (ROCEPHIN)  IV Stopped (07/22/22 1521)   potassium chloride     PRN Meds: ipratropium-albuterol, polyethylene glycol   Vital Signs    Vitals:   07/22/22 2042 07/23/22 0256 07/23/22 0309 07/23/22 0810  BP: 123/79 (!) 115/103    Pulse: 90 90 74 89  Resp: 19 14  14   Temp: 97.6 F (36.4 C) 98 F (36.7 C)    TempSrc: Oral Axillary    SpO2: 100% 100% 100% 100%  Weight:  60.1 kg    Height:        Intake/Output Summary (Last 24 hours) at 07/23/2022 0902 Last data filed at 07/23/2022 0320 Gross per 24 hour  Intake 740.61 ml  Output 850 ml  Net -109.39 ml       07/23/2022    2:56 AM 07/22/2022    3:00 AM 07/21/2022    3:55 AM  Last 3 Weights  Weight (lbs) 132 lb 9.6 oz 145 lb 15.1 oz 145 lb 14.4 oz  Weight (kg) 60.147 kg 66.2 kg 66.18 kg      Telemetry    Atrial flutter with variable rates.  Sustains heart rates around 90s. No longer jumping up into the 140s.- Personally Reviewed  ECG   No new tracings. . - Personally Reviewed  Physical Exam   GEN: Sleeping, difficult to arouse. On Bipap. Neck: + mild JVD Cardiac: , tachycardic irregular. Respiratory: Diminished breath  sounds, GI: Soft, nontender, non-distended  MS: no edema  Neuro:  Nonfocal  Psych: Normal affect   Labs    High Sensitivity Troponin:   Recent Labs  Lab 07/19/22 1619 07/21/22 1001 07/21/22 1201  TROPONINIHS 42* 47* 47*      Chemistry Recent Labs  Lab 07/19/22 1617 07/19/22 1619 07/22/22 0136 07/22/22 1505 07/23/22 0159  NA  --    < > 137 139 138  K  --    < > 3.3* 3.5 3.1*  CL  --    < > 88* 87* 89*  CO2  --    < > 37* 40* 38*  GLUCOSE  --    < > 115* 138* 96  BUN  --    < > 30* 26* 24*  CREATININE  --    < > 1.71* 1.78* 1.83*  CALCIUM  --    < > 8.6* 8.4* 8.5*  MG  --   --  2.2  --   --   PROT 6.7  --   --   --   --   ALBUMIN 4.5  --   --   --   --  AST 37  --   --   --   --   ALT 72*  --   --   --   --   ALKPHOS 36*  --   --   --   --   BILITOT 0.4  --   --   --   --   GFRNONAA  --    < > 37* 36* 34*  ANIONGAP  --    < > 12 12 11    < > = values in this interval not displayed.     Lipids No results for input(s): "CHOL", "TRIG", "HDL", "LABVLDL", "LDLCALC", "CHOLHDL" in the last 168 hours.  Hematology Recent Labs  Lab 07/21/22 0648 07/22/22 0136 07/23/22 0159  WBC 13.6* 14.5* 9.6  RBC 3.32* 3.61* 3.40*  HGB 9.9* 10.8* 10.2*  HCT 32.6* 34.9* 34.2*  MCV 98.2 96.7 100.6*  MCH 29.8 29.9 30.0  MCHC 30.4 30.9 29.8*  RDW 13.4 13.4 13.6  PLT 196 214 192    Thyroid  Recent Labs  Lab 07/19/22 1619  TSH 2.145     BNP Recent Labs  Lab 07/19/22 1619  BNP 644.6*     DDimer No results for input(s): "DDIMER" in the last 168 hours.   Radiology    ECHOCARDIOGRAM COMPLETE  Result Date: 07/21/2022    ECHOCARDIOGRAM REPORT   Patient Name:   Gerald Valdez Date of Exam: 07/21/2022 Medical Rec #:  [id number]       Height:       66.0 in Accession #:    [id number]      Weight:       145.9 lb Date of Birth:  30-Apr-1928       BSA:          1.749 m Patient Age:    89 years        BP:           108/53 mmHg Patient Gender: M               HR:           97 bpm.  Exam Location:  Inpatient Procedure: 2D Echo, Cardiac Doppler and Color Doppler Indications:    CHF-Acute Diastolic I50.31  History:        Patient has prior history of Echocardiogram examinations, most                 recent 02/11/2020. CKD 3 and COPD; Risk Factors:Hypertension,                 Dyslipidemia and Former Smoker.  Sonographer:    Dondra Prader RVT RCS Referring Phys: [id number] EMILY B MULLEN  Sonographer Comments: Technically challenging study due to limited acoustic windows, Technically difficult study due to poor echo windows, suboptimal parasternal window, suboptimal apical window and suboptimal subcostal window. Image acquisition challenging due to respiratory motion, Image acquisition challenging due to patient body habitus and Image acquisition challenging due to uncooperative patient. IMPRESSIONS  1. Left ventricular ejection fraction, by estimation, is 45 to 50%. The left ventricle has mildly decreased function. The left ventricle has no regional wall motion abnormalities. There is mild left ventricular hypertrophy. Left ventricular diastolic parameters are indeterminate.  2. Right ventricular systolic function is normal. The right ventricular size is mildly enlarged.  3. Right atrial size was mildly dilated.  4. A small pericardial effusion is present. The pericardial effusion is anterior to the right ventricle. There is no evidence of cardiac tamponade.  5. The mitral valve is degenerative. Mild to moderate mitral valve regurgitation.  6. The aortic valve was not well visualized. Aortic valve regurgitation is not visualized. Comparison(s): Prior images reviewed side by side. LVEF appears slightly decreased; this study is more technically difficult than prior. FINDINGS  Left Ventricle: LVOT calcification unchanged from prior. Left ventricular ejection fraction, by estimation, is 45 to 50%. The left ventricle has mildly decreased function. The left ventricle has no regional wall motion abnormalities.  The left ventricular internal cavity size was normal in size. There is mild left ventricular hypertrophy. Left ventricular diastolic parameters are indeterminate. Right Ventricle: The right ventricular size is mildly enlarged. No increase in right ventricular wall thickness. Right ventricular systolic function is normal. Left Atrium: Left atrial size was normal in size. Right Atrium: Right atrial size was mildly dilated. Pericardium: A small pericardial effusion is present. The pericardial effusion is anterior to the right ventricle. There is no evidence of cardiac tamponade. Mitral Valve: The mitral valve is degenerative in appearance. Mild to moderate mitral valve regurgitation. Tricuspid Valve: The tricuspid valve is normal in structure. Tricuspid valve regurgitation is mild . No evidence of tricuspid stenosis. Aortic Valve: The aortic valve was not well visualized. Aortic valve regurgitation is not visualized. Aortic valve mean gradient measures 4.0 mmHg. Aortic valve peak gradient measures 6.7 mmHg. Aortic valve area, by VTI measures 1.77 cm. Pulmonic Valve: The pulmonic valve was not well visualized. Pulmonic valve regurgitation is not visualized. Aorta: The aortic root is normal in size and structure and the ascending aorta was not well visualized. IAS/Shunts: No atrial level shunt detected by color flow Doppler.  LEFT VENTRICLE PLAX 2D LVIDd:         4.20 cm   Diastology LVIDs:         3.30 cm   LV e' medial:    6.50 cm/s LV PW:         1.10 cm   LV E/e' medial:  10.7 LV IVS:        0.90 cm   LV e' lateral:   8.18 cm/s LVOT diam:     1.90 cm   LV E/e' lateral: 8.5 LV SV:         38 LV SV Index:   22 LVOT Area:     2.84 cm  RIGHT VENTRICLE             IVC RV Basal diam:  3.90 cm     IVC diam: 2.50 cm RV S prime:     11.70 cm/s TAPSE (M-mode): 1.7 cm LEFT ATRIUM           Index        RIGHT ATRIUM           Index LA diam:      3.90 cm 2.23 cm/m   RA Area:     18.20 cm LA Vol (A2C): 48.6 ml 27.79 ml/m  RA  Volume:   56.90 ml  32.53 ml/m LA Vol (A4C): 37.5 ml 21.44 ml/m  AORTIC VALVE AV Area (Vmax):    1.62 cm AV Area (Vmean):   1.57 cm AV Area (VTI):     1.77 cm AV Vmax:           129.00 cm/s AV Vmean:          88.200 cm/s AV VTI:            0.216 m AV Peak Grad:      6.7 mmHg  AV Mean Grad:      4.0 mmHg LVOT Vmax:         73.70 cm/s LVOT Vmean:        48.800 cm/s LVOT VTI:          0.135 m LVOT/AV VTI ratio: 0.62  AORTA Ao Root diam: 3.40 cm MITRAL VALVE               TRICUSPID VALVE MV Area (PHT): 4.67 cm    TR Peak grad:   30.2 mmHg MV Decel Time: 163 msec    TR Vmax:        275.00 cm/s MV E velocity: 69.85 cm/s MV A velocity: 61.70 cm/s  SHUNTS MV E/A ratio:  1.13        Systemic VTI:  0.14 m                            Systemic Diam: 1.90 cm Riley Lam MD Electronically signed by Riley Lam MD Signature Date/Time: 07/21/2022/5:42:19 PM    Final     Cardiac Studies   Echocardiogram 07/21/2022 1. Left ventricular ejection fraction, by estimation, is 45 to 50%. The  left ventricle has mildly decreased function. The left ventricle has no  regional wall motion abnormalities. There is mild left ventricular  hypertrophy. Left ventricular diastolic  parameters are indeterminate.   2. Right ventricular systolic function is normal. The right ventricular  size is mildly enlarged.   3. Right atrial size was mildly dilated.   4. A small pericardial effusion is present. The pericardial effusion is  anterior to the right ventricle. There is no evidence of cardiac  tamponade.   5. The mitral valve is degenerative. Mild to moderate mitral valve  regurgitation.   6. The aortic valve was not well visualized. Aortic valve regurgitation  is not visualized.   Comparison(s): Prior images reviewed side by side. LVEF appears slightly  decreased; this study is more technically difficult than prior.   Patient Profile     89 y.o. male with a hx of coronary calcifications, hyperlipidemia,  hypertension, COPD, bladder cancer, chronic respiratory failure with hypoxia, anemia, CKD stage IIIa, who was admitted 07/21/2022 for the evaluation of atrial flutter.  Upon presentation patient was severely hypercapnic and required BiPAP, now on nasal cannula 3 L.  Has underlying COPD.  Assessment & Plan    New onset atrial flutter with variable conduction   Patient presenting with somnolence/altered mental status and found to be severely hypercapnic with underlying COPD.  This is in the setting of recent urinary infection and pneumonia.  Heart rates appear to be more improved and no longer has runs in the 140s.  Still has some mild JVD however no more peripheral edema.  Suspect 1 more day of IV diuretics, can likely transition to PO lasix tomorrow.  Replete K for goal >4  Echocardiogram shows LVEF of 45 to 50% with mild reduction when compared to 2022 study.  Likely related to RVR.  He had mildly dilated RA. On Lopressor 25 mg twice daily for rate control, better rate control today Continue Eliquis 2.5mg  BID.  Has history of duodenal ulcer.  No acute bleeding complications today.  Continue to closely monitor. TSH normal  Likely plan for outpatient DCCV once completes 3 weeks of anticoagulation, as appears rate controlled   Acute heart failure with moderately reduced EF Prior echocardiogram showing normal LVEF of 55 to 60% in 2022.  Echo  on this admission shown 45 to 50%.  Likely tachycardia mediated. BNP 644 during this admission with signs of congestion as noted above.  Also had small pericardial effusion with no tamponade physiology. Continue IV Lasix 40 mg daily, has had issues with hypokalemia as above. Creatinine has actually improved after diuresis.  Will continue IV diuretics, likely transition to PO lasix tomorrow Started on acetazolamide 500mg  per CCM for metabolic alkalosis, suspect this is compensation for his respiratory acidosis as he has been hypercapnic Do not anticipate invasive  ischemic eval given comorbidities.    Coronary calcifications noted on CT in 2022 Hyperlipidemia No anginal symptoms.  Continue to monitor.  Continue statin   Demand ischemia  Has slightly elevated troponins that are flat at 47-47.  Likely due to demand ischemia.   Hypertension PTA amlodipine 5 mg daily.  Continue to hold given greater need for rate control and soft pressures.  Mild to moderate MR Continue to monitor, follow-up outpatient.   COPD Pneumonia Hypercapnia- appears tenuous and no is back on BIPAP. CCM following.  CKD stage IIIa difficult to measure baseline.  However creatinine appears to be around 1.8   For questions or updates, please contact [facility] Please consult [email or web address] for contact info under        Signed, Abagail Kitchens, PA-C  07/23/2022, 9:02 AM     Patient seen and examined.  Agree with above documentation.  On exam, patient is alert and orientedx2, regular rate and rhythm, no murmurs, diminished breath sounds, no LE edema or JVD.  BP 104/62, recorded as net negative 100cc but had multiple episodes of unmeasured UOP. Cr stable at 1.8.  Volume status improved, received IV Lasix this morning, suspect can transition to p.o. Lasix tomorrow.  Remains in Aflutter, with rates controlled, continue metoprolol.  Plan for outpatient DCCV once completed 3 weeks anticoagulation.    Little Ishikawa, MD

## 2022-07-24 DIAGNOSIS — J9622 Acute and chronic respiratory failure with hypercapnia: Secondary | ICD-10-CM | POA: Diagnosis not present

## 2022-07-24 DIAGNOSIS — Z515 Encounter for palliative care: Secondary | ICD-10-CM

## 2022-07-24 DIAGNOSIS — Z7189 Other specified counseling: Secondary | ICD-10-CM

## 2022-07-24 DIAGNOSIS — Z66 Do not resuscitate: Secondary | ICD-10-CM

## 2022-07-24 DIAGNOSIS — J9621 Acute and chronic respiratory failure with hypoxia: Secondary | ICD-10-CM | POA: Diagnosis not present

## 2022-07-24 DIAGNOSIS — I4892 Unspecified atrial flutter: Secondary | ICD-10-CM | POA: Diagnosis not present

## 2022-07-24 DIAGNOSIS — I509 Heart failure, unspecified: Secondary | ICD-10-CM | POA: Diagnosis not present

## 2022-07-24 LAB — BASIC METABOLIC PANEL
Anion gap: 11 (ref 5–15)
BUN: 29 mg/dL — ABNORMAL HIGH (ref 8–23)
CO2: 33 mmol/L — ABNORMAL HIGH (ref 22–32)
Calcium: 8.2 mg/dL — ABNORMAL LOW (ref 8.9–10.3)
Chloride: 95 mmol/L — ABNORMAL LOW (ref 98–111)
Creatinine, Ser: 1.92 mg/dL — ABNORMAL HIGH (ref 0.61–1.24)
GFR, Estimated: 32 mL/min — ABNORMAL LOW (ref >60)
Glucose, Bld: 106 mg/dL — ABNORMAL HIGH (ref 70–99)
Potassium: 3.9 mmol/L (ref 3.5–5.1)
Sodium: 139 mmol/L (ref 135–145)

## 2022-07-24 LAB — CBC
HCT: 32.8 % — ABNORMAL LOW (ref 39.0–52.0)
Hemoglobin: 10 g/dL — ABNORMAL LOW (ref 13.0–17.0)
MCH: 30.6 pg (ref 26.0–34.0)
MCHC: 30.5 g/dL (ref 30.0–36.0)
MCV: 100.3 fL — ABNORMAL HIGH (ref 80.0–100.0)
Platelets: 179 10*3/uL (ref 150–400)
RBC: 3.27 MIL/uL — ABNORMAL LOW (ref 4.22–5.81)
RDW: 13.4 % (ref 11.5–15.5)
WBC: 8.7 10*3/uL (ref 4.0–10.5)
nRBC: 0 % (ref 0.0–0.2)

## 2022-07-24 LAB — MAGNESIUM: Magnesium: 2 mg/dL (ref 1.7–2.4)

## 2022-07-24 LAB — CULTURE, BLOOD (ROUTINE X 2)
Culture: NO GROWTH
Special Requests: ADEQUATE

## 2022-07-24 MED ORDER — METOPROLOL TARTRATE 12.5 MG HALF TABLET: 12.5000 mg | ORAL_TABLET | Freq: Two times a day (BID) | ORAL | Status: DC

## 2022-07-24 NOTE — Plan of Care (Signed)
  Problem: Education: Goal: Knowledge of General Education information will improve Description: Including pain rating scale, medication(s)/side effects and non-pharmacologic comfort measures Outcome: Progressing   Problem: Health Behavior/Discharge Planning: Goal: Ability to manage health-related needs will improve Outcome: Progressing   Problem: Clinical Measurements: Goal: Ability to maintain clinical measurements within normal limits will improve Outcome: Progressing Goal: Will remain free from infection Outcome: Progressing Goal: Diagnostic test results will improve Outcome: Progressing Goal: Respiratory complications will improve Outcome: Progressing Goal: Cardiovascular complication will be avoided Outcome: Progressing   Problem: Activity: Goal: Risk for activity intolerance will decrease Outcome: Progressing   Problem: Nutrition: Goal: Adequate nutrition will be maintained Outcome: Progressing   Problem: Coping: Goal: Level of anxiety will decrease Outcome: Progressing   Problem: Elimination: Goal: Will not experience complications related to bowel motility Outcome: Progressing Goal: Will not experience complications related to urinary retention Outcome: Progressing   Problem: Pain Managment: Goal: General experience of comfort will improve Outcome: Progressing   Problem: Safety: Goal: Ability to remain free from injury will improve Outcome: Progressing   Problem: Skin Integrity: Goal: Risk for impaired skin integrity will decrease Outcome: Progressing   Problem: Education: Goal: Ability to demonstrate management of disease process will improve Outcome: Progressing Goal: Ability to verbalize understanding of medication therapies will improve Outcome: Progressing Goal: Individualized Educational Video(s) Outcome: Progressing   Problem: Activity: Goal: Capacity to carry out activities will improve Outcome: Progressing   Problem: Cardiac: Goal:  Ability to achieve and maintain adequate cardiopulmonary perfusion will improve Outcome: Progressing   Problem: Activity: Goal: Ability to tolerate increased activity will improve Outcome: Progressing   Problem: Clinical Measurements: Goal: Ability to maintain a body temperature in the normal range will improve Outcome: Progressing   Problem: Respiratory: Goal: Ability to maintain adequate ventilation will improve Outcome: Progressing Goal: Ability to maintain a clear airway will improve Outcome: Progressing   

## 2022-07-24 NOTE — Progress Notes (Signed)
Rounding Note    Patient Name: Gerald Valdez Date of Encounter: 07/24/2022   [facility] Cardiologist: Kristeen Miss, MD   Subjective   Awake and alert, his wife is at bedside. He is asymptomatic  Inpatient Medications    Scheduled Meds:  apixaban  2.5 mg Oral BID   atorvastatin  40 mg Oral Daily   escitalopram  10 mg Oral Daily   furosemide  40 mg Oral Daily   metoprolol tartrate  25 mg Oral BID   multivitamin with minerals  1 tablet Oral Daily   sodium chloride flush  3 mL Intravenous Q12H   tamsulosin  0.4 mg Oral QPC supper   umeclidinium bromide  1 puff Inhalation Daily   Continuous Infusions:   PRN Meds: ipratropium-albuterol, polyethylene glycol   Vital Signs    Vitals:   07/24/22 0400 07/24/22 0605 07/24/22 0807 07/24/22 0845  BP:  111/64 127/68   Pulse: 79 71    Resp: 18 17    Temp:  98.5 F (36.9 C) 97.8 F (36.6 C)   TempSrc:  Axillary Axillary   SpO2: 100% 100%  100%  Weight:  60.3 kg    Height:        Intake/Output Summary (Last 24 hours) at 07/24/2022 1137 Last data filed at 07/23/2022 2304 Gross per 24 hour  Intake 630.67 ml  Output 1810 ml  Net -1179.33 ml      07/24/2022    6:05 AM 07/23/2022    2:56 AM 07/22/2022    3:00 AM  Last 3 Weights  Weight (lbs) 132 lb 15 oz 132 lb 9.6 oz 145 lb 15.1 oz  Weight (kg) 60.3 kg 60.147 kg 66.2 kg      Telemetry    Atrial flutter no RVR.- Personally Reviewed  ECG   No new tracings. . - Personally Reviewed  Physical Exam   GEN: Sleeping, difficult to arouse. On Bipap. Neck: + mild JVD Cardiac: , tachycardic irregular. Respiratory: Diminished breath sounds, GI: Soft, nontender, non-distended  MS: no edema  Neuro:  Nonfocal  Psych: Normal affect   Labs    High Sensitivity Troponin:   Recent Labs  Lab 07/19/22 1619 07/21/22 1001 07/21/22 1201  TROPONINIHS 42* 47* 47*     Chemistry Recent Labs  Lab 07/19/22 1617 07/19/22 1619 07/22/22 0136 07/22/22 1505  07/23/22 0159 07/24/22 0141  NA  --    < > 137 139 138 139  K  --    < > 3.3* 3.5 3.1* 3.9  CL  --    < > 88* 87* 89* 95*  CO2  --    < > 37* 40* 38* 33*  GLUCOSE  --    < > 115* 138* 96 106*  BUN  --    < > 30* 26* 24* 29*  CREATININE  --    < > 1.71* 1.78* 1.83* 1.92*  CALCIUM  --    < > 8.6* 8.4* 8.5* 8.2*  MG  --   --  2.2  --   --  2.0  PROT 6.7  --   --   --   --   --   ALBUMIN 4.5  --   --   --   --   --   AST 37  --   --   --   --   --   ALT 72*  --   --   --   --   --  ALKPHOS 36*  --   --   --   --   --   BILITOT 0.4  --   --   --   --   --   GFRNONAA  --    < > 37* 36* 34* 32*  ANIONGAP  --    < > 12 12 11 11    < > = values in this interval not displayed.    Lipids No results for input(s): "CHOL", "TRIG", "HDL", "LABVLDL", "LDLCALC", "CHOLHDL" in the last 168 hours.  Hematology Recent Labs  Lab 07/22/22 0136 07/23/22 0159 07/24/22 0141  WBC 14.5* 9.6 8.7  RBC 3.61* 3.40* 3.27*  HGB 10.8* 10.2* 10.0*  HCT 34.9* 34.2* 32.8*  MCV 96.7 100.6* 100.3*  MCH 29.9 30.0 30.6  MCHC 30.9 29.8* 30.5  RDW 13.4 13.6 13.4  PLT 214 192 179   Thyroid  Recent Labs  Lab 07/19/22 1619  TSH 2.145    BNP Recent Labs  Lab 07/19/22 1619  BNP 644.6*    DDimer No results for input(s): "DDIMER" in the last 168 hours.   Radiology    No results found.  Cardiac Studies   Echocardiogram 07/21/2022 1. Left ventricular ejection fraction, by estimation, is 45 to 50%. The  left ventricle has mildly decreased function. The left ventricle has no  regional wall motion abnormalities. There is mild left ventricular  hypertrophy. Left ventricular diastolic  parameters are indeterminate.   2. Right ventricular systolic function is normal. The right ventricular  size is mildly enlarged.   3. Right atrial size was mildly dilated.   4. A small pericardial effusion is present. The pericardial effusion is  anterior to the right ventricle. There is no evidence of cardiac  tamponade.    5. The mitral valve is degenerative. Mild to moderate mitral valve  regurgitation.   6. The aortic valve was not well visualized. Aortic valve regurgitation  is not visualized.   Comparison(s): Prior images reviewed side by side. LVEF appears slightly  decreased; this study is more technically difficult than prior.   Patient Profile     89 y.o. male with a hx of coronary calcifications, hyperlipidemia, hypertension, COPD, bladder cancer, chronic respiratory failure with hypoxia, anemia, CKD stage IIIa, who was admitted 07/21/2022 for the evaluation of atrial flutter.  Upon presentation patient was severely hypercapnic and required BiPAP, now on nasal cannula 3 L.  Has underlying COPD.  Assessment & Plan    New onset atrial flutter with variable conduction   Patient presenting with somnolence/altered mental status and found to be severely hypercapnic with underlying COPD.  This is in the setting of recent urinary infection and pneumonia.   - rate controlled 4:1 atrial flutter - can consider outpatient DCCV if he does not convert in 3 weeks on [facility] - continue eliquis - continue Lopressor 25 mg twice daily    Acute heart failure with moderately reduced EF Prior echocardiogram showing normal LVEF of 55 to 60% in 2022.  Echo on this admission shown 45 to 50%.  Likely tachycardia mediated. BNP 644 during this admission with signs of congestion as noted above.  Also had small pericardial effusion with no tamponade physiology. Continue PO lasix today. He is euvolemic; improving from a respiratory status Agree with stopping acetazolamide 500mg  per CCM for metabolic alkalosis, suspect this is compensation for his respiratory acidosis as he has been hypercapnic; CO2 33 Do not anticipate invasive ischemic eval given comorbidities.    Stable Conditions  Coronary  calcifications noted on CT in 2022 Hyperlipidemia No anginal symptoms.  Continue to monitor.  Continue statin   Demand ischemia  Has  slightly elevated troponins that are flat at 47-47.  Likely due to demand ischemia.   Hypertension PTA amlodipine 5 mg daily.  Continue to hold given greater need for rate control and soft pressures.  Mild to moderate MR Continue to monitor, follow-up outpatient.   COPD Pneumonia Hypercapnia- appears tenuous and no is back on BIPAP. CCM following.  CKD stage IIIa difficult to measure baseline.  However creatinine appears to be around 1.8   Cardiology will follow peripherally  For questions or updates, please contact [facility] Please consult [email or web address] for contact info under        Signed, Maisie Fus, MD  07/24/2022, 11:37 AM    Maisie Fus, MD

## 2022-07-24 NOTE — Consult Note (Signed)
Palliative Medicine Inpatient Consult Note  Consulting Provider: Dr. Jonathon Bellows   Reason for consult:   Palliative Care Consult Services Palliative Medicine Consult  Reason for Consult? GOC, code status   07/24/2022  HPI:  Per intake H&P --> 89 year old man with history significant of bladder cancer, chronic respiratory failure with hypoxia, COPD, HTN, GERD, HLD, h/o anemia, CKD 3a, malnutrition  with UTI ( since last Wednesday, on antibiotics) presented with somnolence and confusion, likely due to his wife's stopping his antibiotics due to concern of drug effect. On last Friday more sleepy and on Sunday could no wake him up. Admitted for RLL PNA. Palliative care has been asked to get involved for further conversations related to code status.   Clinical Assessment/Goals of Care:  *Please note that this is a verbal dictation therefore any spelling or grammatical errors are due to the "Dragon Medical One" system interpretation.  I have reviewed medical records including EPIC notes, labs and imaging, received report from bedside RN, assessed the patient who is resting in bed in NAD.    I met with Gerald Valdez and his spouse, Gerald Valdez  to further discuss diagnosis prognosis, GOC, EOL wishes, disposition and options.   I introduced Palliative Medicine as specialized medical care for people living with serious illness. It focuses on providing relief from the symptoms and stress of a serious illness. The goal is to improve quality of life for both the patient and the family.  Medical History Review and Understanding:  A review of [facility] PMH inclusive of bladder cancer,  hypertension, GERD, hyperlipidemia, anemia, chronic kidney disease, and COPD/chronic hypoxic respiratory failure on 3 L of oxygen at home.  Social History:  Gerald Valdez is from [place] he has lived there throughout the duration of his life.  He has been married to his wife Gerald Valdez for the last 69 years.  They share 1 son, 1  daughter, 5 grandchildren and 6 grandchildren.  Gerald Valdez expresses that they are expecting 1/7 grandchild in December which is something to look forward to.  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  For enjoyment Gerald Valdez likes watching baseball, fishing, and used to be an avid Chief Financial Officer.  Gerald Valdez is a man of faith and practices within the [employer] denomination.  Functional and Nutritional State:  Preceding hospitalization Gerald Valdez was fully functional and independent per his wife able to perform all BADLs on his own.  He did have a decent appetite in the home environment.  Advance Directives:  A detailed discussion was had today regarding advanced directives.    Code Status:  Concepts specific to code status, artifical feeding and hydration, continued IV antibiotics and rehospitalization was had.  The difference between a aggressive medical intervention path  and a palliative comfort care path for this patient at this time was had.   Encouraged patient/family to consider DNR/DNI status understanding evidenced based poor outcomes in similar hospitalized patient, as the cause of arrest is likely associated with advanced chronic/terminal illness rather than an easily reversible acute cardio-pulmonary event. I explained that DNR/DNI does not change the medical plan and it only comes into effect after a person has arrested (died).  It is a protective measure to keep [place] from harming the patient in their last moments of life.   Established that Gerald Valdez will be a DNAR/DNI moving forward but to continue present scope of treatment.  Provided  "Hard Choices  for [employer]" booklet and MOST form for review.   Discussion:  We discussed that Gerald Valdez has been working for the 1-1/2 to 2-years fighting bladder cancer. We reviewed the declines Gerald Valdez has endured in the past year as last May he was afflicted with c.diff and stopped  driving afterwards. Gerald Valdez is aware of how each illness/hospitalization can lead to additional physical declines. We reviewed the importance of daily mobility, as of presently mobility efforts have been inhibited by hypotension though we discussed continuing to try.   Patient spouse vocalizes the hope(s) for improvement and thus far she has seen Gerald Valdez improve during hospitalization from a respiratory perspective.   We discussed allowing time for outcomes. Patients spouse shares that she and her family would like for Gerald Valdez to transition home with home health after this hospital stay.   Discussed the importance of continued conversation with family and their  medical providers regarding overall plan of care and treatment options, ensuring decisions are within the context of the patients values and GOCs.  Decision Maker: Gerald Valdez (Spouse):[phone] (Mobile)   SUMMARY OF RECOMMENDATIONS   DNAR/DNI  Plan to complete MOST prior to discharge  Continue present scope of care allowing time for improvements  Patients spouse would like him to go home with [facility] post hospitalization  Ongoing PMT support  Code Status/Advance Care Planning: DNAR/DNI  Palliative Prophylaxis:  Aspiration, Bowel Regimen, Delirium Protocol, Frequent Pain Assessment, Oral Care, Palliative Wound Care, and Turn Reposition  Additional Recommendations (Limitations, Scope, Preferences): Continue present care  Psycho-social/Spiritual:  Desire for further Chaplaincy support: Yes Additional Recommendations: Education on present clinical condition - PNA   Prognosis: Multiple chronic co-morbid conditions place Gerald Valdez at an elevated 12 month mortality risk.   Discharge Planning: Discharge plan to home with [facility] once medically optimized.  Vitals:   07/24/22 0807 07/24/22 0845  BP: 127/68   Pulse:    Resp:    Temp: 97.8 F (36.6 C)   SpO2:  100%    Intake/Output Summary (Last 24 hours) at 07/24/2022 1107 Last data  filed at 07/23/2022 2304 Gross per 24 hour  Intake 630.67 ml  Output 1810 ml  Net -1179.33 ml   Last Weight  Most recent update: 07/24/2022  6:07 AM    Weight  60.3 kg (132 lb 15 oz)            Gen:  Elderly caucasian M in NAD HEENT: moist mucous membranes CV: Regular rate and rhythm,  PULM: ON 3LPM [place], breathing is even and nonlabored   ABD: soft/nontender  EXT: No edema  Neuro: Alert and oriented x2  PPS: 40%   This conversation/these recommendations were discussed with patient primary care team, Dr. Jonathon Bellows  Billing based on MDM: High ______________________________________________________ Gerald Valdez [facility] Palliative Medicine Team Team Cell Phone: [phone] Please utilize secure chat with additional questions, if there is no response within 30 minutes please call the above phone number  Palliative Medicine Team providers are available by phone from 7am to 7pm daily and can be reached through the team cell phone.  Should this patient require assistance outside of these hours, please call the patient's attending physician.

## 2022-07-24 NOTE — Progress Notes (Signed)
PROGRESS NOTE Gerald Valdez  WUX:[id number] DOB: 04-Aug-1928 DOA: 07/19/2022 PCP: [name], MD  Brief Narrative/Hospital Course: 89 year old man with history significant of bladder cancer, chronic respiratory failure with hypoxia, COPD, HTN, GERD, HLD, h/o anemia, CKD 3a, malnutrition  with UTI ( since last Wednesday, on antibiotics) presented with somnolence and confusion, likely due to his wife's stopping his antibiotics due to concern of drug effect. On last Friday more sleepy and on Sunday could no wake him up. In the ED found to have severe hypercapnia with pCO2 101, chest x-ray possible  RLL pneumonia w/ small parapneumonic effusion, creatinine elevated 1.9 stable above baseline of 1.3 BNP 644 troponin 42, WBC 10.4 and he has a macrocytic anemia, H/H 10 and 34. TSH was 2.145 , COVID-negative EKG showed a flutter with PVC.  Patient was placed on BiPAP, further is mentation improving.  Patient admitted. Patient went into a flutter with RVR with minimal activity cardiology was consulted> starting on Eliquis with plan for TEE/DCCV if difficulty rate control.  Also started on IV Lasix for acute CHF   Subjective: Patient seen and examined this morning Overnight afebrile used BiPAP at bedtime Labs showed bicarb down to 33, creatinine 1.9 CBC with chronic anemia WBC normalized He is off BiPAP, wife at the bedside Bit confused but overall much better  Assessment and Plan: Principal Problem:   Acute on chronic respiratory failure with hypoxia and hypercapnia (HCC) Active Problems:   COPD (chronic obstructive pulmonary disease) (HCC)   Essential hypertension   Hyperlipidemia   Cancer of overlapping sites of bladder (HCC)   AKI (acute kidney injury) (HCC)   Stage 3a chronic kidney disease (CKD) (HCC)   Protein-calorie malnutrition, moderate (HCC)   UTI (urinary tract infection)   Chronic respiratory failure with hypoxia (HCC)   Atrial flutter with rapid ventricular response (HCC)    Acute heart failure (HCC)   Acute on chronic respiratory failure with hypoxia and hypercapnia Suspected COPD-long standing remote smoking history RLL pneumonia with possible parapneumonic effusion: S/P BiPAP initially Then was on [place] 3l(home setting), continued on IV antibiotics for community-acquired pneumonia. On 6/13 became lethargic with ABG still showing hypercapnia PCCM consulted> resumed BiPAP then switched to bedtime, which she is tolerating.  Continue oral Lasix, bronchodilators/nebs. S/p Diamox and bicarb improving.  Will likely need home BiPAP-await further pulmonary input  Acute metabolic encephalopathy due to hypercapnia Mild cognitive impairment -suspect mild dementia at baseline Mentation much better overall improving on BiPAP.Continue PT OT, supportive care delirium precaution.  Continue BiPAP nightly.    Recent UTI, on antibiotics outpatient UA unremarkable  Essential hypertension: BP well-controlled on Lasix and metoprolol amlodipine on hold  Hyperlipidemia: cont Crestor  New onset A flutter with variable AV block w/ RVR: EF 45-50% no RWMA small pericardial effusion.TSH stable.Started on metoprolol and Eliquis, rate controlled  Acute CHF w.  Moderately reduced EF: LVEF 45-50% on echo.Cardiology following and appreciate input Continue oral Lasix, metoprolol GDMT per cardiology  Cont to monitor daily I/O,weight, electrolytes and net balance as below.Keep on  salt/fluid restricted diet and monitor in tele. Net IO Since Admission: -2,254.33 mL [07/24/22 1111]  Filed Weights   07/22/22 0300 07/23/22 0256 07/24/22 0605  Weight: 66.2 kg 60.1 kg 60.3 kg    Urine retention overnight needing in and out cath: Monitor postvoid residual resideu, may need foley catheter if recurrent. Added on Flomax  Demand ischemia in the setting of CHF and A-flutter: Plan for cardiology Mild to moderate MR follow-up  outpatient  Normocytic anemia: HB stable. was macrocytic, now normocytic.   Likely related to chronic issues.No evidence of blood loss hemoglobin stable, avoid anticoagulation. Has known bladder cancer.  Recent Labs  Lab 07/20/22 1354 07/21/22 0648 07/22/22 0136 07/23/22 0159 07/24/22 0141  HGB 10.6* 9.9* 10.8* 10.2* 10.0*  HCT 34.5* 32.6* 34.9* 34.2* 32.8*   Cancer of overlapping sites of bladder:he is due for outpatient follow up this week - may need to be rescheduled   Hypokalemia: Repleted  AKI on CKD 3a: Likely in the setting of patient's infection, dehydration. In ED s/p lasix, bnp was high. Creatinine holding stable as below monitor while on diuretics.   Recent Labs    12/10/21 1155 05/14/22 0930 07/19/22 1619 07/20/22 1354 07/21/22 0648 07/22/22 0136 07/22/22 1505 07/23/22 0159 07/24/22 0141  BUN 24* 31* 34*  --  31* 30* 26* 24* 29*  CREATININE 1.33* 1.24 1.97* 1.92* 1.80* 1.71* 1.78* 1.83* 1.92*  CO2 31 35* 38*  --  37* 37* 40* 38* 33*    Inadequate oral intake, at risk of malnutrition  Nutrition Problem: Inadequate oral intake Etiology: inability to eat Signs/Symptoms: NPO status Interventions: Refer to RD note for recommendations, MVI, Ensure Enlive (each supplement provides 350kcal and 20 grams of protein)  Goals of care patient with advanced age and multiple medical comorbidities, palliative care consultation was requested now changed to DNR/DNI overall prognosis does not appear bright.  Continue on full scope of treatment  DVT prophylaxis: apixaban (ELIQUIS) tablet 2.5 mg Start: 07/21/22 2200 Code Status:   Code Status: DNR Family Communication: plan of care discussed with patient/wife at bedside. Patient status is: Inpatient because of hypercapnic respiratory failure  Level of care: Progressive   Dispo: The patient is from: Home with wife            Anticipated disposition: SNF 1-2 days  Objective: Vitals last 24 hrs: Vitals:   07/24/22 0400 07/24/22 0605 07/24/22 0807 07/24/22 0845  BP:  111/64 127/68   Pulse: 79 71     Resp: 18 17    Temp:  98.5 F (36.9 C) 97.8 F (36.6 C)   TempSrc:  Axillary Axillary   SpO2: 100% 100%  100%  Weight:  60.3 kg    Height:       Weight change: 0.153 kg  Physical Examination: General exam: AA, weak,older appearing HEENT:Oral mucosa moist, Ear/Nose WNL grossly, dentition normal. Respiratory system: bilaterally diminished BS, no use of accessory muscle Cardiovascular system: S1 & S2 +, regular rate. Gastrointestinal system: Abdomen soft, NT,ND,BS+ Nervous System:Alert, awake, moving extremities and grossly nonfocal Extremities: LE ankle edema mild, lower extremities warm Skin: No rashes,no icterus. MSK: Normal muscle bulk,tone, power   Medications reviewed:  Scheduled Meds:  apixaban  2.5 mg Oral BID   atorvastatin  40 mg Oral Daily   escitalopram  10 mg Oral Daily   furosemide  40 mg Oral Daily   metoprolol tartrate  25 mg Oral BID   multivitamin with minerals  1 tablet Oral Daily   sodium chloride flush  3 mL Intravenous Q12H   tamsulosin  0.4 mg Oral QPC supper   umeclidinium bromide  1 puff Inhalation Daily   Continuous Infusions:    Diet Order             Diet Heart Room service appropriate? Yes; Fluid consistency: Thin  Diet effective now                  Intake/Output Summary (  Last 24 hours) at 07/24/2022 1111 Last data filed at 07/23/2022 2304 Gross per 24 hour  Intake 630.67 ml  Output 1810 ml  Net -1179.33 ml   Net IO Since Admission: -2,254.33 mL [07/24/22 1111]  Wt Readings from Last 3 Encounters:  07/24/22 60.3 kg  05/27/22 60.2 kg  05/14/22 60.2 kg    Unresulted Labs (From admission, onward)     Start     Ordered   07/22/22 0500  Basic metabolic panel  Daily,   R     Question:  Specimen collection method  Answer:  Lab=Lab collect   07/21/22 0744   07/22/22 0500  CBC  Daily,   R     Question:  Specimen collection method  Answer:  Lab=Lab collect   07/21/22 0744          Data Reviewed: I have personally reviewed  following labs and imaging studies CBC: Recent Labs  Lab 07/19/22 1619 07/19/22 1627 07/20/22 1354 07/21/22 0648 07/22/22 0136 07/23/22 0159 07/24/22 0141  WBC 10.4  --  12.8* 13.6* 14.5* 9.6 8.7  NEUTROABS 7.9*  --   --   --   --   --   --   HGB 10.2*   < > 10.6* 9.9* 10.8* 10.2* 10.0*  HCT 34.6*   < > 34.5* 32.6* 34.9* 34.2* 32.8*  MCV 100.9*  --  98.3 98.2 96.7 100.6* 100.3*  PLT 195  --  205 196 214 192 179   < > = values in this interval not displayed.   Basic Metabolic Panel: Recent Labs  Lab 07/21/22 0648 07/22/22 0136 07/22/22 1505 07/23/22 0159 07/24/22 0141  NA 136 137 139 138 139  K 3.9 3.3* 3.5 3.1* 3.9  CL 86* 88* 87* 89* 95*  CO2 37* 37* 40* 38* 33*  GLUCOSE 85 115* 138* 96 106*  BUN 31* 30* 26* 24* 29*  CREATININE 1.80* 1.71* 1.78* 1.83* 1.92*  CALCIUM 8.6* 8.6* 8.4* 8.5* 8.2*  MG  --  2.2  --   --  2.0  GFR: Estimated Creatinine Clearance: 21.4 mL/min (A) (by C-G formula based on SCr of 1.92 mg/dL (H)). Liver Function Tests: Recent Labs  Lab 07/19/22 1617  AST 37  ALT 72*  ALKPHOS 36*  BILITOT 0.4  PROT 6.7  ALBUMIN 4.5  No results for input(s): "LIPASE", "AMYLASE" in the last 168 hours. Recent Labs  Lab 07/19/22 1619  AMMONIA 28   No results for input(s): "TSH", "T4TOTAL", "FREET4", "T3FREE", "THYROIDAB" in the last 72 hours. Sepsis Labs: Recent Labs  Lab 07/19/22 1619  LATICACIDVEN 0.8    Recent Results (from the past 240 hour(s))  Blood culture (routine x 2)     Status: None   Collection Time: 07/19/22  4:15 PM   Specimen: BLOOD  Result Value Ref Range Status   Specimen Description   Final    BLOOD RIGHT ANTECUBITAL Performed at [facility], [address] [place], [place] [address]    Special Requests   Final    BOTTLES DRAWN AEROBIC AND ANAEROBIC Blood Culture adequate volume Performed at [facility], [address], [place], [place] [address]    Culture   Final    NO GROWTH 5  DAYS Performed at [facility], [address] [place], [place] [address]    Report Status 07/24/2022 FINAL  Final  Blood culture (routine x 2)     Status: None   Collection Time: 07/19/22  4:18 PM  Specimen: BLOOD  Result Value Ref Range Status   Specimen Description   Final    BLOOD LEFT ANTECUBITAL Performed at [facility], [address] [place], [place] [address]    Special Requests   Final    BOTTLES DRAWN AEROBIC AND ANAEROBIC Blood Culture adequate volume Performed at [facility], [address] [place], [place] [address]    Culture   Final    NO GROWTH 5 DAYS Performed at [facility], [address] [place], [place] [address]    Report Status 07/24/2022 FINAL  Final  SARS Coronavirus 2 by RT PCR (hospital order, performed in [facility] hospital lab) *cepheid single result test* Anterior Nasal Swab     Status: None   Collection Time: 07/19/22  4:19 PM   Specimen: Anterior Nasal Swab  Result Value Ref Range Status   SARS Coronavirus 2 by RT PCR NEGATIVE NEGATIVE Final    Comment: (NOTE) SARS-CoV-2 target nucleic acids are NOT DETECTED.  The SARS-CoV-2 RNA is generally detectable in upper and lower respiratory specimens during the acute phase of infection. The lowest concentration of SARS-CoV-2 viral copies this assay can detect is 250 copies / mL. A negative result does not preclude SARS-CoV-2 infection and should not be used as the sole basis for treatment or other patient management decisions.  A negative result may occur with improper specimen collection / handling, submission of specimen other than nasopharyngeal swab, presence of viral mutation(s) within the areas targeted by this assay, and inadequate number of viral copies (<250 copies / mL). A negative result must be combined with clinical observations, patient history, and epidemiological information.  Fact Sheet for Patients:    RoadLapTop.co.za  Fact Sheet for Healthcare Providers: http://kim-miller.com/  This test is not yet approved or  cleared by the [place] FDA and has been authorized for detection and/or diagnosis of SARS-CoV-2 by FDA under an Emergency Use Authorization (EUA).  This EUA will remain in effect (meaning this test can be used) for the duration of the COVID-19 declaration under Section 564(b)(1) of the Act, 21 U.S.C. section 360bbb-3(b)(1), unless the authorization is terminated or revoked sooner.  Performed at [facility], [address], [place], [place] [address]     Antimicrobials: Anti-infectives (From admission, onward)    Start     Dose/Rate Route Frequency Ordered Stop   07/20/22 1900  azithromycin (ZITHROMAX) 500 mg in sodium chloride 0.9 % 250 mL IVPB        500 mg 250 mL/hr over 60 Minutes Intravenous Every 24 hours 07/20/22 1208 07/23/22 1910   07/20/22 1300  cefTRIAXone (ROCEPHIN) 2 g in sodium chloride 0.9 % 100 mL IVPB        2 g 200 mL/hr over 30 Minutes Intravenous Every 24 hours 07/20/22 1208 07/24/22 0957   07/19/22 1845  cefTRIAXone (ROCEPHIN) 1 g in sodium chloride 0.9 % 100 mL IVPB        1 g 200 mL/hr over 30 Minutes Intravenous  Once 07/19/22 1837 07/19/22 2036   07/19/22 1845  azithromycin (ZITHROMAX) 500 mg in sodium chloride 0.9 % 250 mL IVPB        500 mg 250 mL/hr over 60 Minutes Intravenous  Once 07/19/22 1837 07/19/22 2036      Culture/Microbiology    Component Value Date/Time   SDES  07/19/2022 1618    BLOOD LEFT ANTECUBITAL Performed at [facility], [address] [place], [place] [address]  SPECREQUEST  07/19/2022 1618    BOTTLES DRAWN AEROBIC AND ANAEROBIC Blood Culture adequate volume Performed at [facility], [address] [place], [place] [address]    CULT  07/19/2022 1618    NO GROWTH 5 DAYS Performed at [facility], [address] [place], [place] [address]    REPTSTATUS 07/24/2022 FINAL 07/19/2022 1618  Radiology Studies: No results found.   LOS: 4 days   Lanae Boast, MD [facility]  07/24/2022, 11:11 AM

## 2022-07-25 DIAGNOSIS — J9622 Acute and chronic respiratory failure with hypercapnia: Secondary | ICD-10-CM | POA: Diagnosis not present

## 2022-07-25 DIAGNOSIS — I509 Heart failure, unspecified: Secondary | ICD-10-CM | POA: Diagnosis not present

## 2022-07-25 DIAGNOSIS — J9621 Acute and chronic respiratory failure with hypoxia: Secondary | ICD-10-CM | POA: Diagnosis not present

## 2022-07-25 DIAGNOSIS — I4892 Unspecified atrial flutter: Secondary | ICD-10-CM | POA: Diagnosis not present

## 2022-07-25 LAB — BASIC METABOLIC PANEL
Anion gap: 8 (ref 5–15)
BUN: 31 mg/dL — ABNORMAL HIGH (ref 8–23)
CO2: 33 mmol/L — ABNORMAL HIGH (ref 22–32)
Calcium: 8 mg/dL — ABNORMAL LOW (ref 8.9–10.3)
Chloride: 92 mmol/L — ABNORMAL LOW (ref 98–111)
Creatinine, Ser: 1.98 mg/dL — ABNORMAL HIGH (ref 0.61–1.24)
GFR, Estimated: 31 mL/min — ABNORMAL LOW (ref >60)
Glucose, Bld: 106 mg/dL — ABNORMAL HIGH (ref 70–99)
Potassium: 3.7 mmol/L (ref 3.5–5.1)
Sodium: 133 mmol/L — ABNORMAL LOW (ref 135–145)

## 2022-07-25 LAB — CBC
HCT: 30.8 % — ABNORMAL LOW (ref 39.0–52.0)
Hemoglobin: 9.4 g/dL — ABNORMAL LOW (ref 13.0–17.0)
MCH: 30.3 pg (ref 26.0–34.0)
MCHC: 30.5 g/dL (ref 30.0–36.0)
MCV: 99.4 fL (ref 80.0–100.0)
Platelets: 179 10*3/uL (ref 150–400)
RBC: 3.1 MIL/uL — ABNORMAL LOW (ref 4.22–5.81)
RDW: 13.3 % (ref 11.5–15.5)
WBC: 11.6 10*3/uL — ABNORMAL HIGH (ref 4.0–10.5)
nRBC: 0 % (ref 0.0–0.2)

## 2022-07-25 MED ORDER — METOPROLOL TARTRATE 5 MG/5ML IV SOLN: 1.0000 mg | Freq: Once | INTRAVENOUS | Status: DC

## 2022-07-25 MED ORDER — METOPROLOL TARTRATE 12.5 MG HALF TABLET
12.5000 mg | ORAL_TABLET | Freq: Four times a day (QID) | ORAL | Status: DC
  Administered 2022-07-25 (×2): 12.5 mg via ORAL
  Filled 2022-07-25 (×2): qty 1

## 2022-07-25 NOTE — Progress Notes (Signed)
Rounding Note    Patient Name: Gerald Valdez Date of Encounter: 07/25/2022  [facility] Cardiologist: Kristeen Miss, MD   Subjective   Awake and alert, his wife is at bedside. He is eating breakfast. He is asymptomatic  Inpatient Medications    Scheduled Meds:  apixaban  2.5 mg Oral BID   atorvastatin  40 mg Oral Daily   escitalopram  10 mg Oral Daily   furosemide  40 mg Oral Daily   metoprolol tartrate  12.5 mg Oral BID   multivitamin with minerals  1 tablet Oral Daily   sodium chloride flush  3 mL Intravenous Q12H   tamsulosin  0.4 mg Oral QPC supper   umeclidinium bromide  1 puff Inhalation Daily   Continuous Infusions:   PRN Meds: ipratropium-albuterol, polyethylene glycol   Vital Signs    Vitals:   07/24/22 2300 07/25/22 0333 07/25/22 0500 07/25/22 0812  BP: (!) 94/53 (!) 96/58 106/67 99/63  Pulse: 90 89 89 90  Resp: 20 15 19 18   Temp:  98.2 F (36.8 C)  98 F (36.7 C)  TempSrc:  Oral  Oral  SpO2: 100% 100% 100% 100%  Weight:  60.2 kg    Height:        Intake/Output Summary (Last 24 hours) at 07/25/2022 0911 Last data filed at 07/25/2022 1610 Gross per 24 hour  Intake --  Output 1350 ml  Net -1350 ml      07/25/2022    3:33 AM 07/24/2022    6:05 AM 07/23/2022    2:56 AM  Last 3 Weights  Weight (lbs) 132 lb 11.5 oz 132 lb 15 oz 132 lb 9.6 oz  Weight (kg) 60.2 kg 60.3 kg 60.147 kg      Telemetry    Atrial flutter RVR, up to 130s this AM.- Personally Reviewed  ECG   No new tracings. . - Personally Reviewed  Physical Exam   GEN: Sleeping, difficult to arouse. On Bipap. Neck: + mild JVD Cardiac: , tachycardic irregular. Respiratory: Diminished breath sounds, GI: Soft, nontender, non-distended  MS: no edema  Neuro:  Nonfocal  Psych: Normal affect   Labs    High Sensitivity Troponin:   Recent Labs  Lab 07/19/22 1619 07/21/22 1001 07/21/22 1201  TROPONINIHS 42* 47* 47*     Chemistry Recent Labs  Lab 07/19/22 1617  07/19/22 1619 07/22/22 0136 07/22/22 1505 07/23/22 0159 07/24/22 0141 07/25/22 0224  NA  --    < > 137   < > 138 139 133*  K  --    < > 3.3*   < > 3.1* 3.9 3.7  CL  --    < > 88*   < > 89* 95* 92*  CO2  --    < > 37*   < > 38* 33* 33*  GLUCOSE  --    < > 115*   < > 96 106* 106*  BUN  --    < > 30*   < > 24* 29* 31*  CREATININE  --    < > 1.71*   < > 1.83* 1.92* 1.98*  CALCIUM  --    < > 8.6*   < > 8.5* 8.2* 8.0*  MG  --   --  2.2  --   --  2.0  --   PROT 6.7  --   --   --   --   --   --   ALBUMIN 4.5  --   --   --   --   --   --  AST 37  --   --   --   --   --   --   ALT 72*  --   --   --   --   --   --   ALKPHOS 36*  --   --   --   --   --   --   BILITOT 0.4  --   --   --   --   --   --   GFRNONAA  --    < > 37*   < > 34* 32* 31*  ANIONGAP  --    < > 12   < > 11 11 8    < > = values in this interval not displayed.    Lipids No results for input(s): "CHOL", "TRIG", "HDL", "LABVLDL", "LDLCALC", "CHOLHDL" in the last 168 hours.  Hematology Recent Labs  Lab 07/23/22 0159 07/24/22 0141 07/25/22 0224  WBC 9.6 8.7 11.6*  RBC 3.40* 3.27* 3.10*  HGB 10.2* 10.0* 9.4*  HCT 34.2* 32.8* 30.8*  MCV 100.6* 100.3* 99.4  MCH 30.0 30.6 30.3  MCHC 29.8* 30.5 30.5  RDW 13.6 13.4 13.3  PLT 192 179 179   Thyroid  Recent Labs  Lab 07/19/22 1619  TSH 2.145    BNP Recent Labs  Lab 07/19/22 1619  BNP 644.6*    DDimer No results for input(s): "DDIMER" in the last 168 hours.   Radiology    No results found.  Cardiac Studies   Echocardiogram 07/21/2022 1. Left ventricular ejection fraction, by estimation, is 45 to 50%. The  left ventricle has mildly decreased function. The left ventricle has no  regional wall motion abnormalities. There is mild left ventricular  hypertrophy. Left ventricular diastolic  parameters are indeterminate.   2. Right ventricular systolic function is normal. The right ventricular  size is mildly enlarged.   3. Right atrial size was mildly dilated.    4. A small pericardial effusion is present. The pericardial effusion is  anterior to the right ventricle. There is no evidence of cardiac  tamponade.   5. The mitral valve is degenerative. Mild to moderate mitral valve  regurgitation.   6. The aortic valve was not well visualized. Aortic valve regurgitation  is not visualized.   Comparison(s): Prior images reviewed side by side. LVEF appears slightly  decreased; this study is more technically difficult than prior.   Patient Profile     89 y.o. male with a hx of coronary calcifications, hyperlipidemia, hypertension, COPD, bladder cancer, chronic respiratory failure with hypoxia, anemia, CKD stage IIIa, who was admitted 07/21/2022 for the evaluation of atrial flutter.  Upon presentation patient was severely hypercapnic and required BiPAP, now on nasal cannula 3 L.  Has underlying COPD.  Assessment & Plan    New onset atrial flutter with variable conduction   Patient presenting with somnolence/altered mental status and found to be severely hypercapnic with underlying COPD.  This is in the setting of recent urinary infection and pneumonia.   - has been predominantly rate controlled 4:1 atrial flutter, now rates in 130s - IV metop x1 - will increase the frequency of his metoprolol to 12.5 mg Q6H  - if rates continue to be 130s, can start IV amiodarone - can consider outpatient DCCV if he does not convert in 3 weeks on [facility] - continue eliquis     Acute heart failure with moderately reduced EF Prior echocardiogram showing normal LVEF of 55 to 60% in 2022.  Echo  on this admission shown 45 to 50%.  Likely tachycardia mediated. BNP 644 during this admission with signs of congestion as noted above.  Also had small pericardial effusion with no tamponade physiology.  Euvomelic to dry, may be contributing to his flutter with RVR, will hold his lasix S/p acetazolamide 500mg  with metabolic alkalosis and respiratory acidosis Do not anticipate invasive  ischemic eval given comorbidities.    Stable Conditions  Coronary calcifications noted on CT in 2022 Hyperlipidemia No anginal symptoms.  Continue to monitor.  Continue statin   Demand ischemia  Has slightly elevated troponins that are flat at 47-47.  Likely due to demand ischemia.   Hypertension PTA amlodipine 5 mg daily.  Continue to hold given greater need for rate control and soft pressures.  Mild to moderate MR Continue to monitor, follow-up outpatient.   COPD Pneumonia Hypercapnia- appears tenuous and no is back on BIPAP. CCM following.  CKD stage IIIa difficult to measure baseline.  However creatinine appears to be around 1.8   Cardiology will follow peripherally  For questions or updates, please contact [facility] Please consult [email or web address] for contact info under        Signed, Maisie Fus, MD  07/25/2022, 9:11 AM    Maisie Fus, MD

## 2022-07-25 NOTE — Progress Notes (Signed)
   Palliative Medicine Inpatient Follow Up Note HPI: 89 year old man with history significant of bladder cancer, chronic respiratory failure with hypoxia, COPD, HTN, GERD, HLD, h/o anemia, CKD 3a, malnutrition  with UTI ( since last Wednesday, on antibiotics) presented with somnolence and confusion, likely due to his wife's stopping his antibiotics due to concern of drug effect. On last Friday more sleepy and on Sunday could no wake him up. Admitted for RLL PNA. Palliative care has been asked to get involved for further conversations related to code status.   Today's Discussion 07/25/2022  *Please note that this is a verbal dictation therefore any spelling or grammatical errors are due to the "Dragon Medical One" system interpretation.  Chart reviewed inclusive of vital signs, progress notes, laboratory results, and diagnostic images.   I met with Nikolas and his spouse, Corrie Dandy at bedside. We reviewed his present health state, Discussed improvements he has made in the setting of his PNA. Created space and opportunity for patient to explore thoughts feelings and fears regarding current medical situation. Patients spouse continues to hope for ongoing improvements.  Corrie Dandy shares that she plans to speak to their children about the MOST form prior to complete. We discussed that this can be done at their leisure.   Questions and concerns addressed/Palliative Support Provided.   Objective Assessment: Vital Signs Vitals:   07/25/22 1000 07/25/22 1100  BP: (!) 94/58 (!) 108/55  Pulse: 92 88  Resp:  20  Temp:  98.6 F (37 C)  SpO2:  100%    Intake/Output Summary (Last 24 hours) at 07/25/2022 1201 Last data filed at 07/25/2022 1010 Gross per 24 hour  Intake 240 ml  Output 1350 ml  Net -1110 ml   Last Weight  Most recent update: 07/25/2022  3:34 AM    Weight  60.2 kg (132 lb 11.5 oz)            Gen:  Elderly caucasian M in NAD HEENT: moist mucous membranes CV: Regular rate and rhythm,   PULM: ON 3LPM [place], breathing is even and nonlabored   ABD: soft/nontender  EXT: No edema  Neuro: Alert and oriented x2  SUMMARY OF RECOMMENDATIONS   DNAR/DNI   Provided a MOST form --> Patients spouse would like to review with her children   Continue present scope of care allowing time for improvements   Patients spouse would like him to go home with [facility] post hospitalization   Ongoing incremental PMT support  Billing based on MDM: Moderate ______________________________________________________________________________________ Lamarr Lulas [place] Palliative Medicine Team Team Cell Phone: [phone] Please utilize secure chat with additional questions, if there is no response within 30 minutes please call the above phone number  Palliative Medicine Team providers are available by phone from 7am to 7pm daily and can be reached through the team cell phone.  Should this patient require assistance outside of these hours, please call the patient's attending physician.

## 2022-07-25 NOTE — Progress Notes (Signed)
PROGRESS NOTE Gerald Valdez  WUJ:[id number] DOB: 06-13-1928 DOA: 07/19/2022 PCP: [name], MD  Brief Narrative/Hospital Course: 89 year old man with history significant of bladder cancer, chronic respiratory failure with hypoxia, COPD, HTN, GERD, HLD, h/o anemia, CKD 3a, malnutrition  with UTI ( since last Wednesday, on antibiotics) presented with somnolence and confusion, likely due to his wife's stopping his antibiotics due to concern of drug effect. On last Friday more sleepy and on Sunday could no wake him up. In the ED found to have severe hypercapnia with pCO2 101, chest x-ray possible  RLL pneumonia w/ small parapneumonic effusion, creatinine elevated 1.9 stable above baseline of 1.3 BNP 644 troponin 42, WBC 10.4 and he has a macrocytic anemia, H/H 10 and 34. TSH was 2.145 , COVID-negative EKG showed a flutter with PVC.  Patient was placed on BiPAP, further is mentation improving.  Patient admitted. Patient went into a flutter with RVR with minimal activity cardiology was consulted> starting on Eliquis with plan for TEE/DCCV if difficulty rate control.  Also started on IV Lasix for acute CHF> patient also needed BiPAP during bedtime seen by pulmonary. Now transition to oral Lasix.  Seen by palliative care changed to DNR/DNI.   Subjective: Seen this am Eating, wife at bedside- thinks he is back to his normal this am Overnight afebrile, BP had been soft yesterday  On 3 to nasal cannula home setting  Did not use bipap last night  Assessment and Plan: Principal Problem:   Acute on chronic respiratory failure with hypoxia and hypercapnia (HCC) Active Problems:   COPD (chronic obstructive pulmonary disease) (HCC)   Essential hypertension   Hyperlipidemia   Cancer of overlapping sites of bladder (HCC)   AKI (acute kidney injury) (HCC)   Stage 3a chronic kidney disease (CKD) (HCC)   Protein-calorie malnutrition, moderate (HCC)   UTI (urinary tract infection)   Chronic respiratory  failure with hypoxia (HCC)   Atrial flutter with rapid ventricular response (HCC)   Acute heart failure (HCC)   Acute on chronic respiratory failure with hypoxia and hypercapnia Suspected COPD-long standing remote smoking history RLL pneumonia with possible parapneumonic effusion: S/P BiPAP initially then was on [place] 3l(home setting),being managed on IV antibiotics for community-acquired pneumonia, iv lasix . On 6/13 became lethargic with ABG still showing hypercapnia PCCM consulted> resumed BiPAP then switched to bedtime, which he is tolerating. Completed antibiotics. Continue oral Lasix,bronchodilators/nebs.S/p Diamox and bicarb improved. Will likely need home BiPAP-await further pulmonary input.  Acute metabolic encephalopathy due to hypercapnia Mild cognitive impairment -suspect mild dementia at baseline Mentation much improved ,aaox3. Did not use BiPAP last night. Cont PT OT, supportive care delirium precaution.  Recent UTI, on antibiotics outpatient UA unremarkable  Essential hypertension: BP soft 6/14> on Lasix and metoprolol. Amlodipine on hold  Hyperlipidemia: cont Crestor  New onset A flutter with variable AV block w/ RVR: EF 45-50% no RWMA small pericardial effusion.TSH stable.Started on metoprolol and Eliquis, this am SVT in 130s HR- notified cardiology.  Acute CHF w.  Moderately reduced EF: LVEF 45-50% on echo. Seen by cardiology, continue oral Lasix, metoprolol GDMT per cardiology  Cont to monitor daily I/O,weight, electrolytes and net balance as below.Keep on  salt/fluid restricted diet and monitor in tele. Net IO Since Admission: -3,604.33 mL [07/25/22 0857]  Filed Weights   07/23/22 0256 07/24/22 0605 07/25/22 0333  Weight: 60.1 kg 60.3 kg 60.2 kg    Urine retention overnight needing in and out cath: Monitor postvoid residual residue, may need  foley catheter if recurrent. Added on Flomax  Demand ischemia in the setting of CHF and A-flutter: Plan for  cardiology Mild to moderate MR follow-up outpatient  Normocytic anemia: HB stable. was macrocytic, now normocytic.  Likely related to chronic issues.No evidence of blood loss hemoglobin stable, avoid anticoagulation. Has known bladder cancer.  Recent Labs  Lab 07/21/22 0648 07/22/22 0136 07/23/22 0159 07/24/22 0141 07/25/22 0224  HGB 9.9* 10.8* 10.2* 10.0* 9.4*  HCT 32.6* 34.9* 34.2* 32.8* 30.8*   Cancer of overlapping sites of bladder:he is due for outpatient follow up this week - may need to be rescheduled   Hypokalemia:Resolved  AKI on CKD 3a: Likely in the setting of patient's infection, dehydration. In ED s/p lasix, bnp was high. Creatinine holding stable as below monitor while on diuretics.   Recent Labs    12/10/21 1155 05/14/22 0930 07/19/22 1619 07/20/22 1354 07/21/22 0648 07/22/22 0136 07/22/22 1505 07/23/22 0159 07/24/22 0141 07/25/22 0224  BUN 24* 31* 34*  --  31* 30* 26* 24* 29* 31*  CREATININE 1.33* 1.24 1.97* 1.92* 1.80* 1.71* 1.78* 1.83* 1.92* 1.98*  CO2 31 35* 38*  --  37* 37* 40* 38* 33* 33*   Inadequate oral intake, at risk of malnutrition  Nutrition Problem: Inadequate oral intake Etiology: inability to eat Signs/Symptoms: NPO status Interventions: Refer to RD note for recommendations, MVI, Ensure Enlive (each supplement provides 350kcal and 20 grams of protein)  Goals of care patient with advanced age and multiple medical comorbidities, palliative care consultation was requested now changed to DNR/DNI overall prognosis does not appear bright.  Continue on full scope of treatment  DVT prophylaxis: apixaban (ELIQUIS) tablet 2.5 mg Start: 07/21/22 2200 Code Status:   Code Status: DNR Family Communication: plan of care discussed with patient/wife at bedside. Patient status is: Inpatient because of hypercapnic respiratory failure  Level of care: Progressive   Dispo: The patient is from: Home with wife            Anticipated disposition: SNF 1 day   Objective: Vitals last 24 hrs: Vitals:   07/24/22 2300 07/25/22 0333 07/25/22 0500 07/25/22 0812  BP: (!) 94/53 (!) 96/58 106/67 99/63  Pulse: 90 89 89 90  Resp: 20 15 19 18   Temp:  98.2 F (36.8 C)  98 F (36.7 C)  TempSrc:  Oral  Oral  SpO2: 100% 100% 100% 100%  Weight:  60.2 kg    Height:       Weight change: -0.1 kg  Physical Examination: General exam: AAox3, weak,older appearing HEENT:Oral mucosa moist, Ear/Nose WNL grossly, dentition normal. Respiratory system: bilaterally diminished BS, no use of accessory muscle Cardiovascular system: S1 & S2 +, regular rate but fast . Gastrointestinal system: Abdomen soft, NT,ND,BS+ Nervous System:Alert, awake, moving extremities and grossly nonfocal Extremities: LE ankle edema neg, lower extremities warm Skin:No rashes,no icterus. EAV:WUJWJX muscle bulk,tone, power   Medications reviewed:  Scheduled Meds:  apixaban  2.5 mg Oral BID   atorvastatin  40 mg Oral Daily   escitalopram  10 mg Oral Daily   furosemide  40 mg Oral Daily   metoprolol tartrate  12.5 mg Oral BID   multivitamin with minerals  1 tablet Oral Daily   sodium chloride flush  3 mL Intravenous Q12H   tamsulosin  0.4 mg Oral QPC supper   umeclidinium bromide  1 puff Inhalation Daily   Continuous Infusions:    Diet Order             Diet  Heart Room service appropriate? Yes; Fluid consistency: Thin  Diet effective now                  Intake/Output Summary (Last 24 hours) at 07/25/2022 0857 Last data filed at 07/25/2022 0614 Gross per 24 hour  Intake --  Output 1350 ml  Net -1350 ml   Net IO Since Admission: -3,604.33 mL [07/25/22 0857]  Wt Readings from Last 3 Encounters:  07/25/22 60.2 kg  05/27/22 60.2 kg  05/14/22 60.2 kg    Unresulted Labs (From admission, onward)     Start     Ordered   07/22/22 0500  Basic metabolic panel  Daily,   R     Question:  Specimen collection method  Answer:  Lab=Lab collect   07/21/22 0744   07/22/22 0500   CBC  Daily,   R     Question:  Specimen collection method  Answer:  Lab=Lab collect   07/21/22 0744          Data Reviewed: I have personally reviewed following labs and imaging studies CBC: Recent Labs  Lab 07/19/22 1619 07/19/22 1627 07/21/22 0648 07/22/22 0136 07/23/22 0159 07/24/22 0141 07/25/22 0224  WBC 10.4   < > 13.6* 14.5* 9.6 8.7 11.6*  NEUTROABS 7.9*  --   --   --   --   --   --   HGB 10.2*   < > 9.9* 10.8* 10.2* 10.0* 9.4*  HCT 34.6*   < > 32.6* 34.9* 34.2* 32.8* 30.8*  MCV 100.9*   < > 98.2 96.7 100.6* 100.3* 99.4  PLT 195   < > 196 214 192 179 179   < > = values in this interval not displayed.   Basic Metabolic Panel: Recent Labs  Lab 07/22/22 0136 07/22/22 1505 07/23/22 0159 07/24/22 0141 07/25/22 0224  NA 137 139 138 139 133*  K 3.3* 3.5 3.1* 3.9 3.7  CL 88* 87* 89* 95* 92*  CO2 37* 40* 38* 33* 33*  GLUCOSE 115* 138* 96 106* 106*  BUN 30* 26* 24* 29* 31*  CREATININE 1.71* 1.78* 1.83* 1.92* 1.98*  CALCIUM 8.6* 8.4* 8.5* 8.2* 8.0*  MG 2.2  --   --  2.0  --   GFR: Estimated Creatinine Clearance: 20.7 mL/min (A) (by C-G formula based on SCr of 1.98 mg/dL (H)). Liver Function Tests: Recent Labs  Lab 07/19/22 1617  AST 37  ALT 72*  ALKPHOS 36*  BILITOT 0.4  PROT 6.7  ALBUMIN 4.5  No results for input(s): "LIPASE", "AMYLASE" in the last 168 hours. Recent Labs  Lab 07/19/22 1619  AMMONIA 28   No results for input(s): "TSH", "T4TOTAL", "FREET4", "T3FREE", "THYROIDAB" in the last 72 hours. Sepsis Labs: Recent Labs  Lab 07/19/22 1619  LATICACIDVEN 0.8    Recent Results (from the past 240 hour(s))  Blood culture (routine x 2)     Status: None   Collection Time: 07/19/22  4:15 PM   Specimen: BLOOD  Result Value Ref Range Status   Specimen Description   Final    BLOOD RIGHT ANTECUBITAL Performed at [facility], [address] [place], [place] [address]    Special Requests   Final    BOTTLES DRAWN AEROBIC AND  ANAEROBIC Blood Culture adequate volume Performed at [facility], [address] [place], [place] [address]    Culture   Final    NO GROWTH 5 DAYS Performed at [facility], [address],  [place], [place] [address]    Report Status 07/24/2022 FINAL  Final  Blood culture (routine x 2)     Status: None   Collection Time: 07/19/22  4:18 PM   Specimen: BLOOD  Result Value Ref Range Status   Specimen Description   Final    BLOOD LEFT ANTECUBITAL Performed at [facility], [address], [place], [place] [address]    Special Requests   Final    BOTTLES DRAWN AEROBIC AND ANAEROBIC Blood Culture adequate volume Performed at [facility], [address] [place], [place] [address]    Culture   Final    NO GROWTH 5 DAYS Performed at [facility], [address], [place], [place] [address]    Report Status 07/24/2022 FINAL  Final  SARS Coronavirus 2 by RT PCR (hospital order, performed in [facility] hospital lab) *cepheid single result test* Anterior Nasal Swab     Status: None   Collection Time: 07/19/22  4:19 PM   Specimen: Anterior Nasal Swab  Result Value Ref Range Status   SARS Coronavirus 2 by RT PCR NEGATIVE NEGATIVE Final    Comment: (NOTE) SARS-CoV-2 target nucleic acids are NOT DETECTED.  The SARS-CoV-2 RNA is generally detectable in upper and lower respiratory specimens during the acute phase of infection. The lowest concentration of SARS-CoV-2 viral copies this assay can detect is 250 copies / mL. A negative result does not preclude SARS-CoV-2 infection and should not be used as the sole basis for treatment or other patient management decisions.  A negative result may occur with improper specimen collection / handling, submission of specimen other than nasopharyngeal swab, presence of viral mutation(s) within the areas targeted by this assay, and inadequate number of viral copies (<250 copies /  mL). A negative result must be combined with clinical observations, patient history, and epidemiological information.  Fact Sheet for Patients:   RoadLapTop.co.za  Fact Sheet for Healthcare Providers: http://kim-miller.com/  This test is not yet approved or  cleared by the [place] FDA and has been authorized for detection and/or diagnosis of SARS-CoV-2 by FDA under an Emergency Use Authorization (EUA).  This EUA will remain in effect (meaning this test can be used) for the duration of the COVID-19 declaration under Section 564(b)(1) of the Act, 21 U.S.C. section 360bbb-3(b)(1), unless the authorization is terminated or revoked sooner.  Performed at [facility], [address], [place], [place] [address]     Antimicrobials: Anti-infectives (From admission, onward)    Start     Dose/Rate Route Frequency Ordered Stop   07/20/22 1900  azithromycin (ZITHROMAX) 500 mg in sodium chloride 0.9 % 250 mL IVPB        500 mg 250 mL/hr over 60 Minutes Intravenous Every 24 hours 07/20/22 1208 07/23/22 1910   07/20/22 1300  cefTRIAXone (ROCEPHIN) 2 g in sodium chloride 0.9 % 100 mL IVPB        2 g 200 mL/hr over 30 Minutes Intravenous Every 24 hours 07/20/22 1208 07/24/22 1027   07/19/22 1845  cefTRIAXone (ROCEPHIN) 1 g in sodium chloride 0.9 % 100 mL IVPB        1 g 200 mL/hr over 30 Minutes Intravenous  Once 07/19/22 1837 07/19/22 2036   07/19/22 1845  azithromycin (ZITHROMAX) 500 mg in sodium chloride 0.9 % 250 mL IVPB        500 mg 250 mL/hr over 60 Minutes Intravenous  Once 07/19/22 1837 07/19/22 2036  Culture/Microbiology    Component Value Date/Time   SDES  07/19/2022 1618    BLOOD LEFT ANTECUBITAL Performed at [facility], [address] [place], [place] [address]    [facility]  07/19/2022 1618    BOTTLES DRAWN AEROBIC AND ANAEROBIC Blood Culture adequate volume Performed at [facility], [address], [place], [place] [address]    CULT  07/19/2022 1618    NO GROWTH 5 DAYS Performed at [facility], [address], [place], [place] [address]    REPTSTATUS 07/24/2022 FINAL 07/19/2022 1618  Radiology Studies: No results found.   LOS: 5 days   Lanae Boast, MD [facility]  07/25/2022, 8:57 AM

## 2022-07-26 ENCOUNTER — Other Ambulatory Visit (HOSPITAL_COMMUNITY): Payer: Self-pay

## 2022-07-26 DIAGNOSIS — I483 Typical atrial flutter: Secondary | ICD-10-CM | POA: Diagnosis not present

## 2022-07-26 DIAGNOSIS — J9621 Acute and chronic respiratory failure with hypoxia: Secondary | ICD-10-CM | POA: Diagnosis not present

## 2022-07-26 DIAGNOSIS — J9622 Acute and chronic respiratory failure with hypercapnia: Secondary | ICD-10-CM | POA: Diagnosis not present

## 2022-07-26 LAB — BLOOD GAS, ARTERIAL
Acid-Base Excess: 8.7 mmol/L — ABNORMAL HIGH (ref 0.0–2.0)
Bicarbonate: 36.4 mmol/L — ABNORMAL HIGH (ref 20.0–28.0)
Drawn by: 53547
O2 Saturation: 97.1 %
Patient temperature: 36.5
pCO2 arterial: 62 mmHg — ABNORMAL HIGH (ref 32–48)
pH, Arterial: 7.38 (ref 7.35–7.45)
pO2, Arterial: 69 mmHg — ABNORMAL LOW (ref 83–108)

## 2022-07-26 LAB — CBC
HCT: 33.2 % — ABNORMAL LOW (ref 39.0–52.0)
Hemoglobin: 9.9 g/dL — ABNORMAL LOW (ref 13.0–17.0)
MCH: 29.1 pg (ref 26.0–34.0)
MCHC: 29.8 g/dL — ABNORMAL LOW (ref 30.0–36.0)
MCV: 97.6 fL (ref 80.0–100.0)
Platelets: 174 10*3/uL (ref 150–400)
RBC: 3.4 MIL/uL — ABNORMAL LOW (ref 4.22–5.81)
RDW: 13.2 % (ref 11.5–15.5)
WBC: 12 10*3/uL — ABNORMAL HIGH (ref 4.0–10.5)
nRBC: 0 % (ref 0.0–0.2)

## 2022-07-26 LAB — BASIC METABOLIC PANEL
Anion gap: 13 (ref 5–15)
BUN: 25 mg/dL — ABNORMAL HIGH (ref 8–23)
CO2: 30 mmol/L (ref 22–32)
Calcium: 8.3 mg/dL — ABNORMAL LOW (ref 8.9–10.3)
Chloride: 90 mmol/L — ABNORMAL LOW (ref 98–111)
Creatinine, Ser: 1.62 mg/dL — ABNORMAL HIGH (ref 0.61–1.24)
GFR, Estimated: 40 mL/min — ABNORMAL LOW (ref >60)
Glucose, Bld: 111 mg/dL — ABNORMAL HIGH (ref 70–99)
Potassium: 3.9 mmol/L (ref 3.5–5.1)
Sodium: 133 mmol/L — ABNORMAL LOW (ref 135–145)

## 2022-07-26 MED ORDER — UMECLIDINIUM-VILANTEROL 62.5-25 MCG/ACT IN AEPB
1.0000 | INHALATION_SPRAY | Freq: Every day | RESPIRATORY_TRACT | Status: DC
  Filled 2022-07-26: qty 14

## 2022-07-26 MED ORDER — ENSURE ENLIVE PO LIQD
237.0000 mL | Freq: Two times a day (BID) | ORAL | Status: DC
  Administered 2022-07-26 – 2022-07-27 (×2): 237 mL via ORAL

## 2022-07-26 MED ORDER — METOPROLOL TARTRATE 25 MG PO TABS
25.0000 mg | ORAL_TABLET | Freq: Two times a day (BID) | ORAL | Status: DC
  Administered 2022-07-26 – 2022-07-27 (×2): 25 mg via ORAL
  Filled 2022-07-26 (×3): qty 1

## 2022-07-26 MED ORDER — UMECLIDINIUM-VILANTEROL 62.5-25 MCG/ACT IN AEPB
1.0000 | INHALATION_SPRAY | Freq: Every day | RESPIRATORY_TRACT | Status: DC
  Administered 2022-07-27: 1 via RESPIRATORY_TRACT
  Filled 2022-07-26: qty 14

## 2022-07-26 NOTE — Progress Notes (Signed)
Rounding Note    Patient Name: Gerald Valdez Date of Encounter: 07/26/2022   [facility] Cardiologist: Kristeen Miss, MD   Subjective   No CP or dyspnea  Inpatient Medications    Scheduled Meds:  apixaban  2.5 mg Oral BID   atorvastatin  40 mg Oral Daily   escitalopram  10 mg Oral Daily   metoprolol tartrate  12.5 mg Oral Q6H   multivitamin with minerals  1 tablet Oral Daily   sodium chloride flush  3 mL Intravenous Q12H   tamsulosin  0.4 mg Oral QPC supper   umeclidinium bromide  1 puff Inhalation Daily   Continuous Infusions:  PRN Meds: ipratropium-albuterol, polyethylene glycol   Vital Signs    Vitals:   07/25/22 2351 07/26/22 0442 07/26/22 0449 07/26/22 0724  BP: (!) 96/57 (!) 60/39 99/60   Pulse: 88 88    Resp: 20 (!) 22    Temp: 97.7 F (36.5 C) 98.2 F (36.8 C)    TempSrc: Oral Oral    SpO2: 100% 100%  100%  Weight:   60.9 kg   Height:        Intake/Output Summary (Last 24 hours) at 07/26/2022 0755 Last data filed at 07/26/2022 0447 Gross per 24 hour  Intake 480 ml  Output 1125 ml  Net -645 ml      07/26/2022    4:49 AM 07/25/2022    3:33 AM 07/24/2022    6:05 AM  Last 3 Weights  Weight (lbs) 134 lb 4.2 oz 132 lb 11.5 oz 132 lb 15 oz  Weight (kg) 60.9 kg 60.2 kg 60.3 kg      Telemetry    Atrial flutter rate controlled - Personally Reviewed   Physical Exam   GEN: No acute distress.  Frail Neck: No JVD Cardiac: RRR, no murmurs, rubs, or gallops.  Respiratory: Diminished BS throughout GI: Soft, nontender, non-distended  MS: No edema Neuro:  Nonfocal  Psych: Normal affect   Labs    High Sensitivity Troponin:   Recent Labs  Lab 07/19/22 1619 07/21/22 1001 07/21/22 1201  TROPONINIHS 42* 47* 47*     Chemistry Recent Labs  Lab 07/19/22 1617 07/19/22 1619 07/22/22 0136 07/22/22 1505 07/24/22 0141 07/25/22 0224 07/26/22 0213  NA  --    < > 137   < > 139 133* 133*  K  --    < > 3.3*   < > 3.9 3.7 3.9  CL  --     < > 88*   < > 95* 92* 90*  CO2  --    < > 37*   < > 33* 33* 30  GLUCOSE  --    < > 115*   < > 106* 106* 111*  BUN  --    < > 30*   < > 29* 31* 25*  CREATININE  --    < > 1.71*   < > 1.92* 1.98* 1.62*  CALCIUM  --    < > 8.6*   < > 8.2* 8.0* 8.3*  MG  --   --  2.2  --  2.0  --   --   PROT 6.7  --   --   --   --   --   --   ALBUMIN 4.5  --   --   --   --   --   --   AST 37  --   --   --   --   --   --  ALT 72*  --   --   --   --   --   --   ALKPHOS 36*  --   --   --   --   --   --   BILITOT 0.4  --   --   --   --   --   --   GFRNONAA  --    < > 37*   < > 32* 31* 40*  ANIONGAP  --    < > 12   < > 11 8 13    < > = values in this interval not displayed.     Hematology Recent Labs  Lab 07/24/22 0141 07/25/22 0224 07/26/22 0213  WBC 8.7 11.6* 12.0*  RBC 3.27* 3.10* 3.40*  HGB 10.0* 9.4* 9.9*  HCT 32.8* 30.8* 33.2*  MCV 100.3* 99.4 97.6  MCH 30.6 30.3 29.1  MCHC 30.5 30.5 29.8*  RDW 13.4 13.3 13.2  PLT 179 179 174   Thyroid  Recent Labs  Lab 07/19/22 1619  TSH 2.145    BNP Recent Labs  Lab 07/19/22 1619  BNP 644.6*     Patient Profile     89 y.o. male with a hx of coronary calcifications, hyperlipidemia, hypertension, COPD, bladder cancer, chronic respiratory failure with hypoxia, anemia, CKD stage IIIa, who was admitted 07/21/2022 for the evaluation of atrial flutter.  Upon presentation patient was severely hypercapnic and required BiPAP, now on nasal cannula 3 L.  Has underlying COPD.  Echocardiogram this admission shows ejection fraction 45 to 50%, mild left ventricular hypertrophy, mild right ventricular enlargement, mild right atrial margin, small pericardial effusion, mild to moderate mitral regurgitation.  Assessment & Plan    1 atrial flutter-heart rate appears to be controlled at present.  Will continue metoprolol but changed to 25 mg twice daily.  Continue apixaban.  Echocardiogram shows mildly reduced LV function.  Plan will be rate control and anticoagulation.   Could consider cardioversion as an outpatient but given severity of lung disease risk of recurrent atrial arrhythmias would be high.  2 CHF-patient does not appear to be volume overloaded on examination.  At discharge was treated with Lasix 20 mg daily by mouth as needed for weight gain of 2 to 3 pounds or lower extremity edema.  3 coronary calcification-continue statin.  4 hypertension-blood pressure is controlled with metoprolol.  Amlodipine discontinued previously.  5 COPD-managed by primary service.  6 chronic stage IIIa kidney disease-creatinine is unchanged today.  7 no CODE BLUE  Patient can be discharged from a cardiology standpoint.  Will arrange follow-up with APP 2 to 4 weeks after discharge.  For questions or updates, please contact [facility] Please consult [email or web address] for contact info under        Signed, Olga Millers, MD  07/26/2022, 7:55 AM

## 2022-07-26 NOTE — TOC Benefit Eligibility Note (Signed)
Pharmacy Patient Advocate Encounter  Insurance verification completed.    The patient is insured through  [facility]     Ran test claim for Anoro and the current 30 day co-pay is $45.00.  Ran test claim for Stiolto and the current 30 day co-pay is $45.00.  This test claim was processed through [facility]- copay amounts may vary at other pharmacies due to pharmacy/plan contracts, or as the patient moves through the different stages of their insurance plan.

## 2022-07-26 NOTE — Progress Notes (Addendum)
RE: Gerald Valdez  Date of Birth: 1928-09-25  Date: 07/26/2022  To Whom It May Concern:  Please be advised that the above-named patient will require a short-term nursing home stay - anticipated 30 days or less for rehabilitation and strengthening. The plan is for return home.

## 2022-07-26 NOTE — Progress Notes (Signed)
Physical Therapy Treatment Patient Details Name: Gerald Valdez MRN: [id number] DOB: 05-Apr-1928 Today's Date: 07/26/2022   History of Present Illness Patient is a 89 year old male who presented on 6/10 with increased somnolence and abdominal pain. Patient was admitted with early pneumonia, acute on chronic respiratory failure with hypoxia and hypercapnia, and possible a flutter. PMH: anxiety, bladder CA, GI bleed, COPD, emphysema of lung, CKD.    PT Comments    Pt supine in bed this session but moving to edge of bed with support from his daughter.  He was urgently trying to get up as he needed to have a BM.  During mobility HR elevated to 142 bpm, therefore further gt training deferred.  He returned to 126bpm after returning to supine.  Pt continues to improve slowly.     Recommendations for follow up therapy are one component of a multi-disciplinary discharge planning process, led by the attending physician.  Recommendations may be updated based on patient status, additional functional criteria and insurance authorization.  Follow Up Recommendations  Can patient physically be transported by private vehicle: No    Assistance Recommended at Discharge    Patient can return home with the following A lot of help with walking and/or transfers;A lot of help with bathing/dressing/bathroom;Assistance with cooking/housework;Direct supervision/assist for medications management;Assist for transportation;Help with stairs or ramp for entrance   Equipment Recommendations  Other (comment) (TBA at facility)    Recommendations for Other Services       Precautions / Restrictions Precautions Precautions: Other (comment);Fall Precaution Comments: monitor HR Restrictions Weight Bearing Restrictions: No     Mobility  Bed Mobility Overal bed mobility: Needs Assistance Bed Mobility: Supine to Sit, Sit to Supine     Supine to sit: Min assist Sit to supine: Min assist   General bed mobility  comments: Pt sitting edge of bed on arrival with assistance from his daughter.  He was able to return back to bed with minimal assistance.    Transfers Overall transfer level: Needs assistance Equipment used: Rolling walker (2 wheels) Transfers: Sit to/from Stand Sit to Stand: Min assist           General transfer comment: Cues for hand placement to and from seated surface.  Mild posterior lean this session.    Ambulation/Gait Ambulation/Gait assistance: Mod assist Gait Distance (Feet): 4 Feet (x2) Assistive device: Rolling walker (2 wheels) Gait Pattern/deviations: Step-to pattern, Trunk flexed, Shuffle       General Gait Details: Performed from bed to commode and commode back to bed.  Pt required cues for turning and backing.   Stairs             Wheelchair Mobility    Modified Rankin (Stroke Patients Only)       Balance Overall balance assessment: Needs assistance Sitting-balance support: Single extremity supported, Feet supported Sitting balance-Leahy Scale: Fair       Standing balance-Leahy Scale: Poor Standing balance comment: Reliant on RW                            Cognition Arousal/Alertness: Awake/alert Behavior During Therapy: WFL for tasks assessed/performed Overall Cognitive Status: Within Functional Limits for tasks assessed                                          Exercises  General Comments        Pertinent Vitals/Pain Pain Assessment Pain Assessment: No/denies pain    Home Living                          Prior Function            PT Goals (current goals can now be found in the care plan section) Acute Rehab PT Goals Patient Stated Goal: to go home Potential to Achieve Goals: Fair Progress towards PT goals: Progressing toward goals    Frequency    Min 1X/week      PT Plan Current plan remains appropriate    Co-evaluation              AM-PAC PT "6 Clicks"  Mobility   Outcome Measure  Help needed turning from your back to your side while in a flat bed without using bedrails?: A Little Help needed moving from lying on your back to sitting on the side of a flat bed without using bedrails?: A Little Help needed moving to and from a bed to a chair (including a wheelchair)?: A Little Help needed standing up from a chair using your arms (e.g., wheelchair or bedside chair)?: A Lot Help needed to walk in hospital room?: A Lot Help needed climbing 3-5 steps with a railing? : Total 6 Click Score: 14    End of Session Equipment Utilized During Treatment: Gait belt Activity Tolerance: Treatment limited secondary to medical complications (Comment) Patient left: in bed;with call bell/phone within reach;with bed alarm set Nurse Communication: Mobility status;Other (comment) (BP) PT Visit Diagnosis: Other abnormalities of gait and mobility (R26.89);Unsteadiness on feet (R26.81);Muscle weakness (generalized) (M62.81);Difficulty in walking, not elsewhere classified (R26.2)     Time: 1610-9604 PT Time Calculation (min) (ACUTE ONLY): 21 min  Charges:  $Therapeutic Activity: 8-22 mins                     Bonney Leitz , PTA Acute Rehabilitation Services Office [phone]    Florestine Avers 07/26/2022, 4:29 PM

## 2022-07-26 NOTE — NC FL2 (Cosign Needed Addendum)
[place] MEDICAID FL2 LEVEL OF CARE FORM     IDENTIFICATION  Patient Name: Gerald Valdez Birthdate: 08-15-1928 Sex: male Admission Date (Current Location): 07/19/2022  [facility] and [place] Number:  Producer, television/film/video and Address:  [facility] [employer] [place]. [facility], [address] [place], [place] [address]      Provider Number: [id number]  Attending Physician Name and Address:  Lanae Boast, MD  Relative Name and Phone Number:  Corrie Dandy (Spouse) [phone] Aurea Graff (daughter) [phone]    Current Level of Care: Hospital Recommended Level of Care: Skilled Nursing Facility Prior Approval Number:    Date Approved/Denied:   PASRR Number: [id number]  Discharge Plan: SNF    Current Diagnoses: Patient Active Problem List   Diagnosis Date Noted   Atrial flutter with rapid ventricular response (HCC) 07/21/2022   Acute heart failure (HCC) 07/21/2022   Acute on chronic hypoxic respiratory failure (HCC) 07/20/2022   Acute on chronic respiratory failure with hypoxia and hypercapnia (HCC) 07/19/2022   Malignant neoplasm of bladder wall (HCC) 05/27/2022   Hypokalemia 07/04/2021   Thrombocytopenia (HCC) 07/04/2021   Iron deficiency anemia 07/04/2021   Protein-calorie malnutrition, severe 07/03/2021   Dehydration 07/01/2021   AKI (acute kidney injury) (HCC) 07/01/2021   Stage 3a chronic kidney disease (CKD) (HCC) 07/01/2021   Protein-calorie malnutrition, moderate (HCC) 07/01/2021   Diarrhea 07/01/2021   UTI (urinary tract infection) 07/01/2021   Sepsis (HCC) 07/01/2021   Chronic respiratory failure with hypoxia (HCC) 07/01/2021   Hyperglycemia 07/01/2021   Normocytic anemia 07/01/2021   Urinary retention 06/26/2021   Cancer of overlapping sites of bladder (HCC) 06/25/2021   Malignant neoplasm of overlapping sites of bladder (HCC) 11/20/2020   COPD (chronic obstructive pulmonary disease) (HCC) 02/10/2020   Aortic atherosclerosis (HCC) 02/10/2020   Dyspnea on exertion  02/10/2020   Essential hypertension 02/10/2020   Hyperlipidemia 02/10/2020   GERD (gastroesophageal reflux disease) 02/10/2020   GI bleed 01/30/2014   Acute post-hemorrhagic anemia 01/30/2014   Acute duodenal ulcer with bleeding 01/30/2014    Orientation RESPIRATION BLADDER Height & Weight     Self, Place  O2 (Nasal Cannula 3 liters) Incontinent, External catheter (External Urinary Catheter) Weight: 134 lb 4.2 oz (60.9 kg) Height:  5\' 6"  (167.6 cm)  BEHAVIORAL SYMPTOMS/MOOD NEUROLOGICAL BOWEL NUTRITION STATUS      Incontinent Diet (Please see discharge summary)  AMBULATORY STATUS COMMUNICATION OF NEEDS Skin   Extensive Assist Verbally Other (Comment) (Appropriate for ethnicity,Dry,Ecchymosis,Erythema,Buttocks,Bil.,Wound/Incision LDAs)                       Personal Care Assistance Level of Assistance  Bathing, Feeding, Dressing Bathing Assistance: Maximum assistance Feeding assistance: Limited assistance Dressing Assistance: Maximum assistance     Functional Limitations Info  Sight, Hearing, Speech   Hearing Info: Impaired Speech Info: Adequate    SPECIAL CARE FACTORS FREQUENCY  PT (By licensed PT), OT (By licensed OT)     PT Frequency: 5x min weekly OT Frequency: 5x min weekly            Contractures Contractures Info: Not present    Additional Factors Info  Code Status, Allergies, Psychotropic Code Status Info: DNR Allergies Info: NKA Psychotropic Info: escitalopram (LEXAPRO) tablet 10 mg daily         Current Medications (07/26/2022):  This is the current hospital active medication list Current Facility-Administered Medications  Medication Dose Route Frequency Provider Last Rate Last Admin   apixaban (ELIQUIS) tablet 2.5 mg  2.5  mg Oral BID Abagail Kitchens, PA-C   2.5 mg at 07/26/22 0930   atorvastatin (LIPITOR) tablet 40 mg  40 mg Oral Daily Yvonna Alanis L, PA-C   40 mg at 07/26/22 0930   escitalopram (LEXAPRO) tablet 10 mg  10 mg Oral Daily Debe Coder B, MD   10 mg at 07/26/22 0930   feeding supplement (ENSURE ENLIVE / ENSURE PLUS) liquid 237 mL  237 mL Oral BID BM Kc, Ramesh, MD   237 mL at 07/26/22 0930   ipratropium-albuterol (DUONEB) 0.5-2.5 (3) MG/3ML nebulizer solution 3 mL  3 mL Nebulization Q4H PRN Debe Coder B, MD       metoprolol tartrate (LOPRESSOR) tablet 25 mg  25 mg Oral BID Lewayne Bunting, MD       multivitamin with minerals tablet 1 tablet  1 tablet Oral Daily Kc, Ramesh, MD   1 tablet at 07/26/22 0930   polyethylene glycol (MIRALAX / GLYCOLAX) packet 17 g  17 g Oral Daily PRN Dow Adolph N, DO       sodium chloride flush (NS) 0.9 % injection 3 mL  3 mL Intravenous Q12H Debe Coder B, MD   3 mL at 07/26/22 0931   tamsulosin (FLOMAX) capsule 0.4 mg  0.4 mg Oral QPC supper Kc, Dayna Barker, MD   0.4 mg at 07/25/22 1735   umeclidinium bromide (INCRUSE ELLIPTA) 62.5 MCG/ACT 1 puff  1 puff Inhalation Daily Inez Catalina, MD   1 puff at 07/26/22 1610   Facility-Administered Medications Ordered in Other Encounters  Medication Dose Route Frequency Provider Last Rate Last Admin   gemcitabine (GEMZAR) chemo syringe for bladder instillation 2,000 mg  2,000 mg Bladder Instillation Once Marcine Matar, MD         Discharge Medications: Please see discharge summary for a list of discharge medications.  Relevant Imaging Results:  Relevant Lab Results:   Additional Information [id number]  Delilah Shan, LCSWA

## 2022-07-26 NOTE — TOC Progression Note (Addendum)
Transition of Care [facility]) - Progression Note    Patient Details  Name: Gerald Valdez MRN: [id number] Date of Birth: Sep 09, 1928  Transition of Care [facility]) CM/SW Contact  Delilah Shan, LCSWA Phone Number: 07/26/2022, 10:14 AM  Clinical Narrative:     CSW received consult family has decided they want patient to go to SNF. CSW called patients daughter Glendon Axe who confirmed. Lunanne gave CSW permission to fax out referral for SNF placement. Number once choice is [facility]. Second choice is [place]. CSW discussed insurance authorization process.  No further questions reported at this time. CSW to continue to follow and assist with discharge planning needs. Patients passr pending. CSW submitted clinicals to  must for review.  CSW will continue to follow and assist with patients dc planning needs.  Patients passr approved and added to patients FL2 [id number]. CSW awaiting to hear if patient will need Bipap or not. If patient does not need Bipap, French Ana informed CSW [facility] can offer. CSW will continue to follow.  Expected Discharge Plan: Home w Home Health Services Barriers to Discharge: No Barriers Identified  Expected Discharge Plan and Services In-house Referral: Clinical Social Work Discharge Planning Services: CM Consult Post Acute Care Choice: NA Living arrangements for the past 2 months: Single Family Home                   DME Agency: NA       HH Arranged: RN, Disease Management, PT HH Agency: [facility] Date HH Agency Contacted: 07/22/22 Time HH Agency Contacted: 1215 Representative spoke with at [facility] Agency: Elnita Maxwell   Social Determinants of Health (SDOH) Interventions SDOH Screenings   Food Insecurity: No Food Insecurity (07/22/2022)  Housing: Low Risk  (07/22/2022)  Transportation Needs: No Transportation Needs (07/22/2022)  Utilities: Not At Risk (07/22/2022)  Tobacco Use: Medium Risk (07/20/2022)    Readmission Risk Interventions     No data to  display

## 2022-07-26 NOTE — Progress Notes (Signed)
PROGRESS NOTE VONNIE PROUT  ZOX:[id number] DOB: 02/07/1929 DOA: 07/19/2022 PCP: [name], MD  Brief Narrative/Hospital Course: 89 year old man with history significant of bladder cancer, chronic respiratory failure with hypoxia, COPD, HTN, GERD, HLD, h/o anemia, CKD 3a, malnutrition  with UTI ( since last Wednesday, on antibiotics) presented with somnolence and confusion, likely due to his wife's stopping his antibiotics due to concern of drug effect. On last Friday more sleepy and on Sunday could no wake him up. In the ED found to have severe hypercapnia with pCO2 101, chest x-ray possible  RLL pneumonia w/ small parapneumonic effusion, creatinine elevated 1.9 stable above baseline of 1.3 BNP 644 troponin 42, WBC 10.4 and he has a macrocytic anemia, H/H 10 and 34. TSH was 2.145 , COVID-negative EKG showed a flutter with PVC.  Patient was placed on BiPAP, further is mentation improving.  Patient admitted. Patient went into a flutter with RVR with minimal activity cardiology was consulted> starting on Eliquis with plan for TEE/DCCV if difficulty rate control.  Also started on IV Lasix for acute CHF> patient also needed BiPAP during bedtime seen by pulmonary. Now transition to oral Lasix.  Seen by palliative care changed to DNR/DNI.   Subjective: Seen examined this morning Wife at the bedside Patient is alert awake Past 24 hours afebrile, patient was noted to be in a flutter with heart rate 130s 4:1 block 07/25/22 a.m. so placed on  metoprolol q6h  but 2 doses being held overnight due to hypotension Has been on nasal cannula.  Assessment and Plan: Principal Problem:   Acute on chronic respiratory failure with hypoxia and hypercapnia (HCC) Active Problems:   COPD (chronic obstructive pulmonary disease) (HCC)   Essential hypertension   Hyperlipidemia   Cancer of overlapping sites of bladder (HCC)   AKI (acute kidney injury) (HCC)   Stage 3a chronic kidney disease (CKD) (HCC)    Protein-calorie malnutrition, moderate (HCC)   UTI (urinary tract infection)   Chronic respiratory failure with hypoxia (HCC)   Atrial flutter with rapid ventricular response (HCC)   Acute heart failure (HCC)   Acute on chronic respiratory failure with hypoxia and hypercapnia Suspected COPD-long standing remote smoking history RLL pneumonia with possible parapneumonic effusion: S/P BiPAP initially then was on [facility] 3l(home setting),being managed on IV antibiotics for community-acquired pneumonia, iv lasix . On 6/13 became lethargic with ABG still showing hypercapnia PCCM consulted> resumed BiPAP then switched to bedtime, which he is tolerating. Completed antibiotics, given Diamox, Lasix overall respiratory status has been stable, doing well on home oxygen setting. ?  If he needs to be on home BiPAP at bedtime-paged PCCM to discuss   Acute metabolic encephalopathy due to hypercapnia Mild cognitive impairment -suspect mild dementia at baseline Mentation much improved ,aao x3.Did not use BiPAP again Cont PT OT, supportive care delirium precaution.  Recent UTI, on antibiotics outpatient UA unremarkable  Essential hypertension: BP soft 6/14> on Lasix and metoprolol. Amlodipine on hold.  Hypotensive overnight as low as 60s scheduled metoprolol has been been on hold> changed to every 12 hours per cardiology monitor  Hyperlipidemia: cont Crestor  New onset A flutter with  4:1 AV block w/ RVR: EF 45-50% no RWMA small pericardial effusion.TSH stable.Started on metoprolol and Eliquis. heart rate 130s 4:1 block 07/25/22 a.m. so placed on  metoprolol q6h  but 2 doses being held overnight due to hypotension.  Metoprolol schedule changed, plans for outpatient follow-up  Acute CHF w.  Moderately reduced EF: LVEF 45-50% on  echo. Seen by cardiology, CaloMist-improved, off lasix.  Cont to monitor daily I/O,weight, electrolytes and net balance as below.Keep on  salt/fluid restricted diet and monitor in  tele. Net IO Since Admission: -4,249.33 mL [07/26/22 0755]  Filed Weights   07/24/22 0605 07/25/22 0333 07/26/22 0449  Weight: 60.3 kg 60.2 kg 60.9 kg    Urine retention overnight needing in and out cath: Monitor postvoid residual residue, may need foley catheter if recurrent. Added on Flomax  Demand ischemia in the setting of CHF and A-flutter: Plan for cardiology Mild to moderate MR follow-up outpatient  Normocytic anemia: HB stable. was macrocytic, now normocytic.Likely related to chronic issues.No evidence of blood loss hemoglobin stable, avoid anticoagulation. Has known bladder cancer.  Recent Labs  Lab 07/22/22 0136 07/23/22 0159 07/24/22 0141 07/25/22 0224 07/26/22 0213  HGB 10.8* 10.2* 10.0* 9.4* 9.9*  HCT 34.9* 34.2* 32.8* 30.8* 33.2*    Cancer of overlapping sites of bladder:he is due for outpatient follow up this week - may need to be rescheduled   Hypokalemia:Resolved  AKI on CKD 3a: Likely in the setting of patient's infection, dehydration. In ED s/p lasix, bnp was high. Creatinine downtrending nicely monitor  Recent Labs    12/10/21 1155 05/14/22 0930 07/19/22 1619 07/20/22 1354 07/21/22 0648 07/22/22 0136 07/22/22 1505 07/23/22 0159 07/24/22 0141 07/25/22 0224 07/26/22 0213  BUN 24* 31* 34*  --  31* 30* 26* 24* 29* 31* 25*  CREATININE 1.33* 1.24 1.97* 1.92* 1.80* 1.71* 1.78* 1.83* 1.92* 1.98* 1.62*  CO2 31 35* 38*  --  37* 37* 40* 38* 33* 33* 30    Inadequate oral intake, at risk of malnutrition  Nutrition Problem: Inadequate oral intake Etiology: inability to eat Signs/Symptoms: NPO status Interventions: Refer to RD note for recommendations, MVI, Ensure Enlive (each supplement provides 350kcal and 20 grams of protein)  Goals of care patient with advanced age and multiple medical comorbidities, palliative care consultation was requested now changed to DNR/DNI overall prognosis does not appear bright.  Continue on full scope of treatment  DVT  prophylaxis: apixaban (ELIQUIS) tablet 2.5 mg Start: 07/21/22 2200 Code Status:   Code Status: DNR Family Communication: plan of care discussed with patient/wife at bedside. Patient status is: Inpatient because of hypercapnic respiratory failure  Level of care: Progressive   Dispo: The patient is from: Home with wife            Anticipated disposition: SNF.  Wife prefers [facility] Objective: Vitals last 24 hrs: Vitals:   07/25/22 2351 07/26/22 0442 07/26/22 0449 07/26/22 0724  BP: (!) 96/57 (!) 60/39 99/60   Pulse: 88 88    Resp: 20 (!) 22    Temp: 97.7 F (36.5 C) 98.2 F (36.8 C)    TempSrc: Oral Oral    SpO2: 100% 100%  100%  Weight:   60.9 kg   Height:       Weight change: 0.7 kg  Physical Examination: General exam: AAO, weak,older appearing HEENT:Oral mucosa moist, Ear/Nose WNL grossly, dentition normal. Respiratory system: bilaterally CLEAR BS, no use of accessory muscle Cardiovascular system: S1 & S2 +, regular rate, JVD neg. Gastrointestinal system: Abdomen soft, NT,ND,BS+ Nervous System:Alert, awake, moving extremities and grossly nonfocal Extremities: LE ankle edema neg, lower extremities warm Skin: No rashes,no icterus. MSK: Normal muscle bulk,tone, power   Medications reviewed:  Scheduled Meds:  apixaban  2.5 mg Oral BID   atorvastatin  40 mg Oral Daily   escitalopram  10 mg Oral Daily   metoprolol  tartrate  12.5 mg Oral Q6H   multivitamin with minerals  1 tablet Oral Daily   sodium chloride flush  3 mL Intravenous Q12H   tamsulosin  0.4 mg Oral QPC supper   umeclidinium bromide  1 puff Inhalation Daily   Continuous Infusions:    Diet Order             Diet Heart Room service appropriate? Yes; Fluid consistency: Thin  Diet effective now                  Intake/Output Summary (Last 24 hours) at 07/26/2022 0755 Last data filed at 07/26/2022 0447 Gross per 24 hour  Intake 480 ml  Output 1125 ml  Net -645 ml    Net IO Since Admission: -4,249.33  mL [07/26/22 0755]  Wt Readings from Last 3 Encounters:  07/26/22 60.9 kg  05/27/22 60.2 kg  05/14/22 60.2 kg    Unresulted Labs (From admission, onward)    None     Data Reviewed: I have personally reviewed following labs and imaging studies CBC: Recent Labs  Lab 07/19/22 1619 07/19/22 1627 07/22/22 0136 07/23/22 0159 07/24/22 0141 07/25/22 0224 07/26/22 0213  WBC 10.4   < > 14.5* 9.6 8.7 11.6* 12.0*  NEUTROABS 7.9*  --   --   --   --   --   --   HGB 10.2*   < > 10.8* 10.2* 10.0* 9.4* 9.9*  HCT 34.6*   < > 34.9* 34.2* 32.8* 30.8* 33.2*  MCV 100.9*   < > 96.7 100.6* 100.3* 99.4 97.6  PLT 195   < > 214 192 179 179 174   < > = values in this interval not displayed.    Basic Metabolic Panel: Recent Labs  Lab 07/22/22 0136 07/22/22 1505 07/23/22 0159 07/24/22 0141 07/25/22 0224 07/26/22 0213  NA 137 139 138 139 133* 133*  K 3.3* 3.5 3.1* 3.9 3.7 3.9  CL 88* 87* 89* 95* 92* 90*  CO2 37* 40* 38* 33* 33* 30  GLUCOSE 115* 138* 96 106* 106* 111*  BUN 30* 26* 24* 29* 31* 25*  CREATININE 1.71* 1.78* 1.83* 1.92* 1.98* 1.62*  CALCIUM 8.6* 8.4* 8.5* 8.2* 8.0* 8.3*  MG 2.2  --   --  2.0  --   --    GFR: Estimated Creatinine Clearance: 25.6 mL/min (A) (by C-G formula based on SCr of 1.62 mg/dL (H)). Liver Function Tests: Recent Labs  Lab 07/19/22 1617  AST 37  ALT 72*  ALKPHOS 36*  BILITOT 0.4  PROT 6.7  ALBUMIN 4.5   No results for input(s): "LIPASE", "AMYLASE" in the last 168 hours. Recent Labs  Lab 07/19/22 1619  AMMONIA 28    No results for input(s): "TSH", "T4TOTAL", "FREET4", "T3FREE", "THYROIDAB" in the last 72 hours. Sepsis Labs: Recent Labs  Lab 07/19/22 1619  LATICACIDVEN 0.8     Recent Results (from the past 240 hour(s))  Blood culture (routine x 2)     Status: None   Collection Time: 07/19/22  4:15 PM   Specimen: BLOOD  Result Value Ref Range Status   Specimen Description   Final    BLOOD RIGHT ANTECUBITAL Performed at [facility], [address], [place], [place] [address]    Special Requests   Final    BOTTLES DRAWN AEROBIC AND ANAEROBIC Blood Culture adequate volume Performed at [facility], [address], [place], [place] [address]    Culture   Final  NO GROWTH 5 DAYS Performed at [facility], [address] [place], [place] [address]    Report Status 07/24/2022 FINAL  Final  Blood culture (routine x 2)     Status: None   Collection Time: 07/19/22  4:18 PM   Specimen: BLOOD  Result Value Ref Range Status   Specimen Description   Final    BLOOD LEFT ANTECUBITAL Performed at [facility], [address] [place], [place] [address]    Special Requests   Final    BOTTLES DRAWN AEROBIC AND ANAEROBIC Blood Culture adequate volume Performed at [facility], [address] [place], [place] [address]    Culture   Final    NO GROWTH 5 DAYS Performed at [facility], [address], [place], [place] [address]    Report Status 07/24/2022 FINAL  Final  SARS Coronavirus 2 by RT PCR (hospital order, performed in [facility] hospital lab) *cepheid single result test* Anterior Nasal Swab     Status: None   Collection Time: 07/19/22  4:19 PM   Specimen: Anterior Nasal Swab  Result Value Ref Range Status   SARS Coronavirus 2 by RT PCR NEGATIVE NEGATIVE Final    Comment: (NOTE) SARS-CoV-2 target nucleic acids are NOT DETECTED.  The SARS-CoV-2 RNA is generally detectable in upper and lower respiratory specimens during the acute phase of infection. The lowest concentration of SARS-CoV-2 viral copies this assay can detect is 250 copies / mL. A negative result does not preclude SARS-CoV-2 infection and should not be used as the sole basis for treatment or other patient management decisions.  A negative result may occur with improper specimen collection / handling, submission of specimen other than nasopharyngeal swab,  presence of viral mutation(s) within the areas targeted by this assay, and inadequate number of viral copies (<250 copies / mL). A negative result must be combined with clinical observations, patient history, and epidemiological information.  Fact Sheet for Patients:   RoadLapTop.co.za  Fact Sheet for Healthcare Providers: http://kim-miller.com/  This test is not yet approved or  cleared by the [place] FDA and has been authorized for detection and/or diagnosis of SARS-CoV-2 by FDA under an Emergency Use Authorization (EUA).  This EUA will remain in effect (meaning this test can be used) for the duration of the COVID-19 declaration under Section 564(b)(1) of the Act, 21 U.S.C. section 360bbb-3(b)(1), unless the authorization is terminated or revoked sooner.  Performed at [facility], [address], [place], [place] [address]     Antimicrobials: Anti-infectives (From admission, onward)    Start     Dose/Rate Route Frequency Ordered Stop   07/20/22 1900  azithromycin (ZITHROMAX) 500 mg in sodium chloride 0.9 % 250 mL IVPB        500 mg 250 mL/hr over 60 Minutes Intravenous Every 24 hours 07/20/22 1208 07/23/22 1910   07/20/22 1300  cefTRIAXone (ROCEPHIN) 2 g in sodium chloride 0.9 % 100 mL IVPB        2 g 200 mL/hr over 30 Minutes Intravenous Every 24 hours 07/20/22 1208 07/24/22 1027   07/19/22 1845  cefTRIAXone (ROCEPHIN) 1 g in sodium chloride 0.9 % 100 mL IVPB        1 g 200 mL/hr over 30 Minutes Intravenous  Once 07/19/22 1837 07/19/22 2036   07/19/22 1845  azithromycin (ZITHROMAX) 500 mg in sodium chloride 0.9 % 250 mL IVPB        500 mg 250 mL/hr over  60 Minutes Intravenous  Once 07/19/22 1837 07/19/22 2036      Culture/Microbiology    Component Value Date/Time   SDES  07/19/2022 1618    BLOOD LEFT ANTECUBITAL Performed at [facility], [address], [place], [place]  [address]    [facility]  07/19/2022 1618    BOTTLES DRAWN AEROBIC AND ANAEROBIC Blood Culture adequate volume Performed at [facility], [address], [place], [place] [address]    CULT  07/19/2022 1618    NO GROWTH 5 DAYS Performed at [facility], [address] [place], [place] [address]    REPTSTATUS 07/24/2022 FINAL 07/19/2022 1618  Radiology Studies: No results found.   LOS: 6 days   Lanae Boast, MD [facility]  07/26/2022, 7:55 AM

## 2022-07-26 NOTE — Consult Note (Addendum)
NAME:  Gerald Valdez, MRN:  [id number], DOB:  Jul 02, 1928, LOS: 6 ADMISSION DATE:  07/19/2022, CONSULTATION DATE:  07/22/22  REFERRING MD:  MD Dayna Barker CHIEF COMPLAINT:  somnolence and confusion   History of Present Illness:  Patient is a 89 yr old male with significant past medical history of HTN, CAD, CKD 3a, Anxiety, GERD, Bladder cancer, anemia, presumed COPD, malnutrition, and receiving treatment for UTI (since 6/5) who presented to [facility] ED with somnolence and confusion on 6/10. Somulence with confusion began on 6/7 and progressively worsened on 6/9 where patient was not waking up. Upon admission to the ED, patient was hypercarbic, chest x-ray showing possible pneumonia RLL with small parapneumonic effusion, BNP 644, troponin 42, EKG with atrial flutter. Patient still having hypercarbia with lethargia despite utilization of BIPAP. PCCM consulted to assist in managing hypercarbia.   Pertinent  Medical History   Past Medical History:  Diagnosis Date   Acute duodenal ulcer with bleeding 01/30/2014   Acute post-hemorrhagic anemia 01/30/2014   Anxiety    Aortic atherosclerosis (HCC) 02/10/2020   Arthritis    Basal cell carcinoma    Cancer (HCC)    bladder   Chronic kidney disease    COPD exacerbation (HCC) 02/10/2020   Coronary artery disease    Dyspnea    Dyspnea on exertion 02/10/2020   Emphysema of lung (HCC)    Essential hypertension 02/10/2020   GERD (gastroesophageal reflux disease) 02/10/2020   GI bleed 01/30/2014   Hyperlipidemia 02/10/2020     Significant Hospital Events: Including procedures, antibiotic start and stop dates in addition to other pertinent events   6/10 Admit with acute on chronic respiratory failure, respiratory acidosis, RLL pna  6/13 PCCM consulted for refractory hypercarbia   Interim History / Subjective:  No concerns  Objective   Blood pressure (!) 97/58, pulse 91, temperature 97.7 F (36.5 C), temperature source Oral, resp. rate 19, height 5\' 6"   (1.676 m), weight 60.9 kg, SpO2 100 %.        Intake/Output Summary (Last 24 hours) at 07/26/2022 1430 Last data filed at 07/26/2022 0447 Gross per 24 hour  Intake --  Output 625 ml  Net -625 ml   Filed Weights   07/24/22 0605 07/25/22 0333 07/26/22 0449  Weight: 60.3 kg 60.2 kg 60.9 kg   Physical Exam: General: Elderly, chronically ill-appearing, no acute distress HENT: [place], AT, OP clear, MMM Eyes: EOMI, no scleral icterus Respiratory: Diminished but clear to auscultation bilaterally.  No crackles, wheezing or rales Cardiovascular: RRR, -M/R/G, no JVD GI: BS+, soft, nontender Extremities:-Edema,-tenderness Neuro: AAO x3, CNII-XII grossly intact  Resolved Hospital Problem list   N/a   Assessment & Plan:  Acute on chronic respiratory failure with hypoxia and hypercarbia  complicated by CHF (HFrEF) and RLL Pna - on 3L at home Presumed COPD hx, emphysema at the bases quit smoking hx in 1994, 40 yr pack smoking hx  6/12 ECHO 40-50%, some decrease in LV function from prior studies No regional wall abnormalities, RV mildly elevated  ABG 6/13 7.46, pO2 85, Bicarb 49.1  Highly suspect refractory hypercarbia is due to loop diuretic administration  P: Repeat ABG today. If normal, no BiPAP needed at discharge Continue BiPAP as needed for now Wean supplemental O2 for goal >88% Will order IS Mobilize as tolerated, aggressive pulmonary hygiene  START LAMA/LABA Will arrange follow-up with me in 1 month as outpatient as hospital follow-up  Addendum: ABG with chronic hypercarbic respiratory failure  Patient continues to exhibit signs  of hypercapnia associated with chronic respiratory failure secondary to emphysema. Patient requires the use of NIV both QHS and daytime to help with exacerbation periods. The use of the BiPAP will treat patient's high PCO2 levels and can reduce risk of exacerbations and future hospitalizations when used at night and during the day.   RLL PNA P: Completed  antibiotics  I have arranged follow-up with me in Pulmonary clinic to establish care.  Best Practice (right click and "Reselect all SmartList Selections" daily)   Diet/type: per primary DVT prophylaxis: per primary  GI prophylaxis: per primary  Lines: N/A Foley:  N/A Code Status:  full code Last date of multidisciplinary goals of care discussion [ per primary]  Review of Systems:   Please see the history of present illness. All other systems reviewed and are negative    Past Medical History:  He,  has a past medical history of Acute duodenal ulcer with bleeding (01/30/2014), Acute post-hemorrhagic anemia (01/30/2014), Anxiety, Aortic atherosclerosis (HCC) (02/10/2020), Arthritis, Basal cell carcinoma, Cancer (HCC), Chronic kidney disease, COPD exacerbation (HCC) (02/10/2020), Coronary artery disease, Dyspnea, Dyspnea on exertion (02/10/2020), Emphysema of lung (HCC), Essential hypertension (02/10/2020), GERD (gastroesophageal reflux disease) (02/10/2020), GI bleed (01/30/2014), and Hyperlipidemia (02/10/2020).   Surgical History:   Past Surgical History:  Procedure Laterality Date   BLADDER SURGERY     CATARACT EXTRACTION W/ INTRAOCULAR LENS IMPLANT     CYSTOSCOPY W/ RETROGRADES Bilateral 11/20/2020   Procedure: CYSTOSCOPY WITH RETROGRADE PYELOGRAM;  Surgeon: Marcine Matar, MD;  Location: [facility];  Service: Urology;  Laterality: Bilateral;   CYSTOSCOPY W/ RETROGRADES Right 02/12/2021   Procedure: CYSTOSCOPY WITH RETROGRADE PYELOGRAM;  Surgeon: Marcine Matar, MD;  Location: [facility];  Service: Urology;  Laterality: Right;   CYSTOSCOPY W/ RETROGRADES Bilateral 06/25/2021   Procedure: CYSTOSCOPY WITH RETROGRADE PYELOGRAM;  Surgeon: Marcine Matar, MD;  Location: [facility];  Service: Urology;  Laterality: Bilateral;   ESOPHAGOGASTRODUODENOSCOPY N/A 01/30/2014   Procedure: ESOPHAGOGASTRODUODENOSCOPY (EGD);  Surgeon: Louis Meckel, MD;  Location: [facility];  Service:  Endoscopy;  Laterality: N/A;   TONSILLECTOMY     TRANSURETHRAL RESECTION OF BLADDER TUMOR N/A 02/12/2021   Procedure: REPEAT TRANSURETHRAL RESECTION OF BLADDER TUMOR (TURBT);  Surgeon: Marcine Matar, MD;  Location: [facility];  Service: Urology;  Laterality: N/A;   TRANSURETHRAL RESECTION OF BLADDER TUMOR WITH MITOMYCIN-C N/A 11/20/2020   Procedure: TRANSURETHRAL RESECTION OF BLADDER TUMOR;  Surgeon: Marcine Matar, MD;  Location: [facility];  Service: Urology;  Laterality: N/A;   TRANSURETHRAL RESECTION OF BLADDER TUMOR WITH MITOMYCIN-C N/A 06/25/2021   Procedure: TRANSURETHRAL RESECTION OF BLADDER TUMOR WITH GEMCITABINE;  Surgeon: Marcine Matar, MD;  Location: [facility];  Service: Urology;  Laterality: N/A;  1 HR   TRANSURETHRAL RESECTION OF BLADDER TUMOR WITH MITOMYCIN-C N/A 12/10/2021   Procedure: TRANSURETHRAL RESECTION OF BLADDER TUMOR WITH GEMCITABINE;  Surgeon: Marcine Matar, MD;  Location: [facility];  Service: Urology;  Laterality: N/A;  1 HR   TRANSURETHRAL RESECTION OF BLADDER TUMOR WITH MITOMYCIN-C N/A 05/27/2022   Procedure: TRANSURETHRAL RESECTION OF BLADDER TUMOR;  Surgeon: Marcine Matar, MD;  Location: [facility];  Service: Urology;  Laterality: N/A;     Social History:   reports that he quit smoking about 30 years ago. His smoking use included cigarettes. He started smoking about 77 years ago. He has never used smokeless tobacco. He reports that he does not currently use alcohol. He reports that he does not use drugs.   Family History:  His family history includes Stroke  in his father.   Allergies No Known Allergies   Home Medications  Prior to Admission medications   Medication Sig Start Date End Date Taking? Authorizing Provider  amLODipine (NORVASC) 5 MG tablet Take 5 mg by mouth daily with breakfast.   Yes [provider]  ascorbic acid (VITAMIN C) 500 MG tablet Take 500 mg by mouth daily.   Yes [provider]  escitalopram (LEXAPRO) 10 MG  tablet Take 10 mg by mouth daily.   Yes [provider]  ferrous sulfate 325 (65 FE) MG tablet Take 325 mg by mouth daily with breakfast.   Yes [provider]  loratadine (CLARITIN) 10 MG tablet Take 10 mg by mouth daily.   Yes [provider]  Multiple Vitamin (MULTIVITAMIN WITH MINERALS) TABS tablet Take 1 tablet by mouth daily with breakfast.   Yes [provider]  OXYGEN Inhale 3 L into the lungs continuous.   Yes [provider]  rosuvastatin (CRESTOR) 20 MG tablet Take 20 mg by mouth at bedtime. 08/15/20  Yes [provider]  saccharomyces boulardii (FLORASTOR) 250 MG capsule Take 1 capsule (250 mg total) by mouth 2 (two) times daily. Patient taking differently: Take 250 mg by mouth daily. 07/05/21  Yes Leatha Gilding, MD        Care Time: 60 min  Mechele Collin, M.D. [facility] Pulmonary/Critical Care Medicine 07/26/2022 2:30 PM   See Amion for personal pager For hours between 7 PM to 7 AM, please call Elink for urgent questions

## 2022-07-27 DIAGNOSIS — G9341 Metabolic encephalopathy: Secondary | ICD-10-CM | POA: Diagnosis present

## 2022-07-27 DIAGNOSIS — I440 Atrioventricular block, first degree: Secondary | ICD-10-CM | POA: Diagnosis present

## 2022-07-27 DIAGNOSIS — F03A0 Unspecified dementia, mild, without behavioral disturbance, psychotic disturbance, mood disturbance, and anxiety: Secondary | ICD-10-CM | POA: Diagnosis present

## 2022-07-27 DIAGNOSIS — K219 Gastro-esophageal reflux disease without esophagitis: Secondary | ICD-10-CM | POA: Diagnosis not present

## 2022-07-27 DIAGNOSIS — G9340 Encephalopathy, unspecified: Secondary | ICD-10-CM | POA: Diagnosis not present

## 2022-07-27 DIAGNOSIS — R404 Transient alteration of awareness: Secondary | ICD-10-CM | POA: Diagnosis not present

## 2022-07-27 DIAGNOSIS — K5730 Diverticulosis of large intestine without perforation or abscess without bleeding: Secondary | ICD-10-CM | POA: Diagnosis not present

## 2022-07-27 DIAGNOSIS — Z8679 Personal history of other diseases of the circulatory system: Secondary | ICD-10-CM | POA: Diagnosis not present

## 2022-07-27 DIAGNOSIS — I130 Hypertensive heart and chronic kidney disease with heart failure and stage 1 through stage 4 chronic kidney disease, or unspecified chronic kidney disease: Secondary | ICD-10-CM | POA: Diagnosis present

## 2022-07-27 DIAGNOSIS — I6782 Cerebral ischemia: Secondary | ICD-10-CM | POA: Diagnosis not present

## 2022-07-27 DIAGNOSIS — Z7401 Bed confinement status: Secondary | ICD-10-CM | POA: Diagnosis not present

## 2022-07-27 DIAGNOSIS — R9089 Other abnormal findings on diagnostic imaging of central nervous system: Secondary | ICD-10-CM | POA: Diagnosis not present

## 2022-07-27 DIAGNOSIS — J9601 Acute respiratory failure with hypoxia: Secondary | ICD-10-CM | POA: Diagnosis not present

## 2022-07-27 DIAGNOSIS — M6259 Muscle wasting and atrophy, not elsewhere classified, multiple sites: Secondary | ICD-10-CM | POA: Diagnosis not present

## 2022-07-27 DIAGNOSIS — M6281 Muscle weakness (generalized): Secondary | ICD-10-CM | POA: Diagnosis not present

## 2022-07-27 DIAGNOSIS — G253 Myoclonus: Secondary | ICD-10-CM | POA: Diagnosis present

## 2022-07-27 DIAGNOSIS — E440 Moderate protein-calorie malnutrition: Secondary | ICD-10-CM | POA: Diagnosis present

## 2022-07-27 DIAGNOSIS — I700 Atherosclerosis of aorta: Secondary | ICD-10-CM | POA: Diagnosis not present

## 2022-07-27 DIAGNOSIS — D62 Acute posthemorrhagic anemia: Secondary | ICD-10-CM | POA: Diagnosis present

## 2022-07-27 DIAGNOSIS — I509 Heart failure, unspecified: Secondary | ICD-10-CM | POA: Diagnosis not present

## 2022-07-27 DIAGNOSIS — N3001 Acute cystitis with hematuria: Secondary | ICD-10-CM | POA: Diagnosis present

## 2022-07-27 DIAGNOSIS — R1311 Dysphagia, oral phase: Secondary | ICD-10-CM | POA: Diagnosis not present

## 2022-07-27 DIAGNOSIS — E871 Hypo-osmolality and hyponatremia: Secondary | ICD-10-CM | POA: Diagnosis present

## 2022-07-27 DIAGNOSIS — J432 Centrilobular emphysema: Secondary | ICD-10-CM | POA: Diagnosis present

## 2022-07-27 DIAGNOSIS — C679 Malignant neoplasm of bladder, unspecified: Secondary | ICD-10-CM | POA: Diagnosis present

## 2022-07-27 DIAGNOSIS — Z8551 Personal history of malignant neoplasm of bladder: Secondary | ICD-10-CM | POA: Diagnosis not present

## 2022-07-27 DIAGNOSIS — R2681 Unsteadiness on feet: Secondary | ICD-10-CM | POA: Diagnosis not present

## 2022-07-27 DIAGNOSIS — N1831 Chronic kidney disease, stage 3a: Secondary | ICD-10-CM | POA: Diagnosis present

## 2022-07-27 DIAGNOSIS — R41841 Cognitive communication deficit: Secondary | ICD-10-CM | POA: Diagnosis not present

## 2022-07-27 DIAGNOSIS — J9621 Acute and chronic respiratory failure with hypoxia: Secondary | ICD-10-CM | POA: Diagnosis not present

## 2022-07-27 DIAGNOSIS — E782 Mixed hyperlipidemia: Secondary | ICD-10-CM | POA: Diagnosis not present

## 2022-07-27 DIAGNOSIS — T83511A Infection and inflammatory reaction due to indwelling urethral catheter, initial encounter: Secondary | ICD-10-CM | POA: Diagnosis present

## 2022-07-27 DIAGNOSIS — I480 Paroxysmal atrial fibrillation: Secondary | ICD-10-CM | POA: Diagnosis present

## 2022-07-27 DIAGNOSIS — Y846 Urinary catheterization as the cause of abnormal reaction of the patient, or of later complication, without mention of misadventure at the time of the procedure: Secondary | ICD-10-CM | POA: Diagnosis present

## 2022-07-27 DIAGNOSIS — I4892 Unspecified atrial flutter: Secondary | ICD-10-CM | POA: Diagnosis present

## 2022-07-27 DIAGNOSIS — R531 Weakness: Secondary | ICD-10-CM | POA: Diagnosis not present

## 2022-07-27 DIAGNOSIS — Z741 Need for assistance with personal care: Secondary | ICD-10-CM | POA: Diagnosis not present

## 2022-07-27 DIAGNOSIS — I5042 Chronic combined systolic (congestive) and diastolic (congestive) heart failure: Secondary | ICD-10-CM | POA: Diagnosis present

## 2022-07-27 DIAGNOSIS — E785 Hyperlipidemia, unspecified: Secondary | ICD-10-CM | POA: Diagnosis present

## 2022-07-27 DIAGNOSIS — Z66 Do not resuscitate: Secondary | ICD-10-CM | POA: Diagnosis present

## 2022-07-27 DIAGNOSIS — J9611 Chronic respiratory failure with hypoxia: Secondary | ICD-10-CM | POA: Diagnosis not present

## 2022-07-27 DIAGNOSIS — J441 Chronic obstructive pulmonary disease with (acute) exacerbation: Secondary | ICD-10-CM | POA: Diagnosis present

## 2022-07-27 DIAGNOSIS — R319 Hematuria, unspecified: Secondary | ICD-10-CM | POA: Diagnosis not present

## 2022-07-27 DIAGNOSIS — J9612 Chronic respiratory failure with hypercapnia: Secondary | ICD-10-CM | POA: Diagnosis not present

## 2022-07-27 DIAGNOSIS — I959 Hypotension, unspecified: Secondary | ICD-10-CM | POA: Diagnosis not present

## 2022-07-27 DIAGNOSIS — R0902 Hypoxemia: Secondary | ICD-10-CM | POA: Diagnosis not present

## 2022-07-27 DIAGNOSIS — R569 Unspecified convulsions: Secondary | ICD-10-CM | POA: Diagnosis not present

## 2022-07-27 DIAGNOSIS — R410 Disorientation, unspecified: Secondary | ICD-10-CM | POA: Diagnosis not present

## 2022-07-27 DIAGNOSIS — I5022 Chronic systolic (congestive) heart failure: Secondary | ICD-10-CM | POA: Diagnosis not present

## 2022-07-27 DIAGNOSIS — D6832 Hemorrhagic disorder due to extrinsic circulating anticoagulants: Secondary | ICD-10-CM | POA: Diagnosis present

## 2022-07-27 DIAGNOSIS — I10 Essential (primary) hypertension: Secondary | ICD-10-CM | POA: Diagnosis not present

## 2022-07-27 DIAGNOSIS — I2510 Atherosclerotic heart disease of native coronary artery without angina pectoris: Secondary | ICD-10-CM | POA: Diagnosis present

## 2022-07-27 DIAGNOSIS — J449 Chronic obstructive pulmonary disease, unspecified: Secondary | ICD-10-CM | POA: Diagnosis not present

## 2022-07-27 DIAGNOSIS — J9622 Acute and chronic respiratory failure with hypercapnia: Secondary | ICD-10-CM | POA: Diagnosis not present

## 2022-07-27 MED ORDER — METOPROLOL TARTRATE 25 MG PO TABS: 25.0000 mg | ORAL_TABLET | Freq: Two times a day (BID) | ORAL | Status: DC

## 2022-07-27 MED ORDER — UMECLIDINIUM-VILANTEROL 62.5-25 MCG/ACT IN AEPB: 1.0000 | INHALATION_SPRAY | Freq: Every day | RESPIRATORY_TRACT | Status: DC

## 2022-07-27 MED ORDER — ATORVASTATIN CALCIUM 40 MG PO TABS: 40.0000 mg | ORAL_TABLET | Freq: Every day | ORAL | Status: DC

## 2022-07-27 MED ORDER — ENSURE ENLIVE PO LIQD: 237.0000 mL | Freq: Two times a day (BID) | ORAL | 12 refills | Status: DC

## 2022-07-27 MED ORDER — METOPROLOL TARTRATE 25 MG PO TABS: 12.5000 mg | ORAL_TABLET | Freq: Two times a day (BID) | ORAL | Status: DC

## 2022-07-27 MED ORDER — APIXABAN 2.5 MG PO TABS: 2.5000 mg | ORAL_TABLET | Freq: Two times a day (BID) | ORAL | Status: DC

## 2022-07-27 MED ORDER — POLYETHYLENE GLYCOL 3350 17 G PO PACK: 17.0000 g | PACK | Freq: Every day | ORAL | 0 refills | Status: DC | PRN

## 2022-07-27 MED ORDER — TAMSULOSIN HCL 0.4 MG PO CAPS: 0.4000 mg | ORAL_CAPSULE | Freq: Every day | ORAL | Status: DC

## 2022-07-27 NOTE — Progress Notes (Signed)
Follow up with cardiology arranged on 08/13/22, see AVS

## 2022-07-27 NOTE — TOC Progression Note (Signed)
Transition of Care [facility]) - Progression Note    Patient Details  Name: Gerald Valdez MRN: [id number] Date of Birth: 1929/01/05  Transition of Care [facility]) CM/SW Contact  Delilah Shan, LCSWA Phone Number: 07/27/2022, 11:29 AM  Clinical Narrative:     CSW spoke with Luanne patients daughter and provided SNF bed offers. Patients daughter accepted SNF bed offer with [facility]. CSW spoke with [facility] who confirmed SNF bed offer. Shiela confirmed facility has Bipap for patient. Maud Deed confirmed she received bipap settings for patient from Respiratory's note. CSW informed MD.  CSW will continue to follow and assist with patients dc planning needs.  Expected Discharge Plan: Home w Home Health Services Barriers to Discharge: No Barriers Identified  Expected Discharge Plan and Services In-house Referral: Clinical Social Work Discharge Planning Services: CM Consult Post Acute Care Choice: NA Living arrangements for the past 2 months: Single Family Home                   DME Agency: NA       HH Arranged: RN, Disease Management, PT HH Agency: [facility] Date HH Agency Contacted: 07/22/22 Time HH Agency Contacted: 1215 Representative spoke with at [facility] Agency: Elnita Maxwell   Social Determinants of Health (SDOH) Interventions SDOH Screenings   Food Insecurity: No Food Insecurity (07/22/2022)  Housing: Low Risk  (07/22/2022)  Transportation Needs: No Transportation Needs (07/22/2022)  Utilities: Not At Risk (07/22/2022)  Tobacco Use: Medium Risk (07/20/2022)    Readmission Risk Interventions     No data to display

## 2022-07-27 NOTE — TOC Transition Note (Signed)
Transition of Care [facility]) - CM/SW Discharge Note   Patient Details  Name: Gerald Valdez MRN: [id number] Date of Birth: 03-16-1928  Transition of Care [facility]) CM/SW Contact:  Delilah Shan, LCSWA Phone Number: 07/27/2022, 12:38 PM   Clinical Narrative:     Patient will DC to: [facility]  Anticipated DC date: 07/27/2022  Family notified: Glendon Axe   Transport by: Sharin Mons  ?  Per MD patient ready for DC to [facility] . RN, patient, patient's family, and facility notified of DC. Shiela with [facility] confirmed Bipap at facility. Discharge Summary sent to facility. RN given number for report [phone] RM# [phone]. DC packet on chart. DNR signed by MD attached to patients DC packet.Ambulance transport requested for patient.  CSW signing off.   Final next level of care: Skilled Nursing Facility Barriers to Discharge: No Barriers Identified   Patient Goals and CMS Choice CMS Medicare.gov Compare Post Acute Care list provided to:: Patient Represenative (must comment) (Patients daughter Glendon Axe) Choice offered to / list presented to : Adult Children (Patients daughter Glendon Axe)  Discharge Placement                Patient [facility] bed at: [facility] Patient to be transferred to facility by: [facility] Name of family member notified: Luanne Patient and family notified of of transfer: 07/27/22  Discharge Plan and Services Additional resources added to the After Visit Summary for   In-house Referral: Clinical Social Work Discharge Planning Services: CM Consult Post Acute Care Choice: NA            DME Agency: NA       HH Arranged: RN, Disease Management, PT HH Agency: [facility] Date [facility] Agency Contacted: 07/22/22 Time HH Agency Contacted: 1215 Representative spoke with at [facility] Agency: Elnita Maxwell  Social Determinants of Health (SDOH) Interventions SDOH Screenings   Food Insecurity: No Food Insecurity (07/22/2022)  Housing: Low  Risk  (07/22/2022)  Transportation Needs: No Transportation Needs (07/22/2022)  Utilities: Not At Risk (07/22/2022)  Tobacco Use: Medium Risk (07/20/2022)     Readmission Risk Interventions     No data to display

## 2022-07-27 NOTE — Discharge Summary (Addendum)
Physician Discharge Summary  ICHOLAS Valdez HKV:[id number] DOB: 04-Feb-1929 DOA: 07/19/2022  PCP: [name], MD  Admit date: 07/19/2022 Discharge date: 07/27/2022 Recommendations for Outpatient Follow-up:  Follow up with PCP in 1 weeks-call for appointment Please obtain BMP/CBC in one week Cont bipap bedtime 15/5, RR 16, 30% fio2  Discharge Dispo: SNF Discharge Condition: Stable Code Status:   Code Status: DNR Diet recommendation:  Diet Order             Diet Heart Room service appropriate? Yes; Fluid consistency: Thin  Diet effective now                    Brief/Interim Summary: 89 year old man with history significant of bladder cancer, chronic respiratory failure with hypoxia, COPD, HTN, GERD, HLD, h/o anemia, CKD 3a, malnutrition  with UTI ( since last Wednesday, on antibiotics) presented with somnolence and confusion, likely due to his wife's stopping his antibiotics due to concern of drug effect. On last Friday more sleepy and on Sunday could no wake him up. In the ED found to have severe hypercapnia with pCO2 101, chest x-ray possible  RLL pneumonia w/ small parapneumonic effusion, creatinine elevated 1.9 stable above baseline of 1.3 BNP 644 troponin 42, WBC 10.4 and he has a macrocytic anemia, H/H 10 and 34. TSH was 2.145 , COVID-negative EKG showed a flutter with PVC.  Patient was placed on BiPAP, further is mentation improving.  Patient admitted. Patient went into a flutter with RVR with minimal activity cardiology was consulted> starting on Eliquis with plan for TEE/DCCV if difficulty rate control.  Also started on IV Lasix for acute CHF> patient also needed BiPAP during bedtime seen by pulmonary. Now transition to oral Lasix.  Seen by palliative care changed to DNR/DNI. Patient will need home BiPAP given chronic hypercapnia and has been ordered, will need to follow-up with pulmonary to adjust At this time.  Stable for discharge awaiting for skilled nursing facility  placement   Discharge Diagnoses:  Principal Problem:   Acute on chronic respiratory failure with hypoxia and hypercapnia (HCC) Active Problems:   COPD (chronic obstructive pulmonary disease) (HCC)   Essential hypertension   Hyperlipidemia   Cancer of overlapping sites of bladder (HCC)   AKI (acute kidney injury) (HCC)   Stage 3a chronic kidney disease (CKD) (HCC)   Protein-calorie malnutrition, moderate (HCC)   UTI (urinary tract infection)   Chronic respiratory failure with hypoxia (HCC)   Atrial flutter with rapid ventricular response (HCC)   Acute heart failure (HCC)  Acute on chronic respiratory failure with hypoxia and hypercapnia Suspected COPD-long standing remote smoking history RLL pneumonia with possible parapneumonic effusion: S/P BiPAP initially then was on [place] 3l(home setting),being managed on IV antibiotics for community-acquired pneumonia, iv lasix . On 6/13 became lethargic with ABG still showing hypercapnia PCCM consulted> resumed BiPAP then switched to bedtime, which he is tolerating. Completed antibiotics, given Diamox, Lasix overall respiratory status has been stable, doing well on home oxygen setting. Discussed with pulmonary patient will need BiPAP at bedtime on discharge and will need outpatient follow-up he will need to be discharged on bipap 15/5, set rate 16   Acute metabolic encephalopathy due to hypercapnia Mild cognitive impairment -suspect mild dementia at baseline Mentation much improved ,aao x3.Did not use BiPAP again Cont PT OT, supportive care delirium precaution.   New onset A flutter with  4:1 AV block w/ RVR: EF 45-50% no RWMA small pericardial effusion.TSH stable.Started on metoprolol and Eliquis.  heart rate 130s 4:1 block 07/25/22 a.m. so placed on  metoprolol q6h> changed to twice daily watch for hypotension continue metoprolol and follow-up with cardiology as outpatient   Recent UTI, on antibiotics outpatient UA unremarkable   Essential  hypertension: BP soft at times on low-dose metoprolol for a flutter amlodipine has been on hold will discontinue on discharge.     Hyperlipidemia: cont Crestor   Acute CHF w.  Moderately reduced EF: LVEF 45-50% on echo. Seen by cardiology, fluid status improved, off lasix.  Cont to monitor daily I/O,weight, electrolytes and net balance as below.Keep on  salt/fluid restricted diet and monitor in tele. Net IO Since Admission: -5,300.33 mL [07/27/22 1008]       Filed Weights    07/25/22 0333 07/26/22 0449 07/27/22 0343  Weight: 60.2 kg 60.9 kg 60.7 kg    Urine retention needing in and out cath: Voiding well continue Flomax   Demand ischemia in the setting of CHF and A-flutter, no chest pain  Mild to moderate MR follow-up outpatient   Normocytic anemia: HB stable. was macrocytic, now normocytic.Likely related to chronic issues.No evidence of blood loss hemoglobin stable, avoid anticoagulation. Has known bladder cancer.  Last Labs         Recent Labs  Lab 07/22/22 0136 07/23/22 0159 07/24/22 0141 07/25/22 0224 07/26/22 0213  HGB 10.8* 10.2* 10.0* 9.4* 9.9*  HCT 34.9* 34.2* 32.8* 30.8* 33.2*       Cancer of overlapping sites of bladder:he is due for outpatient follow up this week - may need to be rescheduled   Hypokalemia:Resolved   AKI on CKD 3a: Likely in the setting of patient's infection, dehydration. In ED s/p lasix, bnp was high. Creatinine downtrending nicely monitor  Recent Labs (within last 365 days)               Recent Labs    12/10/21 1155 05/14/22 0930 07/19/22 1619 07/20/22 1354 07/21/22 0648 07/22/22 0136 07/22/22 1505 07/23/22 0159 07/24/22 0141 07/25/22 0224 07/26/22 0213  BUN 24* 31* 34*  --  31* 30* 26* 24* 29* 31* 25*  CREATININE 1.33* 1.24 1.97* 1.92* 1.80* 1.71* 1.78* 1.83* 1.92* 1.98* 1.62*  CO2 31 35* 38*  --  37* 37* 40* 38* 33* 33* 30       Inadequate oral intake, at risk of malnutrition  Nutrition Problem: Inadequate oral  intake Etiology: inability to eat Signs/Symptoms: NPO status Interventions: Refer to RD note for recommendations, MVI, Ensure Enlive (each supplement provides 350kcal and 20 grams of protein)   Goals of care patient with advanced age and multiple medical comorbidities, palliative care consultation was requested now changed to DNR/DNI overall prognosis does not appear bright.  Continue on full scope of treatment  Consults: Pmt, cardio pulmonary  Subjective: Alert awake pleasant.  Discharge Exam: Vitals:   07/27/22 0742 07/27/22 1034  BP:  117/68  Pulse:  91  Resp:    Temp:    SpO2: 98%    General: Pt is alert, awake, not in acute distress Cardiovascular: RRR, S1/S2 +, no rubs, no gallops Respiratory: CTA bilaterally, no wheezing, no rhonchi Abdominal: Soft, NT, ND, bowel sounds + Extremities: no edema, no cyanosis  Discharge Instructions  Discharge Instructions     Discharge instructions   Complete by: As directed    Cont bipap bedtime  Please call call MD or return to ER for similar or worsening recurring problem that brought you to hospital or if any fever,nausea/vomiting,abdominal pain, uncontrolled pain, chest pain,  shortness of breath or any other alarming symptoms.  Please follow-up your doctor as instructed in a week time and call the office for appointment.  Please avoid alcohol, smoking, or any other illicit substance and maintain healthy habits including taking your regular medications as prescribed.  You were cared for by a hospitalist during your hospital stay. If you have any questions about your discharge medications or the care you received while you were in the hospital after you are discharged, you can call the unit and ask to speak with the hospitalist on call if the hospitalist that took care of you is not available.  Once you are discharged, your primary care physician will handle any further medical issues. Please note that NO REFILLS for any discharge  medications will be authorized once you are discharged, as it is imperative that you return to your primary care physician (or establish a relationship with a primary care physician if you do not have one) for your aftercare needs so that they can reassess your need for medications and monitor your lab values   Increase activity slowly   Complete by: As directed       Allergies as of 07/27/2022   No Known Allergies      Medication List     STOP taking these medications    amLODipine 5 MG tablet Commonly known as: NORVASC   rosuvastatin 20 MG tablet Commonly known as: CRESTOR       TAKE these medications    apixaban 2.5 MG Tabs tablet Commonly known as: ELIQUIS Take 1 tablet (2.5 mg total) by mouth 2 (two) times daily.   ascorbic acid 500 MG tablet Commonly known as: VITAMIN C Take 500 mg by mouth daily.   atorvastatin 40 MG tablet Commonly known as: LIPITOR Take 1 tablet (40 mg total) by mouth daily. Start taking on: July 28, 2022   escitalopram 10 MG tablet Commonly known as: LEXAPRO Take 10 mg by mouth daily.   feeding supplement Liqd Take 237 mLs by mouth 2 (two) times daily between meals.   ferrous sulfate 325 (65 FE) MG tablet Take 325 mg by mouth daily with breakfast.   loratadine 10 MG tablet Commonly known as: CLARITIN Take 10 mg by mouth daily.   metoprolol tartrate 25 MG tablet Commonly known as: LOPRESSOR Take 0.5 tablets (12.5 mg total) by mouth 2 (two) times daily. Start taking on: July 28, 2022   multivitamin with minerals Tabs tablet Take 1 tablet by mouth daily with breakfast.   OXYGEN Inhale 3 L into the lungs continuous.   polyethylene glycol 17 g packet Commonly known as: MIRALAX / GLYCOLAX Take 17 g by mouth daily as needed for mild constipation.   saccharomyces boulardii 250 MG capsule Commonly known as: FLORASTOR Take 1 capsule (250 mg total) by mouth 2 (two) times daily. What changed: when to take this   tamsulosin 0.4 MG  Caps capsule Commonly known as: FLOMAX Take 1 capsule (0.4 mg total) by mouth daily after supper.   umeclidinium-vilanterol 62.5-25 MCG/ACT Aepb Commonly known as: ANORO ELLIPTA Inhale 1 puff into the lungs daily. Start taking on: July 28, 2022        Contact information for follow-up providers     Care, [facility] Follow up.   Why: Registered Nurse, Physical Therapy-Office to call with visit times. Contact information: [address] [place] [address] [phone]         Gaston Islam., NP Follow up  on 08/13/2022.   Specialty: Cardiology Why: at 1005am for your cardiology follow up appointment Contact information: [address] [place] [address] [phone]              Contact information for after-discharge care     Destination     [facility] [place] Preferred SNF .   Service: Skilled Nursing Contact information: [address] [place] [address] [phone]                    No Known Allergies  The results of significant diagnostics from this hospitalization (including imaging, microbiology, ancillary and laboratory) are listed below for reference.    Microbiology: Recent Results (from the past 240 hour(s))  Blood culture (routine x 2)     Status: None   Collection Time: 07/19/22  4:15 PM   Specimen: BLOOD  Result Value Ref Range Status   Specimen Description   Final    BLOOD RIGHT ANTECUBITAL Performed at [facility], [address], [place], [place] [address]    Special Requests   Final    BOTTLES DRAWN AEROBIC AND ANAEROBIC Blood Culture adequate volume Performed at [facility], [address], [place], [place] [address]    Culture   Final    NO GROWTH 5 DAYS Performed at [facility], [address] [place], [place] [address]    Report Status 07/24/2022 FINAL  Final  Blood culture (routine x 2)      Status: None   Collection Time: 07/19/22  4:18 PM   Specimen: BLOOD  Result Value Ref Range Status   Specimen Description   Final    BLOOD LEFT ANTECUBITAL Performed at [facility], [address] [place], [place] [address]    Special Requests   Final    BOTTLES DRAWN AEROBIC AND ANAEROBIC Blood Culture adequate volume Performed at [facility], [address], [place], [place] [address]    Culture   Final    NO GROWTH 5 DAYS Performed at [facility], [address] [place], [place] [address]    Report Status 07/24/2022 FINAL  Final  SARS Coronavirus 2 by RT PCR (hospital order, performed in [facility] hospital lab) *cepheid single result test* Anterior Nasal Swab     Status: None   Collection Time: 07/19/22  4:19 PM   Specimen: Anterior Nasal Swab  Result Value Ref Range Status   SARS Coronavirus 2 by RT PCR NEGATIVE NEGATIVE Final    Comment: (NOTE) SARS-CoV-2 target nucleic acids are NOT DETECTED.  The SARS-CoV-2 RNA is generally detectable in upper and lower respiratory specimens during the acute phase of infection. The lowest concentration of SARS-CoV-2 viral copies this assay can detect is 250 copies / mL. A negative result does not preclude SARS-CoV-2 infection and should not be used as the sole basis for treatment or other patient management decisions.  A negative result may occur with improper specimen collection / handling, submission of specimen other than nasopharyngeal swab, presence of viral mutation(s) within the areas targeted by this assay, and inadequate number of viral copies (<250 copies / mL). A negative result must be combined with clinical observations, patient history, and epidemiological information.  Fact Sheet for Patients:   RoadLapTop.co.za  Fact Sheet for Healthcare Providers: http://kim-miller.com/  This test is not yet approved or  cleared by the  [place] FDA and has been authorized for detection and/or diagnosis  of SARS-CoV-2 by FDA under an Emergency Use Authorization (EUA).  This EUA will remain in effect (meaning this test can be used) for the duration of the COVID-19 declaration under Section 564(b)(1) of the Act, 21 U.S.C. section 360bbb-3(b)(1), unless the authorization is terminated or revoked sooner.  Performed at [facility], [address] [place], [place] [address]     Procedures/Studies: ECHOCARDIOGRAM COMPLETE  Result Date: 07/21/2022    ECHOCARDIOGRAM REPORT   Patient Name:   GREGORY KOLLMANN Date of Exam: 07/21/2022 Medical Rec #:  [id number]       Height:       66.0 in Accession #:    [id number]      Weight:       145.9 lb Date of Birth:  08-25-1928       BSA:          1.749 m Patient Age:    89 years        BP:           108/53 mmHg Patient Gender: M               HR:           97 bpm. Exam Location:  Inpatient Procedure: 2D Echo, Cardiac Doppler and Color Doppler Indications:    CHF-Acute Diastolic I50.31  History:        Patient has prior history of Echocardiogram examinations, most                 recent 02/11/2020. CKD 3 and COPD; Risk Factors:Hypertension,                 Dyslipidemia and Former Smoker.  Sonographer:    Dondra Prader RVT RCS Referring Phys: [id number] EMILY B MULLEN  Sonographer Comments: Technically challenging study due to limited acoustic windows, Technically difficult study due to poor echo windows, suboptimal parasternal window, suboptimal apical window and suboptimal subcostal window. Image acquisition challenging due to respiratory motion, Image acquisition challenging due to patient body habitus and Image acquisition challenging due to uncooperative patient. IMPRESSIONS  1. Left ventricular ejection fraction, by estimation, is 45 to 50%. The left ventricle has mildly decreased function. The left ventricle has no regional wall motion abnormalities. There is mild left ventricular  hypertrophy. Left ventricular diastolic parameters are indeterminate.  2. Right ventricular systolic function is normal. The right ventricular size is mildly enlarged.  3. Right atrial size was mildly dilated.  4. A small pericardial effusion is present. The pericardial effusion is anterior to the right ventricle. There is no evidence of cardiac tamponade.  5. The mitral valve is degenerative. Mild to moderate mitral valve regurgitation.  6. The aortic valve was not well visualized. Aortic valve regurgitation is not visualized. Comparison(s): Prior images reviewed side by side. LVEF appears slightly decreased; this study is more technically difficult than prior. FINDINGS  Left Ventricle: LVOT calcification unchanged from prior. Left ventricular ejection fraction, by estimation, is 45 to 50%. The left ventricle has mildly decreased function. The left ventricle has no regional wall motion abnormalities. The left ventricular internal cavity size was normal in size. There is mild left ventricular hypertrophy. Left ventricular diastolic parameters are indeterminate. Right Ventricle: The right ventricular size is mildly enlarged. No increase in right ventricular wall thickness. Right ventricular systolic function is normal. Left Atrium: Left atrial size was normal in size. Right Atrium: Right atrial size was mildly dilated. Pericardium: A small pericardial effusion is present. The pericardial effusion is  anterior to the right ventricle. There is no evidence of cardiac tamponade. Mitral Valve: The mitral valve is degenerative in appearance. Mild to moderate mitral valve regurgitation. Tricuspid Valve: The tricuspid valve is normal in structure. Tricuspid valve regurgitation is mild . No evidence of tricuspid stenosis. Aortic Valve: The aortic valve was not well visualized. Aortic valve regurgitation is not visualized. Aortic valve mean gradient measures 4.0 mmHg. Aortic valve peak gradient measures 6.7 mmHg. Aortic valve  area, by VTI measures 1.77 cm. Pulmonic Valve: The pulmonic valve was not well visualized. Pulmonic valve regurgitation is not visualized. Aorta: The aortic root is normal in size and structure and the ascending aorta was not well visualized. IAS/Shunts: No atrial level shunt detected by color flow Doppler.  LEFT VENTRICLE PLAX 2D LVIDd:         4.20 cm   Diastology LVIDs:         3.30 cm   LV e' medial:    6.50 cm/s LV PW:         1.10 cm   LV E/e' medial:  10.7 LV IVS:        0.90 cm   LV e' lateral:   8.18 cm/s LVOT diam:     1.90 cm   LV E/e' lateral: 8.5 LV SV:         38 LV SV Index:   22 LVOT Area:     2.84 cm  RIGHT VENTRICLE             IVC RV Basal diam:  3.90 cm     IVC diam: 2.50 cm RV S prime:     11.70 cm/s TAPSE (M-mode): 1.7 cm LEFT ATRIUM           Index        RIGHT ATRIUM           Index LA diam:      3.90 cm 2.23 cm/m   RA Area:     18.20 cm LA Vol (A2C): 48.6 ml 27.79 ml/m  RA Volume:   56.90 ml  32.53 ml/m LA Vol (A4C): 37.5 ml 21.44 ml/m  AORTIC VALVE AV Area (Vmax):    1.62 cm AV Area (Vmean):   1.57 cm AV Area (VTI):     1.77 cm AV Vmax:           129.00 cm/s AV Vmean:          88.200 cm/s AV VTI:            0.216 m AV Peak Grad:      6.7 mmHg AV Mean Grad:      4.0 mmHg LVOT Vmax:         73.70 cm/s LVOT Vmean:        48.800 cm/s LVOT VTI:          0.135 m LVOT/AV VTI ratio: 0.62  AORTA Ao Root diam: 3.40 cm MITRAL VALVE               TRICUSPID VALVE MV Area (PHT): 4.67 cm    TR Peak grad:   30.2 mmHg MV Decel Time: 163 msec    TR Vmax:        275.00 cm/s MV E velocity: 69.85 cm/s MV A velocity: 61.70 cm/s  SHUNTS MV E/A ratio:  1.13        Systemic VTI:  0.14 m  Systemic Diam: 1.90 cm Riley Lam MD Electronically signed by Riley Lam MD Signature Date/Time: 07/21/2022/5:42:19 PM    Final    DG Chest Portable 1 View  Result Date: 07/19/2022 CLINICAL DATA:  Shortness of breath, altered mental status. EXAM: PORTABLE CHEST 1 VIEW  COMPARISON:  Chest radiograph 06/27/2021 FINDINGS: The cardiomediastinal silhouette is normal. Coarsened interstitial markings in both lungs are unchanged, chronic. There is hazy opacity in the right lower lobe and a probable small right pleural effusion. There is no other focal airspace opacity. There is no significant left effusion. There is no pneumothorax There is no acute osseous abnormality. IMPRESSION: Hazy right lower lobe opacity may reflect right lower lobe pneumonia and small parapneumonic effusion in the correct clinical setting. Electronically Signed   By: Lesia Hausen M.D.   On: 07/19/2022 17:26    Labs: BNP (last 3 results) Recent Labs    07/19/22 1619  BNP 644.6*   Basic Metabolic Panel: Recent Labs  Lab 07/22/22 0136 07/22/22 1505 07/23/22 0159 07/24/22 0141 07/25/22 0224 07/26/22 0213  NA 137 139 138 139 133* 133*  K 3.3* 3.5 3.1* 3.9 3.7 3.9  CL 88* 87* 89* 95* 92* 90*  CO2 37* 40* 38* 33* 33* 30  GLUCOSE 115* 138* 96 106* 106* 111*  BUN 30* 26* 24* 29* 31* 25*  CREATININE 1.71* 1.78* 1.83* 1.92* 1.98* 1.62*  CALCIUM 8.6* 8.4* 8.5* 8.2* 8.0* 8.3*  MG 2.2  --   --  2.0  --   --    Recent Labs  Lab 07/22/22 0136 07/23/22 0159 07/24/22 0141 07/25/22 0224 07/26/22 0213  WBC 14.5* 9.6 8.7 11.6* 12.0*  HGB 10.8* 10.2* 10.0* 9.4* 9.9*  HCT 34.9* 34.2* 32.8* 30.8* 33.2*  MCV 96.7 100.6* 100.3* 99.4 97.6  PLT 214 192 179 179 174      Component Value Date/Time   COLORURINE COLORLESS (A) 07/19/2022 2328   APPEARANCEUR CLEAR 07/19/2022 2328   LABSPEC 1.007 07/19/2022 2328   PHURINE 6.5 07/19/2022 2328   GLUCOSEU NEGATIVE 07/19/2022 2328   HGBUR SMALL (A) 07/19/2022 2328   BILIRUBINUR NEGATIVE 07/19/2022 2328   KETONESUR NEGATIVE 07/19/2022 2328   PROTEINUR 30 (A) 07/19/2022 2328   NITRITE NEGATIVE 07/19/2022 2328   LEUKOCYTESUR NEGATIVE 07/19/2022 2328   Sepsis Labs Recent Labs  Lab 07/23/22 0159 07/24/22 0141 07/25/22 0224 07/26/22 0213  WBC 9.6  8.7 11.6* 12.0*   Microbiology Recent Results (from the past 240 hour(s))  Blood culture (routine x 2)     Status: None   Collection Time: 07/19/22  4:15 PM   Specimen: BLOOD  Result Value Ref Range Status   Specimen Description   Final    BLOOD RIGHT ANTECUBITAL Performed at [facility], [address], [place], [place] [address]    Special Requests   Final    BOTTLES DRAWN AEROBIC AND ANAEROBIC Blood Culture adequate volume Performed at [facility], [address], [place], [place] [address]    Culture   Final    NO GROWTH 5 DAYS Performed at [facility], [address], [place], [place] [address]    Report Status 07/24/2022 FINAL  Final  Blood culture (routine x 2)     Status: None   Collection Time: 07/19/22  4:18 PM   Specimen: BLOOD  Result Value Ref Range Status   Specimen Description   Final    BLOOD LEFT ANTECUBITAL Performed at [facility], [address], [place], [place] [address]  Special Requests   Final    BOTTLES DRAWN AEROBIC AND ANAEROBIC Blood Culture adequate volume Performed at [facility], [address] [place], [place] [address]    Culture   Final    NO GROWTH 5 DAYS Performed at [facility], [address], [place], [place] [address]    Report Status 07/24/2022 FINAL  Final  SARS Coronavirus 2 by RT PCR (hospital order, performed in [facility] hospital lab) *cepheid single result test* Anterior Nasal Swab     Status: None   Collection Time: 07/19/22  4:19 PM   Specimen: Anterior Nasal Swab  Result Value Ref Range Status   SARS Coronavirus 2 by RT PCR NEGATIVE NEGATIVE Final    Comment: (NOTE) SARS-CoV-2 target nucleic acids are NOT DETECTED.  The SARS-CoV-2 RNA is generally detectable in upper and lower respiratory specimens during the acute phase of infection. The lowest concentration of SARS-CoV-2 viral copies this assay can detect is  250 copies / mL. A negative result does not preclude SARS-CoV-2 infection and should not be used as the sole basis for treatment or other patient management decisions.  A negative result may occur with improper specimen collection / handling, submission of specimen other than nasopharyngeal swab, presence of viral mutation(s) within the areas targeted by this assay, and inadequate number of viral copies (<250 copies / mL). A negative result must be combined with clinical observations, patient history, and epidemiological information.  Fact Sheet for Patients:   RoadLapTop.co.za  Fact Sheet for Healthcare Providers: http://kim-miller.com/  This test is not yet approved or  cleared by the [place] FDA and has been authorized for detection and/or diagnosis of SARS-CoV-2 by FDA under an Emergency Use Authorization (EUA).  This EUA will remain in effect (meaning this test can be used) for the duration of the COVID-19 declaration under Section 564(b)(1) of the Act, 21 U.S.C. section 360bbb-3(b)(1), unless the authorization is terminated or revoked sooner.  Performed at [facility], [address], [place], [place] [address]    Time coordinating discharge: 25 minutes  SIGNED: Darlin Drop, MD  [facility] 07/27/2022, 8:29 PM  If 7PM-7AM, please contact night-coverage [email or web address]

## 2022-07-27 NOTE — Care Management Important Message (Signed)
Important Message  Patient Details  Name: Gerald Valdez MRN: [id number] Date of Birth: January 11, 1929   Medicare Important Message Given:  Yes     Sherilyn Banker 07/27/2022, 2:18 PM

## 2022-07-27 NOTE — Progress Notes (Signed)
Heart Failure Navigator Progress Note  Assessed for Heart & Vascular TOC clinic readiness.  Patient with a EF of 45-50%, has a scheduled [facility] appointment on 08/13/2022. .   Navigator will sign off at this time.   Rhae Hammock, BSN, Scientist, clinical (histocompatibility and immunogenetics) Only

## 2022-07-27 NOTE — Progress Notes (Signed)
PROGRESS NOTE Gerald Valdez  ZOX:[id number] DOB: [birth year] DOA: 07/19/2022 PCP: [name], MD  Brief Narrative/Hospital Course: 89 year old man with history significant of bladder cancer, chronic respiratory failure with hypoxia, COPD, HTN, GERD, HLD, h/o anemia, CKD 3a, malnutrition  with UTI ( since last Wednesday, on antibiotics) presented with somnolence and confusion, likely due to his wife's stopping his antibiotics due to concern of drug effect. On last Friday more sleepy and on Sunday could no wake him up. In the ED found to have severe hypercapnia with pCO2 101, chest x-ray possible  RLL pneumonia w/ small parapneumonic effusion, creatinine elevated 1.9 stable above baseline of 1.3 BNP 644 troponin 42, WBC 10.4 and he has a macrocytic anemia, H/H 10 and 34. TSH was 2.145 , COVID-negative EKG showed a flutter with PVC.  Patient was placed on BiPAP, further is mentation improving.  Patient admitted. Patient went into a flutter with RVR with minimal activity cardiology was consulted> starting on Eliquis with plan for TEE/DCCV if difficulty rate control.  Also started on IV Lasix for acute CHF> patient also needed BiPAP during bedtime seen by pulmonary. Now transition to oral Lasix.  Seen by palliative care changed to DNR/DNI. Patient will need home BiPAP given chronic hypercapnia and has been ordered, will need to follow-up with pulmonary to adjust At this time.  Stable for discharge awaiting for skilled nursing facility placement   Subjective: Seen and examined this morning alert awake wife at the bedside she thinks sometimes he is bit confused having memory problems  Otherwise no complaints, feels ready for discharge to rehab once approved   Assessment and Plan: Principal Problem:   Acute on chronic respiratory failure with hypoxia and hypercapnia (HCC) Active Problems:   COPD (chronic obstructive pulmonary disease) (HCC)   Essential hypertension   Hyperlipidemia   Cancer of  overlapping sites of bladder (HCC)   AKI (acute kidney injury) (HCC)   Stage 3a chronic kidney disease (CKD) (HCC)   Protein-calorie malnutrition, moderate (HCC)   UTI (urinary tract infection)   Chronic respiratory failure with hypoxia (HCC)   Atrial flutter with rapid ventricular response (HCC)   Acute heart failure (HCC)   Acute on chronic respiratory failure with hypoxia and hypercapnia Suspected COPD-long standing remote smoking history RLL pneumonia with possible parapneumonic effusion: S/P BiPAP initially then was on [facility] 3l(home setting),being managed on IV antibiotics for community-acquired pneumonia, iv lasix . On 6/13 became lethargic with ABG still showing hypercapnia PCCM consulted> resumed BiPAP then switched to bedtime, which he is tolerating. Completed antibiotics, given Diamox, Lasix overall respiratory status has been stable, doing well on home oxygen setting. Discussed with pulmonary patient will need BiPAP at bedtime on discharge and will need outpatient follow-up he will need to be discharged on bipap 15/5, set rate 16  Acute metabolic encephalopathy due to hypercapnia Mild cognitive impairment -suspect mild dementia at baseline Mentation much improved ,aao x3.Did not use BiPAP again Cont PT OT, supportive care delirium precaution.  New onset A flutter with  4:1 AV block w/ RVR: EF 45-50% no RWMA small pericardial effusion.TSH stable.Started on metoprolol and Eliquis. heart rate 130s 4:1 block 07/25/22 a.m. so placed on  metoprolol q6h> changed to twice daily watch for hypotension continue metoprolol and follow-up with cardiology as outpatient  Recent UTI, on antibiotics outpatient UA unremarkable  Essential hypertension: BP soft at times on low-dose metoprolol for a flutter amlodipine has been on hold will discontinue on discharge.    Hyperlipidemia: cont  Crestor  Acute CHF w.  Moderately reduced EF: LVEF 45-50% on echo. Seen by cardiology, fluid status improved,  off lasix.  Cont to monitor daily I/O,weight, electrolytes and net balance as below.Keep on  salt/fluid restricted diet and monitor in tele. Net IO Since Admission: -5,300.33 mL [07/27/22 1008]  Filed Weights   07/25/22 0333 07/26/22 0449 07/27/22 0343  Weight: 60.2 kg 60.9 kg 60.7 kg    Urine retention needing in and out cath: Voiding well continue Flomax  Demand ischemia in the setting of CHF and A-flutter, no chest pain  Mild to moderate MR follow-up outpatient  Normocytic anemia: HB stable. was macrocytic, now normocytic.Likely related to chronic issues.No evidence of blood loss hemoglobin stable, avoid anticoagulation. Has known bladder cancer.  Recent Labs  Lab 07/22/22 0136 07/23/22 0159 07/24/22 0141 07/25/22 0224 07/26/22 0213  HGB 10.8* 10.2* 10.0* 9.4* 9.9*  HCT 34.9* 34.2* 32.8* 30.8* 33.2*    Cancer of overlapping sites of bladder:he is due for outpatient follow up this week - may need to be rescheduled   Hypokalemia:Resolved  AKI on CKD 3a: Likely in the setting of patient's infection, dehydration. In ED s/p lasix, bnp was high. Creatinine downtrending nicely monitor  Recent Labs    12/10/21 1155 05/14/22 0930 07/19/22 1619 07/20/22 1354 07/21/22 0648 07/22/22 0136 07/22/22 1505 07/23/22 0159 07/24/22 0141 07/25/22 0224 07/26/22 0213  BUN 24* 31* 34*  --  31* 30* 26* 24* 29* 31* 25*  CREATININE 1.33* 1.24 1.97* 1.92* 1.80* 1.71* 1.78* 1.83* 1.92* 1.98* 1.62*  CO2 31 35* 38*  --  37* 37* 40* 38* 33* 33* 30    Inadequate oral intake, at risk of malnutrition  Nutrition Problem: Inadequate oral intake Etiology: inability to eat Signs/Symptoms: NPO status Interventions: Refer to RD note for recommendations, MVI, Ensure Enlive (each supplement provides 350kcal and 20 grams of protein)  Goals of care patient with advanced age and multiple medical comorbidities, palliative care consultation was requested now changed to DNR/DNI overall prognosis does not  appear bright.  Continue on full scope of treatment  DVT prophylaxis: apixaban (ELIQUIS) tablet 2.5 mg Start: 07/21/22 2200 Code Status:   Code Status: DNR Family Communication: plan of care discussed with patient/wife at bedside. Patient status is: Inpatient because of hypercapnic respiratory failure  Level of care: Progressive   Dispo: The patient is from: Home with wife            Anticipated disposition: SNF.  Wife prefers [facility] Objective: Vitals last 24 hrs: Vitals:   07/26/22 2333 07/26/22 2343 07/27/22 0343 07/27/22 0742  BP: (!) 90/47  (!) 100/57   Pulse: 74  86   Resp: (!) 24  20   Temp: 98 F (36.7 C) 98 F (36.7 C) 98.4 F (36.9 C)   TempSrc: Oral Oral Oral   SpO2: 100%  100% 98%  Weight:   60.7 kg   Height:       Weight change: -0.2 kg  Physical Examination: General exam: AA,, oriented x2-3, appears weak and older than stated HEENT:Oral mucosa moist, Ear/Nose WNL grossly, dentition normal. Respiratory system: bilaterally clear BS, no use of accessory muscle Cardiovascular system: S1 & S2 +, irregular rate. Gastrointestinal system: Abdomen soft, NT,ND,BS+ Nervous System:Alert, awake, moving extremities and grossly nonfocal Extremities: LE ankle edema trace, lower extremities warm Skin: No rashes,no icterus. MSK: small/weak muscle bulk,tone, power   Medications reviewed:  Scheduled Meds:  apixaban  2.5 mg Oral BID   atorvastatin  40 mg Oral  Daily   escitalopram  10 mg Oral Daily   feeding supplement  237 mL Oral BID BM   metoprolol tartrate  25 mg Oral BID   multivitamin with minerals  1 tablet Oral Daily   sodium chloride flush  3 mL Intravenous Q12H   tamsulosin  0.4 mg Oral QPC supper   umeclidinium-vilanterol  1 puff Inhalation Daily   Continuous Infusions:    Diet Order             Diet Heart Room service appropriate? Yes; Fluid consistency: Thin  Diet effective now                  Intake/Output Summary (Last 24 hours) at 07/27/2022  1008 Last data filed at 07/27/2022 0408 Gross per 24 hour  Intake --  Output 1051 ml  Net -1051 ml    Net IO Since Admission: -5,300.33 mL [07/27/22 1008]  Wt Readings from Last 3 Encounters:  07/27/22 60.7 kg  05/27/22 60.2 kg  05/14/22 60.2 kg    Unresulted Labs (From admission, onward)    None     Data Reviewed: I have personally reviewed following labs and imaging studies CBC: Recent Labs  Lab 07/22/22 0136 07/23/22 0159 07/24/22 0141 07/25/22 0224 07/26/22 0213  WBC 14.5* 9.6 8.7 11.6* 12.0*  HGB 10.8* 10.2* 10.0* 9.4* 9.9*  HCT 34.9* 34.2* 32.8* 30.8* 33.2*  MCV 96.7 100.6* 100.3* 99.4 97.6  PLT 214 192 179 179 174    Basic Metabolic Panel: Recent Labs  Lab 07/22/22 0136 07/22/22 1505 07/23/22 0159 07/24/22 0141 07/25/22 0224 07/26/22 0213  NA 137 139 138 139 133* 133*  K 3.3* 3.5 3.1* 3.9 3.7 3.9  CL 88* 87* 89* 95* 92* 90*  CO2 37* 40* 38* 33* 33* 30  GLUCOSE 115* 138* 96 106* 106* 111*  BUN 30* 26* 24* 29* 31* 25*  CREATININE 1.71* 1.78* 1.83* 1.92* 1.98* 1.62*  CALCIUM 8.6* 8.4* 8.5* 8.2* 8.0* 8.3*  MG 2.2  --   --  2.0  --   --    GFR: Recent Results (from the past 240 hour(s))  Blood culture (routine x 2)     Status: None   Collection Time: 07/19/22  4:15 PM   Specimen: BLOOD  Result Value Ref Range Status   Specimen Description   Final    BLOOD RIGHT ANTECUBITAL Performed at [facility], [address], [place], [place] [address]    Special Requests   Final    BOTTLES DRAWN AEROBIC AND ANAEROBIC Blood Culture adequate volume Performed at [facility], [address], [place], [place] [address]    Culture   Final    NO GROWTH 5 DAYS Performed at [facility], [address], [place], [place] [address]    Report Status 07/24/2022 FINAL  Final  Blood culture (routine x 2)     Status: None   Collection Time: 07/19/22  4:18 PM   Specimen: BLOOD  Result Value Ref Range Status   Specimen  Description   Final    BLOOD LEFT ANTECUBITAL Performed at [facility], [address] [place], [place] [address]    Special Requests   Final    BOTTLES DRAWN AEROBIC AND ANAEROBIC Blood Culture adequate volume Performed at [facility], [address], [place], [place] [address]    Culture   Final    NO GROWTH 5 DAYS Performed at [facility], [address],  [place], [place] [address]    Report Status 07/24/2022 FINAL  Final  SARS Coronavirus 2 by RT PCR (hospital order, performed in [facility] hospital lab) *cepheid single result test* Anterior Nasal Swab     Status: None   Collection Time: 07/19/22  4:19 PM   Specimen: Anterior Nasal Swab  Result Value Ref Range Status   SARS Coronavirus 2 by RT PCR NEGATIVE NEGATIVE Final    Comment: (NOTE) SARS-CoV-2 target nucleic acids are NOT DETECTED.  The SARS-CoV-2 RNA is generally detectable in upper and lower respiratory specimens during the acute phase of infection. The lowest concentration of SARS-CoV-2 viral copies this assay can detect is 250 copies / mL. A negative result does not preclude SARS-CoV-2 infection and should not be used as the sole basis for treatment or other patient management decisions.  A negative result may occur with improper specimen collection / handling, submission of specimen other than nasopharyngeal swab, presence of viral mutation(s) within the areas targeted by this assay, and inadequate number of viral copies (<250 copies / mL). A negative result must be combined with clinical observations, patient history, and epidemiological information.  Fact Sheet for Patients:   RoadLapTop.co.za  Fact Sheet for Healthcare Providers: http://kim-miller.com/  This test is not yet approved or  cleared by the [place] FDA and has been authorized for detection and/or diagnosis of SARS-CoV-2 by FDA under an Emergency  Use Authorization (EUA).  This EUA will remain in effect (meaning this test can be used) for the duration of the COVID-19 declaration under Section 564(b)(1) of the Act, 21 U.S.C. section 360bbb-3(b)(1), unless the authorization is terminated or revoked sooner.  Performed at [facility], [address], [place], [place] [address]     Antimicrobials: Anti-infectives (From admission, onward)    Start     Dose/Rate Route Frequency Ordered Stop   07/20/22 1900  azithromycin (ZITHROMAX) 500 mg in sodium chloride 0.9 % 250 mL IVPB        500 mg 250 mL/hr over 60 Minutes Intravenous Every 24 hours 07/20/22 1208 07/23/22 1910   07/20/22 1300  cefTRIAXone (ROCEPHIN) 2 g in sodium chloride 0.9 % 100 mL IVPB        2 g 200 mL/hr over 30 Minutes Intravenous Every 24 hours 07/20/22 1208 07/24/22 1027   07/19/22 1845  cefTRIAXone (ROCEPHIN) 1 g in sodium chloride 0.9 % 100 mL IVPB        1 g 200 mL/hr over 30 Minutes Intravenous  Once 07/19/22 1837 07/19/22 2036   07/19/22 1845  azithromycin (ZITHROMAX) 500 mg in sodium chloride 0.9 % 250 mL IVPB        500 mg 250 mL/hr over 60 Minutes Intravenous  Once 07/19/22 1837 07/19/22 2036      Culture/Microbiology    Component Value Date/Time   SDES  07/19/2022 1618    BLOOD LEFT ANTECUBITAL Performed at [facility], [address] [place], [place] [address]    [facility]  07/19/2022 1618    BOTTLES DRAWN AEROBIC AND ANAEROBIC Blood Culture adequate volume Performed at [facility], [address] [place], [place] [address]    CULT  07/19/2022 1618    NO GROWTH 5 DAYS Performed at [facility], [address] [place], [place] [address]    REPTSTATUS 07/24/2022 FINAL 07/19/2022 1618  Radiology Studies: No results found.   LOS: 7 days   Lanae Boast, MD [facility]  07/27/2022, 10:08 AM

## 2022-07-27 NOTE — Progress Notes (Signed)
[facility] called and report given to [place], Charity fundraiser.

## 2022-07-27 NOTE — Progress Notes (Signed)
Bipap settings needed for discharge: 15/5 RR 20 30%.

## 2022-07-28 DIAGNOSIS — R41841 Cognitive communication deficit: Secondary | ICD-10-CM | POA: Diagnosis not present

## 2022-07-28 DIAGNOSIS — J449 Chronic obstructive pulmonary disease, unspecified: Secondary | ICD-10-CM | POA: Diagnosis not present

## 2022-07-28 DIAGNOSIS — M6259 Muscle wasting and atrophy, not elsewhere classified, multiple sites: Secondary | ICD-10-CM | POA: Diagnosis not present

## 2022-07-28 DIAGNOSIS — R2681 Unsteadiness on feet: Secondary | ICD-10-CM | POA: Diagnosis not present

## 2022-07-28 DIAGNOSIS — J9621 Acute and chronic respiratory failure with hypoxia: Secondary | ICD-10-CM | POA: Diagnosis not present

## 2022-07-28 DIAGNOSIS — Z741 Need for assistance with personal care: Secondary | ICD-10-CM | POA: Diagnosis not present

## 2022-07-28 DIAGNOSIS — M6281 Muscle weakness (generalized): Secondary | ICD-10-CM | POA: Diagnosis not present

## 2022-07-28 DIAGNOSIS — I509 Heart failure, unspecified: Secondary | ICD-10-CM | POA: Diagnosis not present

## 2022-08-01 ENCOUNTER — Other Ambulatory Visit: Payer: Self-pay

## 2022-08-01 ENCOUNTER — Inpatient Hospital Stay (HOSPITAL_COMMUNITY)
Admission: EM | Admit: 2022-08-01 | Discharge: 2022-08-06 | DRG: 698 | Disposition: A | Discharge status: Home Health | Payer: Medicare Other | Attending: Internal Medicine | Admitting: Internal Medicine

## 2022-08-01 ENCOUNTER — Encounter (HOSPITAL_COMMUNITY): Payer: Self-pay

## 2022-08-01 ENCOUNTER — Emergency Department (HOSPITAL_COMMUNITY): Payer: Medicare Other

## 2022-08-01 DIAGNOSIS — I440 Atrioventricular block, first degree: Secondary | ICD-10-CM | POA: Diagnosis present

## 2022-08-01 DIAGNOSIS — Z85828 Personal history of other malignant neoplasm of skin: Secondary | ICD-10-CM

## 2022-08-01 DIAGNOSIS — I5022 Chronic systolic (congestive) heart failure: Secondary | ICD-10-CM | POA: Diagnosis present

## 2022-08-01 DIAGNOSIS — Z8711 Personal history of peptic ulcer disease: Secondary | ICD-10-CM

## 2022-08-01 DIAGNOSIS — J449 Chronic obstructive pulmonary disease, unspecified: Secondary | ICD-10-CM

## 2022-08-01 DIAGNOSIS — R531 Weakness: Secondary | ICD-10-CM | POA: Diagnosis not present

## 2022-08-01 DIAGNOSIS — J9621 Acute and chronic respiratory failure with hypoxia: Secondary | ICD-10-CM | POA: Diagnosis not present

## 2022-08-01 DIAGNOSIS — I4892 Unspecified atrial flutter: Secondary | ICD-10-CM | POA: Diagnosis not present

## 2022-08-01 DIAGNOSIS — Z8679 Personal history of other diseases of the circulatory system: Secondary | ICD-10-CM | POA: Diagnosis not present

## 2022-08-01 DIAGNOSIS — R404 Transient alteration of awareness: Secondary | ICD-10-CM | POA: Diagnosis not present

## 2022-08-01 DIAGNOSIS — Z8249 Family history of ischemic heart disease and other diseases of the circulatory system: Secondary | ICD-10-CM

## 2022-08-01 DIAGNOSIS — J9622 Acute and chronic respiratory failure with hypercapnia: Secondary | ICD-10-CM | POA: Diagnosis present

## 2022-08-01 DIAGNOSIS — F03A0 Unspecified dementia, mild, without behavioral disturbance, psychotic disturbance, mood disturbance, and anxiety: Secondary | ICD-10-CM | POA: Diagnosis present

## 2022-08-01 DIAGNOSIS — I130 Hypertensive heart and chronic kidney disease with heart failure and stage 1 through stage 4 chronic kidney disease, or unspecified chronic kidney disease: Secondary | ICD-10-CM | POA: Diagnosis present

## 2022-08-01 DIAGNOSIS — J9612 Chronic respiratory failure with hypercapnia: Secondary | ICD-10-CM | POA: Diagnosis not present

## 2022-08-01 DIAGNOSIS — R2681 Unsteadiness on feet: Secondary | ICD-10-CM | POA: Diagnosis not present

## 2022-08-01 DIAGNOSIS — E871 Hypo-osmolality and hyponatremia: Secondary | ICD-10-CM | POA: Diagnosis not present

## 2022-08-01 DIAGNOSIS — K219 Gastro-esophageal reflux disease without esophagitis: Secondary | ICD-10-CM | POA: Diagnosis not present

## 2022-08-01 DIAGNOSIS — T83511A Infection and inflammatory reaction due to indwelling urethral catheter, initial encounter: Principal | ICD-10-CM | POA: Diagnosis present

## 2022-08-01 DIAGNOSIS — I5042 Chronic combined systolic (congestive) and diastolic (congestive) heart failure: Secondary | ICD-10-CM | POA: Diagnosis present

## 2022-08-01 DIAGNOSIS — G9341 Metabolic encephalopathy: Secondary | ICD-10-CM | POA: Diagnosis present

## 2022-08-01 DIAGNOSIS — I480 Paroxysmal atrial fibrillation: Secondary | ICD-10-CM | POA: Diagnosis not present

## 2022-08-01 DIAGNOSIS — Z66 Do not resuscitate: Secondary | ICD-10-CM | POA: Diagnosis present

## 2022-08-01 DIAGNOSIS — J9601 Acute respiratory failure with hypoxia: Secondary | ICD-10-CM | POA: Diagnosis not present

## 2022-08-01 DIAGNOSIS — I10 Essential (primary) hypertension: Secondary | ICD-10-CM | POA: Diagnosis present

## 2022-08-01 DIAGNOSIS — Z6822 Body mass index (BMI) 22.0-22.9, adult: Secondary | ICD-10-CM

## 2022-08-01 DIAGNOSIS — G9340 Encephalopathy, unspecified: Secondary | ICD-10-CM | POA: Diagnosis not present

## 2022-08-01 DIAGNOSIS — N179 Acute kidney failure, unspecified: Secondary | ICD-10-CM

## 2022-08-01 DIAGNOSIS — I6782 Cerebral ischemia: Secondary | ICD-10-CM | POA: Diagnosis not present

## 2022-08-01 DIAGNOSIS — D62 Acute posthemorrhagic anemia: Secondary | ICD-10-CM | POA: Diagnosis present

## 2022-08-01 DIAGNOSIS — E440 Moderate protein-calorie malnutrition: Secondary | ICD-10-CM | POA: Diagnosis present

## 2022-08-01 DIAGNOSIS — I2510 Atherosclerotic heart disease of native coronary artery without angina pectoris: Secondary | ICD-10-CM | POA: Diagnosis present

## 2022-08-01 DIAGNOSIS — N3001 Acute cystitis with hematuria: Secondary | ICD-10-CM | POA: Diagnosis not present

## 2022-08-01 DIAGNOSIS — N3000 Acute cystitis without hematuria: Secondary | ICD-10-CM | POA: Diagnosis present

## 2022-08-01 DIAGNOSIS — N1831 Chronic kidney disease, stage 3a: Secondary | ICD-10-CM | POA: Diagnosis present

## 2022-08-01 DIAGNOSIS — R319 Hematuria, unspecified: Secondary | ICD-10-CM

## 2022-08-01 DIAGNOSIS — I959 Hypotension, unspecified: Secondary | ICD-10-CM | POA: Diagnosis not present

## 2022-08-01 DIAGNOSIS — Z741 Need for assistance with personal care: Secondary | ICD-10-CM | POA: Diagnosis not present

## 2022-08-01 DIAGNOSIS — M6259 Muscle wasting and atrophy, not elsewhere classified, multiple sites: Secondary | ICD-10-CM | POA: Diagnosis not present

## 2022-08-01 DIAGNOSIS — J432 Centrilobular emphysema: Secondary | ICD-10-CM | POA: Diagnosis present

## 2022-08-01 DIAGNOSIS — K5730 Diverticulosis of large intestine without perforation or abscess without bleeding: Secondary | ICD-10-CM | POA: Diagnosis not present

## 2022-08-01 DIAGNOSIS — Z9981 Dependence on supplemental oxygen: Secondary | ICD-10-CM

## 2022-08-01 DIAGNOSIS — E782 Mixed hyperlipidemia: Secondary | ICD-10-CM | POA: Diagnosis not present

## 2022-08-01 DIAGNOSIS — D6832 Hemorrhagic disorder due to extrinsic circulating anticoagulants: Secondary | ICD-10-CM | POA: Diagnosis present

## 2022-08-01 DIAGNOSIS — Z79899 Other long term (current) drug therapy: Secondary | ICD-10-CM

## 2022-08-01 DIAGNOSIS — R9089 Other abnormal findings on diagnostic imaging of central nervous system: Secondary | ICD-10-CM | POA: Diagnosis not present

## 2022-08-01 DIAGNOSIS — Z87891 Personal history of nicotine dependence: Secondary | ICD-10-CM

## 2022-08-01 DIAGNOSIS — C679 Malignant neoplasm of bladder, unspecified: Secondary | ICD-10-CM | POA: Diagnosis present

## 2022-08-01 DIAGNOSIS — Y846 Urinary catheterization as the cause of abnormal reaction of the patient, or of later complication, without mention of misadventure at the time of the procedure: Secondary | ICD-10-CM | POA: Diagnosis present

## 2022-08-01 DIAGNOSIS — G253 Myoclonus: Secondary | ICD-10-CM | POA: Diagnosis not present

## 2022-08-01 DIAGNOSIS — Z7901 Long term (current) use of anticoagulants: Secondary | ICD-10-CM

## 2022-08-01 DIAGNOSIS — E785 Hyperlipidemia, unspecified: Secondary | ICD-10-CM | POA: Diagnosis present

## 2022-08-01 DIAGNOSIS — R410 Disorientation, unspecified: Secondary | ICD-10-CM | POA: Diagnosis not present

## 2022-08-01 DIAGNOSIS — Z8551 Personal history of malignant neoplasm of bladder: Secondary | ICD-10-CM | POA: Diagnosis not present

## 2022-08-01 DIAGNOSIS — R41841 Cognitive communication deficit: Secondary | ICD-10-CM | POA: Diagnosis not present

## 2022-08-01 DIAGNOSIS — J441 Chronic obstructive pulmonary disease with (acute) exacerbation: Secondary | ICD-10-CM | POA: Diagnosis present

## 2022-08-01 DIAGNOSIS — R569 Unspecified convulsions: Secondary | ICD-10-CM | POA: Diagnosis not present

## 2022-08-01 DIAGNOSIS — I700 Atherosclerosis of aorta: Secondary | ICD-10-CM | POA: Diagnosis not present

## 2022-08-01 DIAGNOSIS — M6281 Muscle weakness (generalized): Secondary | ICD-10-CM | POA: Diagnosis not present

## 2022-08-01 DIAGNOSIS — J90 Pleural effusion, not elsewhere classified: Secondary | ICD-10-CM | POA: Diagnosis not present

## 2022-08-01 DIAGNOSIS — R918 Other nonspecific abnormal finding of lung field: Secondary | ICD-10-CM | POA: Diagnosis not present

## 2022-08-01 LAB — BASIC METABOLIC PANEL
Anion gap: 12 (ref 5–15)
BUN: 16 mg/dL (ref 8–23)
CO2: 31 mmol/L (ref 22–32)
Calcium: 8.9 mg/dL (ref 8.9–10.3)
Chloride: 91 mmol/L — ABNORMAL LOW (ref 98–111)
Creatinine, Ser: 1.12 mg/dL (ref 0.61–1.24)
GFR, Estimated: >60 mL/min (ref >60)
Glucose, Bld: 114 mg/dL — ABNORMAL HIGH (ref 70–99)
Potassium: 5 mmol/L (ref 3.5–5.1)
Sodium: 134 mmol/L — ABNORMAL LOW (ref 135–145)

## 2022-08-01 LAB — URINALYSIS, ROUTINE W REFLEX MICROSCOPIC

## 2022-08-01 LAB — CBC
HCT: 31 % — ABNORMAL LOW (ref 39.0–52.0)
Hemoglobin: 9.3 g/dL — ABNORMAL LOW (ref 13.0–17.0)
MCH: 29.1 pg (ref 26.0–34.0)
MCHC: 30 g/dL (ref 30.0–36.0)
MCV: 96.9 fL (ref 80.0–100.0)
Platelets: 207 10*3/uL (ref 150–400)
RBC: 3.2 MIL/uL — ABNORMAL LOW (ref 4.22–5.81)
RDW: 13.2 % (ref 11.5–15.5)
WBC: 10.2 10*3/uL (ref 4.0–10.5)
nRBC: 0 % (ref 0.0–0.2)

## 2022-08-01 LAB — I-STAT VENOUS BLOOD GAS, ED
Acid-Base Excess: 9 mmol/L — ABNORMAL HIGH (ref 0.0–2.0)
Bicarbonate: 35.8 mmol/L — ABNORMAL HIGH (ref 20.0–28.0)
Calcium, Ion: 1.13 mmol/L — ABNORMAL LOW (ref 1.15–1.40)
HCT: 31 % — ABNORMAL LOW (ref 39.0–52.0)
Hemoglobin: 10.5 g/dL — ABNORMAL LOW (ref 13.0–17.0)
O2 Saturation: 70 %
Potassium: 4.9 mmol/L (ref 3.5–5.1)
Sodium: 132 mmol/L — ABNORMAL LOW (ref 135–145)
TCO2: 38 mmol/L — ABNORMAL HIGH (ref 22–32)
pCO2, Ven: 62 mmHg — ABNORMAL HIGH (ref 44–60)
pH, Ven: 7.369 (ref 7.25–7.43)
pO2, Ven: 39 mmHg (ref 32–45)

## 2022-08-01 LAB — URINALYSIS, MICROSCOPIC (REFLEX): RBC / HPF: >50 RBC/hpf (ref 0–5)

## 2022-08-01 LAB — CBG MONITORING, ED: Glucose-Capillary: 99 mg/dL (ref 70–99)

## 2022-08-01 MED ORDER — SODIUM CHLORIDE 0.9 % IV SOLN
1.0000 g | Freq: Once | INTRAVENOUS | Status: AC
  Administered 2022-08-01: 1 g via INTRAVENOUS
  Filled 2022-08-01: qty 10

## 2022-08-01 MED ORDER — IOHEXOL 350 MG/ML SOLN
60.0000 mL | Freq: Once | INTRAVENOUS | Status: AC | PRN
  Administered 2022-08-01: 60 mL via INTRAVENOUS

## 2022-08-01 NOTE — ED Notes (Signed)
Shift report received, assumed care of patient at this time.  

## 2022-08-01 NOTE — ED Triage Notes (Addendum)
Patient arrived by [facility] from [facility] for reported seizures x 4 in 30 minutes with no hx of same. Patient able to answer questions and arrived alert and looking around. DNR Noted right arm tremor Patient had existing foley for bladder cancer and blood noted in same and on thinners CBG   138

## 2022-08-01 NOTE — ED Notes (Signed)
Pt was previously admitted and was started on a blood thinner per report from EMS. Pt has a foley in place with bright red urine. EMS states pt was started a thinner d/t having clots in urine vs UTI. Family should be en route to hospital.

## 2022-08-01 NOTE — ED Notes (Signed)
EMS vitals   BP120/50 HR 88 RR 20 3 L [place] @ 95% CBG 138  GCS 14

## 2022-08-01 NOTE — ED Notes (Signed)
Patient transported to MRI 

## 2022-08-01 NOTE — ED Notes (Signed)
While RN getting blood draw pt started to tremor while eyes are closed. RN woke pt up and asked if he was alright. Pt states he is fine and didn't realize he had a tremor.

## 2022-08-01 NOTE — ED Provider Notes (Signed)
[facility] AT [facility] Provider Note   CSN: [id number] Arrival date & time: 08/01/22  1326     History  Chief Complaint  Patient presents with   Seizures    Gerald Valdez is a 89 y.o. male.  HPI 89 yo male ho history of COPD, hypertension, GI bleeding, atherosclerosis, coronary artery disease, chronic kidney disease, chronic indwelling Foley presents today with reports of possible seizure.  EMS reports that they were told that he had 4 seizures in 30 minutes.  They report that he was awake and had some tremors.  Mother reports that daughter and wife were there. Patient with recent admission for  respiratory failure with hyperarbia,  essential hypertension, hyperlipidemia, bladder ca, ckd, atrial flutter Daughter describes episode of tonic-clonic with episode of decreased responsiveness afterwards.    Home Medications Prior to Admission medications   Medication Sig Start Date End Date Taking? Authorizing Provider  apixaban (ELIQUIS) 2.5 MG TABS tablet Take 1 tablet (2.5 mg total) by mouth 2 (two) times daily. 07/27/22   Lanae Boast, MD  ascorbic acid (VITAMIN C) 500 MG tablet Take 500 mg by mouth daily.    [provider]  atorvastatin (LIPITOR) 40 MG tablet Take 1 tablet (40 mg total) by mouth daily. 07/28/22   Lanae Boast, MD  escitalopram (LEXAPRO) 10 MG tablet Take 10 mg by mouth daily.    [provider]  feeding supplement (ENSURE ENLIVE / ENSURE PLUS) LIQD Take 237 mLs by mouth 2 (two) times daily between meals. 07/27/22   Lanae Boast, MD  ferrous sulfate 325 (65 FE) MG tablet Take 325 mg by mouth daily with breakfast.    [provider]  loratadine (CLARITIN) 10 MG tablet Take 10 mg by mouth daily.    [provider]  metoprolol tartrate (LOPRESSOR) 25 MG tablet Take 0.5 tablets (12.5 mg total) by mouth 2 (two) times daily. 07/28/22   Darlin Drop, DO  Multiple Vitamin (MULTIVITAMIN WITH MINERALS) TABS tablet  Take 1 tablet by mouth daily with breakfast.    [provider]  OXYGEN Inhale 3 L into the lungs continuous.    [provider]  polyethylene glycol (MIRALAX / GLYCOLAX) 17 g packet Take 17 g by mouth daily as needed for mild constipation. 07/27/22   Lanae Boast, MD  saccharomyces boulardii (FLORASTOR) 250 MG capsule Take 1 capsule (250 mg total) by mouth 2 (two) times daily. Patient taking differently: Take 250 mg by mouth daily. 07/05/21   Leatha Gilding, MD  tamsulosin (FLOMAX) 0.4 MG CAPS capsule Take 1 capsule (0.4 mg total) by mouth daily after supper. 07/27/22   Lanae Boast, MD  umeclidinium-vilanterol (ANORO ELLIPTA) 62.5-25 MCG/ACT AEPB Inhale 1 puff into the lungs daily. 07/28/22   Lanae Boast, MD      Allergies    Patient has no known allergies.    Review of Systems   Review of Systems  Physical Exam Updated Vital Signs BP 115/62 (BP Location: Right Arm)   Pulse 85   Temp 98.4 F (36.9 C) (Oral)   Resp 15   SpO2 100%  Physical Exam Vitals and nursing note reviewed.  HENT:     Head: Normocephalic and atraumatic.     Right Ear: External ear normal.     Left Ear: External ear normal.     Nose: Nose normal.     Mouth/Throat:     Pharynx: Oropharynx is clear.  Eyes:     Pupils:  Pupils are equal, round, and reactive to light.  Cardiovascular:     Rate and Rhythm: Normal rate and regular rhythm.     Pulses: Normal pulses.  Pulmonary:     Effort: Pulmonary effort is normal.     Breath sounds: Normal breath sounds.  Abdominal:     General: Bowel sounds are normal.  Musculoskeletal:        General: Normal range of motion.     Cervical back: Normal range of motion.  Skin:    General: Skin is warm.     Capillary Refill: Capillary refill takes less than 2 seconds.  Neurological:     General: No focal deficit present.     Mental Status: He is alert.     Motor: No weakness.     Gait: Gait normal.  Psychiatric:        Mood and Affect: Mood normal.      ED Results / Procedures / Treatments   Labs (all labs ordered are listed, but only abnormal results are displayed) Labs Reviewed  CBC - Abnormal; Notable for the following components:      Result Value   RBC 3.20 (*)    Hemoglobin 9.3 (*)    HCT 31.0 (*)    All other components within normal limits  BASIC METABOLIC PANEL - Abnormal; Notable for the following components:   Sodium 134 (*)    Chloride 91 (*)    Glucose, Bld 114 (*)    All other components within normal limits  I-STAT VENOUS BLOOD GAS, ED - Abnormal; Notable for the following components:   pCO2, Ven 62.0 (*)    Bicarbonate 35.8 (*)    TCO2 38 (*)    Acid-Base Excess 9.0 (*)    Sodium 132 (*)    Calcium, Ion 1.13 (*)    HCT 31.0 (*)    Hemoglobin 10.5 (*)    All other components within normal limits  BLOOD GAS, VENOUS  URINALYSIS, ROUTINE W REFLEX MICROSCOPIC  CBG MONITORING, ED    EKG EKG Interpretation  Date/Time:  Sunday August 01 2022 13:42:50 EDT Ventricular Rate:  86 PR Interval:  183 QRS Duration: 83 QT Interval:  366 QTC Calculation: 438 R Axis:   76 Text Interpretation: Sinus rhythm No significant change since last tracing Confirmed by Margarita Grizzle [phone]) on 08/01/2022 1:46:23 PM  Radiology DG Chest Port 1 View  Result Date: 08/01/2022 CLINICAL DATA:  Weakness EXAM: PORTABLE CHEST 1 VIEW COMPARISON:  07/19/2022 FINDINGS: Stable cardiomediastinal contours. Aortic atherosclerosis. Improved aeration at the right lung base. No new focal airspace consolidation. No pleural effusion or pneumothorax. IMPRESSION: No active disease. Electronically Signed   By: Duanne Guess D.O.   On: 08/01/2022 14:39    Procedures Procedures    Medications Ordered in ED Medications - No data to display  ED Course/ Medical Decision Making/ A&P Clinical Course as of 08/01/22 1519  Sun Aug 01, 2022  1507 Chest x-Judine Arciniega reviewed interpreted within normal limits [DR]  1508 Basic metabolic panel reviewed  interpreted significant for sodium 134 chloride 91 glucose 114 otherwise within normal limits [DR]  1508 CBC reviewed interpreted with normal white blood cell, stable anemia [DR]    Clinical Course User Index [DR] Margarita Grizzle, MD                             Medical Decision Making Amount and/or Complexity of Data Reviewed Labs: ordered. Radiology: ordered.  89 year old male recent admission for chronic respiratory failure with hypoxia and hypercapnia, metabolic encephalopathy, new onset a flutter, UTI with recent bladder cancer and hematuria, COPD who was urged to nursing facility on 07/27/2022.  He was up and or walking this morning with physical therapy.  During the day he has become somewhat more confused.  He then had an episode of shaking. Differential diagnosis includes but is not limited to new onset of seizure, acute intracranial abnormality such as bleeding, metabolic etiologies such as infection or electrolyte abnormality, hypoglycemia VBG obtained and pCO2 is at 62 with normal pH of 7.36 this appears to be at baseline with review of prior ABGs and VBG's. Sodium is 134, doubt that is significant to cause seizures or have acute abnormality Rectal temp will be checked and urinalysis is ordered DNR at bedside Discussed with Dr. Wallace Cullens who assumes care  No recent urine culture seen Recheck urinalysis        Final Clinical Impression(s) / ED Diagnoses Final diagnoses:  Seizure-like activity [facility])    Rx / DC Orders ED Discharge Orders     None         Margarita Grizzle, MD 08/01/22 1519

## 2022-08-01 NOTE — ED Provider Notes (Signed)
  Provider Note MRN:  [id number]  Arrival date & time: 08/02/22    ED Course and Medical Decision Making  Assumed care from Dr Rosalia Hammers at shift change.  See note from prior team for complete details, in brief:  89 yo male Lives at [facility] Recent bladder cancer Ams with physical therapy, daughter witnessed episode of shaking/rigidity/confusion Labs similar to baseline CTH, foley, temp pending Similar to baseline currently   Plan per prior physician f/u labs/imaging  Clinical Course as of 08/02/22 0015  Sun Aug 01, 2022  1507 Chest x-ray reviewed interpreted within normal limits [DR]  1508 Basic metabolic panel reviewed interpreted significant for sodium 134 chloride 91 glucose 114 otherwise within normal limits [DR]  1508 CBC reviewed interpreted with normal white blood cell, stable anemia [DR]  2011 Pt re-assessed at bedside, per family still not back to baseline. More confused that his baseline. He is able to speak and follow commands but has disoriented and intermittently confused per family. No further seizure activity while in ED or per family. Will get MRI brain w/o given persistent encephalopathy. Pt also with gross hematuria, his is on [facility] for afib. Hx bladder cancer. Will get CTAP and re-assess. Gross blood and rara bacteria noted on UA, will send culture and give rocephin. Will d/w urology [SG]  2249 MRI stable [SG]  2330 Hemoglobin(!): 9.3 Similar to baseline, family reports ongoing bleeding over last few days.  [SG]  2333 Spoke with neuro Dr Derry Lory, recommend routine EEG, if EEG neg would not start AED, recommend continued obs as he is improving, if any change would re-consult [SG]    Clinical Course User Index [DR] Margarita Grizzle, MD [SG] Tanda Rockers A, DO    Pt with ?UTI, send culture, hx bladder ca, recent bladder surgery, hematuria ongoing. HGB stable MRI stable, not back to baseline, d/w neuro, see above Will admit to hospitalist given encephalopathy/prolonged post  ictal period   Procedures  Final Clinical Impressions(s) / ED Diagnoses     ICD-10-CM   1. Seizure-like activity (HCC)  R56.9     2. Encephalopathy  G93.40     3. Hematuria, unspecified type  R31.9       ED Discharge Orders     None       Discharge Instructions   None        Sloan Leiter, DO 08/03/22 [phone]

## 2022-08-02 ENCOUNTER — Encounter (HOSPITAL_COMMUNITY): Payer: Self-pay | Admitting: Internal Medicine

## 2022-08-02 ENCOUNTER — Inpatient Hospital Stay: Payer: Medicare Other | Admitting: Oncology

## 2022-08-02 ENCOUNTER — Inpatient Hospital Stay (HOSPITAL_COMMUNITY): Payer: Medicare Other

## 2022-08-02 DIAGNOSIS — I480 Paroxysmal atrial fibrillation: Secondary | ICD-10-CM | POA: Diagnosis present

## 2022-08-02 DIAGNOSIS — T83511A Infection and inflammatory reaction due to indwelling urethral catheter, initial encounter: Secondary | ICD-10-CM | POA: Diagnosis present

## 2022-08-02 DIAGNOSIS — G9341 Metabolic encephalopathy: Secondary | ICD-10-CM

## 2022-08-02 DIAGNOSIS — D62 Acute posthemorrhagic anemia: Secondary | ICD-10-CM | POA: Diagnosis present

## 2022-08-02 DIAGNOSIS — N3000 Acute cystitis without hematuria: Secondary | ICD-10-CM | POA: Diagnosis present

## 2022-08-02 DIAGNOSIS — G9340 Encephalopathy, unspecified: Secondary | ICD-10-CM | POA: Diagnosis present

## 2022-08-02 DIAGNOSIS — F03A0 Unspecified dementia, mild, without behavioral disturbance, psychotic disturbance, mood disturbance, and anxiety: Secondary | ICD-10-CM | POA: Diagnosis present

## 2022-08-02 DIAGNOSIS — E785 Hyperlipidemia, unspecified: Secondary | ICD-10-CM | POA: Diagnosis present

## 2022-08-02 DIAGNOSIS — I10 Essential (primary) hypertension: Secondary | ICD-10-CM | POA: Diagnosis not present

## 2022-08-02 DIAGNOSIS — G253 Myoclonus: Secondary | ICD-10-CM | POA: Diagnosis present

## 2022-08-02 DIAGNOSIS — N1831 Chronic kidney disease, stage 3a: Secondary | ICD-10-CM | POA: Diagnosis present

## 2022-08-02 DIAGNOSIS — R569 Unspecified convulsions: Secondary | ICD-10-CM | POA: Diagnosis not present

## 2022-08-02 DIAGNOSIS — J9601 Acute respiratory failure with hypoxia: Secondary | ICD-10-CM | POA: Diagnosis not present

## 2022-08-02 DIAGNOSIS — I4892 Unspecified atrial flutter: Secondary | ICD-10-CM | POA: Diagnosis present

## 2022-08-02 DIAGNOSIS — J9612 Chronic respiratory failure with hypercapnia: Secondary | ICD-10-CM

## 2022-08-02 DIAGNOSIS — E782 Mixed hyperlipidemia: Secondary | ICD-10-CM

## 2022-08-02 DIAGNOSIS — Z8551 Personal history of malignant neoplasm of bladder: Secondary | ICD-10-CM | POA: Diagnosis not present

## 2022-08-02 DIAGNOSIS — Z66 Do not resuscitate: Secondary | ICD-10-CM | POA: Diagnosis present

## 2022-08-02 DIAGNOSIS — I130 Hypertensive heart and chronic kidney disease with heart failure and stage 1 through stage 4 chronic kidney disease, or unspecified chronic kidney disease: Secondary | ICD-10-CM | POA: Diagnosis present

## 2022-08-02 DIAGNOSIS — J441 Chronic obstructive pulmonary disease with (acute) exacerbation: Secondary | ICD-10-CM | POA: Diagnosis present

## 2022-08-02 DIAGNOSIS — N3001 Acute cystitis with hematuria: Secondary | ICD-10-CM | POA: Diagnosis present

## 2022-08-02 DIAGNOSIS — J449 Chronic obstructive pulmonary disease, unspecified: Secondary | ICD-10-CM | POA: Diagnosis not present

## 2022-08-02 DIAGNOSIS — R918 Other nonspecific abnormal finding of lung field: Secondary | ICD-10-CM | POA: Diagnosis not present

## 2022-08-02 DIAGNOSIS — C679 Malignant neoplasm of bladder, unspecified: Secondary | ICD-10-CM | POA: Diagnosis present

## 2022-08-02 DIAGNOSIS — I2510 Atherosclerotic heart disease of native coronary artery without angina pectoris: Secondary | ICD-10-CM | POA: Diagnosis present

## 2022-08-02 DIAGNOSIS — K219 Gastro-esophageal reflux disease without esophagitis: Secondary | ICD-10-CM | POA: Diagnosis not present

## 2022-08-02 DIAGNOSIS — J432 Centrilobular emphysema: Secondary | ICD-10-CM | POA: Diagnosis present

## 2022-08-02 DIAGNOSIS — J9621 Acute and chronic respiratory failure with hypoxia: Secondary | ICD-10-CM | POA: Diagnosis present

## 2022-08-02 DIAGNOSIS — Z8679 Personal history of other diseases of the circulatory system: Secondary | ICD-10-CM

## 2022-08-02 DIAGNOSIS — Y846 Urinary catheterization as the cause of abnormal reaction of the patient, or of later complication, without mention of misadventure at the time of the procedure: Secondary | ICD-10-CM | POA: Diagnosis present

## 2022-08-02 DIAGNOSIS — D6832 Hemorrhagic disorder due to extrinsic circulating anticoagulants: Secondary | ICD-10-CM | POA: Diagnosis present

## 2022-08-02 DIAGNOSIS — I5022 Chronic systolic (congestive) heart failure: Secondary | ICD-10-CM | POA: Diagnosis present

## 2022-08-02 DIAGNOSIS — I5042 Chronic combined systolic (congestive) and diastolic (congestive) heart failure: Secondary | ICD-10-CM | POA: Diagnosis present

## 2022-08-02 DIAGNOSIS — J90 Pleural effusion, not elsewhere classified: Secondary | ICD-10-CM | POA: Diagnosis not present

## 2022-08-02 DIAGNOSIS — J9622 Acute and chronic respiratory failure with hypercapnia: Secondary | ICD-10-CM | POA: Diagnosis present

## 2022-08-02 DIAGNOSIS — I440 Atrioventricular block, first degree: Secondary | ICD-10-CM | POA: Diagnosis present

## 2022-08-02 DIAGNOSIS — E440 Moderate protein-calorie malnutrition: Secondary | ICD-10-CM | POA: Diagnosis present

## 2022-08-02 DIAGNOSIS — E871 Hypo-osmolality and hyponatremia: Secondary | ICD-10-CM | POA: Diagnosis present

## 2022-08-02 DIAGNOSIS — I700 Atherosclerosis of aorta: Secondary | ICD-10-CM | POA: Diagnosis present

## 2022-08-02 LAB — I-STAT ARTERIAL BLOOD GAS, ED
Acid-Base Excess: 10 mmol/L — ABNORMAL HIGH (ref 0.0–2.0)
Acid-Base Excess: 9 mmol/L — ABNORMAL HIGH (ref 0.0–2.0)
Bicarbonate: 37.5 mmol/L — ABNORMAL HIGH (ref 20.0–28.0)
Bicarbonate: 36.5 mmol/L — ABNORMAL HIGH (ref 20.0–28.0)
Calcium, Ion: 1.23 mmol/L (ref 1.15–1.40)
Calcium, Ion: 1.26 mmol/L (ref 1.15–1.40)
HCT: 29 % — ABNORMAL LOW (ref 39.0–52.0)
HCT: 30 % — ABNORMAL LOW (ref 39.0–52.0)
Hemoglobin: 9.9 g/dL — ABNORMAL LOW (ref 13.0–17.0)
Hemoglobin: 10.2 g/dL — ABNORMAL LOW (ref 13.0–17.0)
O2 Saturation: 98 %
O2 Saturation: 93 %
Patient temperature: 98.6
Patient temperature: 98.6
Potassium: 4.8 mmol/L (ref 3.5–5.1)
Potassium: 4.9 mmol/L (ref 3.5–5.1)
Sodium: 132 mmol/L — ABNORMAL LOW (ref 135–145)
Sodium: 132 mmol/L — ABNORMAL LOW (ref 135–145)
TCO2: 40 mmol/L — ABNORMAL HIGH (ref 22–32)
TCO2: 39 mmol/L — ABNORMAL HIGH (ref 22–32)
pCO2 arterial: 71.6 mmHg (ref 32–48)
pCO2 arterial: 69.6 mmHg (ref 32–48)
pH, Arterial: 7.327 — ABNORMAL LOW (ref 7.35–7.45)
pH, Arterial: 7.328 — ABNORMAL LOW (ref 7.35–7.45)
pO2, Arterial: 116 mmHg — ABNORMAL HIGH (ref 83–108)
pO2, Arterial: 73 mmHg — ABNORMAL LOW (ref 83–108)

## 2022-08-02 LAB — COMPREHENSIVE METABOLIC PANEL
ALT: 35 U/L (ref 0–44)
AST: 33 U/L (ref 15–41)
Albumin: 3 g/dL — ABNORMAL LOW (ref 3.5–5.0)
Alkaline Phosphatase: 32 U/L — ABNORMAL LOW (ref 38–126)
Anion gap: 9 (ref 5–15)
BUN: 15 mg/dL (ref 8–23)
CO2: 29 mmol/L (ref 22–32)
Calcium: 8.5 mg/dL — ABNORMAL LOW (ref 8.9–10.3)
Chloride: 93 mmol/L — ABNORMAL LOW (ref 98–111)
Creatinine, Ser: 1.04 mg/dL (ref 0.61–1.24)
GFR, Estimated: >60 mL/min (ref >60)
Glucose, Bld: 73 mg/dL (ref 70–99)
Potassium: 5.1 mmol/L (ref 3.5–5.1)
Sodium: 131 mmol/L — ABNORMAL LOW (ref 135–145)
Total Bilirubin: 0.6 mg/dL (ref 0.3–1.2)
Total Protein: 5.2 g/dL — ABNORMAL LOW (ref 6.5–8.1)

## 2022-08-02 LAB — I-STAT VENOUS BLOOD GAS, ED
Acid-Base Excess: 5 mmol/L — ABNORMAL HIGH (ref 0.0–2.0)
Bicarbonate: 31.9 mmol/L — ABNORMAL HIGH (ref 20.0–28.0)
Calcium, Ion: 1.03 mmol/L — ABNORMAL LOW (ref 1.15–1.40)
HCT: 26 % — ABNORMAL LOW (ref 39.0–52.0)
Hemoglobin: 8.8 g/dL — ABNORMAL LOW (ref 13.0–17.0)
O2 Saturation: 98 %
Potassium: 4.1 mmol/L (ref 3.5–5.1)
Sodium: 136 mmol/L (ref 135–145)
TCO2: 34 mmol/L — ABNORMAL HIGH (ref 22–32)
pCO2, Ven: 59.2 mmHg (ref 44–60)
pH, Ven: 7.339 (ref 7.25–7.43)
pO2, Ven: 119 mmHg — ABNORMAL HIGH (ref 32–45)

## 2022-08-02 MED ORDER — FERROUS SULFATE 325 (65 FE) MG PO TABS: 325.0000 mg | ORAL_TABLET | Freq: Every day | ORAL | Status: DC

## 2022-08-02 MED ORDER — ONDANSETRON HCL 4 MG PO TABS: 4.0000 mg | ORAL_TABLET | Freq: Four times a day (QID) | ORAL | Status: DC | PRN

## 2022-08-02 MED ORDER — LORAZEPAM 2 MG/ML IJ SOLN
1.0000 mg | Freq: Once | INTRAMUSCULAR | Status: AC
  Administered 2022-08-02: 1 mg via INTRAVENOUS
  Filled 2022-08-02: qty 1

## 2022-08-02 MED ORDER — SODIUM CHLORIDE 0.9 % IV SOLN
750.0000 mg | Freq: Once | INTRAVENOUS | Status: DC
  Filled 2022-08-02: qty 7.5

## 2022-08-02 MED ORDER — METOPROLOL TARTRATE 25 MG PO TABS: 12.5000 mg | ORAL_TABLET | Freq: Two times a day (BID) | ORAL | Status: DC

## 2022-08-02 MED ORDER — SODIUM CHLORIDE 0.9 % IV SOLN
1.0000 g | INTRAVENOUS | Status: AC
  Administered 2022-08-02 – 2022-08-06 (×5): 1 g via INTRAVENOUS
  Filled 2022-08-02 (×6): qty 10

## 2022-08-02 MED ORDER — BISACODYL 5 MG PO TBEC: 5.0000 mg | DELAYED_RELEASE_TABLET | Freq: Every day | ORAL | Status: DC | PRN

## 2022-08-02 MED ORDER — UMECLIDINIUM-VILANTEROL 62.5-25 MCG/ACT IN AEPB: 1.0000 | INHALATION_SPRAY | Freq: Every day | RESPIRATORY_TRACT | Status: DC

## 2022-08-02 MED ORDER — ACETAMINOPHEN 650 MG RE SUPP: 650.0000 mg | Freq: Four times a day (QID) | RECTAL | Status: DC | PRN

## 2022-08-02 MED ORDER — TAMSULOSIN HCL 0.4 MG PO CAPS: 0.4000 mg | ORAL_CAPSULE | Freq: Every day | ORAL | Status: DC

## 2022-08-02 MED ORDER — ESCITALOPRAM OXALATE 10 MG PO TABS: 10.0000 mg | ORAL_TABLET | Freq: Every day | ORAL | Status: DC

## 2022-08-02 MED ORDER — SODIUM CHLORIDE 0.9% FLUSH: 3.0000 mL | INTRAVENOUS | Status: DC | PRN

## 2022-08-02 MED ORDER — LORAZEPAM 2 MG/ML IJ SOLN: 1.0000 mg | INTRAMUSCULAR | Status: DC | PRN

## 2022-08-02 MED ORDER — LEVALBUTEROL HCL 0.63 MG/3ML IN NEBU
0.6300 mg | INHALATION_SOLUTION | Freq: Three times a day (TID) | RESPIRATORY_TRACT | Status: DC | PRN
  Filled 2022-08-02: qty 3

## 2022-08-02 MED ORDER — THIAMINE HCL 100 MG/ML IJ SOLN
500.0000 mg | INTRAVENOUS | Status: DC
  Administered 2022-08-03 – 2022-08-06 (×4): 500 mg via INTRAVENOUS
  Filled 2022-08-02 (×5): qty 5

## 2022-08-02 MED ORDER — ATORVASTATIN CALCIUM 40 MG PO TABS: 40.0000 mg | ORAL_TABLET | Freq: Every day | ORAL | Status: DC

## 2022-08-02 MED ORDER — DEXTROSE-SODIUM CHLORIDE 5-0.9 % IV SOLN: INTRAVENOUS | Status: DC

## 2022-08-02 MED ORDER — LACTATED RINGERS IV SOLN: INTRAVENOUS | Status: DC

## 2022-08-02 MED ORDER — FUROSEMIDE 10 MG/ML IJ SOLN
20.0000 mg | Freq: Once | INTRAMUSCULAR | Status: AC
  Administered 2022-08-02: 20 mg via INTRAVENOUS
  Filled 2022-08-02: qty 2

## 2022-08-02 MED ORDER — APIXABAN 2.5 MG PO TABS: 2.5000 mg | ORAL_TABLET | Freq: Two times a day (BID) | ORAL | Status: DC

## 2022-08-02 MED ORDER — FUROSEMIDE 10 MG/ML IJ SOLN: 20.0000 mg | Freq: Once | INTRAMUSCULAR | Status: DC

## 2022-08-02 MED ORDER — UMECLIDINIUM-VILANTEROL 62.5-25 MCG/ACT IN AEPB
1.0000 | INHALATION_SPRAY | Freq: Every day | RESPIRATORY_TRACT | Status: DC
  Administered 2022-08-03 – 2022-08-06 (×4): 1 via RESPIRATORY_TRACT
  Filled 2022-08-02: qty 14

## 2022-08-02 MED ORDER — SODIUM CHLORIDE 0.9 % IV SOLN
750.0000 mg | Freq: Once | INTRAVENOUS | Status: AC
  Administered 2022-08-02: 750 mg via INTRAVENOUS
  Filled 2022-08-02: qty 7.5

## 2022-08-02 MED ORDER — METOPROLOL TARTRATE 5 MG/5ML IV SOLN: 5.0000 mg | INTRAVENOUS | Status: DC | PRN

## 2022-08-02 MED ORDER — ONDANSETRON HCL 4 MG/2ML IJ SOLN: 4.0000 mg | Freq: Four times a day (QID) | INTRAMUSCULAR | Status: DC | PRN

## 2022-08-02 MED ORDER — SODIUM CHLORIDE 0.9% FLUSH
3.0000 mL | Freq: Two times a day (BID) | INTRAVENOUS | Status: DC
  Administered 2022-08-02: 3 mL via INTRAVENOUS

## 2022-08-02 MED ORDER — SODIUM CHLORIDE 0.9 % IV SOLN: 250.0000 mL | INTRAVENOUS | Status: DC | PRN

## 2022-08-02 MED ORDER — HYDRALAZINE HCL 20 MG/ML IJ SOLN: 10.0000 mg | Freq: Three times a day (TID) | INTRAMUSCULAR | Status: DC | PRN

## 2022-08-02 MED ORDER — ACETAMINOPHEN 325 MG PO TABS: 650.0000 mg | ORAL_TABLET | Freq: Four times a day (QID) | ORAL | Status: DC | PRN

## 2022-08-02 NOTE — Assessment & Plan Note (Addendum)
Will provide out of facility DNR form at time of dc

## 2022-08-02 NOTE — Assessment & Plan Note (Addendum)
Continue BID Protonix and probiotics

## 2022-08-02 NOTE — Assessment & Plan Note (Addendum)
Continue Toprol XL (changed from short-acting Lopressor on admission)

## 2022-08-02 NOTE — Progress Notes (Signed)
   08/02/22 0247  BiPAP/CPAP/SIPAP  $ Non-Invasive Ventilator  Non-Invasive Vent Set Up;Non-Invasive Vent Initial  $ Face Mask Medium Yes  BiPAP/CPAP/SIPAP Pt Type Adult  BiPAP/CPAP/SIPAP Resmed  Mask Type Full face mask  Mask Size Medium  Respiratory Rate 19 breaths/min  IPAP 12 cmH20  EPAP 6 cmH2O  Flow Rate 3 lpm  Patient Home Equipment No  Auto Titrate No  BiPAP/CPAP /SiPAP Vitals  Pulse Rate 87  Resp 20  SpO2 99 %  MEWS Score/Color  MEWS Score 0  MEWS Score Color Green   RN admin Ativan approx 30 mins prior to placing patient on BIPAP again and he is tolerating it well. Wife at the bedside. Patient is sleeping VSS. Will cont to monitor.  Nelda Marseille

## 2022-08-02 NOTE — H&P (Addendum)
History and Physical    Gerald Valdez:[id number] DOB: [birth year] DOA: 08/01/2022  DOS: the patient was seen and examined on 08/02/2022  PCP: [name], MD   Patient coming from: SNF   Chief Complaint:  Chief Complaint  Patient presents with   Seizures    HPI:  This is a 89 year old man with history significant of bladder cancer on chronic Foley, chronic hypoxic and hypercarbic respiratory failure on 3 to oxygen, COPD, HFpEF EF 40 to 45%, hypertension, hyperlipidemia, chronic anemia, CKD stage 3A and atrial fibrillation on Eliquis 2.5 twice daily presented to emergency department from nursing home with concern for seizure. Patient's wife reported that in the afternoon he was sitting in the wheelchair and suddenly she noticed that he was rising his both hands above his head and shaking for a while.  Nursing home called EMS.  En route to the hospital via EMS reported for seizure episodes in 30-minute.  EMS reported patient was awake and had some tremor.  On initial presentation to ED patient was alert oriented x 3. Per chart review and asking to bedside nurse reported that he is alert and able to follow command.  During my evaluation patient's wife at the bedside.  Patient found to be in deep sleep and not opening his eyes with verbal command.  He is on BiPAP due to acute hypercarbic respiratory failure. Occasionally trying to reach the facemask to take off.  Patient's wife is stated that while he is in nursing home every time they placed the BiPAP he tries to take it off in the sleep.   Per chart review patient has been recently being hospital from 6/10 to 6/18.  During that time he was admitted for acute on chronic respiratory failure with hypoxia and hypercarbia and right lower lobe pneumonia with parapneumonic effusion.  Patient also found acute metabolic encephalopathy due to hypercapnia.  He was also diagnosed with new onset of atrial flutter with 4:!1 AV block with RVR.   Cardiology evaluated patient and started metoprolol and Eliquis.  Patient also developed acute congestive heart failure and echo showed EF 40 to 45%, he was diuresed with Lasix, and managed with fluid and salt restriction.  ED Course:  In the ED on initial presentation patient's vitals found stable.  Patient was alert and able to follow verbal command to bedside nurse.  EKG showed normal sinus rhythm. CMP showed slightly low sodium 134 otherwise unremarkable. CBC showed stable H&H 10.5 and 31, no evidence of leukocytosis WBC 10.2.  Chest x-ray showed stable cardiomediastinal contour.  No evidence of pleural effusion or pneumothorax and no new focal airspace consolidation. CT head no evidence of acute intracranial process. MRI no acute intracranial abnormality and chronic small ischemic vessel disease. CT abdomen pelvis showed suspicion for hemorrhagic cystitis, small right pleural effusion and atelectasis.  Review of Systems:  Review of Systems  Unable to perform ROS: Mental status change    Past Medical History:  Diagnosis Date   Acute duodenal ulcer with bleeding 01/30/2014   Acute post-hemorrhagic anemia 01/30/2014   Anxiety    Aortic atherosclerosis (HCC) 02/10/2020   Arthritis    Basal cell carcinoma    Cancer (HCC)    bladder   Chronic kidney disease    COPD exacerbation (HCC) 02/10/2020   Coronary artery disease    Dyspnea    Dyspnea on exertion 02/10/2020   Emphysema of lung (HCC)    Essential hypertension 02/10/2020   GERD (gastroesophageal reflux disease)  02/10/2020   GI bleed 01/30/2014   Hyperlipidemia 02/10/2020    Past Surgical History:  Procedure Laterality Date   BLADDER SURGERY     CATARACT EXTRACTION W/ INTRAOCULAR LENS IMPLANT     CYSTOSCOPY W/ RETROGRADES Bilateral 11/20/2020   Procedure: CYSTOSCOPY WITH RETROGRADE PYELOGRAM;  Surgeon: Marcine Matar, MD;  Location: [facility];  Service: Urology;  Laterality: Bilateral;   CYSTOSCOPY W/ RETROGRADES  Right 02/12/2021   Procedure: CYSTOSCOPY WITH RETROGRADE PYELOGRAM;  Surgeon: Marcine Matar, MD;  Location: [facility];  Service: Urology;  Laterality: Right;   CYSTOSCOPY W/ RETROGRADES Bilateral 06/25/2021   Procedure: CYSTOSCOPY WITH RETROGRADE PYELOGRAM;  Surgeon: Marcine Matar, MD;  Location: [facility];  Service: Urology;  Laterality: Bilateral;   ESOPHAGOGASTRODUODENOSCOPY N/A 01/30/2014   Procedure: ESOPHAGOGASTRODUODENOSCOPY (EGD);  Surgeon: Louis Meckel, MD;  Location: [facility];  Service: Endoscopy;  Laterality: N/A;   TONSILLECTOMY     TRANSURETHRAL RESECTION OF BLADDER TUMOR N/A 02/12/2021   Procedure: REPEAT TRANSURETHRAL RESECTION OF BLADDER TUMOR (TURBT);  Surgeon: Marcine Matar, MD;  Location: [facility];  Service: Urology;  Laterality: N/A;   TRANSURETHRAL RESECTION OF BLADDER TUMOR WITH MITOMYCIN-C N/A 11/20/2020   Procedure: TRANSURETHRAL RESECTION OF BLADDER TUMOR;  Surgeon: Marcine Matar, MD;  Location: [facility];  Service: Urology;  Laterality: N/A;   TRANSURETHRAL RESECTION OF BLADDER TUMOR WITH MITOMYCIN-C N/A 06/25/2021   Procedure: TRANSURETHRAL RESECTION OF BLADDER TUMOR WITH GEMCITABINE;  Surgeon: Marcine Matar, MD;  Location: [facility];  Service: Urology;  Laterality: N/A;  1 HR   TRANSURETHRAL RESECTION OF BLADDER TUMOR WITH MITOMYCIN-C N/A 12/10/2021   Procedure: TRANSURETHRAL RESECTION OF BLADDER TUMOR WITH GEMCITABINE;  Surgeon: Marcine Matar, MD;  Location: [facility];  Service: Urology;  Laterality: N/A;  1 HR   TRANSURETHRAL RESECTION OF BLADDER TUMOR WITH MITOMYCIN-C N/A 05/27/2022   Procedure: TRANSURETHRAL RESECTION OF BLADDER TUMOR;  Surgeon: Marcine Matar, MD;  Location: [facility];  Service: Urology;  Laterality: N/A;     reports that he quit smoking about 30 years ago. His smoking use included cigarettes. He started smoking about 77 years ago. He has never used smokeless tobacco. He reports that he does not currently use alcohol. He reports  that he does not use drugs.  No Known Allergies  Family History  Problem Relation Age of Onset   Hypertension Mother    Stroke Father     Prior to Admission medications   Medication Sig Start Date End Date Taking? Authorizing Provider  apixaban (ELIQUIS) 2.5 MG TABS tablet Take 1 tablet (2.5 mg total) by mouth 2 (two) times daily. 07/27/22  Yes Lanae Boast, MD  ascorbic acid (VITAMIN C) 500 MG tablet Take 500 mg by mouth daily.   Yes [provider]  atorvastatin (LIPITOR) 40 MG tablet Take 1 tablet (40 mg total) by mouth daily. 07/28/22  Yes Kc, Dayna Barker, MD  escitalopram (LEXAPRO) 10 MG tablet Take 10 mg by mouth daily.   Yes [provider]  feeding supplement (ENSURE ENLIVE / ENSURE PLUS) LIQD Take 237 mLs by mouth 2 (two) times daily between meals. 07/27/22  Yes Lanae Boast, MD  ferrous sulfate 325 (65 FE) MG tablet Take 325 mg by mouth daily with breakfast.   Yes [provider]  loratadine (CLARITIN) 10 MG tablet Take 10 mg by mouth daily.   Yes [provider]  metoprolol tartrate (LOPRESSOR) 25 MG tablet Take 0.5 tablets (12.5 mg total) by mouth 2 (two) times daily. 07/28/22  Yes Darlin Drop,  DO  Multiple Vitamin (MULTIVITAMIN WITH MINERALS) TABS tablet Take 1 tablet by mouth daily with breakfast.   Yes [provider]  polyethylene glycol (MIRALAX / GLYCOLAX) 17 g packet Take 17 g by mouth daily as needed for mild constipation. 07/27/22  Yes Lanae Boast, MD  saccharomyces boulardii (FLORASTOR) 250 MG capsule Take 1 capsule (250 mg total) by mouth 2 (two) times daily. 07/05/21  Yes Leatha Gilding, MD  tamsulosin (FLOMAX) 0.4 MG CAPS capsule Take 1 capsule (0.4 mg total) by mouth daily after supper. 07/27/22  Yes Kc, Dayna Barker, MD  umeclidinium-vilanterol (ANORO ELLIPTA) 62.5-25 MCG/ACT AEPB Inhale 1 puff into the lungs daily. 07/28/22  Yes Lanae Boast, MD  OXYGEN Inhale 3 L into the lungs continuous.    [provider]     Physical  Exam: Vitals:   08/02/22 0247 08/02/22 0445 08/02/22 0500 08/02/22 0623  BP:  130/71 (!) 161/147 (!) 96/57  Pulse: 87 85 (!) 105 86  Resp: 20 18 (!) 28 (!) 21  Temp:    97.7 F (36.5 C)  TempSrc:    Axillary  SpO2: 99% 99% 99% 99%    Physical Exam Constitutional:      Comments: Altered mentation.  Eyes:     Pupils: Pupils are equal, round, and reactive to light.  Cardiovascular:     Rate and Rhythm: Normal rate.  Pulmonary:     Effort: Pulmonary effort is normal.     Breath sounds: No wheezing, rhonchi or rales.  Abdominal:     General: Bowel sounds are normal.  Musculoskeletal:     Cervical back: Neck supple.     Right lower leg: No edema.     Left lower leg: No edema.  Skin:    Capillary Refill: Capillary refill takes less than 2 seconds.  Neurological:     Comments: Altered mentation not opening eyes with verbal command.      Labs on Admission: I have personally reviewed following labs and imaging studies  CBC: Recent Labs  Lab 08/01/22 1354 08/01/22 1403 08/02/22 0129 08/02/22 0502  WBC 10.2  --   --   --   HGB 9.3* 10.5* 9.9* 10.2*  HCT 31.0* 31.0* 29.0* 30.0*  MCV 96.9  --   --   --   PLT 207  --   --   --    Basic Metabolic Panel: Recent Labs  Lab 08/01/22 1354 08/01/22 1403 08/02/22 0129 08/02/22 0502  NA 134* 132* 132* 132*  K 5.0 4.9 4.8 4.9  CL 91*  --   --   --   CO2 31  --   --   --   GLUCOSE 114*  --   --   --   BUN 16  --   --   --   CREATININE 1.12  --   --   --   CALCIUM 8.9  --   --   --    GFR: Estimated Creatinine Clearance: 36.9 mL/min (by C-G formula based on SCr of 1.12 mg/dL). Liver Function Tests: No results for input(s): "AST", "ALT", "ALKPHOS", "BILITOT", "PROT", "ALBUMIN" in the last 168 hours. No results for input(s): "LIPASE", "AMYLASE" in the last 168 hours. No results for input(s): "AMMONIA" in the last 168 hours. Coagulation Profile: No results for input(s): "INR", "PROTIME" in the last 168 hours. Cardiac  Enzymes: No results for input(s): "CKTOTAL", "CKMB", "CKMBINDEX", "TROPONINI", "TROPONINIHS" in the last 168 hours. BNP (last 3 results) Recent Labs  07/19/22 1619  BNP 644.6*   HbA1C: No results for input(s): "HGBA1C" in the last 72 hours. CBG: Recent Labs  Lab 08/01/22 1345  GLUCAP 99   Lipid Profile: No results for input(s): "CHOL", "HDL", "LDLCALC", "TRIG", "CHOLHDL", "LDLDIRECT" in the last 72 hours. Thyroid Function Tests: No results for input(s): "TSH", "T4TOTAL", "FREET4", "T3FREE", "THYROIDAB" in the last 72 hours. Anemia Panel: No results for input(s): "VITAMINB12", "FOLATE", "FERRITIN", "TIBC", "IRON", "RETICCTPCT" in the last 72 hours. Urine analysis:    Component Value Date/Time   COLORURINE RED (A) 08/01/2022 1515   APPEARANCEUR CLOUDY (A) 08/01/2022 1515   LABSPEC  08/01/2022 1515    TEST NOT REPORTED DUE TO COLOR INTERFERENCE OF URINE PIGMENT   PHURINE  08/01/2022 1515    TEST NOT REPORTED DUE TO COLOR INTERFERENCE OF URINE PIGMENT   GLUCOSEU (A) 08/01/2022 1515    TEST NOT REPORTED DUE TO COLOR INTERFERENCE OF URINE PIGMENT   HGBUR (A) 08/01/2022 1515    TEST NOT REPORTED DUE TO COLOR INTERFERENCE OF URINE PIGMENT   BILIRUBINUR (A) 08/01/2022 1515    TEST NOT REPORTED DUE TO COLOR INTERFERENCE OF URINE PIGMENT   KETONESUR (A) 08/01/2022 1515    TEST NOT REPORTED DUE TO COLOR INTERFERENCE OF URINE PIGMENT   PROTEINUR (A) 08/01/2022 1515    TEST NOT REPORTED DUE TO COLOR INTERFERENCE OF URINE PIGMENT   NITRITE (A) 08/01/2022 1515    TEST NOT REPORTED DUE TO COLOR INTERFERENCE OF URINE PIGMENT   LEUKOCYTESUR (A) 08/01/2022 1515    TEST NOT REPORTED DUE TO COLOR INTERFERENCE OF URINE PIGMENT    Radiological Exams on Admission: I have personally reviewed images CT ABDOMEN PELVIS W CONTRAST  Result Date: 08/01/2022 CLINICAL DATA:  Hematuria EXAM: CT ABDOMEN AND PELVIS WITH CONTRAST TECHNIQUE: Multidetector CT imaging of the abdomen and pelvis was  performed using the standard protocol following bolus administration of intravenous contrast. RADIATION DOSE REDUCTION: This exam was performed according to the departmental dose-optimization program which includes automated exposure control, adjustment of the mA and/or kV according to patient size and/or use of iterative reconstruction technique. CONTRAST:  60mL OMNIPAQUE IOHEXOL 350 MG/ML SOLN COMPARISON:  CT abdomen and pelvis 07/01/2021 FINDINGS: Lower chest: Emphysema. Small right pleural effusion and atelectasis. Hepatobiliary: No focal liver abnormality is seen. No gallstones, gallbladder wall thickening, or biliary dilatation. Pancreas: Unremarkable. Spleen: Unremarkable. Adrenals/Urinary Tract: Normal adrenal glands. Bilateral cortical renal scarring. No urinary calculi or hydronephrosis. Foley catheter in the bladder. Locules of gas within the bladder. There is hyperdense fluid within the bladder which may represent blood products. The bladder wall is irregular and thickened with mild adjacent stranding compatible with cystitis. Stomach/Bowel: Normal caliber large and small bowel. Extensive sigmoid diverticulosis without evidence of diverticulitis. Moderate stool ball in the rectum. Stomach is within normal limits. Vascular/Lymphatic: Aortic atherosclerosis. No enlarged abdominal or pelvic lymph nodes. Reproductive: No acute abnormality. Other: No free intraperitoneal fluid or air. Musculoskeletal: Demineralization.  No acute fracture. IMPRESSION: 1. Findings suspicious for hemorrhagic cystitis. 2. Small right pleural effusion and atelectasis. Aortic Atherosclerosis (ICD10-I70.0). Electronically Signed   By: Minerva Fester M.D.   On: 08/01/2022 23:59   MR BRAIN WO CONTRAST  Result Date: 08/01/2022 CLINICAL DATA:  Delirium EXAM: MRI HEAD WITHOUT CONTRAST TECHNIQUE: Multiplanar, multiecho pulse sequences of the brain and surrounding structures were obtained without intravenous contrast. COMPARISON:   None Available. FINDINGS: Brain: No acute infarct, mass effect or extra-axial collection. No acute or chronic hemorrhage. There is confluent hyperintense T2-weighted  signal within the white matter. Generalized volume loss. The midline structures are normal. Vascular: Major flow voids are preserved. Skull and upper cervical spine: Normal calvarium and skull base. Visualized upper cervical spine and soft tissues are normal. Sinuses/Orbits:No paranasal sinus fluid levels or advanced mucosal thickening. Small amount of left mastoid fluid. There are bilateral lens replacements. IMPRESSION: 1. No acute intracranial abnormality. 2. Chronic small vessel ischemia and volume loss. Electronically Signed   By: Deatra Robinson M.D.   On: 08/01/2022 22:06   CT Head Wo Contrast  Result Date: 08/01/2022 CLINICAL DATA:  Altered level of consciousness EXAM: CT HEAD WITHOUT CONTRAST TECHNIQUE: Contiguous axial images were obtained from the base of the skull through the vertex without intravenous contrast. RADIATION DOSE REDUCTION: This exam was performed according to the departmental dose-optimization program which includes automated exposure control, adjustment of the mA and/or kV according to patient size and/or use of iterative reconstruction technique. COMPARISON:  None Available. FINDINGS: Brain: Evaluation is limited by streak artifact. No evidence of acute infarct or hemorrhage. Chronic appearing small vessel ischemic changes are seen throughout the periventricular white matter. The lateral ventricles and midline structures are unremarkable. No acute extra-axial fluid collections. No mass effect. Vascular: No hyperdense vessel or unexpected calcification. Skull: Normal. Negative for fracture or focal lesion. Sinuses/Orbits: No acute finding. Other: None. IMPRESSION: 1. No acute intracranial process. Electronically Signed   By: Sharlet Salina M.D.   On: 08/01/2022 15:23   DG Chest Port 1 View  Result Date:  08/01/2022 CLINICAL DATA:  Weakness EXAM: PORTABLE CHEST 1 VIEW COMPARISON:  07/19/2022 FINDINGS: Stable cardiomediastinal contours. Aortic atherosclerosis. Improved aeration at the right lung base. No new focal airspace consolidation. No pleural effusion or pneumothorax. IMPRESSION: No active disease. Electronically Signed   By: Duanne Guess D.O.   On: 08/01/2022 14:39    EKG: My personal interpretation of EKG shows: Normal sinus rhythm    Assessment/Plan: Principal Problem:   Acute metabolic encephalopathy Active Problems:   Acute hypoxic on chronic hypercapnic respiratory failure (HCC)   Seizure like activity (HCC)   COPD (chronic obstructive pulmonary disease) (HCC)   Acute hemorrhagic cystitis   History of bladder cancer on chronic Foley   Chronic systolic CHF preserved EF 40 to 45% (congestive heart failure) (HCC)   H/O atrial flutter   Essential hypertension   Hyperlipidemia   GERD (gastroesophageal reflux disease)   CKD stage 3a, GFR 45-59 ml/min (HCC)   DNR (do not resuscitate)    Assessment and Plan: No notes have been filed under this hospital service. Service: Hospitalist  Acute metabolic encephalopathy -Acute metabolic encephalopathy could be variable etiology in this case which includes underlying seizure versus acute on chronic hypercarbic respiratory failure versus delirium from UTI. -Head CT negative and head MRI negative for any acute intracranial abnormality. -Obtaining routine EEG to make sure patient does not have any underlying seizure like activity. - In the setting of acute hypercarbic respiratory failure continue BiPAP continue to monitor the pCO2 level. -Treating underlying UTI which can be because of delirium leading to acute encephalopathy. - Continue fall precaution, aspiration and seizure precaution. -Admitted to progressive care unit.   Seizure-like episode -Patient has 1 episode of seizure-like activity at nursing home and en route to EMS for  seizure-like episode.  EMS reported bilateral hand tremor. - Patient likely have first-time seizure. -Curb sided with neurology Dr. Derry Lory, per his advice no indication for long-term antiseizure medication for first-time seizure unless MRI or routine EEG shows  significant abnormality.  Stated that if he back does not come back to his baseline can consider one-time loading dose of Keppra.   -Patient did not came back to his baseline.  It could be postictal phase versus hypercapnia. -Obtaining routine EEG.  If EEG negative no further workup needed. - Giving loading dose of Keppra 750 mg IV once.  Dose adjusted based on current creatinine clearance.  -Fall precaution, seizure and aspiration precaution -Patient should follow-up with outpatient neurology and no driving for 6 months.   Acute on chronic hypercarbic and hypoxic respiratory failure Chronic hypoxic respiratory failure 3 L oxygen Chronic hypercarbic respiratory failure-BiPAP at bedtime Respiratory acidosis -Per chart review patient has been recently admitted for at metabolic encephalopathy in the setting of acute hypercarbic respiratory failure and evaluated by pulmonology recommended BiPAP at bedtime. -ABG showed pH 7.3, elevated pCO2 71, pO2 116, bicarb 37 -As patient is retaining pCO2 emergency medicine physician initiated BiPAP. -Repeat ABG showed pCO2 has not much improved.  Reached out to RT and increasing the BiPAP pressure.   Acute hemorrhagic cystitis History of bladder cancer on chronic Foley -Patient's wife reported that since patient has been started on Eliquis since last hospital admission she noticed blood-tinged urine in the Foley bag. -CT abdomen pelvis showed evidence of hemorrhagic cystitis - UA  red-colored, cloudy appearance.  Unable to read leukocyte esterase, nitrate due to pigmentation. -Even though EUS study was not conclusive for UTI based on CT scan finding and with history of bladder cancer continue  empiric antibiotic IV ceftriaxone 1 g daily. -Patient is afebrile and no evidence of leukocytosis. - Follow-up with urine culture.  If urine culture is negative can discontinue the antibiotic.  CHF with preserved EF 40 to 45% -New diagnosis of CHF during last hospital admission on 07/27/2022.  Patient was diuresed with IV Lasix.  As fluids status improved Lasix was discontinued on discharge and recommendation to fluid and clot restriction diet. - CT abdomen pelvis showed right-sided small pleural effusion - Given Lasix 20 mg IV once.  blood pressure is stable and can tolerate. - Once patient is more awake and alert can resume oral medications.   History of atrial flutter -Patient has new diagnosis of new onset of atrial flutter with 4:1 AV block during last hospital admission 07/23/2022.  During that time cardiology evaluated patient and started metoprolol started 12.5 mg twice daily and Eliquis 2.5 mg twice daily. - Initial EKG did not showed normal sinus rhythm.  Given patient is currently n.p.o. due to altered mentation and in the setting of hemorrhagic cystitis holding oral anticoagulation. -Continue telemonitoring. -Continue as needed Lopressor to 5 mg over 2 minutes for heart rate more than 120; repeat dose every 5 minutes as needed; maximum total dose: 15 mg .  Patient will reach out to MD first after the first dose and and if need recurrent dose of metoprolol. -Once patient is more alert and oriented and comes back to baseline can restart oral medication.   History of CKD stage 3a -No evidence of AKI on this hospital admission. -Creatinine 1.2 - Baseline GFR in between 35-55 over the course of last 1 year. - Continue to monitor urine output. - Avoid any nephrotoxic agents when possible.  History of COPD Chronic hypoxic respiratory failure 3 L oxygen - Currently on BiPAP -Continue Anoro Ellipta 1 times daily and Xopenex every 8 as needed for wheezing or shortness of  breath.  History of hypertension - Patient is on metoprolol started 12.5  mg twice daily. -Oral medication on hold as patient is on BiPAP. - Continue hydralazine 10 mg every 6 as needed for systolic blood pressure more than 180   DNR -Advance care planning reviewed and found that that patient has a DNR documentation.   DVT prophylaxis: SCDs Code Status: DNR/DNI(Do NOT Intubate) Diet: N.p.o. currently as on BiPAP Family Communication: Wife at bedside Disposition Plan: Follow-up with improvement of mentation.  If routine EEG show evidence of seizure patient's mentation does not improve please reach out to neurology for continuous EEG and recommendation. Consults: Respiratory therapist Admission status: Inpatient, Step Down Unit   Tereasa Coop, MD [facility] 08/02/2022, 6:42 AM    If 7PM-7AM, please contact night-coverage [email or web address] Password [email or web address]

## 2022-08-02 NOTE — Assessment & Plan Note (Addendum)
-  Appears to be stable at this time -Attempt to avoid nephrotoxic medications 

## 2022-08-02 NOTE — Progress Notes (Signed)
   08/02/22 0500  BiPAP/CPAP/SIPAP  BiPAP/CPAP/SIPAP Pt Type Adult  BiPAP/CPAP/SIPAP Resmed  Mask Type Full face mask  Mask Size Medium  Respiratory Rate 20 breaths/min  IPAP 16 cmH20  EPAP 6 cmH2O  Flow Rate 3 lpm  Patient Home Equipment No  Auto Titrate No  BiPAP/CPAP /SiPAP Vitals  Pulse Rate (!) 105  Resp (!) 28  BP (!) 161/147  SpO2 99 %  MEWS Score/Color  MEWS Score 3  MEWS Score Color Yellow   Increase IPAP due to ABG. Pt tol well. MD aware. Follow up gas ordered at 0800

## 2022-08-02 NOTE — Hospital Course (Addendum)
89 yo male with h/o bladder cancer with chronic foley catheter, chronic hypoxemic and hypercapnic respiratory failure due to COPD, heart failure, hypertension, CKD, and atrial fibrillation who presented with altered mental status. Patient uses a wheelchair for ambulation and is a resident at nursing home.  Recent hospitalization 06/10 to 06/18 for respiratory failure, heart failure, atrial flutter and encephalopathy.  This time apparently he was noted to have shaking movements for 30 minutes while seated in his wheelchair, ? LOC. In the ED he was alert but in respiratory distress and was placed on Bipap. Patient not following commands. Unremarkable VBG/ABG other than hypercapnia 60-70.  Head CT negative. Abdomen and pelvis CT hyperdense fluid within the bladder (possible blood products). Irregular and thickened bladder wall, with mild adjacent stranding compatible with cystitis. Lungs with severe centrilobular emphysema. Small right pleural effusion. CXR negative.  Myoclonic jerks not thought to be from seizures, neurology consulted.  Completing Rocephin for cystitis on 6/28. Cardiology consulted and has signed off.

## 2022-08-02 NOTE — Assessment & Plan Note (Addendum)
Sepsis present on admission, thought to be due to UTI Urine culture was negative Completed antibiotic therapy with ceftriaxone on 6/28 with return to baseline

## 2022-08-02 NOTE — Procedures (Signed)
Patient Name: Gerald Valdez  MRN: [id number]  Epilepsy Attending: Charlsie Quest  Referring Physician/Provider: Tereasa Coop, MD  Date: 08/02/2022 Duration: 24.47 mins  Patient history: 89yo M with 1 episode of seizure-like activity at nursing home and en route to EMS for seizure-like episode. EMS reported bilateral hand tremor. EEG to evaluate for seizure.  Level of alertness: Awake, asleep  AEDs during EEG study: None  Technical aspects: This EEG study was done with scalp electrodes positioned according to the 10-20 International system of electrode placement. Electrical activity was reviewed with band pass filter of 1-70Hz , sensitivity of 7 uV/mm, display speed of 35mm/sec with a 60Hz  notched filter applied as appropriate. EEG data were recorded continuously and digitally stored.  Video monitoring was available and reviewed as appropriate.  Description: No posterior dominant rhythm was seen. Sleep was characterized by vertex waves, sleep spindles (12 to 14 Hz), maximal frontocentral region. EEG showed continuous generalized 3 to 6 Hz theta-delta slowing. Hyperventilation and photic stimulation were not performed.     ABNORMALITY - Continuous slow, generalized  IMPRESSION: This study is suggestive of moderate diffuse encephalopathy, nonspecific etiology. No seizures or epileptiform discharges were seen throughout the recording.  Zaivion Kundrat Annabelle Harman

## 2022-08-02 NOTE — Assessment & Plan Note (Addendum)
Persistent atrial flutter on telemetry Considering acute hematuria and bladder cancer, plan is to hold on anticoagulation and ASA for now Acute blood loss anemia, follow up hgb is 9 and stable

## 2022-08-02 NOTE — Assessment & Plan Note (Addendum)
Mild dementia at baseline, thought to be related to superimposed delirium associated with UTI Brain MRI with no acute changes Given IV fluids and IV thiamine Poor oral intake due to encephalopathy  EEG with diffuse encephalopathy Encephalopathy has returned to baseline with treatment of UTI

## 2022-08-02 NOTE — ED Provider Notes (Signed)
1:14 AM Assumed care from Dr. Wallace Cullens, please see their note for full history, physical and decision making until this point. In brief this is a 89 y.o. year old male who presented to the ED tonight with Seizures     89 yo here after first time seizure. Still not back to baseilne. Neuro, Dr. Kirtland Bouchard, had been curbsided and thought obs overnight was appropriate but as it was still first time seizure, no AED, especially with negative tests.  Also with h/o bladder cancer and possible UTI. Rocephin given. Culture pending.  Pending call back from [facility] for admission. Page placed at [phone].  Discussed with Dr. Janalyn Shy at [phone] for admission. Told it would be at least 3 hours before she could see the patient and put in a bed request. Asked me to order ativan prn and fluids.   Patients co2 was higher than earlier. It appears that he was previously recommended BiPap by pulm. Ordered same and informed hospitalist.   Patient doesn't like the mask as it's uncomfortable. Ativan given as last time he was here he had CO2's>100 and was severely altered and I don't wan thim to get to that point again.   Bed request placed at 0424.   Labs, studies and imaging reviewed by myself and considered in medical decision making if ordered. Imaging interpreted by radiology.  Labs Reviewed  CBC - Abnormal; Notable for the following components:      Result Value   RBC 3.20 (*)    Hemoglobin 9.3 (*)    HCT 31.0 (*)    All other components within normal limits  BASIC METABOLIC PANEL - Abnormal; Notable for the following components:   Sodium 134 (*)    Chloride 91 (*)    Glucose, Bld 114 (*)    All other components within normal limits  URINALYSIS, ROUTINE W REFLEX MICROSCOPIC - Abnormal; Notable for the following components:   Color, Urine RED (*)    APPearance CLOUDY (*)    Glucose, UA   (*)    Value: TEST NOT REPORTED DUE TO COLOR INTERFERENCE OF URINE PIGMENT   Hgb urine dipstick   (*)    Value: TEST NOT REPORTED DUE TO  COLOR INTERFERENCE OF URINE PIGMENT   Bilirubin Urine   (*)    Value: TEST NOT REPORTED DUE TO COLOR INTERFERENCE OF URINE PIGMENT   Ketones, ur   (*)    Value: TEST NOT REPORTED DUE TO COLOR INTERFERENCE OF URINE PIGMENT   Protein, ur   (*)    Value: TEST NOT REPORTED DUE TO COLOR INTERFERENCE OF URINE PIGMENT   Nitrite   (*)    Value: TEST NOT REPORTED DUE TO COLOR INTERFERENCE OF URINE PIGMENT   Leukocytes,Ua   (*)    Value: TEST NOT REPORTED DUE TO COLOR INTERFERENCE OF URINE PIGMENT   All other components within normal limits  URINALYSIS, MICROSCOPIC (REFLEX) - Abnormal; Notable for the following components:   Bacteria, UA RARE (*)    All other components within normal limits  I-STAT VENOUS BLOOD GAS, ED - Abnormal; Notable for the following components:   pCO2, Ven 62.0 (*)    Bicarbonate 35.8 (*)    TCO2 38 (*)    Acid-Base Excess 9.0 (*)    Sodium 132 (*)    Calcium, Ion 1.13 (*)    HCT 31.0 (*)    Hemoglobin 10.5 (*)    All other components within normal limits  URINE CULTURE  BLOOD GAS, ARTERIAL  CBG MONITORING, ED    CT ABDOMEN PELVIS W CONTRAST  Final Result    MR BRAIN WO CONTRAST  Final Result    CT Head Wo Contrast  Final Result    DG Chest Port 1 View  Final Result      No follow-ups on file.    Harryette Shuart, Barbara Cower, MD 08/02/22 [phone]

## 2022-08-02 NOTE — Progress Notes (Signed)
Patient continue to be poorly responsive, he has full face mask bipap in place. His wife and children are at the bedside.   BP 117/68   Pulse 69   Temp 97.7 F (36.5 C) (Axillary)   Resp 17   SpO2 100%   Ill looking appearing Not responsive to voice or light touch, Lungs with no wheezing or rales Heart with S1 and S2 present, irregular, no gallops Abdomen not distended No lower extremity edema.   Follow up VBG 7.33/ 59/ 119/ 34/ 98%  Patient continue with severe acute encephalopathy, complicated with respiratory failure.  Very poor prognosis in the setting of sepsis, urinary tract infection and bladder cancer.   I spoke with patient's children at the bedside, we talked in detail about patient's condition and poor prognosis.  Plan to continue current medical therapy, including IV antibiotic and non invasive mechanical ventilation. No further escalation of care. Main focus will be to avoid suffering and keep patient comfortable.  If worsening condition, no further toleration of Bipap, will transition to full comfort care.  Patient is DNR.

## 2022-08-02 NOTE — Assessment & Plan Note (Addendum)
-   Appears compensated at this time ?

## 2022-08-02 NOTE — Progress Notes (Addendum)
Progress Note   Patient: Gerald Valdez:[id number] DOB: [birth year] DOA: 08/01/2022     0 DOS: the patient was seen and examined on 08/02/2022   Brief hospital course: Mr. Weltz was admitted to the hospital with the working diagnosis of acute metabolic encephalopathy.   89 yo male with the past medical history of bladder cancer, chronic foley catheter, chronic hypoxemic and hypercapnic respiratory failure due to COPD, heart failure, hypertension, CKD, and atrial fibrillation who presented with altered mental status. Patient uses a wheelchair for ambulation and is a resident at nursing home. Recent hospitalization 06/10 to 06/18 for respiratory failure, heart failure, atrial flutter and encephalopathy.  This time apparently he was noted to have shaking movements for 30 minutes, while seating in his wheelchair, not clear if he had loss of consciousness. At the time he reached the ED he was alert but in respiratory distress, he was placed on Bipap.  Blood pressure 130/71, HR 105. RR 28 and 02 saturation 98%, lungs with no wheezing, heart with S1 and S2 present and rhythmic, abdomen with no distention and no lower extremity edema.  Patient not following commands.   VBG 7.36/ 62.0/ 39/ 38/ 70%  ABG 7.32/ 71/ 116/ 40/ 98% ABG 7.32/ 69.9/ 39/ 93%  Na 134, 5.0 Cl 91, bicarbonate 31, glucose 114, bun 16 cr 1,12 Wbc 10,2 hgb 9,3 plt 207 Urine analysis bloody, > 50 rbc, 11-20 wbc.   Head CT with no acute changes. Abdomen and pelvis CT hyperdense fluid within the bladder (possible blood products). Irregular and thickened bladder wall, with mild adjacent stranding compatible with cystitis.  Based of the lungs with severe centrilobular emphysema. Small right pleural effusion.   Chest radiograph with hyperinflation with small right pleural effusions with no infiltrates.      Assessment and Plan: * Acute hypoxic on chronic hypercapnic respiratory failure (HCC) COPD.  Patient this morning  continue on Bipap, he has received lorazepam (1 mg) in the ED in order to tolerate Non invasive mechanical ventilation.  He continue sedated.   Follow up VBG 7.39/ 59/ 119/ 34/ 98%  Plan to continue bipap for now.  Avoid benzodiazepines. Continue oxymetry monitoring.  Currently with acute encephalopathy with delirium, poor prognosis.    Acute metabolic encephalopathy Positive delirium.   Brain MRI with no acute changes. Plan to continue neuro checks and aspiration precautions.  Discontinue lorazepam and add haloperidol as needed. IV fluids and IV thiamine. Poor oral intake due to encephalopathy.  Hold on keppra for now, follow up with EEG.   Acute hemorrhagic cystitis Sepsis present on admission.  Continue broad IV antibiotic therapy with ceftriaxone.   History of bladder cancer on chronic Foley Patient had foley cathter replaced at the nursing home.  He has been referred to Oncology for treatment.    Chronic systolic CHF preserved EF 40 to 45% (congestive heart failure) (HCC) Currently with no signs of exacerbation.  Continue blood pressure monitoring. IV fluids with caution.  H/O atrial flutter Persistent atrial flutter on telemetry. Considering acute hematuria and bladder cancer, plan is to hold on anticoagulation for now.  Acute blood loss anemia, follow up hgb is 8,8.  Close follow up on cell count.    Essential hypertension Continue close blood pressure monitoring.  Hyperlipidemia Continue statin therapy.  CKD stage 3a, GFR 45-59 ml/min (HCC) Hyponatremia.  Renal function stable, patient not able to tolerate po. Will add isotonic saline and follow up renal function in am. Avoid hypotension and nephrotoxic medications.  GERD (gastroesophageal reflux disease) Continue antiacid therapy.  DNR (do not resuscitate) Patient with poor prognosis.         Subjective: Patient is sedated, not opening his eyes or following commands to voice or touch. Has  full face mask in place.   Physical Exam: Vitals:   08/02/22 0623 08/02/22 0700 08/02/22 0727 08/02/22 0802  BP: (!) 96/57 (!) 108/58 112/67 115/72  Pulse: 86 85 84 63  Resp: (!) 21 14 15 20   Temp: 97.7 F (36.5 C)     TempSrc: Axillary     SpO2: 99% 99% 99% 100%   Neurology awake and alert ENT With mild pallor Cardiovascular with S1 and S2 present, regular with no gallops, rubs or murmurs No JVD No lower extremity edema Respiratory with prolonged expiratory phase and decreased breath sounds bilaterally with no wheezing or rhonchi Abdomen with no distention   Data Reviewed:    Family Communication: I spoke with patient's wife at the bedside, we talked in detail about patient's condition, plan of care and prognosis and all questions were addressed.   Disposition: Status is: Inpatient Remains inpatient appropriate because: respiratory failure  Planned Discharge Destination: Skilled nursing facility     Author: Coralie Keens, MD 08/02/2022 10:40 AM  For on call review [email or web address].

## 2022-08-02 NOTE — Assessment & Plan Note (Addendum)
Patient had foley cathter replaced at the nursing home He has been referred to Oncology for treatment as well as to urology (appt scheduled for August) Family requested voiding trial prior to dc Voiding trial unsuccessful; he will go home with a foley and may need to consider prophylactic antibiotics if he has recurrent UTIs Continue tamsulosin

## 2022-08-02 NOTE — Progress Notes (Signed)
RT assisted with transport of this pt from ED to 5W04 while on 3L [place]. RT placed pt back on BiPAP (resmed) Pt tolerating well at this time.

## 2022-08-02 NOTE — Progress Notes (Signed)
EEG complete - results pending 

## 2022-08-02 NOTE — Assessment & Plan Note (Addendum)
Known h/o COPD on 3L home O2 Acute respiratory failure on presentation, CO2 60-70 on blood gases Started on BIPAP, and this has been weaned off and back to home O2  Avoid benzodiazepines Though to be related to acute encephalopathy with delirium, now returned to baseline and no longer requiring additional O2 supplementation compared to baseline Continue Anoro

## 2022-08-02 NOTE — ED Notes (Signed)
ED TO INPATIENT HANDOFF REPORT  ED Nurse Name and Phone #: Moncerrat Burnstein [phone]  S Name/Age/Gender Gerald Valdez 89 y.o. male Room/Bed: 001C/001C  Code Status   Code Status: DNR  Home/SNF/Other Nursing Home  Is this baseline? No   Triage Complete: Triage complete  Chief Complaint Acute metabolic encephalopathy [G93.41]  Triage Note Patient arrived by [facility] from [facility] for reported seizures x 4 in 30 minutes with no hx of same. Patient able to answer questions and arrived alert and looking around. DNR Noted right arm tremor Patient had existing foley for bladder cancer and blood noted in same and on thinners CBG   138   Allergies No Known Allergies  Level of Care/Admitting Diagnosis ED Disposition     ED Disposition  Admit   Condition  --   Comment  Hospital Area: [facility] [[id number]]  Level of Care: Progressive [102]  Admit to Progressive based on following criteria: RESPIRATORY PROBLEMS hypoxemic/hypercapnic respiratory failure that is responsive to NIPPV (BiPAP) or High Flow Nasal Cannula (6-80 lpm). Frequent assessment/intervention, no > Q2 hrs < Q4 hrs, to maintain oxygenation and pulmonary hygiene.  Admit to Progressive based on following criteria: NEUROLOGICAL AND NEUROSURGICAL complex patients with significant risk of instability, who do not meet ICU criteria, yet require close observation or frequent assessment (< / = every 2 - 4 hours) with medical / nursing intervention.  May admit patient to [facility] or [facility] if equivalent level of care is available:: No  Covid Evaluation: Asymptomatic - no recent exposure (last 10 days) testing not required  Diagnosis: Acute metabolic encephalopathy [[id number]]  Admitting Physician: Tereasa Coop [[id number]]  Attending Physician: Tereasa Coop [[id number]]  Certification:: I certify this patient will need inpatient services for at least 2 midnights  Estimated Length of Stay: 5           B Medical/Surgery History Past Medical History:  Diagnosis Date   Acute duodenal ulcer with bleeding 01/30/2014   Acute post-hemorrhagic anemia 01/30/2014   Anxiety    Aortic atherosclerosis (HCC) 02/10/2020   Arthritis    Basal cell carcinoma    Cancer (HCC)    bladder   Chronic kidney disease    COPD exacerbation (HCC) 02/10/2020   Coronary artery disease    Dyspnea    Dyspnea on exertion 02/10/2020   Emphysema of lung (HCC)    Essential hypertension 02/10/2020   GERD (gastroesophageal reflux disease) 02/10/2020   GI bleed 01/30/2014   Hyperlipidemia 02/10/2020   Past Surgical History:  Procedure Laterality Date   BLADDER SURGERY     CATARACT EXTRACTION W/ INTRAOCULAR LENS IMPLANT     CYSTOSCOPY W/ RETROGRADES Bilateral 11/20/2020   Procedure: CYSTOSCOPY WITH RETROGRADE PYELOGRAM;  Surgeon: Marcine Matar, MD;  Location: [facility];  Service: Urology;  Laterality: Bilateral;   CYSTOSCOPY W/ RETROGRADES Right 02/12/2021   Procedure: CYSTOSCOPY WITH RETROGRADE PYELOGRAM;  Surgeon: Marcine Matar, MD;  Location: [facility];  Service: Urology;  Laterality: Right;   CYSTOSCOPY W/ RETROGRADES Bilateral 06/25/2021   Procedure: CYSTOSCOPY WITH RETROGRADE PYELOGRAM;  Surgeon: Marcine Matar, MD;  Location: [facility];  Service: Urology;  Laterality: Bilateral;   ESOPHAGOGASTRODUODENOSCOPY N/A 01/30/2014   Procedure: ESOPHAGOGASTRODUODENOSCOPY (EGD);  Surgeon: Louis Meckel, MD;  Location: [facility];  Service: Endoscopy;  Laterality: N/A;   TONSILLECTOMY     TRANSURETHRAL RESECTION OF BLADDER TUMOR N/A 02/12/2021   Procedure: REPEAT TRANSURETHRAL RESECTION OF BLADDER TUMOR (TURBT);  Surgeon: Marcine Matar, MD;  Location: [facility];  Service: Urology;  Laterality: N/A;   TRANSURETHRAL RESECTION OF BLADDER TUMOR WITH MITOMYCIN-C N/A 11/20/2020   Procedure: TRANSURETHRAL RESECTION OF BLADDER TUMOR;  Surgeon: Marcine Matar, MD;  Location: [facility];  Service: Urology;   Laterality: N/A;   TRANSURETHRAL RESECTION OF BLADDER TUMOR WITH MITOMYCIN-C N/A 06/25/2021   Procedure: TRANSURETHRAL RESECTION OF BLADDER TUMOR WITH GEMCITABINE;  Surgeon: Marcine Matar, MD;  Location: [facility];  Service: Urology;  Laterality: N/A;  1 HR   TRANSURETHRAL RESECTION OF BLADDER TUMOR WITH MITOMYCIN-C N/A 12/10/2021   Procedure: TRANSURETHRAL RESECTION OF BLADDER TUMOR WITH GEMCITABINE;  Surgeon: Marcine Matar, MD;  Location: [facility];  Service: Urology;  Laterality: N/A;  1 HR   TRANSURETHRAL RESECTION OF BLADDER TUMOR WITH MITOMYCIN-C N/A 05/27/2022   Procedure: TRANSURETHRAL RESECTION OF BLADDER TUMOR;  Surgeon: Marcine Matar, MD;  Location: [facility];  Service: Urology;  Laterality: N/A;     A IV Location/Drains/Wounds Patient Lines/Drains/Airways Status     Active Line/Drains/Airways     Name Placement date Placement time Site Days   Peripheral IV 07/22/22 20 G 1" Posterior;Right Forearm 07/22/22  1704  Forearm  11   Peripheral IV 07/23/22 22 G 1" Posterior;Right Forearm 07/23/22  1000  Forearm  10   Peripheral IV 08/01/22 Anterior;Left Forearm 08/01/22  1310  Forearm  1   External Urinary Catheter 07/23/22  0900  --  10            Intake/Output Last 24 hours  Intake/Output Summary (Last 24 hours) at 08/02/2022 1702 Last data filed at 08/02/2022 3086 Gross per 24 hour  Intake 463.38 ml  Output 1500 ml  Net -1036.62 ml    Labs/Imaging Results for orders placed or performed during the hospital encounter of 08/01/22 (from the past 48 hour(s))  CBG monitoring, ED     Status: None   Collection Time: 08/01/22  1:45 PM  Result Value Ref Range   Glucose-Capillary 99 70 - 99 mg/dL    Comment: Glucose reference range applies only to samples taken after fasting for at least 8 hours.  CBC     Status: Abnormal   Collection Time: 08/01/22  1:54 PM  Result Value Ref Range   WBC 10.2 4.0 - 10.5 K/uL   RBC 3.20 (L) 4.22 - 5.81 MIL/uL   Hemoglobin 9.3 (L) 13.0 -  17.0 g/dL   HCT 57.8 (L) 46.9 - 62.9 %   MCV 96.9 80.0 - 100.0 fL   MCH 29.1 26.0 - 34.0 pg   MCHC 30.0 30.0 - 36.0 g/dL   RDW 52.8 41.3 - 24.4 %   Platelets 207 150 - 400 K/uL   nRBC 0.0 0.0 - 0.2 %    Comment: Performed at [facility], [address], [place], [place] [address]  Basic metabolic panel     Status: Abnormal   Collection Time: 08/01/22  1:54 PM  Result Value Ref Range   Sodium 134 (L) 135 - 145 mmol/L   Potassium 5.0 3.5 - 5.1 mmol/L   Chloride 91 (L) 98 - 111 mmol/L   CO2 31 22 - 32 mmol/L   Glucose, Bld 114 (H) 70 - 99 mg/dL    Comment: Glucose reference range applies only to samples taken after fasting for at least 8 hours.   BUN 16 8 - 23 mg/dL   Creatinine, Ser 2.53 0.61 - 1.24 mg/dL   Calcium 8.9 8.9 - 66.4 mg/dL   GFR, Estimated >40 >34 mL/min    Comment: (  NOTE) Calculated using the CKD-EPI Creatinine Equation (2021)    Anion gap 12 5 - 15    Comment: Performed at [facility], [address], [place], [place] [address]  I-Stat venous blood gas, ED     Status: Abnormal   Collection Time: 08/01/22  2:03 PM  Result Value Ref Range   pH, Ven 7.369 7.25 - 7.43   pCO2, Ven 62.0 (H) 44 - 60 mmHg   pO2, Ven 39 32 - 45 mmHg   Bicarbonate 35.8 (H) 20.0 - 28.0 mmol/L   TCO2 38 (H) 22 - 32 mmol/L   O2 Saturation 70 %   Acid-Base Excess 9.0 (H) 0.0 - 2.0 mmol/L   Sodium 132 (L) 135 - 145 mmol/L   Potassium 4.9 3.5 - 5.1 mmol/L   Calcium, Ion 1.13 (L) 1.15 - 1.40 mmol/L   HCT 31.0 (L) 39.0 - 52.0 %   Hemoglobin 10.5 (L) 13.0 - 17.0 g/dL   Sample type VENOUS    Comment NOTIFIED PHYSICIAN   Urinalysis, Routine w reflex microscopic -Urine, Catheterized     Status: Abnormal   Collection Time: 08/01/22  3:15 PM  Result Value Ref Range   Color, Urine RED (A) YELLOW    Comment: MICROSCOPIC EXAM PERFORMED ON UNCONCENTRATED URINE LESS THAN 10 mL OF URINE SUBMITTED BIOCHEMICALS MAY BE AFFECTED BY COLOR    APPearance CLOUDY (A) CLEAR   Specific Gravity,  Urine  1.005 - 1.030    TEST NOT REPORTED DUE TO COLOR INTERFERENCE OF URINE PIGMENT   pH  5.0 - 8.0    TEST NOT REPORTED DUE TO COLOR INTERFERENCE OF URINE PIGMENT   Glucose, UA (A) NEGATIVE mg/dL    TEST NOT REPORTED DUE TO COLOR INTERFERENCE OF URINE PIGMENT   Hgb urine dipstick (A) NEGATIVE    TEST NOT REPORTED DUE TO COLOR INTERFERENCE OF URINE PIGMENT   Bilirubin Urine (A) NEGATIVE    TEST NOT REPORTED DUE TO COLOR INTERFERENCE OF URINE PIGMENT   Ketones, ur (A) NEGATIVE mg/dL    TEST NOT REPORTED DUE TO COLOR INTERFERENCE OF URINE PIGMENT   Protein, ur (A) NEGATIVE mg/dL    TEST NOT REPORTED DUE TO COLOR INTERFERENCE OF URINE PIGMENT   Nitrite (A) NEGATIVE    TEST NOT REPORTED DUE TO COLOR INTERFERENCE OF URINE PIGMENT   Leukocytes,Ua (A) NEGATIVE    TEST NOT REPORTED DUE TO COLOR INTERFERENCE OF URINE PIGMENT    Comment: Performed at [facility], [address] [place], [place] [address]  Urinalysis, Microscopic (reflex)     Status: Abnormal   Collection Time: 08/01/22  3:15 PM  Result Value Ref Range   RBC / HPF >50 0 - 5 RBC/hpf   WBC, UA 11-20 0 - 5 WBC/hpf   Bacteria, UA RARE (A) NONE SEEN   Squamous Epithelial / HPF 0-5 0 - 5 /HPF    Comment: Performed at [facility], [address], [place], [place] [address]  I-Stat arterial blood gas, ED     Status: Abnormal   Collection Time: 08/02/22  1:29 AM  Result Value Ref Range   pH, Arterial 7.327 (L) 7.35 - 7.45   pCO2 arterial 71.6 (HH) 32 - 48 mmHg   pO2, Arterial 116 (H) 83 - 108 mmHg   Bicarbonate 37.5 (H) 20.0 - 28.0 mmol/L   TCO2 40 (H) 22 - 32 mmol/L   O2 Saturation 98 %   Acid-Base Excess 10.0 (H) 0.0 -  2.0 mmol/L   Sodium 132 (L) 135 - 145 mmol/L   Potassium 4.8 3.5 - 5.1 mmol/L   Calcium, Ion 1.23 1.15 - 1.40 mmol/L   HCT 29.0 (L) 39.0 - 52.0 %   Hemoglobin 9.9 (L) 13.0 - 17.0 g/dL   Patient temperature 16.1 F    Collection site RADIAL, ALLEN'S TEST ACCEPTABLE    Drawn by Operator     Sample type ARTERIAL    Comment NOTIFIED PHYSICIAN   I-Stat arterial blood gas, ED     Status: Abnormal   Collection Time: 08/02/22  5:02 AM  Result Value Ref Range   pH, Arterial 7.328 (L) 7.35 - 7.45   pCO2 arterial 69.6 (HH) 32 - 48 mmHg   pO2, Arterial 73 (L) 83 - 108 mmHg   Bicarbonate 36.5 (H) 20.0 - 28.0 mmol/L   TCO2 39 (H) 22 - 32 mmol/L   O2 Saturation 93 %   Acid-Base Excess 9.0 (H) 0.0 - 2.0 mmol/L   Sodium 132 (L) 135 - 145 mmol/L   Potassium 4.9 3.5 - 5.1 mmol/L   Calcium, Ion 1.26 1.15 - 1.40 mmol/L   HCT 30.0 (L) 39.0 - 52.0 %   Hemoglobin 10.2 (L) 13.0 - 17.0 g/dL   Patient temperature 09.6 F    Collection site RADIAL, ALLEN'S TEST ACCEPTABLE    Drawn by Operator    Sample type ARTERIAL    Comment NOTIFIED PHYSICIAN   Comprehensive metabolic panel     Status: Abnormal   Collection Time: 08/02/22  5:55 AM  Result Value Ref Range   Sodium 131 (L) 135 - 145 mmol/L   Potassium 5.1 3.5 - 5.1 mmol/L   Chloride 93 (L) 98 - 111 mmol/L   CO2 29 22 - 32 mmol/L   Glucose, Bld 73 70 - 99 mg/dL    Comment: Glucose reference range applies only to samples taken after fasting for at least 8 hours.   BUN 15 8 - 23 mg/dL   Creatinine, Ser 0.45 0.61 - 1.24 mg/dL   Calcium 8.5 (L) 8.9 - 10.3 mg/dL   Total Protein 5.2 (L) 6.5 - 8.1 g/dL   Albumin 3.0 (L) 3.5 - 5.0 g/dL   Valdez 33 15 - 41 U/L   ALT 35 0 - 44 U/L   Alkaline Phosphatase 32 (L) 38 - 126 U/L   Total Bilirubin 0.6 0.3 - 1.2 mg/dL   GFR, Estimated >40 >98 mL/min    Comment: (NOTE) Calculated using the CKD-EPI Creatinine Equation (2021)    Anion gap 9 5 - 15    Comment: Performed at [facility], [address] [place], [place] [address]  I-Stat venous blood gas, ED     Status: Abnormal   Collection Time: 08/02/22  9:33 AM  Result Value Ref Range   pH, Ven 7.339 7.25 - 7.43   pCO2, Ven 59.2 44 - 60 mmHg   pO2, Ven 119 (H) 32 - 45 mmHg   Bicarbonate 31.9 (H) 20.0 - 28.0 mmol/L   TCO2 34 (H) 22 - 32 mmol/L    O2 Saturation 98 %   Acid-Base Excess 5.0 (H) 0.0 - 2.0 mmol/L   Sodium 136 135 - 145 mmol/L   Potassium 4.1 3.5 - 5.1 mmol/L   Calcium, Ion 1.03 (L) 1.15 - 1.40 mmol/L   HCT 26.0 (L) 39.0 - 52.0 %   Hemoglobin 8.8 (L) 13.0 - 17.0 g/dL   Sample type VENOUS    EEG adult  Result  Date: 08/02/2022 Charlsie Quest, MD     08/02/2022  1:51 PM Patient Name: CHEICK SUHR MRN: [id number] Epilepsy Attending: Charlsie Quest Referring Physician/Provider: Tereasa Coop, MD Date: 08/02/2022 Duration: 24.47 mins Patient history: 89yo M with 1 episode of seizure-like activity at nursing home and en route to EMS for seizure-like episode. EMS reported bilateral hand tremor. EEG to evaluate for seizure. Level of alertness: Awake, asleep AEDs during EEG study: None Technical aspects: This EEG study was done with scalp electrodes positioned according to the 10-20 International system of electrode placement. Electrical activity was reviewed with band pass filter of 1-70Hz , sensitivity of 7 uV/mm, display speed of 63mm/sec with a 60Hz  notched filter applied as appropriate. EEG data were recorded continuously and digitally stored.  Video monitoring was available and reviewed as appropriate. Description: No posterior dominant rhythm was seen. Sleep was characterized by vertex waves, sleep spindles (12 to 14 Hz), maximal frontocentral region. EEG showed continuous generalized 3 to 6 Hz theta-delta slowing. Hyperventilation and photic stimulation were not performed.   ABNORMALITY - Continuous slow, generalized IMPRESSION: This study is suggestive of moderate diffuse encephalopathy, nonspecific etiology. No seizures or epileptiform discharges were seen throughout the recording. Charlsie Quest   CT ABDOMEN PELVIS W CONTRAST  Result Date: 08/01/2022 CLINICAL DATA:  Hematuria EXAM: CT ABDOMEN AND PELVIS WITH CONTRAST TECHNIQUE: Multidetector CT imaging of the abdomen and pelvis was performed using the standard protocol  following bolus administration of intravenous contrast. RADIATION DOSE REDUCTION: This exam was performed according to the departmental dose-optimization program which includes automated exposure control, adjustment of the mA and/or kV according to patient size and/or use of iterative reconstruction technique. CONTRAST:  60mL OMNIPAQUE IOHEXOL 350 MG/ML SOLN COMPARISON:  CT abdomen and pelvis 07/01/2021 FINDINGS: Lower chest: Emphysema. Small right pleural effusion and atelectasis. Hepatobiliary: No focal liver abnormality is seen. No gallstones, gallbladder wall thickening, or biliary dilatation. Pancreas: Unremarkable. Spleen: Unremarkable. Adrenals/Urinary Tract: Normal adrenal glands. Bilateral cortical renal scarring. No urinary calculi or hydronephrosis. Foley catheter in the bladder. Locules of gas within the bladder. There is hyperdense fluid within the bladder which may represent blood products. The bladder wall is irregular and thickened with mild adjacent stranding compatible with cystitis. Stomach/Bowel: Normal caliber large and small bowel. Extensive sigmoid diverticulosis without evidence of diverticulitis. Moderate stool ball in the rectum. Stomach is within normal limits. Vascular/Lymphatic: Aortic atherosclerosis. No enlarged abdominal or pelvic lymph nodes. Reproductive: No acute abnormality. Other: No free intraperitoneal fluid or air. Musculoskeletal: Demineralization.  No acute fracture. IMPRESSION: 1. Findings suspicious for hemorrhagic cystitis. 2. Small right pleural effusion and atelectasis. Aortic Atherosclerosis (ICD10-I70.0). Electronically Signed   By: Minerva Fester M.D.   On: 08/01/2022 23:59   MR BRAIN WO CONTRAST  Result Date: 08/01/2022 CLINICAL DATA:  Delirium EXAM: MRI HEAD WITHOUT CONTRAST TECHNIQUE: Multiplanar, multiecho pulse sequences of the brain and surrounding structures were obtained without intravenous contrast. COMPARISON:  None Available. FINDINGS: Brain: No acute  infarct, mass effect or extra-axial collection. No acute or chronic hemorrhage. There is confluent hyperintense T2-weighted signal within the white matter. Generalized volume loss. The midline structures are normal. Vascular: Major flow voids are preserved. Skull and upper cervical spine: Normal calvarium and skull base. Visualized upper cervical spine and soft tissues are normal. Sinuses/Orbits:No paranasal sinus fluid levels or advanced mucosal thickening. Small amount of left mastoid fluid. There are bilateral lens replacements. IMPRESSION: 1. No acute intracranial abnormality. 2. Chronic small vessel ischemia and volume loss. Electronically Signed  By: Deatra Robinson M.D.   On: 08/01/2022 22:06   CT Head Wo Contrast  Result Date: 08/01/2022 CLINICAL DATA:  Altered level of consciousness EXAM: CT HEAD WITHOUT CONTRAST TECHNIQUE: Contiguous axial images were obtained from the base of the skull through the vertex without intravenous contrast. RADIATION DOSE REDUCTION: This exam was performed according to the departmental dose-optimization program which includes automated exposure control, adjustment of the mA and/or kV according to patient size and/or use of iterative reconstruction technique. COMPARISON:  None Available. FINDINGS: Brain: Evaluation is limited by streak artifact. No evidence of acute infarct or hemorrhage. Chronic appearing small vessel ischemic changes are seen throughout the periventricular white matter. The lateral ventricles and midline structures are unremarkable. No acute extra-axial fluid collections. No mass effect. Vascular: No hyperdense vessel or unexpected calcification. Skull: Normal. Negative for fracture or focal lesion. Sinuses/Orbits: No acute finding. Other: None. IMPRESSION: 1. No acute intracranial process. Electronically Signed   By: Sharlet Salina M.D.   On: 08/01/2022 15:23   DG Chest Port 1 View  Result Date: 08/01/2022 CLINICAL DATA:  Weakness EXAM: PORTABLE CHEST 1  VIEW COMPARISON:  07/19/2022 FINDINGS: Stable cardiomediastinal contours. Aortic atherosclerosis. Improved aeration at the right lung base. No new focal airspace consolidation. No pleural effusion or pneumothorax. IMPRESSION: No active disease. Electronically Signed   By: Duanne Guess D.O.   On: 08/01/2022 14:39    Pending Labs Unresulted Labs (From admission, onward)     Start     Ordered   08/03/22 0500  Basic metabolic panel  Tomorrow morning,   R        08/02/22 1045   08/03/22 0500  CBC  Tomorrow morning,   R        08/02/22 1045   08/01/22 2010  Urine Culture  Once,   URGENT       Question:  Indication  Answer:  Acute gross hematuria   08/01/22 2009            Vitals/Pain Today's Vitals   08/02/22 1229 08/02/22 1245 08/02/22 1445 08/02/22 1619  BP:  127/67 110/62   Pulse:  87 93   Resp: (!) 21 13 15    Temp:    97.7 F (36.5 C)  TempSrc:    Axillary  SpO2: 100% 100% 99%   PainSc:        Isolation Precautions No active isolations  Medications Medications  sodium chloride flush (NS) 0.9 % injection 3 mL (3 mLs Intravenous Not Given 08/02/22 1228)  sodium chloride flush (NS) 0.9 % injection 3 mL (has no administration in time range)  0.9 %  sodium chloride infusion (has no administration in time range)  ondansetron (ZOFRAN) tablet 4 mg (has no administration in time range)  hydrALAZINE (APRESOLINE) injection 10 mg (has no administration in time range)  cefTRIAXone (ROCEPHIN) 1 g in sodium chloride 0.9 % 100 mL IVPB (0 g Intravenous Stopped 08/02/22 1232)  acetaminophen (TYLENOL) suppository 650 mg (has no administration in time range)  umeclidinium-vilanterol (ANORO ELLIPTA) 62.5-25 MCG/ACT 1 puff (1 puff Inhalation Not Given 08/02/22 0737)  levalbuterol (XOPENEX) nebulizer solution 0.63 mg (has no administration in time range)  metoprolol tartrate (LOPRESSOR) injection 5 mg (has no administration in time range)  dextrose 5 %-0.9 % sodium chloride infusion (  Intravenous New Bag/Given 08/02/22 1233)  thiamine (VITAMIN B1) 500 mg in sodium chloride 0.9 % 50 mL IVPB (0 mg Intravenous Hold 08/02/22 1235)  cefTRIAXone (ROCEPHIN) 1 g in sodium chloride 0.9 %  100 mL IVPB (0 g Intravenous Stopped 08/01/22 2359)  iohexol (OMNIPAQUE) 350 MG/ML injection 60 mL (60 mLs Intravenous Contrast Given 08/01/22 2350)  LORazepam (ATIVAN) injection 1 mg (1 mg Intravenous Given 08/02/22 0214)  levETIRAcetam (KEPPRA) 750 mg in sodium chloride 0.9 % 100 mL IVPB (0 mg Intravenous Stopped 08/02/22 0616)  furosemide (LASIX) injection 20 mg (20 mg Intravenous Given 08/02/22 5188)    Mobility Unknown     Focused Assessments    R Recommendations: See Admitting Provider Note  Report given to:   Additional Notes:

## 2022-08-02 NOTE — Assessment & Plan Note (Addendum)
Discontinue atorvastatin as per cardiology

## 2022-08-02 NOTE — Progress Notes (Signed)
Placed patient on BIPAP (resmed) per ABG and history of home orders for BIPAP. Pt did not tolerate it. He said take this off I can't breathe. After trying to calm him and his wife trying to help he reached up and ripped the mask off. Pt placed back on 3L [place]. MD aware.

## 2022-08-03 ENCOUNTER — Inpatient Hospital Stay (HOSPITAL_COMMUNITY): Payer: Medicare Other

## 2022-08-03 DIAGNOSIS — J9601 Acute respiratory failure with hypoxia: Secondary | ICD-10-CM | POA: Diagnosis not present

## 2022-08-03 DIAGNOSIS — J9612 Chronic respiratory failure with hypercapnia: Secondary | ICD-10-CM | POA: Diagnosis not present

## 2022-08-03 LAB — PROCALCITONIN: Procalcitonin: <0.1 ng/mL

## 2022-08-03 LAB — CBC
HCT: 30.2 % — ABNORMAL LOW (ref 39.0–52.0)
Hemoglobin: 9.2 g/dL — ABNORMAL LOW (ref 13.0–17.0)
MCH: 29.1 pg (ref 26.0–34.0)
MCHC: 30.5 g/dL (ref 30.0–36.0)
MCV: 95.6 fL (ref 80.0–100.0)
Platelets: 194 10*3/uL (ref 150–400)
RBC: 3.16 MIL/uL — ABNORMAL LOW (ref 4.22–5.81)
RDW: 13.2 % (ref 11.5–15.5)
WBC: 8.1 10*3/uL (ref 4.0–10.5)
nRBC: 0 % (ref 0.0–0.2)

## 2022-08-03 LAB — C-REACTIVE PROTEIN: CRP: 0.5 mg/dL (ref <1.0)

## 2022-08-03 LAB — BRAIN NATRIURETIC PEPTIDE: B Natriuretic Peptide: 509.5 pg/mL — ABNORMAL HIGH (ref 0.0–100.0)

## 2022-08-03 LAB — BASIC METABOLIC PANEL
Anion gap: 8 (ref 5–15)
BUN: 13 mg/dL (ref 8–23)
CO2: 35 mmol/L — ABNORMAL HIGH (ref 22–32)
Calcium: 8.5 mg/dL — ABNORMAL LOW (ref 8.9–10.3)
Chloride: 94 mmol/L — ABNORMAL LOW (ref 98–111)
Creatinine, Ser: 1.11 mg/dL (ref 0.61–1.24)
GFR, Estimated: >60 mL/min (ref >60)
Glucose, Bld: 106 mg/dL — ABNORMAL HIGH (ref 70–99)
Potassium: 4.4 mmol/L (ref 3.5–5.1)
Sodium: 137 mmol/L (ref 135–145)

## 2022-08-03 LAB — URINE CULTURE: Culture: NO GROWTH

## 2022-08-03 LAB — TROPONIN I (HIGH SENSITIVITY)
Troponin I (High Sensitivity): 26 ng/L — ABNORMAL HIGH (ref <18)
Troponin I (High Sensitivity): 28 ng/L — ABNORMAL HIGH (ref <18)

## 2022-08-03 LAB — AMMONIA: Ammonia: <10 umol/L (ref 9–35)

## 2022-08-03 LAB — TSH: TSH: 4.079 u[IU]/mL (ref 0.350–4.500)

## 2022-08-03 LAB — MAGNESIUM: Magnesium: 1.7 mg/dL (ref 1.7–2.4)

## 2022-08-03 MED ORDER — DILTIAZEM HCL 25 MG/5ML IV SOLN: 10.0000 mg | Freq: Four times a day (QID) | INTRAVENOUS | Status: DC | PRN

## 2022-08-03 MED ORDER — METOPROLOL TARTRATE 12.5 MG HALF TABLET
12.5000 mg | ORAL_TABLET | Freq: Once | ORAL | Status: DC
  Filled 2022-08-03: qty 1

## 2022-08-03 MED ORDER — ESCITALOPRAM OXALATE 10 MG PO TABS
10.0000 mg | ORAL_TABLET | Freq: Every day | ORAL | Status: DC
  Administered 2022-08-03 – 2022-08-06 (×4): 10 mg via ORAL
  Filled 2022-08-03 (×4): qty 1

## 2022-08-03 MED ORDER — MAGNESIUM SULFATE 2 GM/50ML IV SOLN
2.0000 g | Freq: Once | INTRAVENOUS | Status: AC
  Administered 2022-08-03: 2 g via INTRAVENOUS
  Filled 2022-08-03: qty 50

## 2022-08-03 MED ORDER — APIXABAN 2.5 MG PO TABS
2.5000 mg | ORAL_TABLET | Freq: Two times a day (BID) | ORAL | Status: DC
  Administered 2022-08-03: 2.5 mg via ORAL
  Filled 2022-08-03: qty 1

## 2022-08-03 MED ORDER — FUROSEMIDE 10 MG/ML IJ SOLN
20.0000 mg | Freq: Once | INTRAMUSCULAR | Status: AC
  Administered 2022-08-03: 20 mg via INTRAVENOUS
  Filled 2022-08-03: qty 2

## 2022-08-03 MED ORDER — PANTOPRAZOLE SODIUM 40 MG PO TBEC
40.0000 mg | DELAYED_RELEASE_TABLET | Freq: Two times a day (BID) | ORAL | Status: DC
  Administered 2022-08-03 – 2022-08-06 (×7): 40 mg via ORAL
  Filled 2022-08-03 (×7): qty 1

## 2022-08-03 MED ORDER — METOPROLOL TARTRATE 25 MG PO TABS: 25.0000 mg | ORAL_TABLET | Freq: Two times a day (BID) | ORAL | Status: DC

## 2022-08-03 MED ORDER — DILTIAZEM HCL 60 MG PO TABS
60.0000 mg | ORAL_TABLET | Freq: Three times a day (TID) | ORAL | Status: DC
  Administered 2022-08-03: 60 mg via ORAL
  Filled 2022-08-03: qty 1

## 2022-08-03 MED ORDER — SACCHAROMYCES BOULARDII 250 MG PO CAPS
250.0000 mg | ORAL_CAPSULE | Freq: Two times a day (BID) | ORAL | Status: DC
  Administered 2022-08-03 – 2022-08-06 (×7): 250 mg via ORAL
  Filled 2022-08-03 (×7): qty 1

## 2022-08-03 MED ORDER — ENSURE ENLIVE PO LIQD
237.0000 mL | Freq: Two times a day (BID) | ORAL | Status: DC
  Administered 2022-08-03 – 2022-08-06 (×7): 237 mL via ORAL

## 2022-08-03 MED ORDER — LACTATED RINGERS IV BOLUS
500.0000 mL | Freq: Once | INTRAVENOUS | Status: AC
  Administered 2022-08-03: 500 mL via INTRAVENOUS

## 2022-08-03 MED ORDER — ATORVASTATIN CALCIUM 40 MG PO TABS
40.0000 mg | ORAL_TABLET | Freq: Every day | ORAL | Status: DC
  Administered 2022-08-03: 40 mg via ORAL
  Filled 2022-08-03: qty 1

## 2022-08-03 MED ORDER — DILTIAZEM HCL-DEXTROSE 125-5 MG/125ML-% IV SOLN (PREMIX)
5.0000 mg/h | INTRAVENOUS | Status: DC
  Filled 2022-08-03: qty 125

## 2022-08-03 MED ORDER — TAMSULOSIN HCL 0.4 MG PO CAPS
0.4000 mg | ORAL_CAPSULE | Freq: Every day | ORAL | Status: DC
  Administered 2022-08-03 – 2022-08-06 (×4): 0.4 mg via ORAL
  Filled 2022-08-03 (×5): qty 1

## 2022-08-03 MED ORDER — DEXTROSE-SODIUM CHLORIDE 5-0.9 % IV SOLN: INTRAVENOUS | Status: DC

## 2022-08-03 MED ORDER — METOPROLOL TARTRATE 12.5 MG HALF TABLET
12.5000 mg | ORAL_TABLET | Freq: Two times a day (BID) | ORAL | Status: DC
  Administered 2022-08-03: 12.5 mg via ORAL
  Filled 2022-08-03: qty 1

## 2022-08-03 MED ORDER — DILTIAZEM LOAD VIA INFUSION
10.0000 mg | Freq: Once | INTRAVENOUS | Status: DC
  Filled 2022-08-03: qty 10

## 2022-08-03 MED ORDER — MAGNESIUM SULFATE IN D5W 1-5 GM/100ML-% IV SOLN
1.0000 g | Freq: Once | INTRAVENOUS | Status: AC
  Administered 2022-08-03: 1 g via INTRAVENOUS
  Filled 2022-08-03 (×2): qty 100

## 2022-08-03 MED ORDER — ASPIRIN 81 MG PO CHEW
81.0000 mg | CHEWABLE_TABLET | Freq: Every day | ORAL | Status: DC
  Administered 2022-08-03 – 2022-08-05 (×3): 81 mg via ORAL
  Filled 2022-08-03 (×3): qty 1

## 2022-08-03 MED ORDER — POLYETHYLENE GLYCOL 3350 17 G PO PACK
17.0000 g | PACK | Freq: Every day | ORAL | Status: DC
  Administered 2022-08-03: 17 g via ORAL
  Filled 2022-08-03: qty 1

## 2022-08-03 NOTE — Progress Notes (Signed)
EKG CRITICAL VALUE     12 lead EKG performed.  Critical value noted.  Hessie Diener , RN  and Dr Thedore Mins notified.   Orma Flaming, EKG Technician 08/03/2022 11:01 AM

## 2022-08-03 NOTE — Consult Note (Addendum)
Cardiology Consultation   Patient ID: Gerald Valdez MRN: [id number]; DOB: 10/25/1928  Admit date: 08/01/2022 Date of Consult: 08/03/2022  PCP:  [name], MD   [facility] Providers Cardiologist:  Kristeen Miss, MD      Patient Profile:   Gerald Valdez is a 89 y.o. male with a hx of paroxysmal atrial flutter, coronary calcifications on CT scan, HLD, HTN, COPD, bladder cancer, chronic respiratory failure with hypoxia, CKD stage IIIa who is being seen 08/03/2022 for the evaluation of atrial flutter, abnormal EKG at the request of Dr. Thedore Mins.  History of Present Illness:   Gerald Valdez is a 89 year old male with above medical history who is followed by Dr. Elease Hashimoto.  Per chart review, patient was referred to Dr. Elease Hashimoto in 02/2020 after he had a chest CT that showed three-vessel coronary artery calcifications.  At his appoint with Dr. Elease Hashimoto, patient denied any episodes of angina.  He underwent echocardiogram in 02/11/2020 that showed EF 55-60%, no regional wall motion abnormalities, grade 1 diastolic dysfunction, normal RV systolic function, a 1 cm time 0.8 cm calcified mobile density in the LVOT between the aortic valve and anterior mitral valve leaflet.  Decision was made to hold off on invasive or interventional procedures due to patient's lack of symptoms.  He was managed medically with statin therapy.  He was not treated with aspirin due to history of duodenal ulcers.  Patient was recently admitted from 6/10 - 07/27/2022 after he presented to the ER after his family noticed that he was difficult to arouse.  In the ED, patient found to have severe hypercapnia with pCO2 101.  Chest x-ray showed possible right lower lobe pneumonia with small parapneumonic effusion.  He also had a UTI.  Patient was found to have new onset atrial flutter with RVR and cardiology was consulted.  Patient was started on Eliquis and metoprolol.  He underwent echocardiogram on 07/21/2022 that showed EF  45-50%, no regional wall motion abnormalities, normal RV systolic function, small pericardial effusion, mild-moderate mitral valve regurgitation, LVOT calcification unchanged from prior.  Patient's heart rate was well-controlled on metoprolol 25 mg twice daily.  Considered cardioversion, however given severity of lung disease the risk of recurrent atrial arrhythmia would be high.  During that admission, patient was seen by palliative care and became DNR/DNI.  He was discharged to rehab facility.  While living at the rehab facility, he was noted to have bloody urine and Eliquis has been held.  Patient presented to the ED on 6/23 from his rehab facility.  His wife reported that in the afternoon, he was sitting in his wheelchair and he suddenly rose both his hands above his head and started to shake.  Nursing home called EMS.  EMS reported 4 seizure-like episodes while en route to the hospital. In the ED, patient was alert and oriented and was able to follow commands. Initial vital signs in the ED showed HR 87 BPM, BP 115/81, oxygen 100% on 3L via [place].  EKG showed normal sinus rhythm with HR 86 BPM. CXR showed no active disease. CT abdomen pelvis showed suspicion for hemorrhagic cystitis, small right pleural effusion and atelectasis.   Patient was admitted to the internal medicine service. He was treated with IV antibiotics for acute hemorrhagic cystitis with concerns for sepsis. Eliquis was held. He was also treated with Bi-PAP for acute on chronic hypercarbic and hypoxic respiratory failure. He was able to be titrated off bi-pap on 6/25. On 6/25 around 0930 AM,  patient went into atrial flutter with HR in the 120-130s. Cardiology was consulted.   Per discussion with family at bedside, after he was discharged on 6/18, he went to live at a rehab facility. While visiting him, his wife noticed that he had significant amounts of visibile blood/blood clots in his urine. She also noted that he was acting confused, and  had seizure like activity so they called EMS. Patient is resting in bed comfortably at the time of my evaluation, and he denies chest pain, palpitations, worsening breathing.    Past Medical History:  Diagnosis Date   Acute duodenal ulcer with bleeding 01/30/2014   Acute post-hemorrhagic anemia 01/30/2014   Anxiety    Aortic atherosclerosis (HCC) 02/10/2020   Arthritis    Basal cell carcinoma    Cancer (HCC)    bladder   Chronic kidney disease    COPD exacerbation (HCC) 02/10/2020   Coronary artery disease    Dyspnea    Dyspnea on exertion 02/10/2020   Emphysema of lung (HCC)    Essential hypertension 02/10/2020   GERD (gastroesophageal reflux disease) 02/10/2020   GI bleed 01/30/2014   Hyperlipidemia 02/10/2020    Past Surgical History:  Procedure Laterality Date   BLADDER SURGERY     CATARACT EXTRACTION W/ INTRAOCULAR LENS IMPLANT     CYSTOSCOPY W/ RETROGRADES Bilateral 11/20/2020   Procedure: CYSTOSCOPY WITH RETROGRADE PYELOGRAM;  Surgeon: Marcine Matar, MD;  Location: [facility];  Service: Urology;  Laterality: Bilateral;   CYSTOSCOPY W/ RETROGRADES Right 02/12/2021   Procedure: CYSTOSCOPY WITH RETROGRADE PYELOGRAM;  Surgeon: Marcine Matar, MD;  Location: [facility];  Service: Urology;  Laterality: Right;   CYSTOSCOPY W/ RETROGRADES Bilateral 06/25/2021   Procedure: CYSTOSCOPY WITH RETROGRADE PYELOGRAM;  Surgeon: Marcine Matar, MD;  Location: [facility];  Service: Urology;  Laterality: Bilateral;   ESOPHAGOGASTRODUODENOSCOPY N/A 01/30/2014   Procedure: ESOPHAGOGASTRODUODENOSCOPY (EGD);  Surgeon: Louis Meckel, MD;  Location: [facility];  Service: Endoscopy;  Laterality: N/A;   TONSILLECTOMY     TRANSURETHRAL RESECTION OF BLADDER TUMOR N/A 02/12/2021   Procedure: REPEAT TRANSURETHRAL RESECTION OF BLADDER TUMOR (TURBT);  Surgeon: Marcine Matar, MD;  Location: [facility];  Service: Urology;  Laterality: N/A;   TRANSURETHRAL RESECTION OF BLADDER TUMOR WITH MITOMYCIN-C  N/A 11/20/2020   Procedure: TRANSURETHRAL RESECTION OF BLADDER TUMOR;  Surgeon: Marcine Matar, MD;  Location: [facility];  Service: Urology;  Laterality: N/A;   TRANSURETHRAL RESECTION OF BLADDER TUMOR WITH MITOMYCIN-C N/A 06/25/2021   Procedure: TRANSURETHRAL RESECTION OF BLADDER TUMOR WITH GEMCITABINE;  Surgeon: Marcine Matar, MD;  Location: [facility];  Service: Urology;  Laterality: N/A;  1 HR   TRANSURETHRAL RESECTION OF BLADDER TUMOR WITH MITOMYCIN-C N/A 12/10/2021   Procedure: TRANSURETHRAL RESECTION OF BLADDER TUMOR WITH GEMCITABINE;  Surgeon: Marcine Matar, MD;  Location: [facility];  Service: Urology;  Laterality: N/A;  1 HR   TRANSURETHRAL RESECTION OF BLADDER TUMOR WITH MITOMYCIN-C N/A 05/27/2022   Procedure: TRANSURETHRAL RESECTION OF BLADDER TUMOR;  Surgeon: Marcine Matar, MD;  Location: [facility];  Service: Urology;  Laterality: N/A;     Home Medications:  Prior to Admission medications   Medication Sig Start Date End Date Taking? Authorizing Provider  apixaban (ELIQUIS) 2.5 MG TABS tablet Take 1 tablet (2.5 mg total) by mouth 2 (two) times daily. 07/27/22  Yes Lanae Boast, MD  ascorbic acid (VITAMIN C) 500 MG tablet Take 500 mg by mouth daily.   Yes [provider]  atorvastatin (LIPITOR) 40 MG tablet Take 1 tablet (40  mg total) by mouth daily. 07/28/22  Yes Kc, Dayna Barker, MD  escitalopram (LEXAPRO) 10 MG tablet Take 10 mg by mouth daily.   Yes [provider]  ferrous sulfate 325 (65 FE) MG tablet Take 325 mg by mouth daily with breakfast.   Yes [provider]  metoprolol tartrate (LOPRESSOR) 25 MG tablet Take 0.5 tablets (12.5 mg total) by mouth 2 (two) times daily. 07/28/22  Yes Dow Adolph N, DO  OXYGEN Inhale 3 L into the lungs continuous.   Yes [provider]  tamsulosin (FLOMAX) 0.4 MG CAPS capsule Take 1 capsule (0.4 mg total) by mouth daily after supper. 07/27/22  Yes Kc, Dayna Barker, MD  umeclidinium-vilanterol (ANORO ELLIPTA) 62.5-25  MCG/ACT AEPB Inhale 1 puff into the lungs daily. 07/28/22  Yes Lanae Boast, MD  feeding supplement (ENSURE ENLIVE / ENSURE PLUS) LIQD Take 237 mLs by mouth 2 (two) times daily between meals. 07/27/22   Lanae Boast, MD  loratadine (CLARITIN) 10 MG tablet Take 10 mg by mouth daily.    [provider]  Multiple Vitamin (MULTIVITAMIN WITH MINERALS) TABS tablet Take 1 tablet by mouth daily with breakfast.    [provider]  polyethylene glycol (MIRALAX / GLYCOLAX) 17 g packet Take 17 g by mouth daily as needed for mild constipation. 07/27/22   Lanae Boast, MD  saccharomyces boulardii (FLORASTOR) 250 MG capsule Take 1 capsule (250 mg total) by mouth 2 (two) times daily. 07/05/21   Leatha Gilding, MD    Inpatient Medications: Scheduled Meds:  aspirin  81 mg Oral Daily   atorvastatin  40 mg Oral Daily   diltiazem  10 mg Intravenous Once   diltiazem  60 mg Oral Q8H   escitalopram  10 mg Oral Daily   feeding supplement  237 mL Oral BID BM   metoprolol tartrate  12.5 mg Oral BID   pantoprazole  40 mg Oral BID AC   polyethylene glycol  17 g Oral Daily   saccharomyces boulardii  250 mg Oral BID   tamsulosin  0.4 mg Oral QPC supper   umeclidinium-vilanterol  1 puff Inhalation Daily   Continuous Infusions:  cefTRIAXone (ROCEPHIN)  IV 1 g (08/03/22 0941)   diltiazem (CARDIZEM) infusion     magnesium sulfate bolus IVPB     magnesium sulfate bolus IVPB 2 g (08/03/22 1037)   thiamine (VITAMIN B1) injection Stopped (08/02/22 1235)   PRN Meds: acetaminophen, diltiazem, hydrALAZINE, levalbuterol, metoprolol tartrate, ondansetron **OR** [DISCONTINUED] ondansetron (ZOFRAN) IV  Allergies:   No Known Allergies  Social History:   Social History   Socioeconomic History   Marital status: Married    Spouse name: Not on file   Number of children: Not on file   Years of education: Not on file   Highest education level: Not on file  Occupational History   Occupation: retired  Tobacco Use    Smoking status: Former    Years: 47    Types: Cigarettes    Start date: 1947    Quit date: 1994    Years since quitting: 30.5   Smokeless tobacco: Never  Vaping Use   Vaping Use: Never used  Substance and Sexual Activity   Alcohol use: Not Currently   Drug use: Never   Sexual activity: Not Currently  Other Topics Concern   Not on file  Social History Narrative   Not on file   Social Determinants of Health   Financial Resource Strain: Not on file  Food Insecurity: No Food  Insecurity (07/22/2022)   Hunger Vital Sign    Worried About Running Out of Food in the Last Year: Never true    Ran Out of Food in the Last Year: Never true  Transportation Needs: No Transportation Needs (07/22/2022)   PRAPARE - Administrator, Civil Service (Medical): No    Lack of Transportation (Non-Medical): No  Physical Activity: Not on file  Stress: Not on file  Social Connections: Not on file  Intimate Partner Violence: Not At Risk (07/22/2022)   Humiliation, Afraid, Rape, and Kick questionnaire    Fear of Current or Ex-Partner: No    Emotionally Abused: No    Physically Abused: No    Sexually Abused: No    Family History:    Family History  Problem Relation Age of Onset   Hypertension Mother    Stroke Father      ROS:  Please see the history of present illness.   All other ROS reviewed and negative.     Physical Exam/Data:   Vitals:   08/03/22 0400 08/03/22 0433 08/03/22 0745 08/03/22 0800  BP: (!) 114/53   126/70  Pulse: 66 89  89  Resp: 12 19  (!) 22  Temp: (!) 97.5 F (36.4 C)     TempSrc: Axillary     SpO2: 97% 95% 99% 99%  Weight: 59.8 kg       Intake/Output Summary (Last 24 hours) at 08/03/2022 1135 Last data filed at 08/03/2022 0200 Gross per 24 hour  Intake 1079.1 ml  Output 1900 ml  Net -820.9 ml      08/03/2022    4:00 AM 07/27/2022    3:43 AM 07/26/2022    4:49 AM  Last 3 Weights  Weight (lbs) 131 lb 13.4 oz 133 lb 13.1 oz 134 lb 4.2 oz  Weight  (kg) 59.8 kg 60.7 kg 60.9 kg     Body mass index is 21.28 kg/m.  Patient seen with Dr. Ronette Deter who performed most of the physical exam. See his attestation for complete physical exam   General:  Elderly male, laying flat in the bed. Asleep, but arouses easily to voice. No acute distress  HEENT: normal Lungs:  breathing unlabored. Wearing [place]   Ext: no edema in BLE Musculoskeletal:  No deformities Skin: warm and dry  Neuro:  CNs 2-12 intact, no focal abnormalities noted Psych:  Normal affect   EKG:  The EKG was personally reviewed and demonstrates:  atrial flutter, HR 137 BPM. Interpretation reads inferior STEMI, but on my review, there is not ST elevation present in the inferior leads  Telemetry:  Telemetry was personally reviewed and demonstrates:  At around 930 AM, patient went into aflutter with HR in the 120s-130s   Relevant CV Studies: Cardiac Studies & Procedures       ECHOCARDIOGRAM  ECHOCARDIOGRAM COMPLETE 07/21/2022  Narrative ECHOCARDIOGRAM REPORT    Patient Name:   Gerald Valdez Date of Exam: 07/21/2022 Medical Rec #:  [id number]       Height:       66.0 in Accession #:    [id number]      Weight:       145.9 lb Date of Birth:  [birth year]       BSA:          1.749 m Patient Age:    89 years        BP:           108/53 mmHg Patient Gender: M  HR:           97 bpm. Exam Location:  Inpatient  Procedure: 2D Echo, Cardiac Doppler and Color Doppler  Indications:    CHF-Acute Diastolic I50.31  History:        Patient has prior history of Echocardiogram examinations, most recent 02/11/2020. CKD 3 and COPD; Risk Factors:Hypertension, Dyslipidemia and Former Smoker.  Sonographer:    Dondra Prader RVT RCS Referring Phys: [id number] EMILY B MULLEN   Sonographer Comments: Technically challenging study due to limited acoustic windows, Technically difficult study due to poor echo windows, suboptimal parasternal window, suboptimal apical window and suboptimal  subcostal window. Image acquisition challenging due to respiratory motion, Image acquisition challenging due to patient body habitus and Image acquisition challenging due to uncooperative patient. IMPRESSIONS   1. Left ventricular ejection fraction, by estimation, is 45 to 50%. The left ventricle has mildly decreased function. The left ventricle has no regional wall motion abnormalities. There is mild left ventricular hypertrophy. Left ventricular diastolic parameters are indeterminate. 2. Right ventricular systolic function is normal. The right ventricular size is mildly enlarged. 3. Right atrial size was mildly dilated. 4. A small pericardial effusion is present. The pericardial effusion is anterior to the right ventricle. There is no evidence of cardiac tamponade. 5. The mitral valve is degenerative. Mild to moderate mitral valve regurgitation. 6. The aortic valve was not well visualized. Aortic valve regurgitation is not visualized.  Comparison(s): Prior images reviewed side by side. LVEF appears slightly decreased; this study is more technically difficult than prior.  FINDINGS Left Ventricle: LVOT calcification unchanged from prior. Left ventricular ejection fraction, by estimation, is 45 to 50%. The left ventricle has mildly decreased function. The left ventricle has no regional wall motion abnormalities. The left ventricular internal cavity size was normal in size. There is mild left ventricular hypertrophy. Left ventricular diastolic parameters are indeterminate.  Right Ventricle: The right ventricular size is mildly enlarged. No increase in right ventricular wall thickness. Right ventricular systolic function is normal.  Left Atrium: Left atrial size was normal in size.  Right Atrium: Right atrial size was mildly dilated.  Pericardium: A small pericardial effusion is present. The pericardial effusion is anterior to the right ventricle. There is no evidence of cardiac  tamponade.  Mitral Valve: The mitral valve is degenerative in appearance. Mild to moderate mitral valve regurgitation.  Tricuspid Valve: The tricuspid valve is normal in structure. Tricuspid valve regurgitation is mild . No evidence of tricuspid stenosis.  Aortic Valve: The aortic valve was not well visualized. Aortic valve regurgitation is not visualized. Aortic valve mean gradient measures 4.0 mmHg. Aortic valve peak gradient measures 6.7 mmHg. Aortic valve area, by VTI measures 1.77 cm.  Pulmonic Valve: The pulmonic valve was not well visualized. Pulmonic valve regurgitation is not visualized.  Aorta: The aortic root is normal in size and structure and the ascending aorta was not well visualized.  IAS/Shunts: No atrial level shunt detected by color flow Doppler.   LEFT VENTRICLE PLAX 2D LVIDd:         4.20 cm   Diastology LVIDs:         3.30 cm   LV e' medial:    6.50 cm/s LV PW:         1.10 cm   LV E/e' medial:  10.7 LV IVS:        0.90 cm   LV e' lateral:   8.18 cm/s LVOT diam:     1.90 cm   LV  E/e' lateral: 8.5 LV SV:         38 LV SV Index:   22 LVOT Area:     2.84 cm   RIGHT VENTRICLE             IVC RV Basal diam:  3.90 cm     IVC diam: 2.50 cm RV S prime:     11.70 cm/s TAPSE (M-mode): 1.7 cm  LEFT ATRIUM           Index        RIGHT ATRIUM           Index LA diam:      3.90 cm 2.23 cm/m   RA Area:     18.20 cm LA Vol (A2C): 48.6 ml 27.79 ml/m  RA Volume:   56.90 ml  32.53 ml/m LA Vol (A4C): 37.5 ml 21.44 ml/m AORTIC VALVE AV Area (Vmax):    1.62 cm AV Area (Vmean):   1.57 cm AV Area (VTI):     1.77 cm AV Vmax:           129.00 cm/s AV Vmean:          88.200 cm/s AV VTI:            0.216 m AV Peak Grad:      6.7 mmHg AV Mean Grad:      4.0 mmHg LVOT Vmax:         73.70 cm/s LVOT Vmean:        48.800 cm/s LVOT VTI:          0.135 m LVOT/AV VTI ratio: 0.62  AORTA Ao Root diam: 3.40 cm  MITRAL VALVE               TRICUSPID VALVE MV Area (PHT):  4.67 cm    TR Peak grad:   30.2 mmHg MV Decel Time: 163 msec    TR Vmax:        275.00 cm/s MV E velocity: 69.85 cm/s MV A velocity: 61.70 cm/s  SHUNTS MV E/A ratio:  1.13        Systemic VTI:  0.14 m Systemic Diam: 1.90 cm  Riley Lam MD Electronically signed by Riley Lam MD Signature Date/Time: 07/21/2022/5:42:19 PM    Final              Laboratory Data:  High Sensitivity Troponin:   Recent Labs  Lab 07/19/22 1619 07/21/22 1001 07/21/22 1201  TROPONINIHS 42* 47* 47*     Chemistry Recent Labs  Lab 08/01/22 1354 08/01/22 1403 08/02/22 0555 08/02/22 0933 08/03/22 0232 08/03/22 0604  NA 134*   < > 131* 136 137  --   K 5.0   < > 5.1 4.1 4.4  --   CL 91*  --  93*  --  94*  --   CO2 31  --  29  --  35*  --   GLUCOSE 114*  --  73  --  106*  --   BUN 16  --  15  --  13  --   CREATININE 1.12  --  1.04  --  1.11  --   CALCIUM 8.9  --  8.5*  --  8.5*  --   MG  --   --   --   --   --  1.7  GFRNONAA >60  --  >60  --  >60  --   ANIONGAP 12  --  9  --  8  --    < > = values in this interval not displayed.    Recent Labs  Lab 08/02/22 0555  PROT 5.2*  ALBUMIN 3.0*  AST 33  ALT 35  ALKPHOS 32*  BILITOT 0.6   Lipids No results for input(s): "CHOL", "TRIG", "HDL", "LABVLDL", "LDLCALC", "CHOLHDL" in the last 168 hours.  Hematology Recent Labs  Lab 08/01/22 1354 08/01/22 1403 08/02/22 0502 08/02/22 0933 08/03/22 0232  WBC 10.2  --   --   --  8.1  RBC 3.20*  --   --   --  3.16*  HGB 9.3*   < > 10.2* 8.8* 9.2*  HCT 31.0*   < > 30.0* 26.0* 30.2*  MCV 96.9  --   --   --  95.6  MCH 29.1  --   --   --  29.1  MCHC 30.0  --   --   --  30.5  RDW 13.2  --   --   --  13.2  PLT 207  --   --   --  194   < > = values in this interval not displayed.   Thyroid  Recent Labs  Lab 08/03/22 0604  TSH 4.079    BNP Recent Labs  Lab 08/03/22 0604  BNP 509.5*    DDimer No results for input(s): "DDIMER" in the last 168 hours.   Radiology/Studies:   DG Chest Port 1 View  Result Date: 08/03/2022 CLINICAL DATA:  89 year old male with history of shortness of breath. EXAM: PORTABLE CHEST 1 VIEW COMPARISON:  Chest x-ray 08/31/2022. FINDINGS: Poorly defined opacity in the right lung base partially obscuring the right hemidiaphragm, which may reflect some airspace consolidation and/or presence of a small right pleural effusion. Left lung is clear. No left pleural effusion. No pneumothorax. No evidence of pulmonary edema. Heart size is normal. Upper mediastinal contours are within normal limits. Atherosclerotic calcifications are noted in the thoracic aorta. IMPRESSION: 1. New opacity in the right lung base partially obscuring the right hemidiaphragm concerning for developing right lower lobe airspace consolidation with small superimposed right pleural effusion. 2. Aortic atherosclerosis. Electronically Signed   By: Trudie Reed M.D.   On: 08/03/2022 06:16   EEG adult  Result Date: 08/02/2022 Gerald Quest, MD     08/02/2022  1:51 PM Patient Name: Gerald Valdez MRN: [id number] Epilepsy Attending: Charlsie Valdez Referring Physician/Provider: Tereasa Coop, MD Date: 08/02/2022 Duration: 24.47 mins Patient history: 89yo M with 1 episode of seizure-like activity at nursing home and en route to EMS for seizure-like episode. EMS reported bilateral hand tremor. EEG to evaluate for seizure. Level of alertness: Awake, asleep AEDs during EEG study: None Technical aspects: This EEG study was done with scalp electrodes positioned according to the 10-20 International system of electrode placement. Electrical activity was reviewed with band pass filter of 1-70Hz , sensitivity of 7 uV/mm, display speed of 11mm/sec with a 60Hz  notched filter applied as appropriate. EEG data were recorded continuously and digitally stored.  Video monitoring was available and reviewed as appropriate. Description: No posterior dominant rhythm was seen. Sleep was characterized by vertex  waves, sleep spindles (12 to 14 Hz), maximal frontocentral region. EEG showed continuous generalized 3 to 6 Hz theta-delta slowing. Hyperventilation and photic stimulation were not performed.   ABNORMALITY - Continuous slow, generalized IMPRESSION: This study is suggestive of moderate diffuse encephalopathy, nonspecific etiology. No seizures or epileptiform discharges were seen throughout the recording. Gerald Valdez   CT  ABDOMEN PELVIS W CONTRAST  Result Date: 08/01/2022 CLINICAL DATA:  Hematuria EXAM: CT ABDOMEN AND PELVIS WITH CONTRAST TECHNIQUE: Multidetector CT imaging of the abdomen and pelvis was performed using the standard protocol following bolus administration of intravenous contrast. RADIATION DOSE REDUCTION: This exam was performed according to the departmental dose-optimization program which includes automated exposure control, adjustment of the mA and/or kV according to patient size and/or use of iterative reconstruction technique. CONTRAST:  60mL OMNIPAQUE IOHEXOL 350 MG/ML SOLN COMPARISON:  CT abdomen and pelvis 07/01/2021 FINDINGS: Lower chest: Emphysema. Small right pleural effusion and atelectasis. Hepatobiliary: No focal liver abnormality is seen. No gallstones, gallbladder wall thickening, or biliary dilatation. Pancreas: Unremarkable. Spleen: Unremarkable. Adrenals/Urinary Tract: Normal adrenal glands. Bilateral cortical renal scarring. No urinary calculi or hydronephrosis. Foley catheter in the bladder. Locules of gas within the bladder. There is hyperdense fluid within the bladder which may represent blood products. The bladder wall is irregular and thickened with mild adjacent stranding compatible with cystitis. Stomach/Bowel: Normal caliber large and small bowel. Extensive sigmoid diverticulosis without evidence of diverticulitis. Moderate stool ball in the rectum. Stomach is within normal limits. Vascular/Lymphatic: Aortic atherosclerosis. No enlarged abdominal or pelvic lymph  nodes. Reproductive: No acute abnormality. Other: No free intraperitoneal fluid or air. Musculoskeletal: Demineralization.  No acute fracture. IMPRESSION: 1. Findings suspicious for hemorrhagic cystitis. 2. Small right pleural effusion and atelectasis. Aortic Atherosclerosis (ICD10-I70.0). Electronically Signed   By: Minerva Fester M.D.   On: 08/01/2022 23:59   MR BRAIN WO CONTRAST  Result Date: 08/01/2022 CLINICAL DATA:  Delirium EXAM: MRI HEAD WITHOUT CONTRAST TECHNIQUE: Multiplanar, multiecho pulse sequences of the brain and surrounding structures were obtained without intravenous contrast. COMPARISON:  None Available. FINDINGS: Brain: No acute infarct, mass effect or extra-axial collection. No acute or chronic hemorrhage. There is confluent hyperintense T2-weighted signal within the white matter. Generalized volume loss. The midline structures are normal. Vascular: Major flow voids are preserved. Skull and upper cervical spine: Normal calvarium and skull base. Visualized upper cervical spine and soft tissues are normal. Sinuses/Orbits:No paranasal sinus fluid levels or advanced mucosal thickening. Small amount of left mastoid fluid. There are bilateral lens replacements. IMPRESSION: 1. No acute intracranial abnormality. 2. Chronic small vessel ischemia and volume loss. Electronically Signed   By: Deatra Robinson M.D.   On: 08/01/2022 22:06   CT Head Wo Contrast  Result Date: 08/01/2022 CLINICAL DATA:  Altered level of consciousness EXAM: CT HEAD WITHOUT CONTRAST TECHNIQUE: Contiguous axial images were obtained from the base of the skull through the vertex without intravenous contrast. RADIATION DOSE REDUCTION: This exam was performed according to the departmental dose-optimization program which includes automated exposure control, adjustment of the mA and/or kV according to patient size and/or use of iterative reconstruction technique. COMPARISON:  None Available. FINDINGS: Brain: Evaluation is limited by  streak artifact. No evidence of acute infarct or hemorrhage. Chronic appearing small vessel ischemic changes are seen throughout the periventricular white matter. The lateral ventricles and midline structures are unremarkable. No acute extra-axial fluid collections. No mass effect. Vascular: No hyperdense vessel or unexpected calcification. Skull: Normal. Negative for fracture or focal lesion. Sinuses/Orbits: No acute finding. Other: None. IMPRESSION: 1. No acute intracranial process. Electronically Signed   By: Sharlet Salina M.D.   On: 08/01/2022 15:23   DG Chest Port 1 View  Result Date: 08/01/2022 CLINICAL DATA:  Weakness EXAM: PORTABLE CHEST 1 VIEW COMPARISON:  07/19/2022 FINDINGS: Stable cardiomediastinal contours. Aortic atherosclerosis. Improved aeration at the right lung base. No new  focal airspace consolidation. No pleural effusion or pneumothorax. IMPRESSION: No active disease. Electronically Signed   By: Duanne Guess D.O.   On: 08/01/2022 14:39     Assessment and Plan:   Paroxysmal Atrial Flutter Abnormal EKG  - Patient was first diagnosed with atrial flutter earlier this month- was discharged on metoprolol tartrate 12.5 mg BID and eliquis 2.5 mg  - Eliquis held due to hematuria (patient has bladder cancer and acute hemorrhagic cystitis)  - Patient presented in normal sinus rhythm and converted to atrial flutter with HR in the 120s-130s around 9:30 this AM - Increase metoprolol tartrate to 25 mg BID. Can further increase dose as needed for HR control. Patient already received 12.5 mg this AM, so I ordered a one time dose of metoprolol tartrate 12.5 mg to be given now   - Most recent echocardiogram from 6/12 showed EF 45-50%. Given reduced EF, would avoid cardizem  - If HR remains difficult to control, can consider amiodarone - No plans for DCCV at this time- patient is unable to tolerate anticoagulation due to hematuria  - EKG this AM was interpreted as inferior STEMI -- patient  denies chest pain. EKG changes are likely due to tachycardia. HsTn pending, but unlikely this is an MI. Hold off on heparin   Coronary Calcifications - Noted on chest CT in 2022  - Patient denies anginal symptoms  - hsTn pending as above, but patient is not an ideal candidate for cardiac catheterization given ongoing hematuria. No plans for catheterization at this time  - Patient is 89 years old and DNR- per discussion with family, plan to continue medications that improve his quality of life in the short term and stop medications that are more for risk reduction. OK to stop statin therapy   Chronic systolic heart failure  - Most recent echo from 6/12 showed EF 45-50%  - Continue BB  - OK to use low dose lasix as needed for diuresis. Overall, he is euvolemic on exam   Otherwise per primary  - Diffuse upper extremity myoclonic jerks  - COPD, acute on chronic hypoxic and hypercarbic respiratory failure  - Acute metabolic encephalopathy  - Acute hemorrhagic cystitis  - History of bladder cancer with chronic foley  Risk Assessment/Risk Scores:   CHA2DS2-VASc Score = 3   This indicates a 3.2% annual risk of stroke. The patient's score is based upon: CHF History: 0 HTN History: 1 Diabetes History: 0 Stroke History: 0 Vascular Disease History: 0 Age Score: 2 Gender Score: 0    For questions or updates, please contact [facility] Please consult [email or web address] for contact info under    Signed, Jonita Albee, PA-C  08/03/2022 11:35 AM  Personally seen and examined. Agree with APP above with the following comments:  Briefly 89 yo M with PAF, LAD CAC on CT with HLD, HTN, COPD, Bladder cancer complicated by new hematuria, CKD stage IIIa who presented with seizures This AM went into AF RVR; concerns for inferior STEMI based on computer EKG read. Patient notes no symptoms.  No chest pain; no palpitations with tachycardia.  He notes he is doing fairly well for a 89 year  old guy.  Family notes that his bleeding from his hematuria and cancer has improved off of low dose DOAC. Exam notable for IRIR tachycardia, elderly male who appears chronically ill, no LE edema; he is thin.  Slight expiratory wheeze other wise as above.  Labs notable for hs troponin < 100 EKG AF  RVR with TWI; does not meet STEMI criteria Tele: SR-> AF RVR with elevated rates  Would recommend  - discussed at length with family; he and they would prefer a conservative approach.  He is asymptomatic from his AF could not tolerate procedures due to bleeding issues - will increase metoprolol; if he tolerates this well can consolidate to succinate - if still breakthrough RVR will start PO amiodarone - do not plan DCCV or titration GDMT - families GOC are for him to come home with no rehab; we will not restart his home statin and will likely not attempt to restart his home DOAC due to bleeding  Riley Lam, MD FASE [facility] Cardiologist [facility]  [address], [address] [place], [place] [address] [phone]  1:02 PM

## 2022-08-03 NOTE — Progress Notes (Signed)
   08/02/22 2001  BiPAP/CPAP/SIPAP  $ Face Mask Medium Yes  BiPAP/CPAP/SIPAP Pt Type Adult  BiPAP/CPAP/SIPAP Resmed  Mask Type Full face mask  Mask Size Medium  IPAP 16 cmH20  EPAP 6 cmH2O  Pressure Support 10 cmH20  Flow Rate 2 lpm  Patient Home Equipment No  Auto Titrate No  Safety Check Completed by RT for Home Unit Yes, no issues noted

## 2022-08-03 NOTE — Progress Notes (Signed)
Pt taken off Cpap and placed on 3L [place] by RT. Pt tolerating well, vitals stable, No increased WOB noted,SPO2 99%, RN notifed, MD at bedside, RT will monitor as needed.     08/03/22 0745  Respiratory Assessment  Assessment Type Assess only  Respiratory Pattern Regular;Unlabored  Chest Assessment Chest expansion symmetrical  Cough None  Bilateral Breath Sounds Diminished  Oxygen Therapy/Pulse Ox  O2 Device (S)  Nasal Cannula  O2 Therapy Oxygen  O2 Flow Rate (L/min) 3 L/min

## 2022-08-03 NOTE — Discharge Instructions (Signed)

## 2022-08-03 NOTE — Progress Notes (Signed)
PT Cancellation Note  Patient Details Name: Gerald Valdez MRN: [id number] DOB: [birth year]   Cancelled Treatment:    Reason Eval/Treat Not Completed: Medical issues which prohibited therapy. Per discussion with RN, pt having cardiac issues, requesting to hold PT evaluation at this time. Acute PT will follow up as appropriate/available for evaluation later. Thank you.    Leonie Man 08/03/2022, 10:59 AM

## 2022-08-03 NOTE — Progress Notes (Addendum)
PROGRESS NOTE                                                                                                                                                                                                             Patient Demographics:    Gerald Valdez, is a 89 y.o. male, DOB - 1928-02-13, QMV:[id number]  Outpatient Primary MD for the patient is Avva, Ravisankar, MD    LOS - 1  Admit date - 08/01/2022    Chief Complaint  Patient presents with   Seizures       Brief Narrative (HPI from H&P)   89 year old man with history significant of bladder cancer on chronic Foley, chronic hypoxic and hypercarbic respiratory failure on 3 to oxygen, COPD, HFpEF EF 40 to 45%, hypertension, hyperlipidemia, chronic anemia, CKD stage 3A and atrial fibrillation on Eliquis 2.5 twice daily presented to emergency department from nursing home with concern for seizure.   In the ER workup consistent with hypercarbic respiratory failure, UTI, possible myoclonic jerks, had CT brain, MRI brain and EEG which were unremarkable.  He was initially placed on BiPAP but now liberated off of it.  Transferred to my care on 08/03/2022.   Subjective:    Gerald Valdez today has, No headache, No chest pain, No abdominal pain - No Nausea, No new weakness tingling or numbness, no SOB.   Assessment  & Plan :   Diffuse upper extremity myoclonic jerks.  Less likely seizures, brought on by combination of hypercarbic, hypoxic respiratory failure and UTI. Case discussed with Dr. Amada Jupiter neurologist in detail on 08/03/2022, imaging and EEG reviewed with him, patient less likely had seizure it was more consistent with myoclonic jerks in the setting of hypoxia, hypercarbia and UTI.  No AEDs are required, CT head, MRI brain and EEG unremarkable.  Continue Rocephin, gentle hydration, oxygen supplementation, he is currently liberated off of BiPAP, will be monitored off of it.   Advance activity, PT OT, wife now wants to take him home once he is stable for discharge.   Acute on chronic hypoxic and hypercarbic respiratory failure.  Due to COPD on 3 L nasal cannula oxygen at all times.  Likely had mild COPD exacerbation, required BiPAP initially, now off of BiPAP and doing well on 3 L nasal cannula oxygen, no wheezing, low-dose steroids, continue antibiotic for  UTI, no signs of active infection.  Continue to monitor.  Of note he recently had pneumonia in the right lower lobe from which she is recovering, BiPAP on a scheduled basis on him is not a good clinical thought as he is high risk for aspiration and has already aspirated once recently, has extremely poor cough reflex and extremely failed.  BiPAP on him should be used as a last resort.  Acute metabolic encephalopathy - due to combination of UTI and respiratory failure above, resolved, monitor, minimize benzodiazepines and narcotics.  Acute hemorrhagic cystitis - with sepsis present on admission, continue Rocephin, urine has cleared, monitor urine cultures.  History of bladder cancer on chronic Foley, recent hematuria with Eliquis- Patient had foley cathter replaced at the nursing home. He has been referred to Oncology for treatment.  Urine appears clear continue to monitor.  Follows with Dr. Retta Diones.  Chronic systolic CHF preserved EF 40 to 45% (congestive heart failure) (HCC) - hold further IV fluids, currently compensated, gentle Lasix x 1 on 08/03/2022 and monitor.   Paroxysmal atrial fibrillation.  [place] vas 2 score of greater than 3, RVR this morning.  On low-dose beta-blocker, add oral Cardizem, he had significant hematuria with Eliquis and that has been held, family does not want to resume it right now, RVR this admission, Cardizem drip briefly, as needed IV Cardizem as well.  TSH is stable.  Will consult his cardiologist they were about to see him next week.  Placed on aspirin and statin for secondary prevention as  well.  He is not a candidate for invasive procedures as even DAPT would be high risk in terms of hematuria and recent bladder cancer.  Essential hypertension - Continue close blood pressure monitoring.  Home dose beta-blocker resumed.  Hyperlipidemia - Continue statin therapy.  CKD stage 3a, GFR 45-59 ml/min (HCC) -stable, no acute issues.  GERD (gastroesophageal reflux disease) - PPI  Moderate PCM.  Protein supplementation.  DNR (do not resuscitate) - family reasonable, if there is significant clinical decline they are open to pursuing full comfort measures.        Condition - Extremely Guarded  Family Communication  : Daughter and wife bedside on 08/03/2022  Code Status : DNR  Consults  : Discussed with Dr. Amada Jupiter neurologist in detail on 08/03/2022  PUD Prophylaxis : PPI   Procedures  :     CT abdomen and pelvis.  1. Findings suspicious for hemorrhagic cystitis. 2. Small right pleural effusion and atelectasis. Aortic Atherosclerosis   CT brain, MRI brain.  EEG.  All nonacute.      Disposition Plan  :    Status is: Inpatient  DVT Prophylaxis  :    Place and maintain sequential compression device Start: 08/03/22 1050 SCDs Start: 08/02/22 0438    Lab Results  Component Value Date   PLT 194 08/03/2022    Diet :  Diet Order             Diet regular Room service appropriate? Yes; Fluid consistency: Thin  Diet effective now                    Inpatient Medications  Scheduled Meds:  aspirin  81 mg Oral Daily   atorvastatin  40 mg Oral Daily   diltiazem  10 mg Intravenous Once   diltiazem  60 mg Oral Q8H   escitalopram  10 mg Oral Daily   feeding supplement  237 mL Oral BID BM   metoprolol tartrate  12.5 mg Oral BID   pantoprazole  40 mg Oral BID AC   polyethylene glycol  17 g Oral Daily   saccharomyces boulardii  250 mg Oral BID   tamsulosin  0.4 mg Oral QPC supper   umeclidinium-vilanterol  1 puff Inhalation Daily   Continuous Infusions:   cefTRIAXone (ROCEPHIN)  IV 1 g (08/03/22 0941)   diltiazem (CARDIZEM) infusion     magnesium sulfate bolus IVPB 2 g (08/03/22 1037)   thiamine (VITAMIN B1) injection Stopped (08/02/22 1235)   PRN Meds:.acetaminophen, diltiazem, hydrALAZINE, levalbuterol, metoprolol tartrate, ondansetron **OR** [DISCONTINUED] ondansetron (ZOFRAN) IV  Antibiotics  :    Anti-infectives (From admission, onward)    Start     Dose/Rate Route Frequency Ordered Stop   08/02/22 1000  cefTRIAXone (ROCEPHIN) 1 g in sodium chloride 0.9 % 100 mL IVPB        1 g 200 mL/hr over 30 Minutes Intravenous Every 24 hours 08/02/22 0517 08/07/22 0959   08/01/22 2015  cefTRIAXone (ROCEPHIN) 1 g in sodium chloride 0.9 % 100 mL IVPB        1 g 200 mL/hr over 30 Minutes Intravenous  Once 08/01/22 2009 08/01/22 2359         Objective:   Vitals:   08/03/22 0400 08/03/22 0433 08/03/22 0745 08/03/22 0800  BP: (!) 114/53   126/70  Pulse: 66 89  89  Resp: 12 19  (!) 22  Temp: (!) 97.5 F (36.4 C)     TempSrc: Axillary     SpO2: 97% 95% 99% 99%  Weight: 59.8 kg       Wt Readings from Last 3 Encounters:  08/03/22 59.8 kg  07/27/22 60.7 kg  05/27/22 60.2 kg     Intake/Output Summary (Last 24 hours) at 08/03/2022 1057 Last data filed at 08/03/2022 0200 Gross per 24 hour  Intake 1079.1 ml  Output 1900 ml  Net -820.9 ml     Physical Exam  Awake Alert, No new F.N deficits, Normal affect .AT,PERRAL Supple Neck, No JVD,   Symmetrical Chest wall movement, Good air movement bilaterally, CTAB RRR,No Gallops,Rubs or new Murmurs,  +ve B.Sounds, Abd Soft, No tenderness,   No Cyanosis, Clubbing or edema        Data Review:    Recent Labs  Lab 08/01/22 1354 08/01/22 1403 08/02/22 0129 08/02/22 0502 08/02/22 0933 08/03/22 0232  WBC 10.2  --   --   --   --  8.1  HGB 9.3* 10.5* 9.9* 10.2* 8.8* 9.2*  HCT 31.0* 31.0* 29.0* 30.0* 26.0* 30.2*  PLT 207  --   --   --   --  194  MCV 96.9  --   --   --   --  95.6   MCH 29.1  --   --   --   --  29.1  MCHC 30.0  --   --   --   --  30.5  RDW 13.2  --   --   --   --  13.2    Recent Labs  Lab 08/01/22 1354 08/01/22 1403 08/02/22 0129 08/02/22 0502 08/02/22 0555 08/02/22 0933 08/03/22 0232 08/03/22 0604  NA 134*   < > 132* 132* 131* 136 137  --   K 5.0   < > 4.8 4.9 5.1 4.1 4.4  --   CL 91*  --   --   --  93*  --  94*  --   CO2 31  --   --   --  29  --  35*  --   ANIONGAP 12  --   --   --  9  --  8  --   GLUCOSE 114*  --   --   --  73  --  106*  --   BUN 16  --   --   --  15  --  13  --   CREATININE 1.12  --   --   --  1.04  --  1.11  --   AST  --   --   --   --  33  --   --   --   ALT  --   --   --   --  35  --   --   --   ALKPHOS  --   --   --   --  32*  --   --   --   BILITOT  --   --   --   --  0.6  --   --   --   ALBUMIN  --   --   --   --  3.0*  --   --   --   CRP  --   --   --   --   --   --   --  0.5  PROCALCITON  --   --   --   --   --   --   --  <0.10  TSH  --   --   --   --   --   --   --  4.079  AMMONIA  --   --   --   --   --   --   --  <10  BNP  --   --   --   --   --   --   --  509.5*  MG  --   --   --   --   --   --   --  1.7  CALCIUM 8.9  --   --   --  8.5*  --  8.5*  --    < > = values in this interval not displayed.      Recent Labs  Lab 08/01/22 1354 08/02/22 0555 08/03/22 0232 08/03/22 0604  CRP  --   --   --  0.5  PROCALCITON  --   --   --  <0.10  TSH  --   --   --  4.079  AMMONIA  --   --   --  <10  BNP  --   --   --  509.5*  MG  --   --   --  1.7  CALCIUM 8.9 8.5* 8.5*  --       Recent Labs    08/03/22 0604  TSH 4.079      Radiology Reports DG Chest Port 1 View  Result Date: 08/03/2022 CLINICAL DATA:  89 year old male with history of shortness of breath. EXAM: PORTABLE CHEST 1 VIEW COMPARISON:  Chest x-ray 08/31/2022. FINDINGS: Poorly defined opacity in the right lung base partially obscuring the right hemidiaphragm, which may reflect some airspace consolidation and/or presence of a small  right pleural effusion. Left lung is clear. No left pleural effusion. No pneumothorax. No evidence of pulmonary edema. Heart size is normal. Upper mediastinal contours are within normal limits. Atherosclerotic calcifications are noted in the thoracic aorta. IMPRESSION: 1. New opacity in  the right lung base partially obscuring the right hemidiaphragm concerning for developing right lower lobe airspace consolidation with small superimposed right pleural effusion. 2. Aortic atherosclerosis. Electronically Signed   By: Trudie Reed M.D.   On: 08/03/2022 06:16   EEG adult  Result Date: 08/02/2022 Charlsie Quest, MD     08/02/2022  1:51 PM Patient Name: MESIAH MANZO MRN: [id number] Epilepsy Attending: Charlsie Quest Referring Physician/Provider: Tereasa Coop, MD Date: 08/02/2022 Duration: 24.47 mins Patient history: 89yo M with 1 episode of seizure-like activity at nursing home and en route to EMS for seizure-like episode. EMS reported bilateral hand tremor. EEG to evaluate for seizure. Level of alertness: Awake, asleep AEDs during EEG study: None Technical aspects: This EEG study was done with scalp electrodes positioned according to the 10-20 International system of electrode placement. Electrical activity was reviewed with band pass filter of 1-70Hz , sensitivity of 7 uV/mm, display speed of 72mm/sec with a 60Hz  notched filter applied as appropriate. EEG data were recorded continuously and digitally stored.  Video monitoring was available and reviewed as appropriate. Description: No posterior dominant rhythm was seen. Sleep was characterized by vertex waves, sleep spindles (12 to 14 Hz), maximal frontocentral region. EEG showed continuous generalized 3 to 6 Hz theta-delta slowing. Hyperventilation and photic stimulation were not performed.   ABNORMALITY - Continuous slow, generalized IMPRESSION: This study is suggestive of moderate diffuse encephalopathy, nonspecific etiology. No seizures or epileptiform  discharges were seen throughout the recording. Charlsie Quest   CT ABDOMEN PELVIS W CONTRAST  Result Date: 08/01/2022 CLINICAL DATA:  Hematuria EXAM: CT ABDOMEN AND PELVIS WITH CONTRAST TECHNIQUE: Multidetector CT imaging of the abdomen and pelvis was performed using the standard protocol following bolus administration of intravenous contrast. RADIATION DOSE REDUCTION: This exam was performed according to the departmental dose-optimization program which includes automated exposure control, adjustment of the mA and/or kV according to patient size and/or use of iterative reconstruction technique. CONTRAST:  60mL OMNIPAQUE IOHEXOL 350 MG/ML SOLN COMPARISON:  CT abdomen and pelvis 07/01/2021 FINDINGS: Lower chest: Emphysema. Small right pleural effusion and atelectasis. Hepatobiliary: No focal liver abnormality is seen. No gallstones, gallbladder wall thickening, or biliary dilatation. Pancreas: Unremarkable. Spleen: Unremarkable. Adrenals/Urinary Tract: Normal adrenal glands. Bilateral cortical renal scarring. No urinary calculi or hydronephrosis. Foley catheter in the bladder. Locules of gas within the bladder. There is hyperdense fluid within the bladder which may represent blood products. The bladder wall is irregular and thickened with mild adjacent stranding compatible with cystitis. Stomach/Bowel: Normal caliber large and small bowel. Extensive sigmoid diverticulosis without evidence of diverticulitis. Moderate stool ball in the rectum. Stomach is within normal limits. Vascular/Lymphatic: Aortic atherosclerosis. No enlarged abdominal or pelvic lymph nodes. Reproductive: No acute abnormality. Other: No free intraperitoneal fluid or air. Musculoskeletal: Demineralization.  No acute fracture. IMPRESSION: 1. Findings suspicious for hemorrhagic cystitis. 2. Small right pleural effusion and atelectasis. Aortic Atherosclerosis (ICD10-I70.0). Electronically Signed   By: Minerva Fester M.D.   On: 08/01/2022 23:59    MR BRAIN WO CONTRAST  Result Date: 08/01/2022 CLINICAL DATA:  Delirium EXAM: MRI HEAD WITHOUT CONTRAST TECHNIQUE: Multiplanar, multiecho pulse sequences of the brain and surrounding structures were obtained without intravenous contrast. COMPARISON:  None Available. FINDINGS: Brain: No acute infarct, mass effect or extra-axial collection. No acute or chronic hemorrhage. There is confluent hyperintense T2-weighted signal within the white matter. Generalized volume loss. The midline structures are normal. Vascular: Major flow voids are preserved. Skull and upper cervical spine: Normal calvarium and skull  base. Visualized upper cervical spine and soft tissues are normal. Sinuses/Orbits:No paranasal sinus fluid levels or advanced mucosal thickening. Small amount of left mastoid fluid. There are bilateral lens replacements. IMPRESSION: 1. No acute intracranial abnormality. 2. Chronic small vessel ischemia and volume loss. Electronically Signed   By: Deatra Robinson M.D.   On: 08/01/2022 22:06   CT Head Wo Contrast  Result Date: 08/01/2022 CLINICAL DATA:  Altered level of consciousness EXAM: CT HEAD WITHOUT CONTRAST TECHNIQUE: Contiguous axial images were obtained from the base of the skull through the vertex without intravenous contrast. RADIATION DOSE REDUCTION: This exam was performed according to the departmental dose-optimization program which includes automated exposure control, adjustment of the mA and/or kV according to patient size and/or use of iterative reconstruction technique. COMPARISON:  None Available. FINDINGS: Brain: Evaluation is limited by streak artifact. No evidence of acute infarct or hemorrhage. Chronic appearing small vessel ischemic changes are seen throughout the periventricular white matter. The lateral ventricles and midline structures are unremarkable. No acute extra-axial fluid collections. No mass effect. Vascular: No hyperdense vessel or unexpected calcification. Skull: Normal.  Negative for fracture or focal lesion. Sinuses/Orbits: No acute finding. Other: None. IMPRESSION: 1. No acute intracranial process. Electronically Signed   By: Sharlet Salina M.D.   On: 08/01/2022 15:23   DG Chest Port 1 View  Result Date: 08/01/2022 CLINICAL DATA:  Weakness EXAM: PORTABLE CHEST 1 VIEW COMPARISON:  07/19/2022 FINDINGS: Stable cardiomediastinal contours. Aortic atherosclerosis. Improved aeration at the right lung base. No new focal airspace consolidation. No pleural effusion or pneumothorax. IMPRESSION: No active disease. Electronically Signed   By: Duanne Guess D.O.   On: 08/01/2022 14:39      Signature  -   Susa Raring M.D on 08/03/2022 at 10:57 AM   -  To page go to [email or web address]

## 2022-08-03 NOTE — Progress Notes (Signed)
   08/03/22 2200  BiPAP/CPAP/SIPAP  BiPAP/CPAP/SIPAP Pt Type Adult  BiPAP/CPAP/SIPAP Resmed  Mask Type Full face mask  Mask Size Medium  Respiratory Rate 16 breaths/min  IPAP 16 cmH20  EPAP 6 cmH2O  Flow Rate 3 lpm  Patient Home Equipment No  Auto Titrate No  BiPAP/CPAP /SiPAP Vitals  Pulse Rate 74  Resp 16  SpO2 98 %

## 2022-08-03 NOTE — Progress Notes (Signed)
OT Cancellation Note  Patient Details Name: Gerald Valdez MRN: [id number] DOB: 17-Jun-1928   Cancelled Treatment:    Reason Eval/Treat Not Completed: (P) Medical issues which prohibited therapy, having cardiac issues, will follow up with OT when medically stable  Alexis Goodell 08/03/2022, 11:56 AM

## 2022-08-03 NOTE — Evaluation (Signed)
Clinical/Bedside Swallow Evaluation Patient Details  Name: Gerald Valdez MRN: [id number] Date of Birth: [birth year]  Today's Date: 08/03/2022 Time: SLP Start Time (ACUTE ONLY): 0810 SLP Stop Time (ACUTE ONLY): 0852 SLP Time Calculation (min) (ACUTE ONLY): 42 min  Past Medical History:  Past Medical History:  Diagnosis Date   Acute duodenal ulcer with bleeding 01/30/2014   Acute post-hemorrhagic anemia 01/30/2014   Anxiety    Aortic atherosclerosis (HCC) 02/10/2020   Arthritis    Basal cell carcinoma    Cancer (HCC)    bladder   Chronic kidney disease    COPD exacerbation (HCC) 02/10/2020   Coronary artery disease    Dyspnea    Dyspnea on exertion 02/10/2020   Emphysema of lung (HCC)    Essential hypertension 02/10/2020   GERD (gastroesophageal reflux disease) 02/10/2020   GI bleed 01/30/2014   Hyperlipidemia 02/10/2020   Past Surgical History:  Past Surgical History:  Procedure Laterality Date   BLADDER SURGERY     CATARACT EXTRACTION W/ INTRAOCULAR LENS IMPLANT     CYSTOSCOPY W/ RETROGRADES Bilateral 11/20/2020   Procedure: CYSTOSCOPY WITH RETROGRADE PYELOGRAM;  Surgeon: Marcine Matar, MD;  Location: [facility];  Service: Urology;  Laterality: Bilateral;   CYSTOSCOPY W/ RETROGRADES Right 02/12/2021   Procedure: CYSTOSCOPY WITH RETROGRADE PYELOGRAM;  Surgeon: Marcine Matar, MD;  Location: [facility];  Service: Urology;  Laterality: Right;   CYSTOSCOPY W/ RETROGRADES Bilateral 06/25/2021   Procedure: CYSTOSCOPY WITH RETROGRADE PYELOGRAM;  Surgeon: Marcine Matar, MD;  Location: [facility];  Service: Urology;  Laterality: Bilateral;   ESOPHAGOGASTRODUODENOSCOPY N/A 01/30/2014   Procedure: ESOPHAGOGASTRODUODENOSCOPY (EGD);  Surgeon: Louis Meckel, MD;  Location: [facility];  Service: Endoscopy;  Laterality: N/A;   TONSILLECTOMY     TRANSURETHRAL RESECTION OF BLADDER TUMOR N/A 02/12/2021   Procedure: REPEAT TRANSURETHRAL RESECTION OF BLADDER TUMOR (TURBT);  Surgeon:  Marcine Matar, MD;  Location: [facility];  Service: Urology;  Laterality: N/A;   TRANSURETHRAL RESECTION OF BLADDER TUMOR WITH MITOMYCIN-C N/A 11/20/2020   Procedure: TRANSURETHRAL RESECTION OF BLADDER TUMOR;  Surgeon: Marcine Matar, MD;  Location: [facility];  Service: Urology;  Laterality: N/A;   TRANSURETHRAL RESECTION OF BLADDER TUMOR WITH MITOMYCIN-C N/A 06/25/2021   Procedure: TRANSURETHRAL RESECTION OF BLADDER TUMOR WITH GEMCITABINE;  Surgeon: Marcine Matar, MD;  Location: [facility];  Service: Urology;  Laterality: N/A;  1 HR   TRANSURETHRAL RESECTION OF BLADDER TUMOR WITH MITOMYCIN-C N/A 12/10/2021   Procedure: TRANSURETHRAL RESECTION OF BLADDER TUMOR WITH GEMCITABINE;  Surgeon: Marcine Matar, MD;  Location: [facility];  Service: Urology;  Laterality: N/A;  1 HR   TRANSURETHRAL RESECTION OF BLADDER TUMOR WITH MITOMYCIN-C N/A 05/27/2022   Procedure: TRANSURETHRAL RESECTION OF BLADDER TUMOR;  Surgeon: Marcine Matar, MD;  Location: [facility];  Service: Urology;  Laterality: N/A;   HPI:  Patient is a 89 y.o. male with PMH: chronic respiratory failure with hypoxia, malnutrition, anxiety, COPD, CKD, GERD, dyspnea, GI bleed, CAD, basal cell carcinoma. Now adm with potential seizures. Recent adm 07/20/22 with c/o somnolence and confusion.  Prior Imaging showed possible PNA, current chest imaging showed ATX and small right pleural effusion.  Pt has been prescribed CPAP at night but per his wife, he has not been using it.  He required BiPAP on admit and now is on nasal cannula. Wife present and both pt/wife report no dysphagia.  Pt diagnosed with GERD previously - and ulcer in 2015 per endoscopy - he was placed on a PPI BID per Dr Nita Sells report.  Wife denies pt being on reflux medications recently and reports pt having frequent issues with nasal congestion.    Assessment / Plan / Recommendation  Clinical Impression  Patient greeted sitting upright in bed, fully alert and cooperative- has hearing  loss.  No focal CN deficits apparent and pt able to self feed.  Observed pt consuming 8 ounces water, 4 ounces OJ, 4 ounces applesauce, 2 graham crackers and saltine.  He does eat at rapid rate - though intake recently poor per notes. Wife reports pt states he gets full easily.  He is demonstratingsubtle indications of potential GERD and/or PES deficits based on clinical swallow evaluation. Frequent subtle erucation noted followed by throat clearing and cough most notably after thin liquids.  Cues to cough and expectorate were effective and pt is expectorating viscous secretions *yellow tinged x1.  Prior diagnosis of GERD, ulcer in 2015 noted.  Wife and pt both reports pt's swallow ability to be consistent with baseline - and throat clearing common.  Recommend regular/thin diet with general aspiration and esophageal precautions.  SLP educated pt and wife to importance to remove his dentures/partial at night *he does not* to help with bacterial load.    Advised them to monitor pt's swallowing today closely.  SLP will follow up x1 for education given pt just dc'd hospital after admit with potential pna; pt and spouse agreeable.  Recommend reg/thin diet with strict precautions. Using teach back, written instructions - all educated.  SLP Visit Diagnosis: Dysphagia, unspecified (R13.10)    Aspiration Risk  Mild aspiration risk    Diet Recommendation Regular;Thin liquid   Liquid Administration via: Straw;Cup Medication Administration: Whole meds with liquid Supervision: Patient able to self feed Compensations: Slow rate;Small sips/bites    Other  Recommendations Oral Care Recommendations: Oral care BID    Recommendations for follow up therapy are one component of a multi-disciplinary discharge planning process, led by the attending physician.  Recommendations may be updated based on patient status, additional functional criteria and insurance authorization.  Follow up Recommendations Follow physician's  recommendations for discharge plan and follow up therapies      Assistance Recommended at Discharge  TBD  Functional Status Assessment Patient has had a recent decline in their functional status and/or demonstrates limited ability to make significant improvements in function in a reasonable and predictable amount of time  Frequency and Duration min 1 x/week  1 week       Prognosis Prognosis for improved oropharyngeal function: Fair Barriers to Reach Goals: Other (Comment) (poor po intake, bladder cancer)      Swallow Study   General Date of Onset: 07/20/22 HPI: Patient is a 89 y.o. male with PMH: chronic respiratory failure with hypoxia, malnutrition, anxiety, COPD, CKD, GERD, dyspnea, GI bleed, CAD, basal cell carcinoma. Now adm with potential seizures. Recent adm 07/20/22 with c/o somnolence and confusion.  Prior Imaging showed possible PNA, current chest imaging showed ATX and small right pleural effusion.  Pt has been prescribed CPAP at night but per his wife, he has not been using it.  He required BiPAP on admit and now is on nasal cannula. Wife present and both pt/wife report no dysphagia.  Pt diagnosed with GERD previously - and ulcer in 2015 per endoscopy - he was placed on a PPI BID per Dr Nita Sells report.  Wife denies pt being on reflux medications recently and reports pt having frequent issues with nasal congestion. Type of Study: Bedside Swallow Evaluation Previous Swallow Assessment: one week ago Diet  Prior to this Study: NPO Temperature Spikes Noted: No Respiratory Status: Nasal cannula History of Recent Intubation: No Behavior/Cognition: Alert;Cooperative;Pleasant mood Oral Cavity Assessment: Dry Oral Care Completed by SLP: No Oral Cavity - Dentition: Dentures, top;Other (Comment) (lower partial) Vision: Functional for self-feeding Self-Feeding Abilities: Able to feed self Patient Positioning: Upright in bed Baseline Vocal Quality: Normal Volitional Cough: Strong  (productive to viscous secretions) Volitional Swallow: Able to elicit    Oral/Motor/Sensory Function Overall Oral Motor/Sensory Function: Within functional limits   Ice Chips Ice chips: Within functional limits Presentation: Spoon   Thin Liquid Presentation: Straw;Self Fed;Cup;Spoon Other Comments: delayed subtle erucation and cough noted    Nectar Thick Nectar Thick Liquid: Within functional limits (OJ) Presentation: Self Fed;Straw   Honey Thick Honey Thick Liquid: Not tested   Puree Puree: Within functional limits Presentation: Self Fed;Spoon   Solid     Solid: Within functional limits Presentation: Self Orvan July 08/03/2022,9:14 AM   Rolena Infante, MS [facility] SLP Acute Rehab Services Office [phone]

## 2022-08-04 DIAGNOSIS — J9601 Acute respiratory failure with hypoxia: Secondary | ICD-10-CM | POA: Diagnosis not present

## 2022-08-04 DIAGNOSIS — J9612 Chronic respiratory failure with hypercapnia: Secondary | ICD-10-CM | POA: Diagnosis not present

## 2022-08-04 LAB — BASIC METABOLIC PANEL
Anion gap: 11 (ref 5–15)
BUN: 15 mg/dL (ref 8–23)
CO2: 29 mmol/L (ref 22–32)
Calcium: 8.2 mg/dL — ABNORMAL LOW (ref 8.9–10.3)
Chloride: 94 mmol/L — ABNORMAL LOW (ref 98–111)
Creatinine, Ser: 1.36 mg/dL — ABNORMAL HIGH (ref 0.61–1.24)
GFR, Estimated: 49 mL/min — ABNORMAL LOW (ref >60)
Glucose, Bld: 93 mg/dL (ref 70–99)
Potassium: 4.4 mmol/L (ref 3.5–5.1)
Sodium: 134 mmol/L — ABNORMAL LOW (ref 135–145)

## 2022-08-04 LAB — CBC WITH DIFFERENTIAL/PLATELET
Abs Immature Granulocytes: 0.05 10*3/uL (ref 0.00–0.07)
Basophils Absolute: 0 10*3/uL (ref 0.0–0.1)
Basophils Relative: 1 %
Eosinophils Absolute: 0.2 10*3/uL (ref 0.0–0.5)
Eosinophils Relative: 2 %
HCT: 29.6 % — ABNORMAL LOW (ref 39.0–52.0)
Hemoglobin: 9.2 g/dL — ABNORMAL LOW (ref 13.0–17.0)
Immature Granulocytes: 1 %
Lymphocytes Relative: 18 %
Lymphs Abs: 1.6 10*3/uL (ref 0.7–4.0)
MCH: 30 pg (ref 26.0–34.0)
MCHC: 31.1 g/dL (ref 30.0–36.0)
MCV: 96.4 fL (ref 80.0–100.0)
Monocytes Absolute: 1 10*3/uL (ref 0.1–1.0)
Monocytes Relative: 11 %
Neutro Abs: 6 10*3/uL (ref 1.7–7.7)
Neutrophils Relative %: 67 %
Platelets: 181 10*3/uL (ref 150–400)
RBC: 3.07 MIL/uL — ABNORMAL LOW (ref 4.22–5.81)
RDW: 13.2 % (ref 11.5–15.5)
WBC: 8.9 10*3/uL (ref 4.0–10.5)
nRBC: 0 % (ref 0.0–0.2)

## 2022-08-04 LAB — C-REACTIVE PROTEIN: CRP: 0.5 mg/dL (ref <1.0)

## 2022-08-04 LAB — PROCALCITONIN: Procalcitonin: <0.1 ng/mL

## 2022-08-04 LAB — MAGNESIUM: Magnesium: 2.4 mg/dL (ref 1.7–2.4)

## 2022-08-04 LAB — BRAIN NATRIURETIC PEPTIDE: B Natriuretic Peptide: 59.8 pg/mL (ref 0.0–100.0)

## 2022-08-04 MED ORDER — METOPROLOL TARTRATE 25 MG PO TABS
37.5000 mg | ORAL_TABLET | Freq: Two times a day (BID) | ORAL | Status: DC
  Administered 2022-08-04 (×2): 37.5 mg via ORAL
  Filled 2022-08-04 (×3): qty 1

## 2022-08-04 NOTE — Progress Notes (Signed)
   08/04/22 2200  BiPAP/CPAP/SIPAP  BiPAP/CPAP/SIPAP Pt Type Adult  BiPAP/CPAP/SIPAP Resmed  Mask Type Full face mask  Mask Size Medium  Respiratory Rate 16 breaths/min  IPAP 16 cmH20  EPAP 6 cmH2O  Flow Rate 3 lpm  Patient Home Equipment No  Auto Titrate No  BiPAP/CPAP /SiPAP Vitals  Pulse Rate 76  Resp 16  SpO2 98 %  Bilateral Breath Sounds Diminished  MEWS Score/Color  MEWS Score 0  MEWS Score Color [place] Si

## 2022-08-04 NOTE — Evaluation (Signed)
Occupational Therapy Evaluation Patient Details Name: Gerald Valdez MRN: [id number] DOB: [birth year] Today's Date: 08/04/2022   History of Present Illness 89 y/o M admitted to [facility] on [facility]/23 for concern of seizure. CT and MRI of brain and EEG all unremarkable. PMHx: bladder cancer on chronic Foley, chronic hypoxic and hypercarbic respiratory failure on 3L O2, COPD, HFpEF, HTN, HLD, chronic anemia, CKD, a-fib on Eliquis   Clinical Impression   At baseline, pt completes UB ADLs Independent to Mod I, LB ADLs with Supervision to Min assist, and functional transfers/mobility with a Rollator or SPC with Mod I. Just prior to this admission pt was in SNF for short term rehab and was making progress per family and pt report. Pt currently presents with decreased activity tolerance, decreased balance, generalized B UE weakness, and decreased safety and independence with ADLs and functional transfers/mobility. Pt currently demonstrates ability to complete UB ADLs with Set up to Min assist, LB ADLs with Mod to Max assist, and functional transfers with a RW with Mod assist. Pt functional level limited during today's session by elevated HR and mild O2 desat with pt recovering well with seated rest breaks and pursed lip breathing. Pt will benefit from acute skilled OT services to address deficits outlined below, decrease caregiver burden, and to increase safety and independence with ADLs, functional transfers, and functional mobility. Post acute discharge, pt and family would like for pt to return home with 24 hour family support with family stating they are comfortable providing assistance to pt at his current level. Ptt will benefit from continued skilled OT services in the home to maximize rehab potential in combination with 24 hour family supervision/assistance.      Recommendations for follow up therapy are one component of a multi-disciplinary discharge planning process, led by the attending physician.   Recommendations may be updated based on patient status, additional functional criteria and insurance authorization.   Assistance Recommended at Discharge Frequent or constant Supervision/Assistance  Patient can return home with the following A lot of help with walking and/or transfers;A lot of help with bathing/dressing/bathroom;Assistance with cooking/housework;Assist for transportation;Help with stairs or ramp for entrance    Functional Status Assessment  Patient has had a recent decline in their functional status and demonstrates the ability to make significant improvements in function in a reasonable and predictable amount of time.  Equipment Recommendations  None recommended by OT    Recommendations for Other Services       Precautions / Restrictions Precautions Precautions: Other (comment);Fall Precaution Comments: monitor HR and BP Restrictions Weight Bearing Restrictions: No      Mobility Bed Mobility Overal bed mobility: Needs Assistance Bed Mobility: Sit to Supine, Supine to Sit     Supine to sit: Supervision, HOB elevated Sit to supine: Supervision, HOB elevated   General bed mobility comments: close supervision for safety and line management    Transfers Overall transfer level: Needs assistance Equipment used: Rolling walker (2 wheels) Transfers: Sit to/from Stand, Bed to chair/wheelchair/BSC Sit to Stand: Min assist Stand pivot transfers: Mod assist                Balance Overall balance assessment: Needs assistance Sitting-balance support: Bilateral upper extremity supported, Feet supported Sitting balance-Leahy Scale: Fair     Standing balance support: Bilateral upper extremity supported, During functional activity, Reliant on assistive device for balance Standing balance-Leahy Scale: Poor Standing balance comment: reliant on RW to maintain balance  ADL either performed or assessed with clinical judgement    ADL Overall ADL's : Needs assistance/impaired Eating/Feeding: Set up;Sitting   Grooming: Set up;Min guard;Sitting   Upper Body Bathing: Minimal assistance;Sitting (with increased time)   Lower Body Bathing: Moderate assistance;Sitting/lateral leans;Sit to/from stand;Cueing for compensatory techniques;Cueing for sequencing;Cueing for safety   Upper Body Dressing : Minimal assistance;Cueing for compensatory techniques;Sitting   Lower Body Dressing: Moderate assistance;Maximal assistance;Cueing for safety;Cueing for sequencing;Sit to/from stand;Sitting/lateral leans   Toilet Transfer: Moderate assistance;Cueing for safety;Stand-pivot;BSC/3in1;Rolling walker (2 wheels)   Toileting- Clothing Manipulation and Hygiene: Maximal assistance;Cueing for sequencing;Cueing for safety;Sitting/lateral lean;Sit to/from stand         General ADL Comments: Functional mobility deferred this session secondary to increased HR in sitting. Pt with decreased activity tolerance and funcitonal level currently affected by cardiopulmonary status.     Vision Baseline Vision/History: 1 Wears glasses Patient Visual Report: No change from baseline       Perception     Praxis Praxis Praxis tested?: Within functional limits    Pertinent Vitals/Pain Pain Assessment Pain Assessment: No/denies pain Facial Expression: smiling or inexpressive     Hand Dominance Right   Extremity/Trunk Assessment Upper Extremity Assessment Upper Extremity Assessment: Generalized weakness;RUE deficits/detail RUE Deficits / Details: Decreased shoulder AROM secondary to previous rotator cuff injury. Pt at shoulder AROM at baseline. RUE Sensation: WNL RUE Coordination: WNL   Lower Extremity Assessment Lower Extremity Assessment: Defer to PT evaluation   Cervical / Trunk Assessment Cervical / Trunk Assessment: Kyphotic   Communication Communication Communication: HOH   Cognition Arousal/Alertness:  Awake/alert Behavior During Therapy: WFL for tasks assessed/performed Overall Cognitive Status: Within Functional Limits for tasks assessed                                 General Comments: Pt HOH so requiring increased time for understanding commands but able to follow, does better with visual cues     General Comments  BP in supine 130/65, HR 87. In sitting 121/71, HR 135, recovering with a seated rest break. In standing 125/59, HR 129. Noted HR increasing to 135 and briefly 140 with mobility, improving with seated rest breaks. Pt on 3L O2 [place] throughout session, brief desat to 87% after side stepping, recovering with pursed lip breathing and seated rest break    Exercises     Shoulder Instructions      Home Living Family/patient expects to be discharged to:: Private residence Living Arrangements: Spouse/significant other Available Help at Discharge: Family;Available 24 hours/day Type of Home: House Home Access: Ramped entrance     Home Layout: One level     Bathroom Shower/Tub: Chief Strategy Officer: Handicapped height Bathroom Accessibility: No   Home Equipment: Rollator (4 wheels);Rolling Walker (2 wheels);Tub bench;BSC/3in1;Hand held shower head   Additional Comments: Pt wears 3L at baseline. Has concentrator at home      Prior Functioning/Environment Prior Level of Function : Needs assist             Mobility Comments: at SNF prior to admission, working on [employer] with RW, at baseline pt utilizes rollator or no AD ADLs Comments: at SNF prior to admission. Pt previously requiring Supervision for toileting and Min assist for LB dressing and LB bathing.        OT Problem List: Decreased strength;Decreased activity tolerance;Impaired balance (sitting and/or standing)      OT Treatment/Interventions: Self-care/ADL training;Therapeutic exercise;Energy conservation;DME  and/or AE instruction;Therapeutic activities;Patient/family  education;Balance training    OT Goals(Current goals can be found in the care plan section) Acute Rehab OT Goals Patient Stated Goal: To return home OT Goal Formulation: With patient/family Time For Goal Achievement: 08/18/22 Potential to Achieve Goals: Good ADL Goals Pt Will Perform Grooming: standing;with supervision Pt Will Perform Lower Body Bathing: with min assist;sit to/from stand Pt Will Perform Lower Body Dressing: with min assist;sit to/from stand Pt Will Transfer to Toilet: with modified independence;ambulating;bedside commode Pt Will Perform Toileting - Clothing Manipulation and hygiene: with supervision;sit to/from stand  OT Frequency: Min 2X/week    Co-evaluation              AM-PAC OT "6 Clicks" Daily Activity     Outcome Measure Help from another person eating meals?: A Little Help from another person taking care of personal grooming?: A Little Help from another person toileting, which includes using toliet, bedpan, or urinal?: A Lot Help from another person bathing (including washing, rinsing, drying)?: A Lot Help from another person to put on and taking off regular upper body clothing?: A Little Help from another person to put on and taking off regular lower body clothing?: A Lot 6 Click Score: 15   End of Session Equipment Utilized During Treatment: Gait belt;Rolling walker (2 wheels);Oxygen Nurse Communication: Mobility status;Other (comment) (Vital signs)  Activity Tolerance: Patient tolerated treatment well Patient left: in bed;with call bell/phone within reach;with bed alarm set;with family/visitor present  OT Visit Diagnosis: Unsteadiness on feet (R26.81);Other abnormalities of gait and mobility (R26.89);Muscle weakness (generalized) (M62.81)                Time: 1308-6578 OT Time Calculation (min): 27 min Charges:  OT General Charges $OT Visit: 1 Visit OT Evaluation $OT Eval Moderate Complexity: 1 Mod  [address] OTR/L, MA Acute  Rehab [phone]   Lendon Colonel 08/04/2022, 5:59 PM

## 2022-08-04 NOTE — Evaluation (Signed)
Physical Therapy Evaluation Patient Details Name: Gerald Valdez MRN: [id number] DOB: Jul 27, 1928 Today's Date: 08/04/2022  History of Present Illness  89 y/o M admitted to [facility] on 6/23 for concern of seizure. CT and MRI of brain and EEG all unremarkable. PMHx: bladder cancer on chronic Foley, chronic hypoxic and hypercarbic respiratory failure on 3L O2, COPD, HFpEF, HTN, HLD, chronic anemia, CKD, a-fib on Eliquis  Clinical Impression  Pt presented today with impaired functional mobility, limited by generalized weakness, decreased activity tolerance, and impaired balance. Pt was recently at subacute PT for therapy, family reporting he had been progressing with transfers and ambulating briefly with a RW, utilizes a rollator at baseline or no AD. Pt's family reports the plan is to take pt home upon discharge as family will be able to provide full time care and are comfortable with his mobility level. Pt limited during today's session by elevated HR and mild desat with O2, pt recovering well with seated rest breaks. Pt will continue to benefit from skilled acute PT to progress mobility, strength, balance, and activity tolerance, recommend HHPT upon discharge. Will continue to follow as appropriate.        Recommendations for follow up therapy are one component of a multi-disciplinary discharge planning process, led by the attending physician.  Recommendations may be updated based on patient status, additional functional criteria and insurance authorization.  Follow Up Recommendations       Assistance Recommended at Discharge Frequent or constant Supervision/Assistance  Patient can return home with the following  A little help with walking and/or transfers;A little help with bathing/dressing/bathroom;Assistance with cooking/housework;Assist for transportation;Help with stairs or ramp for entrance    Equipment Recommendations None recommended by PT  Recommendations for Other Services        Functional Status Assessment Patient has had a recent decline in their functional status and demonstrates the ability to make significant improvements in function in a reasonable and predictable amount of time.     Precautions / Restrictions Precautions Precautions: Fall;Other (comment) Precaution Comments: watch HR and O2 Restrictions Weight Bearing Restrictions: No      Mobility  Bed Mobility Overal bed mobility: Needs Assistance Bed Mobility: Supine to Sit, Sit to Supine     Supine to sit: Supervision, HOB elevated Sit to supine: Supervision, HOB elevated   General bed mobility comments: close supervision for safety and line management    Transfers Overall transfer level: Needs assistance Equipment used: Rolling walker (2 wheels) Transfers: Sit to/from Stand Sit to Stand: Min assist           General transfer comment: minA to power up and steady, cueing for proper hand placement    Ambulation/Gait Ambulation/Gait assistance: Min assist Gait Distance (Feet): 2 Feet Assistive device: Rolling walker (2 wheels) Gait Pattern/deviations: Step-to pattern, Decreased stride length, Trunk flexed Gait velocity: decreased     General Gait Details: side stepping at EOB, limited due to elevated HR. MinA provided for balance and safety  Stairs            Wheelchair Mobility    Modified Rankin (Stroke Patients Only)       Balance Overall balance assessment: Needs assistance Sitting-balance support: Bilateral upper extremity supported, Feet supported Sitting balance-Leahy Scale: Fair     Standing balance support: Bilateral upper extremity supported, During functional activity, Reliant on assistive device for balance Standing balance-Leahy Scale: Poor Standing balance comment: reliant on RW  Pertinent Vitals/Pain Pain Assessment Pain Assessment: No/denies pain    Home Living Family/patient expects to be discharged  to:: Private residence Living Arrangements: Spouse/significant other Available Help at Discharge: Family;Available 24 hours/day Type of Home: House Home Access: Ramped entrance       Home Layout: One level Home Equipment: Rollator (4 wheels);Rolling Walker (2 wheels);Tub bench;BSC/3in1;Hand held shower head (Reports they will borrow a wheelchair from a friend, declining one upon discharge currently) Additional Comments: Pt wears 3L at baseline. Has concentrator at home    Prior Function Prior Level of Function : Needs assist             Mobility Comments: at SNF prior to admission, working on [employer] with RW, at baseline pt utilizes rollator or no AD ADLs Comments: supervision for toileting, Min assist for dressing and bathing     Hand Dominance   Dominant Hand: Right    Extremity/Trunk Assessment   Upper Extremity Assessment Upper Extremity Assessment: Defer to OT evaluation    Lower Extremity Assessment Lower Extremity Assessment: Generalized weakness    Cervical / Trunk Assessment Cervical / Trunk Assessment: Kyphotic  Communication   Communication: HOH  Cognition Arousal/Alertness: Awake/alert Behavior During Therapy: WFL for tasks assessed/performed Overall Cognitive Status: Within Functional Limits for tasks assessed                                 General Comments: Pt HOH so requiring increased time for understanding commands but able to follow, does better with visual cues        General Comments General comments (skin integrity, edema, etc.): BP in supine 130/65, HR 87. In sitting 121/71, HR 135, recovering with a seated rest break. In standing 125/59, HR 129. Noted HR increasing to 135 and briefly 140 with mobility, improving with seated rest breaks. Pt on 3L O2 Frank throughout session, brief desat to 87% after side stepping, recovering with pursed lip breathing and seated rest break    Exercises     Assessment/Plan    PT Assessment  Patient needs continued PT services  PT Problem List Decreased strength;Decreased activity tolerance;Decreased balance;Decreased mobility;Decreased safety awareness;Decreased knowledge of precautions;Cardiopulmonary status limiting activity       PT Treatment Interventions DME instruction;Gait training;Functional mobility training;Therapeutic activities;Therapeutic exercise;Balance training;Neuromuscular re-education;Patient/family education    PT Goals (Current goals can be found in the Care Plan section)  Acute Rehab PT Goals Patient Stated Goal: go home PT Goal Formulation: With patient/family Time For Goal Achievement: 08/18/22 Potential to Achieve Goals: Fair    Frequency Min 3X/week     Co-evaluation               AM-PAC PT "6 Clicks" Mobility  Outcome Measure Help needed turning from your back to your side while in a flat bed without using bedrails?: A Little Help needed moving from lying on your back to sitting on the side of a flat bed without using bedrails?: A Little Help needed moving to and from a bed to a chair (including a wheelchair)?: A Little Help needed standing up from a chair using your arms (e.g., wheelchair or bedside chair)?: A Little Help needed to walk in hospital room?: A Lot Help needed climbing 3-5 steps with a railing? : A Lot 6 Click Score: 16    End of Session Equipment Utilized During Treatment: Gait belt;Oxygen Activity Tolerance: Patient limited by fatigue Patient left: in bed;with call bell/phone within  reach;with bed alarm set;with family/visitor present Nurse Communication: Mobility status PT Visit Diagnosis: Other abnormalities of gait and mobility (R26.89);Muscle weakness (generalized) (M62.81);Difficulty in walking, not elsewhere classified (R26.2)    Time: 1610-9604 PT Time Calculation (min) (ACUTE ONLY): 27 min   Charges:   PT Evaluation $PT Eval Moderate Complexity: 1 Mod          Lindalou Hose, PT DPT Acute Rehabilitation  Services Office [phone]   Leonie Man 08/04/2022, 4:50 PM

## 2022-08-04 NOTE — Progress Notes (Signed)
Progress Note    MILLEDGE GERDING  MVH:[id number] DOB: 29-Nov-1928  DOA: 08/01/2022 PCP: [name], MD      Brief Narrative:    Medical records reviewed and are as summarized below:  Gerald Valdez is a 89 y.o. male history significant of bladder cancer on chronic Foley, chronic hypoxic and hypercarbic respiratory failure on 3 to oxygen, COPD, HFpEF EF 40 to 45%, hypertension, hyperlipidemia, chronic anemia, CKD stage 3A and atrial fibrillation on Eliquis 2.5 twice daily presented to emergency department from nursing home with concern for seizure.    In the ER workup consistent with hypercarbic respiratory failure, UTI, possible myoclonic jerks, had CT brain, MRI brain and EEG which were unremarkable.  He was initially placed on BiPAP weaned off of BiPAP.       Assessment/Plan:   Principal Problem:   Acute hypoxic on chronic hypercapnic respiratory failure (HCC) Active Problems:   Acute metabolic encephalopathy   Acute hemorrhagic cystitis   History of bladder cancer on chronic Foley   Chronic systolic CHF preserved EF 40 to 45% (congestive heart failure) (HCC)   H/O atrial flutter   Essential hypertension   Hyperlipidemia   CKD stage 3a, GFR 45-59 ml/min (HCC)   GERD (gastroesophageal reflux disease)   DNR (do not resuscitate)    Body mass index is 21.35 kg/m.   Diffuse upper extremity myoclonic jerks.  Less likely seizures, brought on by combination of hypercarbic, hypoxic respiratory failure and UTI. Case discussed with Dr. Amada Jupiter neurologist in detail on 08/03/2022, imaging and EEG reviewed with him, patient less likely had seizure it was more consistent with myoclonic jerks in the setting of hypoxia, hypercarbia and UTI.  No AEDs are required, CT head, MRI brain and EEG unremarkable.  Continue Rocephin, gentle hydration, oxygen supplementation, he is currently liberated off of BiPAP, will be monitored off of it.  Advance activity, PT OT, wife now wants  to take him home once he is stable for discharge.     Acute on chronic hypoxic and hypercarbic respiratory failure.  Due to COPD on 3 L nasal cannula oxygen at all times.  Likely had mild COPD exacerbation, required BiPAP initially, now off of BiPAP and doing well on 3 L nasal cannula oxygen, no wheezing, low-dose steroids, continue antibiotic for UTI, no signs of active infection.  Continue to monitor.  Of note he recently had pneumonia in the right lower lobe from which she is recovering, BiPAP on a scheduled basis on him is not a good clinical thought as he is high risk for aspiration and has already aspirated once recently, has extremely poor cough reflex and extremely failed.  BiPAP on him should be used as a last resort.   Acute metabolic encephalopathy - due to combination of UTI and respiratory failure above, resolved, monitor, minimize benzodiazepines and narcotics.   Acute hemorrhagic cystitis - with sepsis present on admission, continue IV ceftriaxone.  No growth on urine cultures.   History of bladder cancer on chronic Foley, recent hematuria with Eliquis- Patient had foley cathter replaced at the nursing home. He has been referred to Oncology for treatment.  Follows with Dr. Retta Diones.   Chronic systolic CHF preserved EF 40 to 45% (congestive heart failure) (HCC) - hold further IV fluids, currently compensated, gentle Lasix x 1 on 08/03/2022 and monitor. Lasix as needed.   Paroxysmal atrial fibrillation/atrial flutter.  CHA2DS2-VASc score is  Heart rate is still elevated intermittently. Continue metoprolol.  Eliquis on hold  because of hematuria. No plans for cardioversion. Discontinue aspirin because of persistent hematuria   Moderate PCM.  Protein supplementation.    Other comorbidities include CKD stage IIIa, hypertension, GERD, hyperlipidemia    Family is open to pursuing comfort measures if patient declines.        Diet Order             Diet regular Room service  appropriate? Yes; Fluid consistency: Thin  Diet effective now                            Consultants: Cardiologist  Procedures: None    Medications:    aspirin  81 mg Oral Daily   escitalopram  10 mg Oral Daily   feeding supplement  237 mL Oral BID BM   metoprolol tartrate  12.5 mg Oral Once   metoprolol tartrate  37.5 mg Oral BID   pantoprazole  40 mg Oral BID AC   polyethylene glycol  17 g Oral Daily   saccharomyces boulardii  250 mg Oral BID   tamsulosin  0.4 mg Oral QPC supper   umeclidinium-vilanterol  1 puff Inhalation Daily   Continuous Infusions:  cefTRIAXone (ROCEPHIN)  IV 1 g (08/04/22 1030)   thiamine (VITAMIN B1) injection 500 mg (08/03/22 1133)     Anti-infectives (From admission, onward)    Start     Dose/Rate Route Frequency Ordered Stop   08/02/22 1000  cefTRIAXone (ROCEPHIN) 1 g in sodium chloride 0.9 % 100 mL IVPB        1 g 200 mL/hr over 30 Minutes Intravenous Every 24 hours 08/02/22 0517 08/07/22 0959   08/01/22 2015  cefTRIAXone (ROCEPHIN) 1 g in sodium chloride 0.9 % 100 mL IVPB        1 g 200 mL/hr over 30 Minutes Intravenous  Once 08/01/22 2009 08/01/22 2359              Family Communication/Anticipated D/C date and plan/Code Status   DVT prophylaxis: Place and maintain sequential compression device Start: 08/03/22 1050 SCDs Start: 08/02/22 0438     Code Status: DNR  Family Communication: Plan discussed with the family (wife and daughter) at the bedside. Disposition Plan: Plan to discharge to SNF   Status is: Inpatient Remains inpatient appropriate because: Atrial flutter with RVR, UTI       Subjective:   Interval events noted.  He has no complaints.  He said "I feel good for an old man".  His wife and daughter [name]) were at the bedside.  Objective:    Vitals:   08/04/22 0500 08/04/22 0800 08/04/22 0829 08/04/22 0955  BP:  114/67    Pulse:   (!) 128   Resp:  20 20   Temp:  97.8 F (36.6 C)     TempSrc:  Oral    SpO2:   92% 90%  Weight: 60 kg      Orthostatic VS for the past 24 hrs:  BP- Lying Pulse- Lying BP- Sitting Pulse- Sitting BP- Standing at 0 minutes Pulse- Standing at 0 minutes  08/04/22 0955 130/65 87 121/71 135 125/59 129     Intake/Output Summary (Last 24 hours) at 08/04/2022 1200 Last data filed at 08/04/2022 0920 Gross per 24 hour  Intake --  Output 1500 ml  Net -1500 ml   Filed Weights   08/03/22 0400 08/04/22 0500  Weight: 59.8 kg 60 kg    Exam:  GEN: NAD  SKIN: Warm and dry EYES: EOMI ENT: MMM CV: RRR, tachycardic PULM: CTA B ABD: soft, ND, NT, +BS CNS: AAO x 1 person), non focal EXT: No edema or tenderness GU: Foley catheter draining bloody urine       Data Reviewed:   I have personally reviewed following labs and imaging studies:  Labs: Labs show the following:   Basic Metabolic Panel: Recent Labs  Lab 08/01/22 1354 08/01/22 1403 08/02/22 0502 08/02/22 0555 08/02/22 0933 08/03/22 0232 08/03/22 0604 08/04/22 0312  NA 134*   < > 132* 131* 136 137  --  134*  K 5.0   < > 4.9 5.1 4.1 4.4  --  4.4  CL 91*  --   --  93*  --  94*  --  94*  CO2 31  --   --  29  --  35*  --  29  GLUCOSE 114*  --   --  73  --  106*  --  93  BUN 16  --   --  15  --  13  --  15  CREATININE 1.12  --   --  1.04  --  1.11  --  1.36*  CALCIUM 8.9  --   --  8.5*  --  8.5*  --  8.2*  MG  --   --   --   --   --   --  1.7 2.4   < > = values in this interval not displayed.   GFR Estimated Creatinine Clearance: 30 mL/min (A) (by C-G formula based on SCr of 1.36 mg/dL (H)). Liver Function Tests: Recent Labs  Lab 08/02/22 0555  AST 33  ALT 35  ALKPHOS 32*  BILITOT 0.6  PROT 5.2*  ALBUMIN 3.0*   No results for input(s): "LIPASE", "AMYLASE" in the last 168 hours. Recent Labs  Lab 08/03/22 0604  AMMONIA <10   Coagulation profile No results for input(s): "INR", "PROTIME" in the last 168 hours.  CBC: Recent Labs  Lab 08/01/22 1354  08/01/22 1403 08/02/22 0129 08/02/22 0502 08/02/22 0933 08/03/22 0232 08/04/22 0312  WBC 10.2  --   --   --   --  8.1 8.9  NEUTROABS  --   --   --   --   --   --  6.0  HGB 9.3*   < > 9.9* 10.2* 8.8* 9.2* 9.2*  HCT 31.0*   < > 29.0* 30.0* 26.0* 30.2* 29.6*  MCV 96.9  --   --   --   --  95.6 96.4  PLT 207  --   --   --   --  194 181   < > = values in this interval not displayed.   Cardiac Enzymes: No results for input(s): "CKTOTAL", "CKMB", "CKMBINDEX", "TROPONINI" in the last 168 hours. BNP (last 3 results) No results for input(s): "PROBNP" in the last 8760 hours. CBG: Recent Labs  Lab 08/01/22 1345  GLUCAP 99   D-Dimer: No results for input(s): "DDIMER" in the last 72 hours. Hgb A1c: No results for input(s): "HGBA1C" in the last 72 hours. Lipid Profile: No results for input(s): "CHOL", "HDL", "LDLCALC", "TRIG", "CHOLHDL", "LDLDIRECT" in the last 72 hours. Thyroid function studies: Recent Labs    08/03/22 0604  TSH 4.079   Anemia work up: No results for input(s): "VITAMINB12", "FOLATE", "FERRITIN", "TIBC", "IRON", "RETICCTPCT" in the last 72 hours. Sepsis Labs: Recent Labs  Lab 08/01/22 1354 08/03/22 6644 08/03/22 0604 08/04/22 0347  PROCALCITON  --   --  <0.10 <0.10  WBC 10.2 8.1  --  8.9    Microbiology Recent Results (from the past 240 hour(s))  Urine Culture     Status: None   Collection Time: 08/01/22  8:10 PM   Specimen: Urine, Clean Catch  Result Value Ref Range Status   Specimen Description URINE, CLEAN CATCH  Final   Special Requests NONE  Final   Culture   Final    NO GROWTH Performed at [facility], [address] [place], [place] [address]    Report Status 08/03/2022 FINAL  Final    Procedures and diagnostic studies:  DG Chest Port 1 View  Result Date: 08/03/2022 CLINICAL DATA:  89 year old male with history of shortness of breath. EXAM: PORTABLE CHEST 1 VIEW COMPARISON:  Chest x-ray 08/31/2022. FINDINGS: Poorly defined opacity  in the right lung base partially obscuring the right hemidiaphragm, which may reflect some airspace consolidation and/or presence of a small right pleural effusion. Left lung is clear. No left pleural effusion. No pneumothorax. No evidence of pulmonary edema. Heart size is normal. Upper mediastinal contours are within normal limits. Atherosclerotic calcifications are noted in the thoracic aorta. IMPRESSION: 1. New opacity in the right lung base partially obscuring the right hemidiaphragm concerning for developing right lower lobe airspace consolidation with small superimposed right pleural effusion. 2. Aortic atherosclerosis. Electronically Signed   By: Trudie Reed M.D.   On: 08/03/2022 06:16   EEG adult  Result Date: 08/02/2022 Charlsie Quest, MD     08/02/2022  1:51 PM Patient Name: DREYDEN ROHRMAN MRN: [id number] Epilepsy Attending: Charlsie Quest Referring Physician/Provider: Tereasa Coop, MD Date: 08/02/2022 Duration: 24.47 mins Patient history: 89yo M with 1 episode of seizure-like activity at nursing home and en route to EMS for seizure-like episode. EMS reported bilateral hand tremor. EEG to evaluate for seizure. Level of alertness: Awake, asleep AEDs during EEG study: None Technical aspects: This EEG study was done with scalp electrodes positioned according to the 10-20 International system of electrode placement. Electrical activity was reviewed with band pass filter of 1-70Hz , sensitivity of 7 uV/mm, display speed of 66mm/sec with a 60Hz  notched filter applied as appropriate. EEG data were recorded continuously and digitally stored.  Video monitoring was available and reviewed as appropriate. Description: No posterior dominant rhythm was seen. Sleep was characterized by vertex waves, sleep spindles (12 to 14 Hz), maximal frontocentral region. EEG showed continuous generalized 3 to 6 Hz theta-delta slowing. Hyperventilation and photic stimulation were not performed.   ABNORMALITY - Continuous  slow, generalized IMPRESSION: This study is suggestive of moderate diffuse encephalopathy, nonspecific etiology. No seizures or epileptiform discharges were seen throughout the recording. Priyanka Annabelle Harman               LOS: 2 days   Latima Hamza  [facility]   Pager on [email or web address]. If 7PM-7AM, please contact night-coverage at [email or web address]     08/04/2022, 12:00 PM

## 2022-08-04 NOTE — Progress Notes (Signed)
Progress Note  Patient Name: Gerald Valdez Date of Encounter: 08/04/2022 Primary Cardiologist: Kristeen Miss, MD   Subjective   Overnight improvement in heart rate and symptoms. Hs troponin confirm suspicions of demand EKG changes without STEMI. Patient notes no symptoms with return to RVR  Vital Signs    Vitals:   08/03/22 2200 08/04/22 0000 08/04/22 0400 08/04/22 0500  BP: 108/66 121/66 112/63   Pulse: 74 89 88   Resp: 16 20 19    Temp:  97.6 F (36.4 C) 97.6 F (36.4 C)   TempSrc:  Oral Oral   SpO2: 98% 95% 95%   Weight:    60 kg   No intake or output data in the 24 hours ending 08/04/22 0711 Filed Weights   08/03/22 0400 08/04/22 0500  Weight: 59.8 kg 60 kg    Physical Exam   GEN: No acute distress.  Chronically ill appearing Neck: No JVD Cardiac: IRIR tachycardia, no murmurs, rubs, or gallops.  Respiratory: Clear to auscultation bilaterally. GI: Soft, nontender, non-distended  MS: No edema  Labs   Telemetry: SR with paroxysm of PAF   Chemistry Recent Labs  Lab 08/02/22 0555 08/02/22 0933 08/03/22 0232 08/04/22 0312  NA 131* 136 137 134*  K 5.1 4.1 4.4 4.4  CL 93*  --  94* 94*  CO2 29  --  35* 29  GLUCOSE 73  --  106* 93  BUN 15  --  13 15  CREATININE 1.04  --  1.11 1.36*  CALCIUM 8.5*  --  8.5* 8.2*  PROT 5.2*  --   --   --   ALBUMIN 3.0*  --   --   --   AST 33  --   --   --   ALT 35  --   --   --   ALKPHOS 32*  --   --   --   BILITOT 0.6  --   --   --   GFRNONAA >60  --  >60 49*  ANIONGAP 9  --  8 11     Hematology Recent Labs  Lab 08/01/22 1354 08/01/22 1403 08/02/22 0933 08/03/22 0232 08/04/22 0312  WBC 10.2  --   --  8.1 8.9  RBC 3.20*  --   --  3.16* 3.07*  HGB 9.3*   < > 8.8* 9.2* 9.2*  HCT 31.0*   < > 26.0* 30.2* 29.6*  MCV 96.9  --   --  95.6 96.4  MCH 29.1  --   --  29.1 30.0  MCHC 30.0  --   --  30.5 31.1  RDW 13.2  --   --  13.2 13.2  PLT 207  --   --  194 181   < > = values in this interval not displayed.     Cardiac EnzymesNo results for input(s): "TROPONINI" in the last 168 hours. No results for input(s): "TROPIPOC" in the last 168 hours.   BNP Recent Labs  Lab 08/03/22 0604 08/04/22 0312  BNP 509.5* 59.8     DDimer No results for input(s): "DDIMER" in the last 168 hours.   Cardiac Studies   Cardiac Studies & Procedures       ECHOCARDIOGRAM  ECHOCARDIOGRAM COMPLETE 07/21/2022  Narrative ECHOCARDIOGRAM REPORT    Patient Name:   Gerald Valdez Date of Exam: 07/21/2022 Medical Rec #:  [id number]       Height:       66.0 in Accession #:  [id number]      Weight:       145.9 lb Date of Birth:  1928-08-17       BSA:          1.749 m Patient Age:    89 years        BP:           108/53 mmHg Patient Gender: M               HR:           97 bpm. Exam Location:  Inpatient  Procedure: 2D Echo, Cardiac Doppler and Color Doppler  Indications:    CHF-Acute Diastolic I50.31  History:        Patient has prior history of Echocardiogram examinations, most recent 02/11/2020. CKD 3 and COPD; Risk Factors:Hypertension, Dyslipidemia and Former Smoker.  Sonographer:    Dondra Prader RVT RCS Referring Phys: [id number] EMILY B MULLEN   Sonographer Comments: Technically challenging study due to limited acoustic windows, Technically difficult study due to poor echo windows, suboptimal parasternal window, suboptimal apical window and suboptimal subcostal window. Image acquisition challenging due to respiratory motion, Image acquisition challenging due to patient body habitus and Image acquisition challenging due to uncooperative patient. IMPRESSIONS   1. Left ventricular ejection fraction, by estimation, is 45 to 50%. The left ventricle has mildly decreased function. The left ventricle has no regional wall motion abnormalities. There is mild left ventricular hypertrophy. Left ventricular diastolic parameters are indeterminate. 2. Right ventricular systolic function is normal. The right ventricular  size is mildly enlarged. 3. Right atrial size was mildly dilated. 4. A small pericardial effusion is present. The pericardial effusion is anterior to the right ventricle. There is no evidence of cardiac tamponade. 5. The mitral valve is degenerative. Mild to moderate mitral valve regurgitation. 6. The aortic valve was not well visualized. Aortic valve regurgitation is not visualized.  Comparison(s): Prior images reviewed side by side. LVEF appears slightly decreased; this study is more technically difficult than prior.  FINDINGS Left Ventricle: LVOT calcification unchanged from prior. Left ventricular ejection fraction, by estimation, is 45 to 50%. The left ventricle has mildly decreased function. The left ventricle has no regional wall motion abnormalities. The left ventricular internal cavity size was normal in size. There is mild left ventricular hypertrophy. Left ventricular diastolic parameters are indeterminate.  Right Ventricle: The right ventricular size is mildly enlarged. No increase in right ventricular wall thickness. Right ventricular systolic function is normal.  Left Atrium: Left atrial size was normal in size.  Right Atrium: Right atrial size was mildly dilated.  Pericardium: A small pericardial effusion is present. The pericardial effusion is anterior to the right ventricle. There is no evidence of cardiac tamponade.  Mitral Valve: The mitral valve is degenerative in appearance. Mild to moderate mitral valve regurgitation.  Tricuspid Valve: The tricuspid valve is normal in structure. Tricuspid valve regurgitation is mild . No evidence of tricuspid stenosis.  Aortic Valve: The aortic valve was not well visualized. Aortic valve regurgitation is not visualized. Aortic valve mean gradient measures 4.0 mmHg. Aortic valve peak gradient measures 6.7 mmHg. Aortic valve area, by VTI measures 1.77 cm.  Pulmonic Valve: The pulmonic valve was not well visualized. Pulmonic valve  regurgitation is not visualized.  Aorta: The aortic root is normal in size and structure and the ascending aorta was not well visualized.  IAS/Shunts: No atrial level shunt detected by color flow Doppler.   LEFT VENTRICLE PLAX 2D LVIDd:  4.20 cm   Diastology LVIDs:         3.30 cm   LV e' medial:    6.50 cm/s LV PW:         1.10 cm   LV E/e' medial:  10.7 LV IVS:        0.90 cm   LV e' lateral:   8.18 cm/s LVOT diam:     1.90 cm   LV E/e' lateral: 8.5 LV SV:         38 LV SV Index:   22 LVOT Area:     2.84 cm   RIGHT VENTRICLE             IVC RV Basal diam:  3.90 cm     IVC diam: 2.50 cm RV S prime:     11.70 cm/s TAPSE (M-mode): 1.7 cm  LEFT ATRIUM           Index        RIGHT ATRIUM           Index LA diam:      3.90 cm 2.23 cm/m   RA Area:     18.20 cm LA Vol (A2C): 48.6 ml 27.79 ml/m  RA Volume:   56.90 ml  32.53 ml/m LA Vol (A4C): 37.5 ml 21.44 ml/m AORTIC VALVE AV Area (Vmax):    1.62 cm AV Area (Vmean):   1.57 cm AV Area (VTI):     1.77 cm AV Vmax:           129.00 cm/s AV Vmean:          88.200 cm/s AV VTI:            0.216 m AV Peak Grad:      6.7 mmHg AV Mean Grad:      4.0 mmHg LVOT Vmax:         73.70 cm/s LVOT Vmean:        48.800 cm/s LVOT VTI:          0.135 m LVOT/AV VTI ratio: 0.62  AORTA Ao Root diam: 3.40 cm  MITRAL VALVE               TRICUSPID VALVE MV Area (PHT): 4.67 cm    TR Peak grad:   30.2 mmHg MV Decel Time: 163 msec    TR Vmax:        275.00 cm/s MV E velocity: 69.85 cm/s MV A velocity: 61.70 cm/s  SHUNTS MV E/A ratio:  1.13        Systemic VTI:  0.14 m Systemic Diam: 1.90 cm  Riley Lam MD Electronically signed by Riley Lam MD Signature Date/Time: 07/21/2022/5:42:19 PM    Final             Assessment & Plan   Paroxysmal Atrial Fibrillation - currently in AF RVR - he is asymptomatic at higher rates and goal s of care are to be conservative; he will be going home and not back to  rehab - ASA is reasonable (cannot tolerate DOAC due to bleeding) but if no improvement in bladder cancer associated hemorrhagic cystitis and stop ASA - continue metoprolol at higher dose (we have increased further today) - if BP does not allow metoprolol dosing and he has return of AF RVR, we will start amiodarone 400 mg PO BID for 10 doses then 200 mg PO daily; this is essentially just treating a number and would not recommend this unless symptoms or if this  would keep him from placement - No plans for DCCV at this time- patient is unable to tolerate anticoagulation due to hematuria    Coronary Calcifications - Patient is 89 years old and DNR- per discussion with family, plan to continue medications that improve his quality of life in the short term and stop medications that are more for risk reduction. Does not need statin at discharge   Chronic systolic heart failure  - no plans to titrate GDMT due to the above GOC   For questions or updates, please contact [facility] Please consult [email or web address] for contact info under Cardiology/STEMI.      Riley Lam, MD FASE [facility] Cardiologist [facility]  [address] [place], [address] [place], [place] [address] [phone]  7:11 AM

## 2022-08-05 DIAGNOSIS — J9601 Acute respiratory failure with hypoxia: Secondary | ICD-10-CM | POA: Diagnosis not present

## 2022-08-05 DIAGNOSIS — J9612 Chronic respiratory failure with hypercapnia: Secondary | ICD-10-CM | POA: Diagnosis not present

## 2022-08-05 DIAGNOSIS — N3001 Acute cystitis with hematuria: Secondary | ICD-10-CM | POA: Diagnosis not present

## 2022-08-05 LAB — BASIC METABOLIC PANEL
Anion gap: 7 (ref 5–15)
BUN: 18 mg/dL (ref 8–23)
CO2: 32 mmol/L (ref 22–32)
Calcium: 8.1 mg/dL — ABNORMAL LOW (ref 8.9–10.3)
Chloride: 95 mmol/L — ABNORMAL LOW (ref 98–111)
Creatinine, Ser: 1.25 mg/dL — ABNORMAL HIGH (ref 0.61–1.24)
GFR, Estimated: 54 mL/min — ABNORMAL LOW (ref >60)
Glucose, Bld: 94 mg/dL (ref 70–99)
Potassium: 4.2 mmol/L (ref 3.5–5.1)
Sodium: 134 mmol/L — ABNORMAL LOW (ref 135–145)

## 2022-08-05 LAB — CBC WITH DIFFERENTIAL/PLATELET
Abs Immature Granulocytes: 0.05 10*3/uL (ref 0.00–0.07)
Basophils Absolute: 0 10*3/uL (ref 0.0–0.1)
Basophils Relative: 0 %
Eosinophils Absolute: 0.3 10*3/uL (ref 0.0–0.5)
Eosinophils Relative: 4 %
HCT: 28.3 % — ABNORMAL LOW (ref 39.0–52.0)
Hemoglobin: 8.8 g/dL — ABNORMAL LOW (ref 13.0–17.0)
Immature Granulocytes: 1 %
Lymphocytes Relative: 20 %
Lymphs Abs: 1.5 10*3/uL (ref 0.7–4.0)
MCH: 30.3 pg (ref 26.0–34.0)
MCHC: 31.1 g/dL (ref 30.0–36.0)
MCV: 97.6 fL (ref 80.0–100.0)
Monocytes Absolute: 0.8 10*3/uL (ref 0.1–1.0)
Monocytes Relative: 11 %
Neutro Abs: 4.8 10*3/uL (ref 1.7–7.7)
Neutrophils Relative %: 64 %
Platelets: 174 10*3/uL (ref 150–400)
RBC: 2.9 MIL/uL — ABNORMAL LOW (ref 4.22–5.81)
RDW: 13.2 % (ref 11.5–15.5)
WBC: 7.5 10*3/uL (ref 4.0–10.5)
nRBC: 0 % (ref 0.0–0.2)

## 2022-08-05 LAB — PROCALCITONIN: Procalcitonin: <0.1 ng/mL

## 2022-08-05 LAB — MAGNESIUM: Magnesium: 2.1 mg/dL (ref 1.7–2.4)

## 2022-08-05 LAB — C-REACTIVE PROTEIN: CRP: <0.5 mg/dL (ref <1.0)

## 2022-08-05 LAB — BRAIN NATRIURETIC PEPTIDE: B Natriuretic Peptide: 595.5 pg/mL — ABNORMAL HIGH (ref 0.0–100.0)

## 2022-08-05 LAB — GLUCOSE, CAPILLARY: Glucose-Capillary: 164 mg/dL — ABNORMAL HIGH (ref 70–99)

## 2022-08-05 LAB — FERRITIN: Ferritin: 28 ng/mL (ref 24–336)

## 2022-08-05 MED ORDER — METOPROLOL SUCCINATE ER 50 MG PO TB24
75.0000 mg | ORAL_TABLET | Freq: Every day | ORAL | Status: DC
  Administered 2022-08-05 – 2022-08-06 (×2): 75 mg via ORAL
  Filled 2022-08-05 (×3): qty 1

## 2022-08-05 MED ORDER — SODIUM CHLORIDE 0.9 % IV SOLN
200.0000 mg | Freq: Once | INTRAVENOUS | Status: AC
  Administered 2022-08-05: 200 mg via INTRAVENOUS
  Filled 2022-08-05: qty 10

## 2022-08-05 NOTE — TOC Initial Note (Signed)
Transition of Care [facility]) - Initial/Assessment Note    Patient Details  Name: Gerald Valdez MRN: [id number] Date of Birth: [birth year]  Transition of Care [facility]) CM/SW Contact:    Gordy Clement, RN Phone Number: 08/05/2022, 9:38 AM  Clinical Narrative:      Patient admitted from SNF where he was staying for rehab since last DC.  Patient will return home with Wife and Home Health.  Bamboo shows that Patient has had services with [facility] in the past but apparently patient was set up with [facility] for after SNF dc. Daughter states they will go ahead with [facility]. Liaison notified . Patient owns the following DME:  rolling walker, rollator, bedside commode, tub bench.  Patient will borrow the recommended wheelchair from their Daughter. Home O2 3L at baseline provided by [employer].    TOC will continue to follow patient for any additional discharge needs       Expected Discharge Plan: Home w Home Health Services Barriers to Discharge: Continued Medical Work up   Patient Goals and CMS Choice Patient states their goals for this hospitalization and ongoing recovery are:: Go home CMS Medicare.gov Compare Post Acute Care list provided to:: Patient Choice offered to / list presented to : Patient      Expected Discharge Plan and Services In-house Referral: NA Discharge Planning Services: CM Consult Post Acute Care Choice: Home Health Living arrangements for the past 2 months: Skilled Nursing Facility (Was at skilled since last DC)                 DME Arranged: N/A (Patient already owns a RW/Rollator/tub bench/ BSC- they will borrow a wheelchair) DME Agency: NA       HH Arranged: PT, OT HH Agency: [facility] Date [facility] Agency Contacted: 08/05/22 Time HH Agency Contacted: [phone] Representative spoke with at [facility] Agency: Kandee Keen  Prior Living Arrangements/Services Living arrangements for the past 2 months: Skilled Holiday representative (Was at skilled since last DC) Lives with::  Spouse Patient language and need for interpreter reviewed:: Yes Do you feel safe going back to the place where you live?: Yes      Need for Family Participation in Patient Care: Yes (Comment) Care giver support system in place?: Yes (comment) Current home services: DME Criminal Activity/Legal Involvement Pertinent to Current Situation/Hospitalization: No - Comment as needed  Activities of Daily Living      Permission Sought/Granted Permission sought to share information with : Case Manager Permission granted to share information with : Yes, Verbal Permission Granted     Permission granted to share info w AGENCY: HH        Emotional Assessment Appearance:: Appears stated age Attitude/Demeanor/Rapport: Unable to Assess Affect (typically observed): Unable to Assess Orientation: : Oriented to Self, Oriented to Place, Oriented to  Time Alcohol / Substance Use: Not Applicable Psych Involvement: No (comment)  Admission diagnosis:  Encephalopathy [G93.40] Seizure-like activity (HCC) [R56.9] Acute metabolic encephalopathy [G93.41] Hematuria, unspecified type [R31.9] Patient Active Problem List   Diagnosis Date Noted   Acute metabolic encephalopathy 08/02/2022   Seizure like activity (HCC) 08/02/2022   Acute hemorrhagic cystitis 08/02/2022   History of bladder cancer on chronic Foley 08/02/2022   Chronic systolic CHF preserved EF 40 to 45% (congestive heart failure) (HCC) 08/02/2022   DNR (do not resuscitate) 08/02/2022   H/O atrial flutter 08/02/2022   Atrial flutter with rapid ventricular response (HCC) 07/21/2022   Acute heart failure (HCC) 07/21/2022   Acute hypoxic on chronic  hypercapnic respiratory failure (HCC) 07/20/2022   Acute on chronic respiratory failure with hypoxia and hypercapnia (HCC) 07/19/2022   Malignant neoplasm of bladder wall (HCC) 05/27/2022   Hypokalemia 07/04/2021   Thrombocytopenia (HCC) 07/04/2021   Iron deficiency anemia 07/04/2021   Protein-calorie  malnutrition, severe 07/03/2021   Dehydration 07/01/2021   AKI (acute kidney injury) (HCC) 07/01/2021   CKD stage 3a, GFR 45-59 ml/min (HCC) 07/01/2021   Protein-calorie malnutrition, moderate (HCC) 07/01/2021   Diarrhea 07/01/2021   UTI (urinary tract infection) 07/01/2021   Sepsis (HCC) 07/01/2021   Chronic respiratory failure with hypoxia (HCC) 07/01/2021   Hyperglycemia 07/01/2021   Normocytic anemia 07/01/2021   Urinary retention 06/26/2021   Cancer of overlapping sites of bladder (HCC) 06/25/2021   Malignant neoplasm of overlapping sites of bladder (HCC) 11/20/2020   COPD (chronic obstructive pulmonary disease) (HCC) 02/10/2020   Aortic atherosclerosis (HCC) 02/10/2020   Dyspnea on exertion 02/10/2020   Essential hypertension 02/10/2020   Hyperlipidemia 02/10/2020   GERD (gastroesophageal reflux disease) 02/10/2020   GI bleed 01/30/2014   Acute post-hemorrhagic anemia 01/30/2014   Acute duodenal ulcer with bleeding 01/30/2014   PCP:  [name], MD Pharmacy:   [facility] - [place], [place] - [address] [place] [address] Phone: [phone] Fax: [phone]     Social Determinants of Health (SDOH) Social History: SDOH Screenings   Food Insecurity: No Food Insecurity (07/22/2022)  Housing: Low Risk  (07/22/2022)  Transportation Needs: No Transportation Needs (07/22/2022)  Utilities: Not At Risk (07/22/2022)  Tobacco Use: Medium Risk (08/02/2022)   SDOH Interventions:     Readmission Risk Interventions    07/27/2022    4:19 PM  Readmission Risk Prevention Plan  Transportation Screening Complete  PCP or Specialist Appt within 3-5 Days Complete  Social Work Consult for Recovery Care Planning/Counseling Complete  Palliative Care Screening Complete  Medication Review Oceanographer) Complete

## 2022-08-05 NOTE — Progress Notes (Signed)
Progress Note  Patient Name: Gerald Valdez Date of Encounter: 08/05/2022 Primary Cardiologist: Kristeen Miss, MD   Subjective   Overnight ASA held for persistent hematuria Patient notes that he feels fine.  Vital Signs    Vitals:   08/05/22 0400 08/05/22 0600 08/05/22 0758 08/05/22 0802  BP: 112/63 126/63  121/65  Pulse: 79 84  85  Resp: 19 (!) 23  (!) 22  Temp: 98.2 F (36.8 C)   98 F (36.7 C)  TempSrc: Axillary   Oral  SpO2: 97% 97% 97% 96%  Weight:        Intake/Output Summary (Last 24 hours) at 08/05/2022 0826 Last data filed at 08/04/2022 2124 Gross per 24 hour  Intake 1626.94 ml  Output 1750 ml  Net -123.06 ml   Filed Weights   08/03/22 0400 08/04/22 0500  Weight: 59.8 kg 60 kg    Physical Exam   GEN: No acute distress.  Chronically ill appearing Neck: No JVD Cardiac: IRIR rhythm, no murmurs, rubs, or gallops.  Respiratory: Expiratory wheeze with normal rate and effort GI: Soft, nontender, non-distended  MS: No edema  Labs   Telemetry: SR with paroxysms of atrial fibrillation   Chemistry Recent Labs  Lab 08/02/22 0555 08/02/22 0933 08/03/22 0232 08/04/22 0312 08/05/22 0545  NA 131*   < > 137 134* 134*  K 5.1   < > 4.4 4.4 4.2  CL 93*  --  94* 94* 95*  CO2 29  --  35* 29 32  GLUCOSE 73  --  106* 93 94  BUN 15  --  13 15 18   CREATININE 1.04  --  1.11 1.36* 1.25*  CALCIUM 8.5*  --  8.5* 8.2* 8.1*  PROT 5.2*  --   --   --   --   ALBUMIN 3.0*  --   --   --   --   AST 33  --   --   --   --   ALT 35  --   --   --   --   ALKPHOS 32*  --   --   --   --   BILITOT 0.6  --   --   --   --   GFRNONAA >60  --  >60 49* 54*  ANIONGAP 9  --  8 11 7    < > = values in this interval not displayed.     Hematology Recent Labs  Lab 08/03/22 0232 08/04/22 0312 08/05/22 0545  WBC 8.1 8.9 7.5  RBC 3.16* 3.07* 2.90*  HGB 9.2* 9.2* 8.8*  HCT 30.2* 29.6* 28.3*  MCV 95.6 96.4 97.6  MCH 29.1 30.0 30.3  MCHC 30.5 31.1 31.1  RDW 13.2 13.2 13.2  PLT  194 181 174    Cardiac EnzymesNo results for input(s): "TROPONINI" in the last 168 hours. No results for input(s): "TROPIPOC" in the last 168 hours.   BNP Recent Labs  Lab 08/03/22 0604 08/04/22 0312 08/05/22 0545  BNP 509.5* 59.8 595.5*     DDimer No results for input(s): "DDIMER" in the last 168 hours.   Cardiac Studies   Cardiac Studies & Procedures       ECHOCARDIOGRAM  ECHOCARDIOGRAM COMPLETE 07/21/2022  Narrative ECHOCARDIOGRAM REPORT    Patient Name:   FADIL MACMASTER Date of Exam: 07/21/2022 Medical Rec #:  [id number]       Height:       66.0 in Accession #:    [id number]  Weight:       145.9 lb Date of Birth:  October 19, 1928       BSA:          1.749 m Patient Age:    89 years        BP:           108/53 mmHg Patient Gender: M               HR:           97 bpm. Exam Location:  Inpatient  Procedure: 2D Echo, Cardiac Doppler and Color Doppler  Indications:    CHF-Acute Diastolic I50.31  History:        Patient has prior history of Echocardiogram examinations, most recent 02/11/2020. CKD 3 and COPD; Risk Factors:Hypertension, Dyslipidemia and Former Smoker.  Sonographer:    Dondra Prader RVT RCS Referring Phys: [id number] EMILY B MULLEN   Sonographer Comments: Technically challenging study due to limited acoustic windows, Technically difficult study due to poor echo windows, suboptimal parasternal window, suboptimal apical window and suboptimal subcostal window. Image acquisition challenging due to respiratory motion, Image acquisition challenging due to patient body habitus and Image acquisition challenging due to uncooperative patient. IMPRESSIONS   1. Left ventricular ejection fraction, by estimation, is 45 to 50%. The left ventricle has mildly decreased function. The left ventricle has no regional wall motion abnormalities. There is mild left ventricular hypertrophy. Left ventricular diastolic parameters are indeterminate. 2. Right ventricular systolic function  is normal. The right ventricular size is mildly enlarged. 3. Right atrial size was mildly dilated. 4. A small pericardial effusion is present. The pericardial effusion is anterior to the right ventricle. There is no evidence of cardiac tamponade. 5. The mitral valve is degenerative. Mild to moderate mitral valve regurgitation. 6. The aortic valve was not well visualized. Aortic valve regurgitation is not visualized.  Comparison(s): Prior images reviewed side by side. LVEF appears slightly decreased; this study is more technically difficult than prior.  FINDINGS Left Ventricle: LVOT calcification unchanged from prior. Left ventricular ejection fraction, by estimation, is 45 to 50%. The left ventricle has mildly decreased function. The left ventricle has no regional wall motion abnormalities. The left ventricular internal cavity size was normal in size. There is mild left ventricular hypertrophy. Left ventricular diastolic parameters are indeterminate.  Right Ventricle: The right ventricular size is mildly enlarged. No increase in right ventricular wall thickness. Right ventricular systolic function is normal.  Left Atrium: Left atrial size was normal in size.  Right Atrium: Right atrial size was mildly dilated.  Pericardium: A small pericardial effusion is present. The pericardial effusion is anterior to the right ventricle. There is no evidence of cardiac tamponade.  Mitral Valve: The mitral valve is degenerative in appearance. Mild to moderate mitral valve regurgitation.  Tricuspid Valve: The tricuspid valve is normal in structure. Tricuspid valve regurgitation is mild . No evidence of tricuspid stenosis.  Aortic Valve: The aortic valve was not well visualized. Aortic valve regurgitation is not visualized. Aortic valve mean gradient measures 4.0 mmHg. Aortic valve peak gradient measures 6.7 mmHg. Aortic valve area, by VTI measures 1.77 cm.  Pulmonic Valve: The pulmonic valve was not well  visualized. Pulmonic valve regurgitation is not visualized.  Aorta: The aortic root is normal in size and structure and the ascending aorta was not well visualized.  IAS/Shunts: No atrial level shunt detected by color flow Doppler.   LEFT VENTRICLE PLAX 2D LVIDd:  4.20 cm   Diastology LVIDs:         3.30 cm   LV e' medial:    6.50 cm/s LV PW:         1.10 cm   LV E/e' medial:  10.7 LV IVS:        0.90 cm   LV e' lateral:   8.18 cm/s LVOT diam:     1.90 cm   LV E/e' lateral: 8.5 LV SV:         38 LV SV Index:   22 LVOT Area:     2.84 cm   RIGHT VENTRICLE             IVC RV Basal diam:  3.90 cm     IVC diam: 2.50 cm RV S prime:     11.70 cm/s TAPSE (M-mode): 1.7 cm  LEFT ATRIUM           Index        RIGHT ATRIUM           Index LA diam:      3.90 cm 2.23 cm/m   RA Area:     18.20 cm LA Vol (A2C): 48.6 ml 27.79 ml/m  RA Volume:   56.90 ml  32.53 ml/m LA Vol (A4C): 37.5 ml 21.44 ml/m AORTIC VALVE AV Area (Vmax):    1.62 cm AV Area (Vmean):   1.57 cm AV Area (VTI):     1.77 cm AV Vmax:           129.00 cm/s AV Vmean:          88.200 cm/s AV VTI:            0.216 m AV Peak Grad:      6.7 mmHg AV Mean Grad:      4.0 mmHg LVOT Vmax:         73.70 cm/s LVOT Vmean:        48.800 cm/s LVOT VTI:          0.135 m LVOT/AV VTI ratio: 0.62  AORTA Ao Root diam: 3.40 cm  MITRAL VALVE               TRICUSPID VALVE MV Area (PHT): 4.67 cm    TR Peak grad:   30.2 mmHg MV Decel Time: 163 msec    TR Vmax:        275.00 cm/s MV E velocity: 69.85 cm/s MV A velocity: 61.70 cm/s  SHUNTS MV E/A ratio:  1.13        Systemic VTI:  0.14 m Systemic Diam: 1.90 cm  Riley Lam MD Electronically signed by Riley Lam MD Signature Date/Time: 07/21/2022/5:42:19 PM    Final             Assessment & Plan   Paroxysmal Atrial Fibrillation - currently in AF RVR - he is asymptomatic at higher rates and goal s of care are to be conservative - ASA stopped for  bleed, DOAC stopped for bleed - consolidation to succinate  today - No plans for DCCV likely ever- patient is unable to tolerate anticoagulation due to hematuria from his cancer  Coronary Calcifications - see initial consult note- planned conservative therapy, stop statin as DC   Chronic systolic heart failure  - no plans to titrate GDMT due to the above GOC  Signing off on  - 75 mg PO metoprolol succinate - keep 7/5 f/u visit; he can then follow up with me once in  November unless they would only like to be seen PRN  For questions or updates, please contact [facility] Please consult [email or web address] for contact info under Cardiology/STEMI.      Riley Lam, MD FASE [facility] Cardiologist [facility]  [address] [place], [address] [place], [place] [address] [phone]  8:26 AM

## 2022-08-05 NOTE — Progress Notes (Signed)
Cardiology follow-up arranged on 7/5 with APP, appt info outlined on AVS. Dr. Izora Ribas also reprots he sent a message to his nurse to arrange follow-up with him in November 2024, can verify date at follow-up visit.

## 2022-08-05 NOTE — Progress Notes (Addendum)
Speech Language Pathology Treatment: Dysphagia  Patient Details Name: Gerald Valdez MRN: [id number] DOB: 08/30/1928 Today's Date: 08/05/2022 Time: 0950-1008 SLP Time Calculation (min) (ACUTE ONLY): 18 min  Assessment / Plan / Recommendation Clinical Impression  Session focused on assessing po tolerance, education re: GERD-dysphagia compensation strategies. Discussed an antichoking device that may be helpful to have for emergent use and daughter purchased it for pt. Universal sign of choking reviewed with pt and family - and daughter demonstrated its use. - Pt's wife reports pt did not remove his dentures or lower partial last night - and he has slept in them for years. Advised even if he wants to sleep in dentures to clean them and his gums/tongue prior to donning them for bed. Wife and daughter report pt did NOT cough while eating breakfast, nor did he have eructation and he attempted to slow rate of eating. Daughter states she thinks it's a good idea that pt is on a PPI. Reiterated importance for pt to maintain strength of cough and voice for airway protection and pt demonstrated cough effort.  No SLP follow up indicated as all education completed for compensation.  Thanks for allowing this SLP to help with this most pleasant pt and family.    HPI HPI: Patient is a 89 y.o. male with PMH: chronic respiratory failure with hypoxia, malnutrition, anxiety, COPD, CKD, GERD, dyspnea, GI bleed, CAD, basal cell carcinoma. Now adm with potential seizures. Recent adm 07/20/22 with c/o somnolence and confusion.  Prior Imaging showed possible PNA, current chest imaging showed ATX and small right pleural effusion.  Pt has been prescribed CPAP at night but per his wife, he has not been using it.  He required BiPAP on admit and now is on nasal cannula. Wife present and both pt/wife report no dysphagia.  Pt diagnosed with GERD previously - and ulcer in 2015 per endoscopy - he was placed on a PPI BID per Dr Nita Sells  report.  Wife denies pt being on reflux medications recently and reports pt having frequent issues with nasal congestion.      SLP Plan  All goals met      Recommendations for follow up therapy are one component of a multi-disciplinary discharge planning process, led by the attending physician.  Recommendations may be updated based on patient status, additional functional criteria and insurance authorization.    Recommendations  Diet recommendations: Regular;Thin liquid Liquids provided via: Cup;Straw Medication Administration: Whole meds with liquid Compensations: Slow rate;Small sips/bites Postural Changes and/or Swallow Maneuvers: Out of bed for meals;Seated upright 90 degrees                  Oral care BID     Dysphagia, unspecified (R13.10)     All goals met   Rolena Infante, MS [facility] SLP Acute Rehab Services Office [phone]   Chales Abrahams  08/05/2022, 10:33 AM

## 2022-08-05 NOTE — Progress Notes (Addendum)
Progress Note    Gerald Valdez  HYQ:[id number] DOB: 1929-02-06  DOA: 08/01/2022 PCP: [name], MD      Brief Narrative:    Medical records reviewed and are as summarized below:  Gerald Valdez is a 89 y.o. male history significant of bladder cancer on chronic Foley, chronic hypoxic and hypercarbic respiratory failure on 3 to oxygen, COPD, HFpEF EF 40 to 45%, hypertension, hyperlipidemia, chronic anemia, CKD stage 3A and atrial fibrillation on Eliquis 2.5 twice daily presented to emergency department from nursing home with concern for seizure.    In the ER workup consistent with hypercarbic respiratory failure, UTI, possible myoclonic jerks, had CT brain, MRI brain and EEG which were unremarkable.  He was initially placed on BiPAP weaned off of BiPAP.       Assessment/Plan:   Principal Problem:   Acute hypoxic on chronic hypercapnic respiratory failure (HCC) Active Problems:   Acute metabolic encephalopathy   Acute hemorrhagic cystitis   History of bladder cancer on chronic Foley   Chronic systolic CHF preserved EF 40 to 45% (congestive heart failure) (HCC)   H/O atrial flutter   Essential hypertension   Hyperlipidemia   CKD stage 3a, GFR 45-59 ml/min (HCC)   GERD (gastroesophageal reflux disease)   DNR (do not resuscitate)    Body mass index is 21.35 kg/m.   Diffuse upper extremity myoclonic jerks.  Less likely seizures, brought on by combination of hypercarbic, hypoxic respiratory failure and UTI.  Dr. Amada Jupiter, neurologist, and reviewed the case with Dr. Thedore Mins on 08/03/2022.  Seizure has been ruled out. It was more consistent with myoclonic jerks in the setting of hypoxia, hypercarbia and UTI.   No AEDs are required, CT head, MRI brain and EEG unremarkable. He is off of BiPAP and is tolerating 3 L/min oxygen via [place].     Acute on chronic hypoxic and hypercarbic respiratory failure.  Due to COPD on 3 L nasal cannula oxygen at all times.  Likely had  mild COPD exacerbation, required BiPAP initially. He is off of BiPAP and is tolerating 3 L/min oxygen via [place] Of note he recently had pneumonia in the right lower lobe from which he is recovering.  He is at risk for aspiration and BiPAP should be used as a last resort.    Acute metabolic encephalopathy -improved.  Likely due to combination of UTI and respiratory failure above. 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 Family was concerned about recurrent UTI and felt that past UTIs have probably not been treated adequately.  They inquired about long-term suppression antibiotic.  Deferred long-term suppression antibiotics to primary care physician or urologist. Plan to complete 5 days of IV ceftriaxone on 08/06/2022.   Iron deficiency anemia Hemoglobin dropped from 10.5-8.8.  Ferritin is 28. His daughter requested IV iron because it has helped him in the past and made him feel stronger.. Patient is afebrile with normal WBC.  Give 1 dose of IV iron sucrose.    History of bladder cancer on chronic Foley, recent hematuria with Eliquis- Patient had foley cathter replaced at the nursing home. He has been referred to Oncology for treatment.  Follows with Dr. Retta Diones.   Chronic systolic CHF preserved EF 40 to 45% (congestive heart failure) (HCC) - hold further IV fluids, currently compensated, gentle Lasix x  1 on 08/03/2022 and monitor. Lasix as needed.   Paroxysmal atrial fibrillation/atrial flutter.  CHA2DS2-VASc score is  Heart rate is better. Metoprolol has been changed from metoprolol to tartrate 37.5 mg twice daily to metoprolol succinate 75 mg daily. Aspirin and Eliquis have been discontinued because of hematuria. No plans for cardioversion.  Cardiologist has signed off.   Moderate PCM.  Protein supplementation.    Other  comorbidities include CKD stage IIIa, hypertension, GERD, hyperlipidemia   I had a long discussion with patient, his wife and daughter at the bedside.  Plan to discharge home tomorrow if he continues to improve.  Family prefers to take him home. Family is open to pursuing comfort measures if patient declines.        Diet Order             Diet regular Room service appropriate? Yes; Fluid consistency: Thin  Diet effective now                            Consultants: Cardiologist  Procedures: None    Medications:    escitalopram  10 mg Oral Daily   feeding supplement  237 mL Oral BID BM   metoprolol succinate  75 mg Oral Daily   pantoprazole  40 mg Oral BID AC   polyethylene glycol  17 g Oral Daily   saccharomyces boulardii  250 mg Oral BID   tamsulosin  0.4 mg Oral QPC supper   umeclidinium-vilanterol  1 puff Inhalation Daily   Continuous Infusions:  cefTRIAXone (ROCEPHIN)  IV 1 g (08/05/22 1012)   thiamine (VITAMIN B1) injection 500 mg (08/05/22 1239)     Anti-infectives (From admission, onward)    Start     Dose/Rate Route Frequency Ordered Stop   08/02/22 1000  cefTRIAXone (ROCEPHIN) 1 g in sodium chloride 0.9 % 100 mL IVPB        1 g 200 mL/hr over 30 Minutes Intravenous Every 24 hours 08/02/22 0517 08/07/22 0959   08/01/22 2015  cefTRIAXone (ROCEPHIN) 1 g in sodium chloride 0.9 % 100 mL IVPB        1 g 200 mL/hr over 30 Minutes Intravenous  Once 08/01/22 2009 08/01/22 2359              Family Communication/Anticipated D/C date and plan/Code Status   DVT prophylaxis: Place and maintain sequential compression device Start: 08/03/22 1050 SCDs Start: 08/02/22 0438     Code Status: DNR  Family Communication: Plan discussed with the family (wife and daughter) at the bedside. Disposition Plan: Plan to discharge to SNF   Status is: Inpatient Remains inpatient appropriate because: Atrial flutter with RVR, UTI,  hematuria       Subjective:   Interval events noted.  No new complaints.  He still has hematuria but bloody urine is lighter than before.  His family was at the bedside.  Wife and daughter) and  Objective:    Vitals:   08/05/22 0758 08/05/22 0802 08/05/22 1100 08/05/22 1135  BP:  121/65  112/63  Pulse:  85  88  Resp:  (!) 22    Temp:  98 F (36.7 C)  98.4 F (36.9 C)  TempSrc:  Oral  Oral  SpO2: 97% 96% 92% 98%  Weight:       No data found.    Intake/Output Summary (Last 24 hours) at 08/05/2022 1547 Last data filed at 08/05/2022 1500 Gross per 24 hour  Intake  1316.94 ml  Output 250 ml  Net 1066.94 ml   Filed Weights   08/03/22 0400 08/04/22 0500  Weight: 59.8 kg 60 kg    Exam:  GEN: NAD SKIN: Warm and dry EYES: No pallor or  icterus ENT: MMM CV: RRR PULM: CTA B ABD: soft, ND, NT, +BS CNS: AAO x 2 (person and place), non focal EXT: No edema or tenderness GU: Foley catheter draining bloody urine ( bloody urine is becoming lighter)     Data Reviewed:   I have personally reviewed following labs and imaging studies:  Labs: Labs show the following:   Basic Metabolic Panel: Recent Labs  Lab 08/01/22 1354 08/01/22 1403 08/02/22 0555 08/02/22 0933 08/03/22 0232 08/03/22 0604 08/04/22 0312 08/05/22 0545  NA 134*   < > 131* 136 137  --  134* 134*  K 5.0   < > 5.1 4.1 4.4  --  4.4 4.2  CL 91*  --  93*  --  94*  --  94* 95*  CO2 31  --  29  --  35*  --  29 32  GLUCOSE 114*  --  73  --  106*  --  93 94  BUN 16  --  15  --  13  --  15 18  CREATININE 1.12  --  1.04  --  1.11  --  1.36* 1.25*  CALCIUM 8.9  --  8.5*  --  8.5*  --  8.2* 8.1*  MG  --   --   --   --   --  1.7 2.4 2.1   < > = values in this interval not displayed.   GFR Estimated Creatinine Clearance: 32.7 mL/min (A) (by C-G formula based on SCr of 1.25 mg/dL (H)). Liver Function Tests: Recent Labs  Lab 08/02/22 0555  AST 33  ALT 35  ALKPHOS 32*  BILITOT 0.6  PROT 5.2*  ALBUMIN  3.0*   No results for input(s): "LIPASE", "AMYLASE" in the last 168 hours. Recent Labs  Lab 08/03/22 0604  AMMONIA <10   Coagulation profile No results for input(s): "INR", "PROTIME" in the last 168 hours.  CBC: Recent Labs  Lab 08/01/22 1354 08/01/22 1403 08/02/22 0502 08/02/22 0933 08/03/22 0232 08/04/22 0312 08/05/22 0545  WBC 10.2  --   --   --  8.1 8.9 7.5  NEUTROABS  --   --   --   --   --  6.0 4.8  HGB 9.3*   < > 10.2* 8.8* 9.2* 9.2* 8.8*  HCT 31.0*   < > 30.0* 26.0* 30.2* 29.6* 28.3*  MCV 96.9  --   --   --  95.6 96.4 97.6  PLT 207  --   --   --  194 181 174   < > = values in this interval not displayed.   Cardiac Enzymes: No results for input(s): "CKTOTAL", "CKMB", "CKMBINDEX", "TROPONINI" in the last 168 hours. BNP (last 3 results) No results for input(s): "PROBNP" in the last 8760 hours. CBG: Recent Labs  Lab 08/01/22 1345  GLUCAP 99   D-Dimer: No results for input(s): "DDIMER" in the last 72 hours. Hgb A1c: No results for input(s): "HGBA1C" in the last 72 hours. Lipid Profile: No results for input(s): "CHOL", "HDL", "LDLCALC", "TRIG", "CHOLHDL", "LDLDIRECT" in the last 72 hours. Thyroid function studies: Recent Labs    08/03/22 0604  TSH 4.079   Anemia work up: Recent Labs    08/05/22 0545  FERRITIN 28  Sepsis Labs: Recent Labs  Lab 08/01/22 1354 08/03/22 0232 08/03/22 0604 08/04/22 0312 08/05/22 0545  PROCALCITON  --   --  <0.10 <0.10 <0.10  WBC 10.2 8.1  --  8.9 7.5    Microbiology Recent Results (from the past 240 hour(s))  Urine Culture     Status: None   Collection Time: 08/01/22  8:10 PM   Specimen: Urine, Clean Catch  Result Value Ref Range Status   Specimen Description URINE, CLEAN CATCH  Final   Special Requests NONE  Final   Culture   Final    NO GROWTH Performed at [facility], [address] [place], [place] [address]    Report Status 08/03/2022 FINAL  Final    Procedures and diagnostic studies:  No  results found.             LOS: 3 days   Edric Fetterman  [facility]   Pager on [email or web address]. If 7PM-7AM, please contact night-coverage at [email or web address]     08/05/2022, 3:47 PM

## 2022-08-05 NOTE — Care Management Important Message (Signed)
Important Message  Patient Details  Name: Gerald Valdez MRN: [id number] Date of Birth: [birth year]   Medicare Important Message Given:  Yes     Dorena Bodo 08/05/2022, 2:17 PM

## 2022-08-06 DIAGNOSIS — G9341 Metabolic encephalopathy: Secondary | ICD-10-CM | POA: Diagnosis not present

## 2022-08-06 LAB — CBC WITH DIFFERENTIAL/PLATELET
Abs Immature Granulocytes: 0.1 10*3/uL — ABNORMAL HIGH (ref 0.00–0.07)
Basophils Absolute: 0 10*3/uL (ref 0.0–0.1)
Basophils Relative: 0 %
Eosinophils Absolute: 0.2 10*3/uL (ref 0.0–0.5)
Eosinophils Relative: 3 %
HCT: 29.4 % — ABNORMAL LOW (ref 39.0–52.0)
Hemoglobin: 9 g/dL — ABNORMAL LOW (ref 13.0–17.0)
Immature Granulocytes: 1 %
Lymphocytes Relative: 10 %
Lymphs Abs: 0.7 10*3/uL (ref 0.7–4.0)
MCH: 29.3 pg (ref 26.0–34.0)
MCHC: 30.6 g/dL (ref 30.0–36.0)
MCV: 95.8 fL (ref 80.0–100.0)
Monocytes Absolute: 0.3 10*3/uL (ref 0.1–1.0)
Monocytes Relative: 5 %
Neutro Abs: 6 10*3/uL (ref 1.7–7.7)
Neutrophils Relative %: 81 %
Platelets: 172 10*3/uL (ref 150–400)
RBC: 3.07 MIL/uL — ABNORMAL LOW (ref 4.22–5.81)
RDW: 13.2 % (ref 11.5–15.5)
WBC: 7.3 10*3/uL (ref 4.0–10.5)
nRBC: 0 % (ref 0.0–0.2)

## 2022-08-06 LAB — BASIC METABOLIC PANEL
Anion gap: 7 (ref 5–15)
BUN: 18 mg/dL (ref 8–23)
CO2: 33 mmol/L — ABNORMAL HIGH (ref 22–32)
Calcium: 8.3 mg/dL — ABNORMAL LOW (ref 8.9–10.3)
Chloride: 93 mmol/L — ABNORMAL LOW (ref 98–111)
Creatinine, Ser: 1.33 mg/dL — ABNORMAL HIGH (ref 0.61–1.24)
GFR, Estimated: 50 mL/min — ABNORMAL LOW (ref >60)
Glucose, Bld: 93 mg/dL (ref 70–99)
Potassium: 4.4 mmol/L (ref 3.5–5.1)
Sodium: 133 mmol/L — ABNORMAL LOW (ref 135–145)

## 2022-08-06 LAB — PROCALCITONIN: Procalcitonin: <0.1 ng/mL

## 2022-08-06 LAB — C-REACTIVE PROTEIN: CRP: 0.5 mg/dL (ref <1.0)

## 2022-08-06 LAB — MAGNESIUM: Magnesium: 1.9 mg/dL (ref 1.7–2.4)

## 2022-08-06 MED ORDER — PANTOPRAZOLE SODIUM 40 MG PO TBEC: 40.0000 mg | DELAYED_RELEASE_TABLET | Freq: Two times a day (BID) | ORAL | 1 refills | Status: DC

## 2022-08-06 MED ORDER — TAMSULOSIN HCL 0.4 MG PO CAPS: 0.4000 mg | ORAL_CAPSULE | Freq: Every day | ORAL | 1 refills | Status: DC

## 2022-08-06 MED ORDER — METOPROLOL SUCCINATE ER 25 MG PO TB24: 75.0000 mg | ORAL_TABLET | Freq: Every day | ORAL | 1 refills | Status: DC

## 2022-08-06 NOTE — Plan of Care (Signed)
Patient  is discharging home today with family with PT/OT  and home health.

## 2022-08-06 NOTE — Discharge Summary (Signed)
Physician Discharge Summary   Patient: Gerald Valdez MRN: [id number] DOB: 14-May-1928  Admit date:     08/01/2022  Discharge date: 08/06/22  Discharge Physician: Jonah Blue   PCP: [name], MD   Recommendations at discharge:   If recurrent UTI is an issue in the future, consider prophylactic antibiotics to prevent UTI Stop aspirin and Eliquis Follow up with urology, cardiology, and oncology as recommended scheduled Follow up with PCP in 1-2 weeks  Discharge Diagnoses: Principal Problem:   Acute metabolic encephalopathy Active Problems:   Acute hemorrhagic cystitis   History of bladder cancer on chronic Foley   Acute hypoxic on chronic hypercapnic respiratory failure (HCC)   Chronic systolic CHF preserved EF 40 to 45% (congestive heart failure) (HCC)   H/O atrial flutter   Essential hypertension   Hyperlipidemia   CKD stage 3a, GFR 45-59 ml/min (HCC)   GERD (gastroesophageal reflux disease)   DNR (do not resuscitate)    Hospital Course: 89 yo male with h/o bladder cancer with chronic foley catheter, chronic hypoxemic and hypercapnic respiratory failure due to COPD, heart failure, hypertension, CKD, and atrial fibrillation who presented with altered mental status. Patient uses a wheelchair for ambulation and is a resident at nursing home.  Recent hospitalization 06/10 to 06/18 for respiratory failure, heart failure, atrial flutter and encephalopathy.  This time apparently he was noted to have shaking movements for 30 minutes while seated in his wheelchair, ? LOC. In the ED he was alert but in respiratory distress and was placed on Bipap. Patient not following commands. Unremarkable VBG/ABG other than hypercapnia 60-70.  Head CT negative. Abdomen and pelvis CT hyperdense fluid within the bladder (possible blood products). Irregular and thickened bladder wall, with mild adjacent stranding compatible with cystitis. Lungs with severe centrilobular emphysema. Small right  pleural effusion. CXR negative.  Myoclonic jerks not thought to be from seizures, neurology consulted.  Completing Rocephin for cystitis on 6/28. Cardiology consulted and has signed off.    Assessment and Plan: * Acute metabolic encephalopathy Mild dementia at baseline, thought to be related to superimposed delirium associated with UTI Brain MRI with no acute changes Given IV fluids and IV thiamine Poor oral intake due to encephalopathy  EEG with diffuse encephalopathy Encephalopathy has returned to baseline with treatment of UTI  Acute hemorrhagic cystitis Sepsis present on admission, thought to be due to UTI Urine culture was negative Completed antibiotic therapy with ceftriaxone on 6/28 with return to baseline  History of bladder cancer on chronic Foley Patient had foley cathter replaced at the nursing home He has been referred to Oncology for treatment as well as to urology (appt scheduled for August) Family requested voiding trial prior to dc Voiding trial unsuccessful; he will go home with a foley and may need to consider prophylactic antibiotics if he has recurrent UTIs Continue tamsulosin  Acute hypoxic on chronic hypercapnic respiratory failure (HCC) Known h/o COPD on 3L home O2 Acute respiratory failure on presentation, CO2 60-70 on blood gases Started on BIPAP, and this has been weaned off and back to home O2  Avoid benzodiazepines Though to be related to acute encephalopathy with delirium, now returned to baseline and no longer requiring additional O2 supplementation compared to baseline Continue Anoro  Chronic systolic CHF preserved EF 40 to 45% (congestive heart failure) (HCC) Appears compensated at this time  H/O atrial flutter Persistent atrial flutter on telemetry Considering acute hematuria and bladder cancer, plan is to hold on anticoagulation and ASA for now Acute  blood loss anemia, follow up hgb is 9 and stable   Essential hypertension Continue Toprol XL  (changed from short-acting Lopressor on admission)  Hyperlipidemia Discontinue atorvastatin as per cardiology  CKD stage 3a, GFR 45-59 ml/min (HCC) Appears to be stable at this time Attempt to avoid nephrotoxic medications  GERD (gastroesophageal reflux disease) Continue BID Protonix and probiotics  DNR (do not resuscitate) Will provide out of facility DNR form at time of dc    Consultants: Cardiology; [facility] team; PT/OT/SLP Procedures performed: EEG  Disposition: Home Diet recommendation:  Cardiac diet DISCHARGE MEDICATION: Allergies as of 08/06/2022   No Known Allergies      Medication List     STOP taking these medications    apixaban 2.5 MG Tabs tablet Commonly known as: ELIQUIS   ferrous sulfate 325 (65 FE) MG tablet   metoprolol tartrate 25 MG tablet Commonly known as: LOPRESSOR       TAKE these medications    ascorbic acid 500 MG tablet Commonly known as: VITAMIN C Take 500 mg by mouth daily.   atorvastatin 40 MG tablet Commonly known as: LIPITOR Take 1 tablet (40 mg total) by mouth daily.   escitalopram 10 MG tablet Commonly known as: LEXAPRO Take 10 mg by mouth daily.   feeding supplement Liqd Take 237 mLs by mouth 2 (two) times daily between meals.   loratadine 10 MG tablet Commonly known as: CLARITIN Take 10 mg by mouth daily.   metoprolol succinate 25 MG 24 hr tablet Commonly known as: TOPROL-XL Take 3 tablets (75 mg total) by mouth daily. Start taking on: August 07, 2022   multivitamin with minerals Tabs tablet Take 1 tablet by mouth daily with breakfast.   OXYGEN Inhale 3 L into the lungs continuous.   pantoprazole 40 MG tablet Commonly known as: PROTONIX Take 1 tablet (40 mg total) by mouth 2 (two) times daily before a meal.   polyethylene glycol 17 g packet Commonly known as: MIRALAX / GLYCOLAX Take 17 g by mouth daily as needed for mild constipation.   saccharomyces boulardii 250 MG capsule Commonly known as:  FLORASTOR Take 1 capsule (250 mg total) by mouth 2 (two) times daily.   tamsulosin 0.4 MG Caps capsule Commonly known as: FLOMAX Take 1 capsule (0.4 mg total) by mouth daily after supper.   umeclidinium-vilanterol 62.5-25 MCG/ACT Aepb Commonly known as: ANORO ELLIPTA Inhale 1 puff into the lungs daily.        Follow-up Information     Louanne Skye Devoria Albe., NP Follow up.   Specialty: Cardiology Why: [facility] - [address] location - cardiology follow-up arranged on Friday August 13, 2022 10:05 AM (Arrive by 9:50 AM). Alden Server is one of our nurse practitioners that works with Dr. Izora Ribas. Dr. Izora Ribas also sent a message to his nurse to arrange follow-up with him in November 2024. Contact information: [address] [place] [address] [phone]         Care, [facility] Follow up.   Why: [facility] will call within 48 hours of discharge to arrange home visit Contact information: [address] [place] [address] [phone]                Discharge Exam: Filed Weights   08/03/22 0400 08/04/22 0500 08/06/22 0400  Weight: 59.8 kg 60 kg 63.9 kg   Subjective: He reports feeling ok today, no specific complaints.  Family believes that he is ready for dc.  They request a  voiding trial prior to dc and are very comfortable with monitoring his output to ensure he is not having ongoing retention, assuming success.   Physical Exam:       Vitals:    08/06/22 0000 08/06/22 0200 08/06/22 0400 08/06/22 0415  BP: (!) 106/56 104/66 (!) 215/199 119/69  Pulse: 87 84 86    Resp: (!) 23 (!) 22 (!) 22    Temp: 97.7 F (36.5 C)   (!) 97.5 F (36.4 C) 98 F (36.7 C)  TempSrc: Oral   Oral    SpO2: 94% 94% 96%    Weight:     63.9 kg      General:  Appears calm and comfortable and is in NAD, on chronic [place] O2 Eyes:   EOMI, normal lids, iris ENT:  Hard of hearing, grossly normal lips & tongue, mmm Neck:  no LAD, masses or  thyromegaly Cardiovascular:  RRR, no m/r/g. No LE edema.  Respiratory:   CTA bilaterally with no wheezes/rales/rhonchi.  Normal respiratory effort. Abdomen:  soft, NT, ND Skin:  no rash or induration seen on limited exam Musculoskeletal:   no bony abnormality Psychiatric:  blunted mood and affect, speech fluent and appropriate, AOx3 Neurologic:  CN 2-12 grossly intact, moves all extremities in coordinated fashion      EKG: last on 6/25     Labs on Admission: I have personally reviewed the available labs and imaging studies at the time of the admission.   Pertinent labs:     Na++ 133 CO2 33 BUN 18/Creatinine 1.33/GFR 50 - stable CRP 0.5 Procalcitonin <0.10 WBC 7.3 Hgb 9 - stable  Condition at discharge: improving  The results of significant diagnostics from this hospitalization (including imaging, microbiology, ancillary and laboratory) are listed below for reference.   Imaging Studies: DG Chest Port 1 View  Result Date: 08/03/2022 CLINICAL DATA:  89 year old male with history of shortness of breath. EXAM: PORTABLE CHEST 1 VIEW COMPARISON:  Chest x-ray 08/31/2022. FINDINGS: Poorly defined opacity in the right lung base partially obscuring the right hemidiaphragm, which may reflect some airspace consolidation and/or presence of a small right pleural effusion. Left lung is clear. No left pleural effusion. No pneumothorax. No evidence of pulmonary edema. Heart size is normal. Upper mediastinal contours are within normal limits. Atherosclerotic calcifications are noted in the thoracic aorta. IMPRESSION: 1. New opacity in the right lung base partially obscuring the right hemidiaphragm concerning for developing right lower lobe airspace consolidation with small superimposed right pleural effusion. 2. Aortic atherosclerosis. Electronically Signed   By: Trudie Reed M.D.   On: 08/03/2022 06:16   EEG adult  Result Date: 08/02/2022 Charlsie Quest, MD     08/02/2022  1:51 PM Patient  Name: Gerald Valdez MRN: [id number] Epilepsy Attending: Charlsie Quest Referring Physician/Provider: Tereasa Coop, MD Date: 08/02/2022 Duration: 24.47 mins Patient history: 89yo M with 1 episode of seizure-like activity at nursing home and en route to EMS for seizure-like episode. EMS reported bilateral hand tremor. EEG to evaluate for seizure. Level of alertness: Awake, asleep AEDs during EEG study: None Technical aspects: This EEG study was done with scalp electrodes positioned according to the 10-20 International system of electrode placement. Electrical activity was reviewed with band pass filter of 1-70Hz , sensitivity of 7 uV/mm, display speed of 60mm/sec with a 60Hz  notched filter applied as appropriate. EEG data were recorded continuously and digitally stored.  Video monitoring was available and reviewed as appropriate. Description: No posterior dominant rhythm was seen.  Sleep was characterized by vertex waves, sleep spindles (12 to 14 Hz), maximal frontocentral region. EEG showed continuous generalized 3 to 6 Hz theta-delta slowing. Hyperventilation and photic stimulation were not performed.   ABNORMALITY - Continuous slow, generalized IMPRESSION: This study is suggestive of moderate diffuse encephalopathy, nonspecific etiology. No seizures or epileptiform discharges were seen throughout the recording. Charlsie Quest   CT ABDOMEN PELVIS W CONTRAST  Result Date: 08/01/2022 CLINICAL DATA:  Hematuria EXAM: CT ABDOMEN AND PELVIS WITH CONTRAST TECHNIQUE: Multidetector CT imaging of the abdomen and pelvis was performed using the standard protocol following bolus administration of intravenous contrast. RADIATION DOSE REDUCTION: This exam was performed according to the departmental dose-optimization program which includes automated exposure control, adjustment of the mA and/or kV according to patient size and/or use of iterative reconstruction technique. CONTRAST:  60mL OMNIPAQUE IOHEXOL 350 MG/ML SOLN  COMPARISON:  CT abdomen and pelvis 07/01/2021 FINDINGS: Lower chest: Emphysema. Small right pleural effusion and atelectasis. Hepatobiliary: No focal liver abnormality is seen. No gallstones, gallbladder wall thickening, or biliary dilatation. Pancreas: Unremarkable. Spleen: Unremarkable. Adrenals/Urinary Tract: Normal adrenal glands. Bilateral cortical renal scarring. No urinary calculi or hydronephrosis. Foley catheter in the bladder. Locules of gas within the bladder. There is hyperdense fluid within the bladder which may represent blood products. The bladder wall is irregular and thickened with mild adjacent stranding compatible with cystitis. Stomach/Bowel: Normal caliber large and small bowel. Extensive sigmoid diverticulosis without evidence of diverticulitis. Moderate stool ball in the rectum. Stomach is within normal limits. Vascular/Lymphatic: Aortic atherosclerosis. No enlarged abdominal or pelvic lymph nodes. Reproductive: No acute abnormality. Other: No free intraperitoneal fluid or air. Musculoskeletal: Demineralization.  No acute fracture. IMPRESSION: 1. Findings suspicious for hemorrhagic cystitis. 2. Small right pleural effusion and atelectasis. Aortic Atherosclerosis (ICD10-I70.0). Electronically Signed   By: Minerva Fester M.D.   On: 08/01/2022 23:59   MR BRAIN WO CONTRAST  Result Date: 08/01/2022 CLINICAL DATA:  Delirium EXAM: MRI HEAD WITHOUT CONTRAST TECHNIQUE: Multiplanar, multiecho pulse sequences of the brain and surrounding structures were obtained without intravenous contrast. COMPARISON:  None Available. FINDINGS: Brain: No acute infarct, mass effect or extra-axial collection. No acute or chronic hemorrhage. There is confluent hyperintense T2-weighted signal within the white matter. Generalized volume loss. The midline structures are normal. Vascular: Major flow voids are preserved. Skull and upper cervical spine: Normal calvarium and skull base. Visualized upper cervical spine and  soft tissues are normal. Sinuses/Orbits:No paranasal sinus fluid levels or advanced mucosal thickening. Small amount of left mastoid fluid. There are bilateral lens replacements. IMPRESSION: 1. No acute intracranial abnormality. 2. Chronic small vessel ischemia and volume loss. Electronically Signed   By: Deatra Robinson M.D.   On: 08/01/2022 22:06   CT Head Wo Contrast  Result Date: 08/01/2022 CLINICAL DATA:  Altered level of consciousness EXAM: CT HEAD WITHOUT CONTRAST TECHNIQUE: Contiguous axial images were obtained from the base of the skull through the vertex without intravenous contrast. RADIATION DOSE REDUCTION: This exam was performed according to the departmental dose-optimization program which includes automated exposure control, adjustment of the mA and/or kV according to patient size and/or use of iterative reconstruction technique. COMPARISON:  None Available. FINDINGS: Brain: Evaluation is limited by streak artifact. No evidence of acute infarct or hemorrhage. Chronic appearing small vessel ischemic changes are seen throughout the periventricular white matter. The lateral ventricles and midline structures are unremarkable. No acute extra-axial fluid collections. No mass effect. Vascular: No hyperdense vessel or unexpected calcification. Skull: Normal. Negative for fracture or  focal lesion. Sinuses/Orbits: No acute finding. Other: None. IMPRESSION: 1. No acute intracranial process. Electronically Signed   By: Sharlet Salina M.D.   On: 08/01/2022 15:23   DG Chest Port 1 View  Result Date: 08/01/2022 CLINICAL DATA:  Weakness EXAM: PORTABLE CHEST 1 VIEW COMPARISON:  07/19/2022 FINDINGS: Stable cardiomediastinal contours. Aortic atherosclerosis. Improved aeration at the right lung base. No new focal airspace consolidation. No pleural effusion or pneumothorax. IMPRESSION: No active disease. Electronically Signed   By: Duanne Guess D.O.   On: 08/01/2022 14:39   ECHOCARDIOGRAM COMPLETE  Result  Date: 07/21/2022    ECHOCARDIOGRAM REPORT   Patient Name:   TERIQ RABON Date of Exam: 07/21/2022 Medical Rec #:  [id number]       Height:       66.0 in Accession #:    [id number]      Weight:       145.9 lb Date of Birth:  02-13-1928       BSA:          1.749 m Patient Age:    89 years        BP:           108/53 mmHg Patient Gender: M               HR:           97 bpm. Exam Location:  Inpatient Procedure: 2D Echo, Cardiac Doppler and Color Doppler Indications:    CHF-Acute Diastolic I50.31  History:        Patient has prior history of Echocardiogram examinations, most                 recent 02/11/2020. CKD 3 and COPD; Risk Factors:Hypertension,                 Dyslipidemia and Former Smoker.  Sonographer:    Dondra Prader RVT RCS Referring Phys: [id number] EMILY B MULLEN  Sonographer Comments: Technically challenging study due to limited acoustic windows, Technically difficult study due to poor echo windows, suboptimal parasternal window, suboptimal apical window and suboptimal subcostal window. Image acquisition challenging due to respiratory motion, Image acquisition challenging due to patient body habitus and Image acquisition challenging due to uncooperative patient. IMPRESSIONS  1. Left ventricular ejection fraction, by estimation, is 45 to 50%. The left ventricle has mildly decreased function. The left ventricle has no regional wall motion abnormalities. There is mild left ventricular hypertrophy. Left ventricular diastolic parameters are indeterminate.  2. Right ventricular systolic function is normal. The right ventricular size is mildly enlarged.  3. Right atrial size was mildly dilated.  4. A small pericardial effusion is present. The pericardial effusion is anterior to the right ventricle. There is no evidence of cardiac tamponade.  5. The mitral valve is degenerative. Mild to moderate mitral valve regurgitation.  6. The aortic valve was not well visualized. Aortic valve regurgitation is not visualized.  Comparison(s): Prior images reviewed side by side. LVEF appears slightly decreased; this study is more technically difficult than prior. FINDINGS  Left Ventricle: LVOT calcification unchanged from prior. Left ventricular ejection fraction, by estimation, is 45 to 50%. The left ventricle has mildly decreased function. The left ventricle has no regional wall motion abnormalities. The left ventricular internal cavity size was normal in size. There is mild left ventricular hypertrophy. Left ventricular diastolic parameters are indeterminate. Right Ventricle: The right ventricular size is mildly enlarged. No increase in right ventricular wall thickness. Right ventricular systolic function is  normal. Left Atrium: Left atrial size was normal in size. Right Atrium: Right atrial size was mildly dilated. Pericardium: A small pericardial effusion is present. The pericardial effusion is anterior to the right ventricle. There is no evidence of cardiac tamponade. Mitral Valve: The mitral valve is degenerative in appearance. Mild to moderate mitral valve regurgitation. Tricuspid Valve: The tricuspid valve is normal in structure. Tricuspid valve regurgitation is mild . No evidence of tricuspid stenosis. Aortic Valve: The aortic valve was not well visualized. Aortic valve regurgitation is not visualized. Aortic valve mean gradient measures 4.0 mmHg. Aortic valve peak gradient measures 6.7 mmHg. Aortic valve area, by VTI measures 1.77 cm. Pulmonic Valve: The pulmonic valve was not well visualized. Pulmonic valve regurgitation is not visualized. Aorta: The aortic root is normal in size and structure and the ascending aorta was not well visualized. IAS/Shunts: No atrial level shunt detected by color flow Doppler.  LEFT VENTRICLE PLAX 2D LVIDd:         4.20 cm   Diastology LVIDs:         3.30 cm   LV e' medial:    6.50 cm/s LV PW:         1.10 cm   LV E/e' medial:  10.7 LV IVS:        0.90 cm   LV e' lateral:   8.18 cm/s LVOT diam:      1.90 cm   LV E/e' lateral: 8.5 LV SV:         38 LV SV Index:   22 LVOT Area:     2.84 cm  RIGHT VENTRICLE             IVC RV Basal diam:  3.90 cm     IVC diam: 2.50 cm RV S prime:     11.70 cm/s TAPSE (M-mode): 1.7 cm LEFT ATRIUM           Index        RIGHT ATRIUM           Index LA diam:      3.90 cm 2.23 cm/m   RA Area:     18.20 cm LA Vol (A2C): 48.6 ml 27.79 ml/m  RA Volume:   56.90 ml  32.53 ml/m LA Vol (A4C): 37.5 ml 21.44 ml/m  AORTIC VALVE AV Area (Vmax):    1.62 cm AV Area (Vmean):   1.57 cm AV Area (VTI):     1.77 cm AV Vmax:           129.00 cm/s AV Vmean:          88.200 cm/s AV VTI:            0.216 m AV Peak Grad:      6.7 mmHg AV Mean Grad:      4.0 mmHg LVOT Vmax:         73.70 cm/s LVOT Vmean:        48.800 cm/s LVOT VTI:          0.135 m LVOT/AV VTI ratio: 0.62  AORTA Ao Root diam: 3.40 cm MITRAL VALVE               TRICUSPID VALVE MV Area (PHT): 4.67 cm    TR Peak grad:   30.2 mmHg MV Decel Time: 163 msec    TR Vmax:        275.00 cm/s MV E velocity: 69.85 cm/s MV A velocity: 61.70 cm/s  SHUNTS MV E/A ratio:  1.13        Systemic VTI:  0.14 m                            Systemic Diam: 1.90 cm Riley Lam MD Electronically signed by Riley Lam MD Signature Date/Time: 07/21/2022/5:42:19 PM    Final    DG Chest Portable 1 View  Result Date: 07/19/2022 CLINICAL DATA:  Shortness of breath, altered mental status. EXAM: PORTABLE CHEST 1 VIEW COMPARISON:  Chest radiograph 06/27/2021 FINDINGS: The cardiomediastinal silhouette is normal. Coarsened interstitial markings in both lungs are unchanged, chronic. There is hazy opacity in the right lower lobe and a probable small right pleural effusion. There is no other focal airspace opacity. There is no significant left effusion. There is no pneumothorax There is no acute osseous abnormality. IMPRESSION: Hazy right lower lobe opacity may reflect right lower lobe pneumonia and small parapneumonic effusion in the correct clinical  setting. Electronically Signed   By: Lesia Hausen M.D.   On: 07/19/2022 17:26    Microbiology: Results for orders placed or performed during the hospital encounter of 08/01/22  Urine Culture     Status: None   Collection Time: 08/01/22  8:10 PM   Specimen: Urine, Clean Catch  Result Value Ref Range Status   Specimen Description URINE, CLEAN CATCH  Final   Special Requests NONE  Final   Culture   Final    NO GROWTH Performed at [facility], [address], [place], [place] [address]    Report Status 08/03/2022 FINAL  Final       Discharge time spent: greater than 30 minutes.  Signed: Jonah Blue, MD [facility] 08/06/2022

## 2022-08-06 NOTE — Progress Notes (Signed)
Discharge teaching completed with daughter and patient's spouse. Patient discharging home with home health pt/ot and RN. Patient discharged home with patient's 02 tank on 3l [place].

## 2022-08-06 NOTE — Progress Notes (Signed)
Physical Therapy Treatment Patient Details Name: Gerald Valdez MRN: [id number] DOB: 04-Sep-1928 Today's Date: 08/06/2022   History of Present Illness 89 y/o M admitted to [facility] on 6/23 for concern of seizure. CT and MRI of brain and EEG all unremarkable. PMHx: bladder cancer on chronic Foley, chronic hypoxic and hypercarbic respiratory failure on 3L O2, COPD, HFpEF, HTN, HLD, chronic anemia, CKD, a-fib on Eliquis    PT Comments    Pt tolerated today's session poorly today, limited by fatigue and desaturation with mobility. Pt eager to mobilize upon arrival, mild desat with poor pleth reading at EOB but recovering to low 90s. Increased O2 from 3 to 4L prior to ambulation, pt tolerating ~15 feet with RW and min-modA for balance, safety, and support, SPO2 ~89-90% with ambulation. Once pt seated EOB, pt with desat to 70s, cued for deep breathing but pt trying to adjust himself on the bed, assisted back to supine and repositioned in the bed, pt placed on 6L O2 [place], noted lowest desat at 67%, slowly recovering once back in bed with deep breathing, HR in the 120s and recovering as well. Pt able to maintain SPO2 in the 90s on 5L, pt reporting feeling comfortable and family at bedside, RN notified. Acute PT will continue to follow as appropriate.     Recommendations for follow up therapy are one component of a multi-disciplinary discharge planning process, led by the attending physician.  Recommendations may be updated based on patient status, additional functional criteria and insurance authorization.  Follow Up Recommendations  Can patient physically be transported by private vehicle: No    Assistance Recommended at Discharge Frequent or constant Supervision/Assistance  Patient can return home with the following A little help with walking and/or transfers;A little help with bathing/dressing/bathroom;Assistance with cooking/housework;Assist for transportation;Help with stairs or ramp for entrance    Equipment Recommendations  None recommended by PT    Recommendations for Other Services       Precautions / Restrictions Precautions Precautions: Fall;Other (comment) Precaution Comments: monitor HR and O2 Restrictions Weight Bearing Restrictions: No     Mobility  Bed Mobility Overal bed mobility: Needs Assistance Bed Mobility: Sit to Supine, Supine to Sit     Supine to sit: Supervision, HOB elevated Sit to supine: HOB elevated, Min assist   General bed mobility comments: supervision with use of bed rails to transfer into sitting. MinA to assist back into bed for trunk support    Transfers Overall transfer level: Needs assistance Equipment used: Rolling walker (2 wheels) Transfers: Sit to/from Stand Sit to Stand: Mod assist           General transfer comment: modA to power up and steady, cueing for pt to push up from bed    Ambulation/Gait Ambulation/Gait assistance: Min assist, Mod assist Gait Distance (Feet): 15 Feet Assistive device: Rolling walker (2 wheels) Gait Pattern/deviations: Decreased stride length, Trunk flexed, Step-through pattern, Decreased dorsiflexion - right, Decreased dorsiflexion - left, Shuffle, Drifts right/left Gait velocity: decreased     General Gait Details: min-modA required for support and balance as well as intermittent RW management around obstacles. Cueing for upright posture and proximity to RW throughout   [employer]    Modified Rankin (Stroke Patients Only)       Balance Overall balance assessment: Needs assistance Sitting-balance support: Bilateral upper extremity supported, Feet supported Sitting balance-Leahy Scale: Fair     Standing balance support: Bilateral upper  extremity supported, During functional activity, Reliant on assistive device for balance Standing balance-Leahy Scale: Poor Standing balance comment: reliant on RW to maintain balance                             Cognition Arousal/Alertness: Awake/alert Behavior During Therapy: WFL for tasks assessed/performed Overall Cognitive Status: Within Functional Limits for tasks assessed                                 General Comments: Pt HOH so requiring increased time for understanding commands but able to follow, does better with visual cues        Exercises      General Comments General comments (skin integrity, edema, etc.): HR in the 120s with mobility, mild desat sitting EOB on 3L but poor pleth reading, recovering well with deep breaths and increased O2 to 4L prior to ambulating, maintaining in 90s throughout but desatting once seated EOB, cueing for deep breathing, pt desatting to 70s, returned to supine and repositioned, O2 increased to 6L, SPO2 ~67% but slowly recovering with deep breathing, able to recover to low 90s and tolerated 5L [place], RN notified      Pertinent Vitals/Pain Pain Assessment Pain Assessment: Faces Faces Pain Scale: No hurt    Home Living                          Prior Function            PT Goals (current goals can now be found in the care plan section) Acute Rehab PT Goals Patient Stated Goal: go home PT Goal Formulation: With patient/family Time For Goal Achievement: 08/18/22 Potential to Achieve Goals: Fair Progress towards PT goals: Progressing toward goals    Frequency    Min 3X/week      PT Plan Current plan remains appropriate    Co-evaluation              AM-PAC PT "6 Clicks" Mobility   Outcome Measure  Help needed turning from your back to your side while in a flat bed without using bedrails?: A Little Help needed moving from lying on your back to sitting on the side of a flat bed without using bedrails?: A Little Help needed moving to and from a bed to a chair (including a wheelchair)?: A Little Help needed standing up from a chair using your arms (e.g., wheelchair or bedside chair)?: A Little Help  needed to walk in hospital room?: A Lot Help needed climbing 3-5 steps with a railing? : A Lot 6 Click Score: 16    End of Session Equipment Utilized During Treatment: Gait belt;Oxygen Activity Tolerance: Patient limited by fatigue Patient left: in bed;with call bell/phone within reach;with bed alarm set;with family/visitor present Nurse Communication: Mobility status PT Visit Diagnosis: Other abnormalities of gait and mobility (R26.89);Muscle weakness (generalized) (M62.81);Difficulty in walking, not elsewhere classified (R26.2)     Time: 6269-4854 PT Time Calculation (min) (ACUTE ONLY): 31 min  Charges:  $Gait Training: 8-22 mins $Therapeutic Activity: 8-22 mins                     Lindalou Hose, PT DPT Acute Rehabilitation Services Office [phone]    Leonie Man 08/06/2022, 3:52 PM

## 2022-08-06 NOTE — TOC Transition Note (Signed)
Transition of Care [facility]) - CM/SW Discharge Note   Patient Details  Name: Gerald Valdez MRN: [id number] Date of Birth: 07-Jun-1928  Transition of Care [place]) CM/SW Contact:  Gordy Clement, RN Phone Number: 08/06/2022, 11:59 AM   Clinical Narrative:     Patient will DC to home with Family today.  Son is bringing portable O2 for transport. Home Health PT/OT has been arranged  with [facility] .  No additional TOC needs      Barriers to Discharge: Continued Medical Work up   Patient Goals and CMS Choice CMS Medicare.gov Compare Post Acute Care list provided to:: Patient Choice offered to / list presented to : Patient  Discharge Placement                         Discharge Plan and Services Additional resources added to the After Visit Summary for   In-house Referral: NA Discharge Planning Services: CM Consult Post Acute Care Choice: Home Health          DME Arranged: N/A (Patient already owns a RW/Rollator/tub bench/ BSC- they will borrow a wheelchair) DME Agency: NA       HH Arranged: PT, OT HH Agency: [facility] Date [facility] Agency Contacted: 08/05/22 Time HH Agency Contacted: [phone] Representative spoke with at [facility] Agency: Kandee Keen  Social Determinants of Health (SDOH) Interventions SDOH Screenings   Food Insecurity: No Food Insecurity (07/22/2022)  Housing: Low Risk  (07/22/2022)  Transportation Needs: No Transportation Needs (07/22/2022)  Utilities: Not At Risk (07/22/2022)  Tobacco Use: Medium Risk (08/02/2022)     Readmission Risk Interventions    07/27/2022    4:19 PM  Readmission Risk Prevention Plan  Transportation Screening Complete  PCP or Specialist Appt within 3-5 Days Complete  Social Work Consult for Recovery Care Planning/Counseling Complete  Palliative Care Screening Complete  Medication Review Oceanographer) Complete

## 2022-08-06 NOTE — Consult Note (Signed)
   [facility] CM Inpatient Consult   08/06/2022  RIDDIK THIERRY 1928-06-24 [id number]  [facility] [[facility]]  Accountable Care Organization [ACO] Patient: Gerald Valdez   Primary Care Provider:  [name], MD with [facility]   Patient screened for less than 7 days readmission hospitalization with noted high risk score for unplanned readmission risk 5 day length of stay and to assess for potential [facility]  [[facility]] Care Management service needs for post hospital transition for care coordination.  Review of patient's electronic medical record reveals patient is for home with [facility].   Plan:  Continue to follow progress and disposition to assess for post hospital community care coordination/management needs.  Referral request for community care coordination: make referral for post hospital follow up due to high risk readmission prevention follow up needs.  Of note, [facility] Care Management/Population Health does not replace or interfere with any arrangements made by the Inpatient Transition of Care team.  For questions contact:   Charlesetta Shanks, RN BSN CCM [facility]  [phone] business mobile phone Toll free office [phone]  *Concierge Line  [phone] Fax number: [phone] [email or web address]

## 2022-08-06 NOTE — Progress Notes (Signed)
OT Cancellation Note  Patient Details Name: Gerald Valdez MRN: [id number] DOB: 1928-08-23   Cancelled Treatment:    Reason Eval/Treat Not Completed: Other (comment) (Per RN, pt had poor activity tolerance with PT session earlier this day and pt not able to tolerate OT session at this time. Pt scheduled to discharge home later today with assistance of family and [name]. Acute OT to continue to follow as appropriate.)  Ophelia Sipe "Orson Eva., OTR/L, MA Acute Rehab [phone]   Lendon Colonel 08/06/2022, 3:18 PM

## 2022-08-06 NOTE — Progress Notes (Signed)
Foley discontinued for voiding trial per MD order.

## 2022-08-06 NOTE — Progress Notes (Addendum)
Patient discharged home with family. Patient discharged home with foley catheter in place per MD order.

## 2022-08-06 NOTE — Progress Notes (Deleted)
PICC line in the left upper arm swollen tender and warm to touch. MD notified. MD to see patient and evaluate the right arm and PICC. Right arm elevated and warm compress applied.

## 2022-08-09 ENCOUNTER — Telehealth: Payer: Self-pay | Admitting: *Deleted

## 2022-08-09 ENCOUNTER — Telehealth: Payer: Self-pay | Admitting: Cardiovascular Disease

## 2022-08-09 DIAGNOSIS — N390 Urinary tract infection, site not specified: Secondary | ICD-10-CM | POA: Diagnosis not present

## 2022-08-09 DIAGNOSIS — E785 Hyperlipidemia, unspecified: Secondary | ICD-10-CM | POA: Diagnosis not present

## 2022-08-09 DIAGNOSIS — J181 Lobar pneumonia, unspecified organism: Secondary | ICD-10-CM | POA: Diagnosis not present

## 2022-08-09 DIAGNOSIS — D631 Anemia in chronic kidney disease: Secondary | ICD-10-CM | POA: Diagnosis not present

## 2022-08-09 DIAGNOSIS — K219 Gastro-esophageal reflux disease without esophagitis: Secondary | ICD-10-CM | POA: Diagnosis not present

## 2022-08-09 DIAGNOSIS — N179 Acute kidney failure, unspecified: Secondary | ICD-10-CM | POA: Diagnosis not present

## 2022-08-09 DIAGNOSIS — Z9981 Dependence on supplemental oxygen: Secondary | ICD-10-CM | POA: Diagnosis not present

## 2022-08-09 DIAGNOSIS — J9622 Acute and chronic respiratory failure with hypercapnia: Secondary | ICD-10-CM | POA: Diagnosis not present

## 2022-08-09 DIAGNOSIS — J440 Chronic obstructive pulmonary disease with acute lower respiratory infection: Secondary | ICD-10-CM | POA: Diagnosis not present

## 2022-08-09 DIAGNOSIS — J9621 Acute and chronic respiratory failure with hypoxia: Secondary | ICD-10-CM | POA: Diagnosis not present

## 2022-08-09 DIAGNOSIS — N1831 Chronic kidney disease, stage 3a: Secondary | ICD-10-CM | POA: Diagnosis not present

## 2022-08-09 DIAGNOSIS — I129 Hypertensive chronic kidney disease with stage 1 through stage 4 chronic kidney disease, or unspecified chronic kidney disease: Secondary | ICD-10-CM | POA: Diagnosis not present

## 2022-08-09 NOTE — Progress Notes (Signed)
  Care Coordination  Outreach Note  08/09/2022 Name: Gerald Valdez MRN: [id number] DOB: 1928-12-27   Care Coordination Outreach Attempts: An unsuccessful telephone outreach was attempted today to offer the patient information about available care coordination services.  Follow Up Plan:  Additional outreach attempts will be made to offer the patient care coordination information and services.   Encounter Outcome:  No Answer  Christie Nottingham  Care Coordination Care Guide  Direct Dial: [phone]

## 2022-08-09 NOTE — Telephone Encounter (Signed)
   Per pt's daughter, pt is requesting if he can switch heart doctor from Dr. Elease Hashimoto to Dr. Izora Ribas. They saw Dr. Izora Ribas in the hospital and wants to stay under his care.

## 2022-08-10 DIAGNOSIS — R0902 Hypoxemia: Secondary | ICD-10-CM | POA: Diagnosis not present

## 2022-08-10 DIAGNOSIS — R410 Disorientation, unspecified: Secondary | ICD-10-CM | POA: Diagnosis not present

## 2022-08-10 DIAGNOSIS — J189 Pneumonia, unspecified organism: Secondary | ICD-10-CM | POA: Diagnosis not present

## 2022-08-10 DIAGNOSIS — R0689 Other abnormalities of breathing: Secondary | ICD-10-CM | POA: Diagnosis not present

## 2022-08-10 DIAGNOSIS — R000 Tachycardia, unspecified: Secondary | ICD-10-CM | POA: Diagnosis not present

## 2022-08-10 NOTE — Progress Notes (Signed)
  Care Coordination   Note   08/10/2022 Name: Gerald Valdez MRN: [id number] DOB: 11-Sep-1928  Gerald Valdez is a 89 y.o. year old male who sees Avva, Ravisankar, MD for primary care. I reached out to [name] by phone today to offer care coordination services.  [name] was given information about Care Coordination services today including:   The Care Coordination services include support from the care team which includes your Nurse Coordinator, Clinical Social Worker, or Pharmacist.  The Care Coordination team is here to help remove barriers to the health concerns and goals most important to you. Care Coordination services are voluntary, and the patient may decline or stop services at any time by request to their care team member.   Care Coordination Consent Status: Patient agreed to services and verbal consent obtained.   Follow up plan:  Telephone appointment with care coordination team member scheduled for:  08/18/22  Encounter Outcome:  Pt. Scheduled  [facility] Coordination Care Guide  Direct Dial: [phone]

## 2022-08-11 ENCOUNTER — Encounter (HOSPITAL_COMMUNITY): Payer: Self-pay

## 2022-08-11 ENCOUNTER — Emergency Department (HOSPITAL_COMMUNITY): Payer: Medicare Other

## 2022-08-11 ENCOUNTER — Inpatient Hospital Stay (HOSPITAL_COMMUNITY)
Admission: EM | Admit: 2022-08-11 | Discharge: 2022-08-15 | DRG: 291 | Disposition: A | Discharge status: Home Health | Payer: Medicare Other | Attending: Internal Medicine | Admitting: Internal Medicine

## 2022-08-11 ENCOUNTER — Other Ambulatory Visit: Payer: Self-pay

## 2022-08-11 DIAGNOSIS — Z79899 Other long term (current) drug therapy: Secondary | ICD-10-CM

## 2022-08-11 DIAGNOSIS — J441 Chronic obstructive pulmonary disease with (acute) exacerbation: Secondary | ICD-10-CM | POA: Diagnosis not present

## 2022-08-11 DIAGNOSIS — J9621 Acute and chronic respiratory failure with hypoxia: Secondary | ICD-10-CM | POA: Diagnosis present

## 2022-08-11 DIAGNOSIS — I480 Paroxysmal atrial fibrillation: Secondary | ICD-10-CM | POA: Diagnosis present

## 2022-08-11 DIAGNOSIS — I5043 Acute on chronic combined systolic (congestive) and diastolic (congestive) heart failure: Secondary | ICD-10-CM

## 2022-08-11 DIAGNOSIS — F03A0 Unspecified dementia, mild, without behavioral disturbance, psychotic disturbance, mood disturbance, and anxiety: Secondary | ICD-10-CM | POA: Diagnosis present

## 2022-08-11 DIAGNOSIS — J9601 Acute respiratory failure with hypoxia: Secondary | ICD-10-CM | POA: Diagnosis present

## 2022-08-11 DIAGNOSIS — Z66 Do not resuscitate: Secondary | ICD-10-CM | POA: Diagnosis present

## 2022-08-11 DIAGNOSIS — K219 Gastro-esophageal reflux disease without esophagitis: Secondary | ICD-10-CM | POA: Diagnosis present

## 2022-08-11 DIAGNOSIS — J439 Emphysema, unspecified: Secondary | ICD-10-CM | POA: Diagnosis present

## 2022-08-11 DIAGNOSIS — E785 Hyperlipidemia, unspecified: Secondary | ICD-10-CM | POA: Diagnosis present

## 2022-08-11 DIAGNOSIS — Z515 Encounter for palliative care: Secondary | ICD-10-CM

## 2022-08-11 DIAGNOSIS — F419 Anxiety disorder, unspecified: Secondary | ICD-10-CM | POA: Diagnosis present

## 2022-08-11 DIAGNOSIS — E871 Hypo-osmolality and hyponatremia: Secondary | ICD-10-CM | POA: Diagnosis present

## 2022-08-11 DIAGNOSIS — J9622 Acute and chronic respiratory failure with hypercapnia: Secondary | ICD-10-CM | POA: Diagnosis present

## 2022-08-11 DIAGNOSIS — Z0389 Encounter for observation for other suspected diseases and conditions ruled out: Secondary | ICD-10-CM | POA: Diagnosis not present

## 2022-08-11 DIAGNOSIS — J9612 Chronic respiratory failure with hypercapnia: Secondary | ICD-10-CM | POA: Diagnosis not present

## 2022-08-11 DIAGNOSIS — G9340 Encephalopathy, unspecified: Secondary | ICD-10-CM

## 2022-08-11 DIAGNOSIS — Z85828 Personal history of other malignant neoplasm of skin: Secondary | ICD-10-CM | POA: Diagnosis not present

## 2022-08-11 DIAGNOSIS — Z8551 Personal history of malignant neoplasm of bladder: Secondary | ICD-10-CM

## 2022-08-11 DIAGNOSIS — F32A Depression, unspecified: Secondary | ICD-10-CM | POA: Diagnosis present

## 2022-08-11 DIAGNOSIS — I5022 Chronic systolic (congestive) heart failure: Secondary | ICD-10-CM | POA: Diagnosis present

## 2022-08-11 DIAGNOSIS — I700 Atherosclerosis of aorta: Secondary | ICD-10-CM | POA: Diagnosis present

## 2022-08-11 DIAGNOSIS — R4189 Other symptoms and signs involving cognitive functions and awareness: Secondary | ICD-10-CM | POA: Diagnosis present

## 2022-08-11 DIAGNOSIS — R569 Unspecified convulsions: Secondary | ICD-10-CM

## 2022-08-11 DIAGNOSIS — I5023 Acute on chronic systolic (congestive) heart failure: Secondary | ICD-10-CM | POA: Diagnosis present

## 2022-08-11 DIAGNOSIS — C679 Malignant neoplasm of bladder, unspecified: Secondary | ICD-10-CM | POA: Diagnosis present

## 2022-08-11 DIAGNOSIS — Z9981 Dependence on supplemental oxygen: Secondary | ICD-10-CM

## 2022-08-11 DIAGNOSIS — I517 Cardiomegaly: Secondary | ICD-10-CM | POA: Diagnosis not present

## 2022-08-11 DIAGNOSIS — E875 Hyperkalemia: Secondary | ICD-10-CM | POA: Diagnosis present

## 2022-08-11 DIAGNOSIS — N1831 Chronic kidney disease, stage 3a: Secondary | ICD-10-CM | POA: Diagnosis present

## 2022-08-11 DIAGNOSIS — G9341 Metabolic encephalopathy: Secondary | ICD-10-CM | POA: Diagnosis not present

## 2022-08-11 DIAGNOSIS — Z823 Family history of stroke: Secondary | ICD-10-CM

## 2022-08-11 DIAGNOSIS — I10 Essential (primary) hypertension: Secondary | ICD-10-CM | POA: Diagnosis present

## 2022-08-11 DIAGNOSIS — I130 Hypertensive heart and chronic kidney disease with heart failure and stage 1 through stage 4 chronic kidney disease, or unspecified chronic kidney disease: Principal | ICD-10-CM | POA: Diagnosis present

## 2022-08-11 DIAGNOSIS — Z87891 Personal history of nicotine dependence: Secondary | ICD-10-CM | POA: Diagnosis not present

## 2022-08-11 DIAGNOSIS — I3139 Other pericardial effusion (noninflammatory): Secondary | ICD-10-CM | POA: Diagnosis not present

## 2022-08-11 DIAGNOSIS — J9602 Acute respiratory failure with hypercapnia: Secondary | ICD-10-CM | POA: Diagnosis present

## 2022-08-11 DIAGNOSIS — Z8249 Family history of ischemic heart disease and other diseases of the circulatory system: Secondary | ICD-10-CM

## 2022-08-11 DIAGNOSIS — I2510 Atherosclerotic heart disease of native coronary artery without angina pectoris: Secondary | ICD-10-CM | POA: Diagnosis present

## 2022-08-11 LAB — I-STAT CHEM 8, ED
BUN: 21 mg/dL (ref 8–23)
Calcium, Ion: 1.07 mmol/L — ABNORMAL LOW (ref 1.15–1.40)
Chloride: 85 mmol/L — ABNORMAL LOW (ref 98–111)
Creatinine, Ser: 1.5 mg/dL — ABNORMAL HIGH (ref 0.61–1.24)
Glucose, Bld: 163 mg/dL — ABNORMAL HIGH (ref 70–99)
HCT: 33 % — ABNORMAL LOW (ref 39.0–52.0)
Hemoglobin: 11.2 g/dL — ABNORMAL LOW (ref 13.0–17.0)
Potassium: 5.1 mmol/L (ref 3.5–5.1)
Sodium: 120 mmol/L — ABNORMAL LOW (ref 135–145)
TCO2: 31 mmol/L (ref 22–32)

## 2022-08-11 LAB — BASIC METABOLIC PANEL
Anion gap: 9 (ref 5–15)
BUN: 19 mg/dL (ref 8–23)
CO2: 30 mmol/L (ref 22–32)
Calcium: 8.5 mg/dL — ABNORMAL LOW (ref 8.9–10.3)
Chloride: 86 mmol/L — ABNORMAL LOW (ref 98–111)
Creatinine, Ser: 1.54 mg/dL — ABNORMAL HIGH (ref 0.61–1.24)
GFR, Estimated: 42 mL/min — ABNORMAL LOW (ref >60)
Glucose, Bld: 151 mg/dL — ABNORMAL HIGH (ref 70–99)
Potassium: 5.3 mmol/L — ABNORMAL HIGH (ref 3.5–5.1)
Sodium: 125 mmol/L — ABNORMAL LOW (ref 135–145)

## 2022-08-11 LAB — CBC WITH DIFFERENTIAL/PLATELET
Abs Immature Granulocytes: 0.16 10*3/uL — ABNORMAL HIGH (ref 0.00–0.07)
Abs Immature Granulocytes: 0.22 10*3/uL — ABNORMAL HIGH (ref 0.00–0.07)
Basophils Absolute: 0 10*3/uL (ref 0.0–0.1)
Basophils Absolute: 0 10*3/uL (ref 0.0–0.1)
Basophils Relative: 0 %
Basophils Relative: 0 %
Eosinophils Absolute: 0 10*3/uL (ref 0.0–0.5)
Eosinophils Absolute: 0 10*3/uL (ref 0.0–0.5)
Eosinophils Relative: 0 %
Eosinophils Relative: 0 %
HCT: 27.7 % — ABNORMAL LOW (ref 39.0–52.0)
HCT: 30.3 % — ABNORMAL LOW (ref 39.0–52.0)
Hemoglobin: 8.5 g/dL — ABNORMAL LOW (ref 13.0–17.0)
Hemoglobin: 9.2 g/dL — ABNORMAL LOW (ref 13.0–17.0)
Immature Granulocytes: 2 %
Immature Granulocytes: 2 %
Lymphocytes Relative: 6 %
Lymphocytes Relative: 7 %
Lymphs Abs: 0.5 10*3/uL — ABNORMAL LOW (ref 0.7–4.0)
Lymphs Abs: 0.8 10*3/uL (ref 0.7–4.0)
MCH: 28.9 pg (ref 26.0–34.0)
MCH: 29.4 pg (ref 26.0–34.0)
MCHC: 30.4 g/dL (ref 30.0–36.0)
MCHC: 30.7 g/dL (ref 30.0–36.0)
MCV: 94.2 fL (ref 80.0–100.0)
MCV: 96.8 fL (ref 80.0–100.0)
Monocytes Absolute: 0.4 10*3/uL (ref 0.1–1.0)
Monocytes Absolute: 1.5 10*3/uL — ABNORMAL HIGH (ref 0.1–1.0)
Monocytes Relative: 14 %
Monocytes Relative: 4 %
Neutro Abs: 7.8 10*3/uL — ABNORMAL HIGH (ref 1.7–7.7)
Neutro Abs: 8.3 10*3/uL — ABNORMAL HIGH (ref 1.7–7.7)
Neutrophils Relative %: 77 %
Neutrophils Relative %: 88 %
Platelets: 217 10*3/uL (ref 150–400)
Platelets: 260 10*3/uL (ref 150–400)
RBC: 2.94 MIL/uL — ABNORMAL LOW (ref 4.22–5.81)
RBC: 3.13 MIL/uL — ABNORMAL LOW (ref 4.22–5.81)
RDW: 13 % (ref 11.5–15.5)
RDW: 13.1 % (ref 11.5–15.5)
WBC: 10.9 10*3/uL — ABNORMAL HIGH (ref 4.0–10.5)
WBC: 8.9 10*3/uL (ref 4.0–10.5)
nRBC: 0 % (ref 0.0–0.2)
nRBC: 0.3 % — ABNORMAL HIGH (ref 0.0–0.2)

## 2022-08-11 LAB — COMPREHENSIVE METABOLIC PANEL
ALT: 77 U/L — ABNORMAL HIGH (ref 0–44)
AST: 52 U/L — ABNORMAL HIGH (ref 15–41)
Albumin: 3.5 g/dL (ref 3.5–5.0)
Alkaline Phosphatase: 38 U/L (ref 38–126)
Anion gap: 9 (ref 5–15)
BUN: 17 mg/dL (ref 8–23)
CO2: 29 mmol/L (ref 22–32)
Calcium: 8.5 mg/dL — ABNORMAL LOW (ref 8.9–10.3)
Chloride: 85 mmol/L — ABNORMAL LOW (ref 98–111)
Creatinine, Ser: 1.46 mg/dL — ABNORMAL HIGH (ref 0.61–1.24)
GFR, Estimated: 45 mL/min — ABNORMAL LOW (ref >60)
Glucose, Bld: 164 mg/dL — ABNORMAL HIGH (ref 70–99)
Potassium: 5.2 mmol/L — ABNORMAL HIGH (ref 3.5–5.1)
Sodium: 123 mmol/L — ABNORMAL LOW (ref 135–145)
Total Bilirubin: 0.7 mg/dL (ref 0.3–1.2)
Total Protein: 6.1 g/dL — ABNORMAL LOW (ref 6.5–8.1)

## 2022-08-11 LAB — I-STAT VENOUS BLOOD GAS, ED
Acid-Base Excess: 4 mmol/L — ABNORMAL HIGH (ref 0.0–2.0)
Bicarbonate: 31.2 mmol/L — ABNORMAL HIGH (ref 20.0–28.0)
Calcium, Ion: 1.09 mmol/L — ABNORMAL LOW (ref 1.15–1.40)
HCT: 32 % — ABNORMAL LOW (ref 39.0–52.0)
Hemoglobin: 10.9 g/dL — ABNORMAL LOW (ref 13.0–17.0)
O2 Saturation: 72 %
Potassium: 5.2 mmol/L — ABNORMAL HIGH (ref 3.5–5.1)
Sodium: 121 mmol/L — ABNORMAL LOW (ref 135–145)
TCO2: 33 mmol/L — ABNORMAL HIGH (ref 22–32)
pCO2, Ven: 63.1 mmHg — ABNORMAL HIGH (ref 44–60)
pH, Ven: 7.302 (ref 7.25–7.43)
pO2, Ven: 43 mmHg (ref 32–45)

## 2022-08-11 LAB — TROPONIN I (HIGH SENSITIVITY)
Troponin I (High Sensitivity): 15 ng/L (ref <18)
Troponin I (High Sensitivity): 15 ng/L (ref <18)

## 2022-08-11 LAB — URINALYSIS, W/ REFLEX TO CULTURE (INFECTION SUSPECTED)
Bilirubin Urine: NEGATIVE
Glucose, UA: NEGATIVE mg/dL
Ketones, ur: NEGATIVE mg/dL
Nitrite: NEGATIVE
Protein, ur: 100 mg/dL — AB
RBC / HPF: >50 RBC/hpf (ref 0–5)
Specific Gravity, Urine: 1.017 (ref 1.005–1.030)
pH: 6 (ref 5.0–8.0)

## 2022-08-11 LAB — LACTIC ACID, PLASMA
Lactic Acid, Venous: 1.5 mmol/L (ref 0.5–1.9)
Lactic Acid, Venous: 2.2 mmol/L (ref 0.5–1.9)

## 2022-08-11 LAB — MAGNESIUM
Magnesium: 1.9 mg/dL (ref 1.7–2.4)
Magnesium: 1.8 mg/dL (ref 1.7–2.4)

## 2022-08-11 LAB — OSMOLALITY: Osmolality: 274 mOsm/kg — ABNORMAL LOW (ref 275–295)

## 2022-08-11 LAB — PROTIME-INR
INR: 1.1 (ref 0.8–1.2)
Prothrombin Time: 14.2 seconds (ref 11.4–15.2)

## 2022-08-11 LAB — TSH: TSH: 2.904 u[IU]/mL (ref 0.350–4.500)

## 2022-08-11 LAB — BRAIN NATRIURETIC PEPTIDE: B Natriuretic Peptide: 851.4 pg/mL — ABNORMAL HIGH (ref 0.0–100.0)

## 2022-08-11 MED ORDER — SODIUM CHLORIDE 0.9 % IV SOLN
1.0000 g | Freq: Once | INTRAVENOUS | Status: AC
  Administered 2022-08-11: 1 g via INTRAVENOUS
  Filled 2022-08-11: qty 10

## 2022-08-11 MED ORDER — ACETAMINOPHEN 650 MG RE SUPP: 650.0000 mg | Freq: Four times a day (QID) | RECTAL | Status: DC | PRN

## 2022-08-11 MED ORDER — ONDANSETRON HCL 4 MG PO TABS: 4.0000 mg | ORAL_TABLET | Freq: Four times a day (QID) | ORAL | Status: DC | PRN

## 2022-08-11 MED ORDER — SODIUM CHLORIDE 0.9 % IV SOLN
500.0000 mg | Freq: Once | INTRAVENOUS | Status: AC
  Administered 2022-08-11: 500 mg via INTRAVENOUS
  Filled 2022-08-11: qty 5

## 2022-08-11 MED ORDER — METHYLPREDNISOLONE SODIUM SUCC 40 MG IJ SOLR: 40.0000 mg | INTRAMUSCULAR | Status: DC

## 2022-08-11 MED ORDER — SODIUM CHLORIDE 0.9 % IV SOLN: 1.0000 g | INTRAVENOUS | Status: DC

## 2022-08-11 MED ORDER — ALBUTEROL SULFATE (2.5 MG/3ML) 0.083% IN NEBU
10.0000 mg | INHALATION_SOLUTION | Freq: Once | RESPIRATORY_TRACT | Status: AC
  Administered 2022-08-11: 10 mg via RESPIRATORY_TRACT
  Filled 2022-08-11: qty 12

## 2022-08-11 MED ORDER — ONDANSETRON HCL 4 MG/2ML IJ SOLN: 4.0000 mg | Freq: Four times a day (QID) | INTRAMUSCULAR | Status: DC | PRN

## 2022-08-11 MED ORDER — IPRATROPIUM-ALBUTEROL 0.5-2.5 (3) MG/3ML IN SOLN: 3.0000 mL | Freq: Once | RESPIRATORY_TRACT | Status: DC

## 2022-08-11 MED ORDER — SODIUM CHLORIDE 0.9 % IV BOLUS: 500.0000 mL | Freq: Once | INTRAVENOUS | Status: DC

## 2022-08-11 MED ORDER — FUROSEMIDE 10 MG/ML IJ SOLN
40.0000 mg | Freq: Two times a day (BID) | INTRAMUSCULAR | Status: DC
  Administered 2022-08-11 – 2022-08-13 (×4): 40 mg via INTRAVENOUS
  Filled 2022-08-11 (×4): qty 4

## 2022-08-11 MED ORDER — FUROSEMIDE 10 MG/ML IJ SOLN
20.0000 mg | Freq: Two times a day (BID) | INTRAMUSCULAR | Status: DC
  Administered 2022-08-11: 20 mg via INTRAVENOUS
  Filled 2022-08-11: qty 2

## 2022-08-11 MED ORDER — CHLORHEXIDINE GLUCONATE CLOTH 2 % EX PADS
6.0000 | MEDICATED_PAD | Freq: Every day | CUTANEOUS | Status: DC
  Administered 2022-08-11 – 2022-08-15 (×5): 6 via TOPICAL

## 2022-08-11 MED ORDER — ACETAMINOPHEN 325 MG PO TABS: 650.0000 mg | ORAL_TABLET | Freq: Four times a day (QID) | ORAL | Status: DC | PRN

## 2022-08-11 MED ORDER — LORAZEPAM 2 MG/ML IJ SOLN: 0.5000 mg | Freq: Once | INTRAMUSCULAR | Status: DC | PRN

## 2022-08-11 MED ORDER — SODIUM CHLORIDE 0.9 % IV SOLN: 500.0000 mg | INTRAVENOUS | Status: DC

## 2022-08-11 MED ORDER — IPRATROPIUM-ALBUTEROL 0.5-2.5 (3) MG/3ML IN SOLN
3.0000 mL | Freq: Four times a day (QID) | RESPIRATORY_TRACT | Status: DC
  Administered 2022-08-11 – 2022-08-12 (×4): 3 mL via RESPIRATORY_TRACT
  Filled 2022-08-11 (×4): qty 3

## 2022-08-11 MED ORDER — LORAZEPAM 2 MG/ML IJ SOLN
0.5000 mg | Freq: Once | INTRAMUSCULAR | Status: AC
  Administered 2022-08-11: 0.5 mg via INTRAVENOUS
  Filled 2022-08-11: qty 1

## 2022-08-11 MED ORDER — METHYLPREDNISOLONE SODIUM SUCC 125 MG IJ SOLR
125.0000 mg | Freq: Once | INTRAMUSCULAR | Status: AC
  Administered 2022-08-11: 125 mg via INTRAVENOUS
  Filled 2022-08-11: qty 2

## 2022-08-11 NOTE — Progress Notes (Signed)
   08/11/22 0832  BiPAP/CPAP/SIPAP  BiPAP/CPAP/SIPAP Pt Type Adult  BiPAP/CPAP/SIPAP V60  Mask Type Full face mask  Mask Size Large  Set Rate 8 breaths/min  Respiratory Rate 14 breaths/min  IPAP 14 cmH20  EPAP 7 cmH2O  PEEP 5 cmH20  FiO2 (%) 40 %  Minute Ventilation 6.8  Leak 0  Peak Inspiratory Pressure (PIP) 14  Tidal Volume (Vt) 615  Patient Home Equipment No  Auto Titrate No  Press High Alarm 25 cmH2O  Press Low Alarm 5 cmH2O  BiPAP/CPAP /SiPAP Vitals  Bilateral Breath Sounds Diminished

## 2022-08-11 NOTE — ED Notes (Signed)
Pt starting to pull at cords and BIPAP, not redirectable. Medicated per orders. No acute distress noted. Family @ bedside.

## 2022-08-11 NOTE — Progress Notes (Signed)
   08/11/22 0404  Assess: MEWS Score  Temp 97.7 F (36.5 C)  BP (!) 92/52  MAP (mmHg) 65  Pulse Rate 70  ECG Heart Rate 70  Resp (!) 21  Level of Consciousness Responds to Pain  SpO2 100 %  O2 Device Bi-PAP  Assess: MEWS Score  MEWS Temp 0  MEWS Systolic 1  MEWS Pulse 0  MEWS RR 1  MEWS LOC 2  MEWS Score 4  MEWS Score Color Red  Assess: if the MEWS score is Yellow or Red  Were vital signs taken at a resting state? Yes  Focused Assessment No change from prior assessment  Does the patient meet 2 or more of the SIRS criteria? Yes  Does the patient have a confirmed or suspected source of infection? Yes  MEWS guidelines implemented  Yes, red  Treat  MEWS Interventions Considered administering scheduled or prn medications/treatments as ordered  Take Vital Signs  Increase Vital Sign Frequency  Red: Q1hr x2, continue Q4hrs until patient remains green for 12hrs  Escalate  MEWS: Escalate Red: Discuss with charge nurse and notify provider. Consider notifying RRT. If remains red for 2 hours consider need for higher level of care  Notify: Charge Nurse/RN  Name of Charge Nurse/RN Notified Sallee Lange  Assess: SIRS CRITERIA  SIRS Temperature  0  SIRS Pulse 0  SIRS Respirations  1  SIRS WBC 0  SIRS Score Sum  1

## 2022-08-11 NOTE — ED Notes (Signed)
ED TO INPATIENT HANDOFF REPORT  ED Nurse Name and Phone #: Jodie Echevaria 59  S Name/Age/Gender Gerald Valdez 89 y.o. male Room/Bed: 025C/025C  Code Status   Code Status: Prior  Home/SNF/Other Home Patient oriented to: self, place Is this baseline? Yes   Triage Complete: Triage complete  Chief Complaint Acute respiratory failure with hypercapnia (HCC) [J96.02]  Triage Note Pt BIB EMS c/o shortness of breath by family, o2 in the 70's on EMS arrival. Pt on cpap upon arrival to [place].    Allergies No Known Allergies  Level of Care/Admitting Diagnosis ED Disposition     ED Disposition  Admit   Condition  --   Comment  Hospital Area: [facility] [[id number]]  Level of Care: Progressive [102]  Admit to Progressive based on following criteria: MULTISYSTEM THREATS such as stable sepsis, metabolic/electrolyte imbalance with or without encephalopathy that is responding to early treatment.  May admit patient to [facility] or [facility] if equivalent level of care is available:: No  Covid Evaluation: Confirmed COVID Negative  Diagnosis: Acute respiratory failure with hypercapnia [facility]) [[id number]]  Admitting Physician: Gery Pray [[id number]]  Attending Physician: Gery Pray [phone]  Certification:: I certify this patient will need inpatient services for at least 2 midnights  Estimated Length of Stay: 2          B Medical/Surgery History Past Medical History:  Diagnosis Date   Acute duodenal ulcer with bleeding 01/30/2014   Acute post-hemorrhagic anemia 01/30/2014   Anxiety    Aortic atherosclerosis (HCC) 02/10/2020   Arthritis    Basal cell carcinoma    Cancer (HCC)    bladder   Chronic kidney disease    COPD exacerbation (HCC) 02/10/2020   Coronary artery disease    Dyspnea    Dyspnea on exertion 02/10/2020   Emphysema of lung (HCC)    Essential hypertension 02/10/2020   GERD (gastroesophageal reflux disease) 02/10/2020   GI bleed 01/30/2014    Hyperlipidemia 02/10/2020   Past Surgical History:  Procedure Laterality Date   BLADDER SURGERY     CATARACT EXTRACTION W/ INTRAOCULAR LENS IMPLANT     CYSTOSCOPY W/ RETROGRADES Bilateral 11/20/2020   Procedure: CYSTOSCOPY WITH RETROGRADE PYELOGRAM;  Surgeon: Marcine Matar, MD;  [facility];  Service: Urology;  Laterality: Bilateral;   CYSTOSCOPY W/ RETROGRADES Right 02/12/2021   Procedure: CYSTOSCOPY WITH RETROGRADE PYELOGRAM;  Surgeon: Marcine Matar, MD;  [facility];  Service: Urology;  Laterality: Right;   CYSTOSCOPY W/ RETROGRADES Bilateral 06/25/2021   Procedure: CYSTOSCOPY WITH RETROGRADE PYELOGRAM;  Surgeon: Marcine Matar, MD;  [facility];  Service: Urology;  Laterality: Bilateral;   ESOPHAGOGASTRODUODENOSCOPY N/A 01/30/2014   Procedure: ESOPHAGOGASTRODUODENOSCOPY (EGD);  Surgeon: Louis Meckel, MD;  Location[facility];  Service: Endoscopy;  Laterality: N/A;   TONSILLECTOMY     TRANSURETHRAL RESECTION OF BLADDER TUMOR N/A 02/12/2021   Procedure: REPEAT TRANSURETHRAL RESECTION OF BLADDER TUMOR (TURBT);  Surgeon: Marcine Matar, MD;  [facility];  Service: Urology;  Laterality: N/A;   TRANSURETHRAL RESECTION OF BLADDER TUMOR WITH MITOMYCIN-C N/A 11/20/2020   Procedure: TRANSURETHRAL RESECTION OF BLADDER TUMOR;  Surgeon: Marcine Matar, MD;  [facility];  Service: Urology;  Laterality: N/A;   TRANSURETHRAL RESECTION OF BLADDER TUMOR WITH MITOMYCIN-C N/A 06/25/2021   Procedure: TRANSURETHRAL RESECTION OF BLADDER TUMOR WITH GEMCITABINE;  Surgeon: Marcine Matar, MD;  [facility];  Service: Urology;  Laterality: N/A;  1 HR   TRANSURETHRAL RESECTION OF BLADDER TUMOR WITH MITOMYCIN-C N/A  12/10/2021   Procedure: TRANSURETHRAL RESECTION OF BLADDER TUMOR WITH GEMCITABINE;  Surgeon: Marcine Matar, MD;  [facility];  Service: Urology;  Laterality: N/A;  1 HR   TRANSURETHRAL RESECTION OF BLADDER TUMOR WITH MITOMYCIN-C N/A  05/27/2022   Procedure: TRANSURETHRAL RESECTION OF BLADDER TUMOR;  Surgeon: Marcine Matar, MD;  [facility];  Service: Urology;  Laterality: N/A;     A IV Location/Drains/Wounds Patient Lines/Drains/Airways Status     Active Line/Drains/Airways     Name Placement date Placement time Site Days   Peripheral IV 08/11/22 18 G 1" Anterior;Left;Upper Arm 08/11/22  0022  Arm  less than 1   Urethral Catheter Janene RN Non-latex 16 Fr. 08/06/22  1450  Non-latex  5            Intake/Output Last 24 hours No intake or output data in the 24 hours ending 08/11/22 0232  Labs/Imaging Results for orders placed or performed during the hospital encounter of 08/11/22 (from the past 48 hour(s))  Lactic acid, plasma     Status: None   Collection Time: 08/11/22 12:20 AM  Result Value Ref Range   Lactic Acid, Venous 1.5 0.5 - 1.9 mmol/L    Comment: Performed at [facility], [address], [place], [place] [address]  Comprehensive metabolic panel     Status: Abnormal   Collection Time: 08/11/22 12:20 AM  Result Value Ref Range   Sodium 123 (L) 135 - 145 mmol/L   Potassium 5.2 (H) 3.5 - 5.1 mmol/L   Chloride 85 (L) 98 - 111 mmol/L   CO2 29 22 - 32 mmol/L   Glucose, Bld 164 (H) 70 - 99 mg/dL    Comment: Glucose reference range applies only to samples taken after fasting for at least 8 hours.   BUN 17 8 - 23 mg/dL   Creatinine, Ser 5.28 (H) 0.61 - 1.24 mg/dL   Calcium 8.5 (L) 8.9 - 10.3 mg/dL   Total Protein 6.1 (L) 6.5 - 8.1 g/dL   Albumin 3.5 3.5 - 5.0 g/dL   Valdez 52 (H) 15 - 41 U/L   ALT 77 (H) 0 - 44 U/L   Alkaline Phosphatase 38 38 - 126 U/L   Total Bilirubin 0.7 0.3 - 1.2 mg/dL   GFR, Estimated 45 (L) >60 mL/min    Comment: (NOTE) Calculated using the CKD-EPI Creatinine Equation (2021)    Anion gap 9 5 - 15    Comment: Performed at [facility], [address], [place], [place] [address]  CBC with Differential     Status: Abnormal   Collection Time: 08/11/22  12:20 AM  Result Value Ref Range   WBC 10.9 (H) 4.0 - 10.5 K/uL   RBC 3.13 (L) 4.22 - 5.81 MIL/uL   Hemoglobin 9.2 (L) 13.0 - 17.0 g/dL   HCT 40.1 (L) 02.7 - 25.3 %   MCV 96.8 80.0 - 100.0 fL   MCH 29.4 26.0 - 34.0 pg   MCHC 30.4 30.0 - 36.0 g/dL   RDW 66.4 40.3 - 47.4 %   Platelets 260 150 - 400 K/uL   nRBC 0.3 (H) 0.0 - 0.2 %   Neutrophils Relative % 77 %   Neutro Abs 8.3 (H) 1.7 - 7.7 K/uL   Lymphocytes Relative 7 %   Lymphs Abs 0.8 0.7 - 4.0 K/uL   Monocytes Relative 14 %   Monocytes Absolute 1.5 (H) 0.1 - 1.0 K/uL   Eosinophils Relative 0 %   Eosinophils Absolute  0.0 0.0 - 0.5 K/uL   Basophils Relative 0 %   Basophils Absolute 0.0 0.0 - 0.1 K/uL   Immature Granulocytes 2 %   Abs Immature Granulocytes 0.22 (H) 0.00 - 0.07 K/uL    Comment: Performed at [facility], [address], [place], [place] [address]  Protime-INR     Status: None   Collection Time: 08/11/22 12:20 AM  Result Value Ref Range   Prothrombin Time 14.2 11.4 - 15.2 seconds   INR 1.1 0.8 - 1.2    Comment: (NOTE) INR goal varies based on device and disease states. Performed at [facility], [address] [place], [place] [address]   Brain natriuretic peptide     Status: Abnormal   Collection Time: 08/11/22 12:20 AM  Result Value Ref Range   B Natriuretic Peptide 851.4 (H) 0.0 - 100.0 pg/mL    Comment: Performed at [facility], [address], [place], [place] [address]  Troponin I (High Sensitivity)     Status: None   Collection Time: 08/11/22 12:20 AM  Result Value Ref Range   Troponin I (High Sensitivity) 15 <18 ng/L    Comment: (NOTE) Elevated high sensitivity troponin I (hsTnI) values and significant  changes across serial measurements may suggest ACS but many other  chronic and acute conditions are known to elevate hsTnI results.  Refer to the "Links" section for chest pain algorithms and additional  guidance. Performed at [facility], [address]  [place], [place] [address]   Magnesium     Status: None   Collection Time: 08/11/22 12:20 AM  Result Value Ref Range   Magnesium 1.9 1.7 - 2.4 mg/dL    Comment: Performed at [facility], [address], [place], [place] [address]  I-Stat Chem 8, ED     Status: Abnormal   Collection Time: 08/11/22 12:31 AM  Result Value Ref Range   Sodium 120 (L) 135 - 145 mmol/L   Potassium 5.1 3.5 - 5.1 mmol/L   Chloride 85 (L) 98 - 111 mmol/L   BUN 21 8 - 23 mg/dL   Creatinine, Ser 4.40 (H) 0.61 - 1.24 mg/dL   Glucose, Bld 102 (H) 70 - 99 mg/dL    Comment: Glucose reference range applies only to samples taken after fasting for at least 8 hours.   Calcium, Ion 1.07 (L) 1.15 - 1.40 mmol/L   TCO2 31 22 - 32 mmol/L   Hemoglobin 11.2 (L) 13.0 - 17.0 g/dL   HCT 72.5 (L) 36.6 - 44.0 %  I-Stat venous blood gas, ED ([facility])     Status: Abnormal   Collection Time: 08/11/22 12:31 AM  Result Value Ref Range   pH, Ven 7.302 7.25 - 7.43   pCO2, Ven 63.1 (H) 44 - 60 mmHg   pO2, Ven 43 32 - 45 mmHg   Bicarbonate 31.2 (H) 20.0 - 28.0 mmol/L   TCO2 33 (H) 22 - 32 mmol/L   O2 Saturation 72 %   Acid-Base Excess 4.0 (H) 0.0 - 2.0 mmol/L   Sodium 121 (L) 135 - 145 mmol/L   Potassium 5.2 (H) 3.5 - 5.1 mmol/L   Calcium, Ion 1.09 (L) 1.15 - 1.40 mmol/L   HCT 32.0 (L) 39.0 - 52.0 %   Hemoglobin 10.9 (L) 13.0 - 17.0 g/dL   Sample type VENOUS   Urinalysis, w/ Reflex to Culture (Infection Suspected) -Urine, Catheterized; Indwelling urinary catheter     Status: Abnormal  Collection Time: 08/11/22 12:39 AM  Result Value Ref Range   Specimen Source URINE, CATHETERIZED    Color, Urine AMBER (A) YELLOW    Comment: BIOCHEMICALS MAY BE AFFECTED BY COLOR   APPearance CLOUDY (A) CLEAR   Specific Gravity, Urine 1.017 1.005 - 1.030   pH 6.0 5.0 - 8.0   Glucose, UA NEGATIVE NEGATIVE mg/dL   Hgb urine dipstick LARGE (A) NEGATIVE   Bilirubin Urine NEGATIVE NEGATIVE   Ketones, ur NEGATIVE NEGATIVE mg/dL   Protein, ur 161  (A) NEGATIVE mg/dL   Nitrite NEGATIVE NEGATIVE   Leukocytes,Ua TRACE (A) NEGATIVE   RBC / HPF >50 0 - 5 RBC/hpf   WBC, UA 0-5 0 - 5 WBC/hpf    Comment:        Reflex urine culture not performed if WBC <=10, OR if Squamous epithelial cells >5. If Squamous epithelial cells >5 suggest recollection.    Bacteria, UA FEW (A) NONE SEEN   Squamous Epithelial / HPF 0-5 0 - 5 /HPF   Mucus PRESENT    Non Squamous Epithelial 0-5 (A) NONE SEEN    Comment: Performed at [facility], [address] [place], [place] [address]   DG Chest Port 1 View  Result Date: 08/11/2022 CLINICAL DATA:  Possible sepsis EXAM: PORTABLE CHEST 1 VIEW COMPARISON:  08/03/2022 FINDINGS: Cardiac shadow is enlarged. Aortic calcifications are again seen. Lungs are well aerated without focal infiltrate or effusion. No bony abnormality is noted. IMPRESSION: No active disease. Electronically Signed   By: Alcide Clever M.D.   On: 08/11/2022 00:30    Pending Labs Unresulted Labs (From admission, onward)     Start     Ordered   08/11/22 0109  Legionella Pneumophila Serogp 1 Ur Ag  Once,   URGENT        08/11/22 0108   08/11/22 0012  Lactic acid, plasma  (Undifferentiated presentation (screening labs and basic nursing orders))  Now then every 2 hours,   R (with STAT occurrences)      08/11/22 0012   08/11/22 0012  Blood Culture (routine x 2)  (Undifferentiated presentation (screening labs and basic nursing orders))  BLOOD CULTURE X 2,   STAT      08/11/22 0012            Vitals/Pain Today's Vitals   08/11/22 0022 08/11/22 0045 08/11/22 0115 08/11/22 0200  BP:  (!) 127/90 (!) 114/36 101/67  Pulse:  84 84 91  Resp:  (!) 22 (!) 25 17  Temp:      TempSrc:      SpO2: 100% 100% 100% 100%  Height:        Isolation Precautions No active isolations  Medications Medications  ipratropium-albuterol (DUONEB) 0.5-2.5 (3) MG/3ML nebulizer solution 3 mL (3 mLs Nebulization Not Given 08/11/22 0023)  cefTRIAXone (ROCEPHIN)  1 g in sodium chloride 0.9 % 100 mL IVPB (1 g Intravenous New Bag/Given 08/11/22 0204)  azithromycin (ZITHROMAX) 500 mg in sodium chloride 0.9 % 250 mL IVPB (has no administration in time range)  methylPREDNISolone sodium succinate (SOLU-MEDROL) 125 mg/2 mL injection 125 mg (125 mg Intravenous Given 08/11/22 0027)  albuterol (PROVENTIL) (2.5 MG/3ML) 0.083% nebulizer solution 10 mg (10 mg Nebulization Given 08/11/22 0018)    Mobility walks with device     Focused Assessments Cardiac Assessment Handoff:    No results found for: "CKTOTAL", "CKMB", "CKMBINDEX", "TROPONINI" No results found for: "DDIMER" Does the Patient currently have chest pain? No   ,  Neuro Assessment Handoff:  Swallow screen pass?  bipap         Neuro Assessment: Exceptions to [facility] Neuro Checks:      Has TPA been given? No If patient is a Neuro Trauma and patient is going to OR before floor call report to 4N Charge nurse: [phone] or [phone]  , Pulmonary Assessment Handoff:  Lung sounds: Bilateral Breath Sounds: Diminished O2 Device: Bi-PAP      R Recommendations: See Admitting Provider Note  Report given to:   Additional Notes:

## 2022-08-11 NOTE — H&P (Signed)
PCP:   [name], MD   Chief Complaint:  Shortness of breath  HPI: This is a 89 year old male with PMH of bladder cancer with chronic indwelling foley catheter (ongoing hematuria), chronic hypoxic and hypercarbic respiratory failure on 3L oxygen,  COPD, HFpEF EF 40-45%, HTN, HLD, Anxiety and depression, chronic anemia, CKD stage 3A, seizure, and atrial fibrillation not on anticoagulation.  The patient has been somewhat confused.  There is a hard time getting him to eat or drink.  The patient has chronic shortness of breath, on 3 L baseline.  Tonight while getting patient in bed, he developed sudden onset shortness of breath.  Pulse ox ox checked was 81%.  Oxygen rate increased to 5 L patient remained hypoxic.  911 was called.  There is reports of a cough.  Mild wheeze per EMS.  Wife reports increased B/L LE edema and increased edema in left hand noted today.  In the ER patient placed on BiPAP  History provided by son and wife present at bedside  Review of Systems:  Per HPI  Past Medical History: Past Medical History:  Diagnosis Date   Acute duodenal ulcer with bleeding 01/30/2014   Acute post-hemorrhagic anemia 01/30/2014   Anxiety    Aortic atherosclerosis (HCC) 02/10/2020   Arthritis    Basal cell carcinoma    Cancer (HCC)    bladder   Chronic kidney disease    COPD exacerbation (HCC) 02/10/2020   Coronary artery disease    Dyspnea    Dyspnea on exertion 02/10/2020   Emphysema of lung (HCC)    Essential hypertension 02/10/2020   GERD (gastroesophageal reflux disease) 02/10/2020   GI bleed 01/30/2014   Hyperlipidemia 02/10/2020   Past Surgical History:  Procedure Laterality Date   BLADDER SURGERY     CATARACT EXTRACTION W/ INTRAOCULAR LENS IMPLANT     CYSTOSCOPY W/ RETROGRADES Bilateral 11/20/2020   Procedure: CYSTOSCOPY WITH RETROGRADE PYELOGRAM;  Surgeon: Marcine Matar, MD;  [facility];  Service: Urology;  Laterality: Bilateral;   CYSTOSCOPY W/  RETROGRADES Right 02/12/2021   Procedure: CYSTOSCOPY WITH RETROGRADE PYELOGRAM;  Surgeon: Marcine Matar, MD;  [facility];  Service: Urology;  Laterality: Right;   CYSTOSCOPY W/ RETROGRADES Bilateral 06/25/2021   Procedure: CYSTOSCOPY WITH RETROGRADE PYELOGRAM;  Surgeon: Marcine Matar, MD;  [facility];  Service: Urology;  Laterality: Bilateral;   ESOPHAGOGASTRODUODENOSCOPY N/A 01/30/2014   Procedure: ESOPHAGOGASTRODUODENOSCOPY (EGD);  Surgeon: Louis Meckel, MD;  Location: [facility];  Service: Endoscopy;  Laterality: N/A;   TONSILLECTOMY     TRANSURETHRAL RESECTION OF BLADDER TUMOR N/A 02/12/2021   Procedure: REPEAT TRANSURETHRAL RESECTION OF BLADDER TUMOR (TURBT);  Surgeon: Marcine Matar, MD;  [facility];  Service: Urology;  Laterality: N/A;   TRANSURETHRAL RESECTION OF BLADDER TUMOR WITH MITOMYCIN-C N/A 11/20/2020   Procedure: TRANSURETHRAL RESECTION OF BLADDER TUMOR;  Surgeon: Marcine Matar, MD;  [facility];  Service: Urology;  Laterality: N/A;   TRANSURETHRAL RESECTION OF BLADDER TUMOR WITH MITOMYCIN-C N/A 06/25/2021   Procedure: TRANSURETHRAL RESECTION OF BLADDER TUMOR WITH GEMCITABINE;  Surgeon: Marcine Matar, MD;  [facility];  Service: Urology;  Laterality: N/A;  1 HR   TRANSURETHRAL RESECTION OF BLADDER TUMOR WITH MITOMYCIN-C N/A 12/10/2021   Procedure: TRANSURETHRAL RESECTION OF BLADDER TUMOR WITH GEMCITABINE;  Surgeon: Marcine Matar, MD;  [facility];  Service: Urology;  Laterality: N/A;  1 HR   TRANSURETHRAL RESECTION OF BLADDER TUMOR WITH MITOMYCIN-C N/A 05/27/2022   Procedure: TRANSURETHRAL RESECTION OF BLADDER TUMOR;  Surgeon: Marcine Matar, MD;  [facility];  Service: Urology;  Laterality: N/A;    Medications: Prior to Admission medications   Medication Sig Start Date End Date Taking? Authorizing Provider  ascorbic acid (VITAMIN C) 500 MG tablet Take 500 mg by mouth daily.    [provider]   atorvastatin (LIPITOR) 40 MG tablet Take 1 tablet (40 mg total) by mouth daily. 07/28/22   Lanae Boast, MD  escitalopram (LEXAPRO) 10 MG tablet Take 10 mg by mouth daily.    [provider]  feeding supplement (ENSURE ENLIVE / ENSURE PLUS) LIQD Take 237 mLs by mouth 2 (two) times daily between meals. 07/27/22   Lanae Boast, MD  loratadine (CLARITIN) 10 MG tablet Take 10 mg by mouth daily.    [provider]  metoprolol succinate (TOPROL-XL) 25 MG 24 hr tablet Take 3 tablets (75 mg total) by mouth daily. 08/07/22   Jonah Blue, MD  Multiple Vitamin (MULTIVITAMIN WITH MINERALS) TABS tablet Take 1 tablet by mouth daily with breakfast.    [provider]  OXYGEN Inhale 3 L into the lungs continuous.    [provider]  pantoprazole (PROTONIX) 40 MG tablet Take 1 tablet (40 mg total) by mouth 2 (two) times daily before a meal. 08/06/22   Jonah Blue, MD  polyethylene glycol (MIRALAX / GLYCOLAX) 17 g packet Take 17 g by mouth daily as needed for mild constipation. 07/27/22   Lanae Boast, MD  saccharomyces boulardii (FLORASTOR) 250 MG capsule Take 1 capsule (250 mg total) by mouth 2 (two) times daily. 07/05/21   Leatha Gilding, MD  tamsulosin (FLOMAX) 0.4 MG CAPS capsule Take 1 capsule (0.4 mg total) by mouth daily. 08/06/22   Jonah Blue, MD  umeclidinium-vilanterol [facility] ELLIPTA) 62.5-25 MCG/ACT AEPB Inhale 1 puff into the lungs daily. 07/28/22   Lanae Boast, MD    Allergies:  No Known Allergies  Social History:  reports that he quit smoking about 30 years ago. His smoking use included cigarettes. He started smoking about 77 years ago. He has never used smokeless tobacco. He reports that he does not currently use alcohol. He reports that he does not use drugs.  Family History: Family History  Problem Relation Age of Onset   Hypertension Mother    Stroke Father     Physical Exam: Vitals:   08/11/22 0012 08/11/22 0015 08/11/22 0022 08/11/22 0045  BP:   (!) 145/115  (!) 127/90  Pulse: 88 80  84  Resp: (!) 27 16  (!) 22  Temp:      TempSrc:      SpO2: 98% 100% 100% 100%  Height:  5\' 6"  (1.676 m)      General: Poorly interactive patient.  On BiPAP Eyes: PERRLA, pink conjunctiva, no scleral icterus ENT: Moist oral mucosa, neck supple, no thyromegaly Lungs: Very poor air exchange, no use of accessory muscles Cardiovascular: RRR, no regurgitation, no gallops, no murmurs. No carotid bruits, no JVD Abdomen: soft, positive BS, NTND, no organomegaly, not an acute abdomen GU: not examined Neuro: Difficult to assess, strength appears grossly normal Musculoskeletal: >2+ B/L LE edema Skin: Scattered actinic keratosis. Psych: Appropriately interactive patient on BiPAP   Labs on Admission:  Recent Labs    08/11/22 0020 08/11/22 0031  NA 123* 121*  120*  K 5.2* 5.2*  5.1  CL 85* 85*  CO2 29  --   GLUCOSE 164* 163*  BUN 17 21  CREATININE 1.46* 1.50*  CALCIUM 8.5*  --  MG 1.9  --    Recent Labs    08/11/22 0020  AST 52*  ALT 77*  ALKPHOS 38  BILITOT 0.7  PROT 6.1*  ALBUMIN 3.5    Recent Labs    08/11/22 0020 08/11/22 0031  WBC 10.9*  --   NEUTROABS 8.3*  --   HGB 9.2* 10.9*  11.2*  HCT 30.3* 32.0*  33.0*  MCV 96.8  --   PLT 260  --      Micro Results: Recent Results (from the past 240 hour(s))  Urine Culture     Status: None   Collection Time: 08/01/22  8:10 PM   Specimen: Urine, Clean Catch  Result Value Ref Range Status   Specimen Description URINE, CLEAN CATCH  Final   Special Requests NONE  Final   Culture   Final    NO GROWTH Performed at [facility], [address], [place], [place] [address]    Report Status 08/03/2022 FINAL  Final     Radiological Exams on Admission: DG Chest Port 1 View  Result Date: 08/11/2022 CLINICAL DATA:  Possible sepsis EXAM: PORTABLE CHEST 1 VIEW COMPARISON:  08/03/2022 FINDINGS: Cardiac shadow is enlarged. Aortic calcifications are again seen. Lungs are well  aerated without focal infiltrate or effusion. No bony abnormality is noted. IMPRESSION: No active disease. Electronically Signed   By: Alcide Clever M.D.   On: 08/11/2022 00:30    Assessment/Plan Present on Admission:  Acute hypoxic on chronic hypercapnic respiratory failure (HCC)  Chronic systolic CHF preserved EF 40 to 45%  -CHF order set initiated. -Patient with fluctuating blood pressure.  IV Lasix with hold parameters -Patient on BiPAP, will continue -Patient DNR, no intubation. -Strict I's and O's, daily weights -N.p.o. -Nebulizers as needed   Acute COPD exacerbation -IV Solu-Medrol, scheduled and as needed nebulizers -Empiric IV antibiotics initiated -See above   Hyponatremia -Likely secondary to fluid overload -TSH, urine and plasma osmolality ordered, spot urine ordered -Fluid restrict   Acute metabolic encephalopathy -Likely secondary to above.  Treating underlying symptoms  Hyperkalemia -IV Lasix and nebulizers for now.  Repeat BMP -Patient not acidotic.  pH 7.3, CO2 on lab 29   Bladder cancer with hematuria -Sx April 18th but not able to get to oncolgist -SCDs only for DVT prophylaxis   Essential hypertension -Currently borderline hypotensive.  BP meds on hold   Hyperlipidemia -Cholesterol medications on hold   CKD stage 3a, GFR 45-59 ml/min (HCC) -Kidneys at baseline.  Avoid nephrotoxic medications   DNR (do not resuscitate)  Gerald Valdez 08/11/2022, 1:51 AM

## 2022-08-11 NOTE — Progress Notes (Signed)
Heart Failure Navigator Progress Note  Assessed for Heart & Vascular TOC clinic readiness.  Patient does not meet criteria due to per MD note patient with dementia. EF 45-50%. .   Navigator will sign off at this time.   Rhae Hammock, BSN, Scientist, clinical (histocompatibility and immunogenetics) Only

## 2022-08-11 NOTE — TOC Initial Note (Signed)
Transition of Care [facility]) - Initial/Assessment Note    Patient Details  Name: Gerald Valdez MRN: [id number] Date of Birth: 06/21/1928  Transition of [facility]) CM/SW Contact:    Gala Lewandowsky, RN Phone Number: 08/11/2022, 12:04 PM  Clinical Narrative: Risk for readmission assessment completed. Patient presented for shortness of breath. PTA patient was from home with spouse and daughter that is staying in th home for the summer as well. Spouse and daughter at the bedside during the visit. Patient has DME oxygen baseline 3 liters ([employer]), shower chair, rolling walker, rollator, and bedside commode. Spouse states she takes patient to all appointments. Patient is currently active with [facility] for [facility] RN/PT Services. Agency is aware that the patient is hospitalized. Palliative Care has been consulted. Case Manager will continue to follow for transition of care needs.           Expected Discharge Plan: Home w Home Health Services Barriers to Discharge: Continued Medical Work up  Choice offered to / list presented to : NA  Expected Discharge Plan and Services In-house Referral: NA Discharge Planning Services: CM Consult Post Acute Care Choice: Home Health, Resumption of Svcs/PTA Provider Living arrangements for the past 2 months: Single Family Home    DME Agency: NA   HH Arranged: RN, Disease Management, PT HH Agency: [facility] Date [facility] Agency Contacted: 08/11/22 Time HH Agency Contacted: 1000 Representative spoke with at [facility] Agency: Elnita Maxwell  Prior Living Arrangements/Services Living arrangements for the past 2 months: Single Family Home Lives with:: Spouse, Adult Children Patient language and need for interpreter reviewed:: Yes Do you feel safe going back to the place where you live?: Yes      Need for Family Participation in Patient Care: Yes (Comment) Care giver support system in place?: Yes (comment) Current home services: DME (DME oxygen baseline 3  liters ([employer]), shower chair, rolling walker, rollator, and bedside commode.) Criminal Activity/Legal Involvement Pertinent to Current Situation/Hospitalization: No - Comment as needed  Activities of Daily Living Home Assistive Devices/Equipment: None ADL Screening (condition at time of admission) Patient's cognitive ability adequate to safely complete daily activities?: No (screening provided by wife) Is the patient deaf or have difficulty hearing?: No Does the patient have difficulty seeing, even when wearing glasses/contacts?: No Does the patient have difficulty concentrating, remembering, or making decisions?: Yes Patient able to express need for assistance with ADLs?: Yes Does the patient have difficulty dressing or bathing?: Yes Independently performs ADLs?: No Communication: Independent Dressing (OT): Needs assistance Is this a change from baseline?: Pre-admission baseline Grooming: Needs assistance Is this a change from baseline?: Pre-admission baseline Feeding: Independent Bathing: Needs assistance Is this a change from baseline?: Pre-admission baseline Toileting: Needs assistance Is this a change from baseline?: Pre-admission baseline In/Out Bed: Needs assistance Is this a change from baseline?: Pre-admission baseline Walks in Home: Needs assistance Is this a change from baseline?: Pre-admission baseline Does the patient have difficulty walking or climbing stairs?: Yes Weakness of Legs: Both Weakness of Arms/Hands: Both  Permission Sought/Granted Permission sought to share information with : Family Supports, Case Manager Permission granted to share information with : Yes, Verbal Permission Granted     Permission granted to share info w AGENCY: [facility]   Emotional Assessment Appearance:: Appears stated age Attitude/Demeanor/Rapport: Unable to Assess Affect (typically observed): Unable to Assess   Alcohol / Substance Use: Not Applicable Psych Involvement: No  (comment)  Admission diagnosis:  Encephalopathy acute [G93.40] COPD exacerbation (HCC) [J44.1] Acute respiratory failure  with hypercapnia (HCC) [J96.02] Acute on chronic respiratory failure with hypoxia and hypercapnia (HCC) [J96.21, J96.22] Patient Active Problem List   Diagnosis Date Noted   Hyponatremia 08/11/2022   Hyperkalemia 08/11/2022   COPD with acute exacerbation (HCC) 08/11/2022   Acute respiratory failure with hypercapnia (HCC) 08/11/2022   Acute metabolic encephalopathy 08/02/2022   Seizure like activity (HCC) 08/02/2022   Acute hemorrhagic cystitis 08/02/2022   History of bladder cancer on chronic Foley 08/02/2022   Chronic systolic CHF preserved EF 40 to 45% (congestive heart failure) (HCC) 08/02/2022   DNR (do not resuscitate) 08/02/2022   H/O atrial flutter 08/02/2022   Atrial flutter with rapid ventricular response (HCC) 07/21/2022   Acute heart failure (HCC) 07/21/2022   Acute hypoxic on chronic hypercapnic respiratory failure (HCC) 07/20/2022   Acute on chronic respiratory failure with hypoxia and hypercapnia (HCC) 07/19/2022   Malignant neoplasm of bladder wall (HCC) 05/27/2022   Hypokalemia 07/04/2021   Thrombocytopenia (HCC) 07/04/2021   Iron deficiency anemia 07/04/2021   Protein-calorie malnutrition, severe 07/03/2021   Dehydration 07/01/2021   AKI (acute kidney injury) (HCC) 07/01/2021   CKD stage 3a, GFR 45-59 ml/min (HCC) 07/01/2021   Protein-calorie malnutrition, moderate (HCC) 07/01/2021   Diarrhea 07/01/2021   UTI (urinary tract infection) 07/01/2021   Sepsis (HCC) 07/01/2021   Chronic respiratory failure with hypoxia (HCC) 07/01/2021   Hyperglycemia 07/01/2021   Normocytic anemia 07/01/2021   Urinary retention 06/26/2021   Cancer of overlapping sites of bladder (HCC) 06/25/2021   Malignant neoplasm of overlapping sites of bladder (HCC) 11/20/2020   COPD (chronic obstructive pulmonary disease) (HCC) 02/10/2020   Aortic atherosclerosis (HCC)  02/10/2020   Dyspnea on exertion 02/10/2020   Essential hypertension 02/10/2020   Hyperlipidemia 02/10/2020   GERD (gastroesophageal reflux disease) 02/10/2020   GI bleed 01/30/2014   Acute post-hemorrhagic anemia 01/30/2014   Acute duodenal ulcer with bleeding 01/30/2014   PCP:  [name], MD Pharmacy:   [facility] - [place], [place] - [address] [place] [address] Phone: [phone] Fax: [phone]  Social Determinants of Health (SDOH) Social History: SDOH Screenings   Food Insecurity: No Food Insecurity (08/11/2022)  Housing: Low Risk  (08/11/2022)  Transportation Needs: No Transportation Needs (08/11/2022)  Utilities: Not At Risk (08/11/2022)  Tobacco Use: Medium Risk (08/11/2022)   Readmission Risk Interventions    08/11/2022   12:01 PM 07/27/2022    4:19 PM  Readmission Risk Prevention Plan  Transportation Screening Complete Complete  PCP or Specialist Appt within 3-5 Days Complete Complete  HRI or Home Care Consult Complete   Social Work Consult for Recovery Care Planning/Counseling Complete Complete  Palliative Care Screening Complete Complete  Medication Review Oceanographer) Referral to Pharmacy Complete

## 2022-08-11 NOTE — ED Triage Notes (Addendum)
Pt BIB EMS c/o shortness of breath by family, o2 in the 70's on EMS arrival. Pt on cpap upon arrival to [place].

## 2022-08-11 NOTE — ED Provider Notes (Signed)
[place] EMERGENCY DEPARTMENT AT [facility] Provider Note   CSN: [id number] Arrival date & time: 08/11/22  0003     History  Chief Complaint  Patient presents with   Respiratory Distress    Gerald Valdez is a 89 y.o. male.  HPI Patient presents for respiratory distress.  Medical history includes HTN, HLD, GERD, COPD, arthritis, anxiety, CKD, CAD, GI bleed, atrial flutter, bladder cancer, CHF.  He has had 2 prior hospitalizations this month.  Most recently, he was discharged home 5 days ago.  While in the hospital, he was treated for UTI.  He has not been on antibiotics at home.  He arrives via EMS.  Family reports that he has had increased fatigue today.  Tonight, he developed difficulty breathing.  At baseline, he is conversant but does have dementia.  At baseline, he is on 3 L of supplemental oxygen.  He was found to be hypoxic in the 70s on scene.  SpO2 increased to 99% on 8 L by nonrebreather.  He was placed on CPAP due to work of breathing.  Other vital signs show normal blood pressure, heart rate in the range of 100.  Per EMS, family reports that he is DNR.    Home Medications Prior to Admission medications   Medication Sig Start Date End Date Taking? Authorizing Provider  ascorbic acid (VITAMIN C) 500 MG tablet Take 500 mg by mouth daily.   Yes [provider]  escitalopram (LEXAPRO) 10 MG tablet Take 10 mg by mouth daily.   Yes [provider]  feeding supplement (ENSURE ENLIVE / ENSURE PLUS) LIQD Take 237 mLs by mouth 2 (two) times daily between meals. 07/27/22  Yes Lanae Boast, MD  loratadine (CLARITIN) 10 MG tablet Take 10 mg by mouth daily.   Yes [provider]  metoprolol succinate (TOPROL-XL) 25 MG 24 hr tablet Take 3 tablets (75 mg total) by mouth daily. 08/07/22  Yes Jonah Blue, MD  Multiple Vitamin (MULTIVITAMIN WITH MINERALS) TABS tablet Take 1 tablet by mouth daily with breakfast.   Yes [provider]  pantoprazole  (PROTONIX) 40 MG tablet Take 1 tablet (40 mg total) by mouth 2 (two) times daily before a meal. 08/06/22  Yes Jonah Blue, MD  polyethylene glycol (MIRALAX / GLYCOLAX) 17 g packet Take 17 g by mouth daily as needed for mild constipation. 07/27/22  Yes Lanae Boast, MD  saccharomyces boulardii (FLORASTOR) 250 MG capsule Take 1 capsule (250 mg total) by mouth 2 (two) times daily. 07/05/21  Yes Leatha Gilding, MD  tamsulosin (FLOMAX) 0.4 MG CAPS capsule Take 1 capsule (0.4 mg total) by mouth daily. 08/06/22  Yes Jonah Blue, MD  umeclidinium-vilanterol [facility] ELLIPTA) 62.5-25 MCG/ACT AEPB Inhale 1 puff into the lungs daily. 07/28/22  Yes Lanae Boast, MD  atorvastatin (LIPITOR) 40 MG tablet Take 1 tablet (40 mg total) by mouth daily. 07/28/22   Lanae Boast, MD  OXYGEN Inhale 3 L into the lungs continuous.    [provider]      Allergies    Patient has no known allergies.    Review of Systems   Review of Systems  Unable to perform ROS: Mental status change    Physical Exam Updated Vital Signs BP 106/65   Pulse 83   Temp (!) 97.1 F (36.2 C) (Axillary)   Resp (!) 23   Ht 5\' 6"  (1.676 m)   SpO2 99%   BMI 22.74 kg/m  Physical Exam Vitals and nursing  note reviewed.  Constitutional:      General: He is in acute distress.     Appearance: He is well-developed. He is ill-appearing. He is not toxic-appearing or diaphoretic.  HENT:     Head: Normocephalic and atraumatic.     Right Ear: External ear normal.     Left Ear: External ear normal.     Nose: Nose normal.  Cardiovascular:     Rate and Rhythm: Normal rate and regular rhythm.  Pulmonary:     Effort: Tachypnea and respiratory distress present.     Breath sounds: Decreased air movement present. Decreased breath sounds and wheezing present.  Abdominal:     General: There is no distension.     Palpations: Abdomen is soft.     Tenderness: There is no abdominal tenderness.  Musculoskeletal:        General: No swelling.      Cervical back: Normal range of motion and neck supple.     Right lower leg: No edema.     Left lower leg: No edema.  Skin:    General: Skin is warm and dry.     Coloration: Skin is not jaundiced or pale.  Neurological:     Mental Status: He is disoriented and confused.     ED Results / Procedures / Treatments   Labs (all labs ordered are listed, but only abnormal results are displayed) Labs Reviewed  LACTIC ACID, PLASMA - Abnormal; Notable for the following components:      Result Value   Lactic Acid, Venous 2.2 (*)    All other components within normal limits  COMPREHENSIVE METABOLIC PANEL - Abnormal; Notable for the following components:   Sodium 123 (*)    Potassium 5.2 (*)    Chloride 85 (*)    Glucose, Bld 164 (*)    Creatinine, Ser 1.46 (*)    Calcium 8.5 (*)    Total Protein 6.1 (*)    AST 52 (*)    ALT 77 (*)    GFR, Estimated 45 (*)    All other components within normal limits  CBC WITH DIFFERENTIAL/PLATELET - Abnormal; Notable for the following components:   WBC 10.9 (*)    RBC 3.13 (*)    Hemoglobin 9.2 (*)    HCT 30.3 (*)    nRBC 0.3 (*)    Neutro Abs 8.3 (*)    Monocytes Absolute 1.5 (*)    Abs Immature Granulocytes 0.22 (*)    All other components within normal limits  BRAIN NATRIURETIC PEPTIDE - Abnormal; Notable for the following components:   B Natriuretic Peptide 851.4 (*)    All other components within normal limits  URINALYSIS, W/ REFLEX TO CULTURE (INFECTION SUSPECTED) - Abnormal; Notable for the following components:   Color, Urine AMBER (*)    APPearance CLOUDY (*)    Hgb urine dipstick LARGE (*)    Protein, ur 100 (*)    Leukocytes,Ua TRACE (*)    Bacteria, UA FEW (*)    Non Squamous Epithelial 0-5 (*)    All other components within normal limits  I-STAT CHEM 8, ED - Abnormal; Notable for the following components:   Sodium 120 (*)    Chloride 85 (*)    Creatinine, Ser 1.50 (*)    Glucose, Bld 163 (*)    Calcium, Ion 1.07 (*)     Hemoglobin 11.2 (*)    HCT 33.0 (*)    All other components within normal limits  I-STAT VENOUS BLOOD  GAS, ED - Abnormal; Notable for the following components:   pCO2, Ven 63.1 (*)    Bicarbonate 31.2 (*)    TCO2 33 (*)    Acid-Base Excess 4.0 (*)    Sodium 121 (*)    Potassium 5.2 (*)    Calcium, Ion 1.09 (*)    HCT 32.0 (*)    Hemoglobin 10.9 (*)    All other components within normal limits  CULTURE, BLOOD (ROUTINE X 2)  CULTURE, BLOOD (ROUTINE X 2)  LACTIC ACID, PLASMA  PROTIME-INR  MAGNESIUM  LEGIONELLA PNEUMOPHILA SEROGP 1 UR AG  TROPONIN I (HIGH SENSITIVITY)  TROPONIN I (HIGH SENSITIVITY)    EKG EKG Interpretation Date/Time:  Wednesday August 11 2022 00:21:38 EDT Ventricular Rate:  77 PR Interval:  179 QRS Duration:  87 QT Interval:  379 QTC Calculation: 429 R Axis:   79  Text Interpretation: Sinus rhythm Minimal ST depression, inferior leads Confirmed by Gloris Manchester [phone]) on 08/11/2022 1:08:56 AM  Radiology DG Chest Port 1 View  Result Date: 08/11/2022 CLINICAL DATA:  Possible sepsis EXAM: PORTABLE CHEST 1 VIEW COMPARISON:  08/03/2022 FINDINGS: Cardiac shadow is enlarged. Aortic calcifications are again seen. Lungs are well aerated without focal infiltrate or effusion. No bony abnormality is noted. IMPRESSION: No active disease. Electronically Signed   By: Alcide Clever M.D.   On: 08/11/2022 00:30    Procedures Procedures    Medications Ordered in ED Medications  ipratropium-albuterol (DUONEB) 0.5-2.5 (3) MG/3ML nebulizer solution 3 mL (3 mLs Nebulization Not Given 08/11/22 0023)  azithromycin (ZITHROMAX) 500 mg in sodium chloride 0.9 % 250 mL IVPB (500 mg Intravenous New Bag/Given 08/11/22 0242)  methylPREDNISolone sodium succinate (SOLU-MEDROL) 125 mg/2 mL injection 125 mg (125 mg Intravenous Given 08/11/22 0027)  albuterol (PROVENTIL) (2.5 MG/3ML) 0.083% nebulizer solution 10 mg (10 mg Nebulization Given 08/11/22 0018)  cefTRIAXone (ROCEPHIN) 1 g in sodium chloride 0.9  % 100 mL IVPB (0 g Intravenous Stopped 08/11/22 0238)    ED Course/ Medical Decision Making/ A&P                             Medical Decision Making Amount and/or Complexity of Data Reviewed Labs: ordered. Radiology: ordered. ECG/medicine tests: ordered.  Risk Prescription drug management. Decision regarding hospitalization.   This patient presents to the ED for concern of respiratory distress, this involves an extensive number of treatment options, and is a complaint that carries with it a high risk of complications and morbidity.  The differential diagnosis includes COPD exacerbation, pneumonia, CHF, acidosis, anemia   Co morbidities that complicate the patient evaluation  HTN, HLD, GERD, COPD, arthritis, anxiety, CKD, CAD, GI bleed, atrial flutter, bladder cancer, CHF   Additional history obtained:  Additional history obtained from EMS, patient's family External records from outside source obtained and reviewed including EMR   Lab Tests:  I Ordered, and personally interpreted labs.  The pertinent results include: Creatinine is baseline.  Hyponatremia and hypochloremia are present.  Mild leukocytosis is present with neutrophilia.  Anemia is baseline.  BNP is elevated when compared to lab work from a week ago.  Mild hyperkalemia is present.   Imaging Studies ordered:  I ordered imaging studies including chest x-ray I independently visualized and interpreted imaging which showed no acute findings I agree with the radiologist interpretation   Cardiac Monitoring: / EKG:  The patient was maintained on a cardiac monitor.  I personally viewed and interpreted the cardiac  monitored which showed an underlying rhythm of: Sinus rhythm  Problem List / ED Course / Critical interventions / Medication management  Patient presenting for respiratory distress.  He arrives via EMS on CPAP.  He has confusion and is pulling at the CPAP.  On lung auscultation, he has severely diminished  air movement.  Patient was placed on BiPAP.  Solu-Medrol and nebulized breathing treatments were ordered.  Antibiotics were ordered for treatment of COPD exacerbation as well.  Workup was initiated.  Blood gas shows mild respiratory acidosis.  X-ray did not show acute findings.  Lab work is notable for hyponatremia, hypochloremia, hyperkalemia, leukocytosis, and elevated BNP.  On reassessment, patient is tolerating BiPAP with family at bedside.  Patient's family confirmed no CPR.  They are hesitant to make determination on whether or not to make patient DNI.  Per chart review, he was DNR/DNI during prior hospitalization.  Family states that they "do not want him to suffer".  Patient maintained normal heart rate and normal blood pressure while in the ED.  He had improved air movement on lung auscultation.  He was admitted to medicine for further management. I ordered medication including Solu-Medrol, albuterol, DuoNeb, azithromycin, ceftriaxone for COPD exacerbation Reevaluation of the patient after these medicines showed that the patient improved I have reviewed the patients home medicines and have made adjustments as needed   Social Determinants of Health:  Multiple recent hospitalizations  CRITICAL CARE Performed by: Gloris Manchester   Total critical care time: 35 minutes  Critical care time was exclusive of separately billable procedures and treating other patients.  Critical care was necessary to treat or prevent imminent or life-threatening deterioration.  Critical care was time spent personally by me on the following activities: development of treatment plan with patient and/or surrogate as well as nursing, discussions with consultants, evaluation of patient's response to treatment, examination of patient, obtaining history from patient or surrogate, ordering and performing treatments and interventions, ordering and review of laboratory studies, ordering and review of radiographic studies, pulse  oximetry and re-evaluation of patient's condition.         Final Clinical Impression(s) / ED Diagnoses Final diagnoses:  COPD exacerbation (HCC)  Acute on chronic respiratory failure with hypoxia and hypercapnia (HCC)  Encephalopathy acute    Rx / DC Orders ED Discharge Orders     None         Gloris Manchester, MD 08/11/22 [phone]

## 2022-08-11 NOTE — Progress Notes (Signed)
Received patient on bipap. Family accompanied. Patient received ativan for agitation and pulling at lines, per transfer report. Patient deep asleep but responds to pain. Unable to fully assess neuro at this time. Vitals stable with softer blood pressures. Does not appear to be in any respiratory distress. Oriented wife to unit/room and educated on use of callbell/when to call. Callbell in reach.

## 2022-08-11 NOTE — Progress Notes (Signed)
PROGRESS NOTE    Gerald Valdez  UUV:[id number] DOB: Oct 15, 1928 DOA: 08/11/2022 PCP: [name], MD  89 yo male with h/o bladder cancer with chronic foley catheter, chronic hypoxemic and hypercapnic respiratory failure due to COPD on 3 L home O2, chronic systolic CHF, hypertension, CKD, cognitive deficits, suspected dementia, paroxysmal atrial fibrillation, recently hospitalized with encephalopathy, multifactorial subsequently discharged home 4 days ago. -Back in the ED, admitted this morning with acute worsening shortness of breath and hypoxia   Subjective: -Agitated, given Ativan overnight, somnolent on BiPAP this morning, wife at bedside  Assessment and Plan:  Acute on chronic hypoxic respiratory failure -Multifactorial, he is clinically volume overloaded, also has underlying COPD/chronic respiratory failure -Depressed mentation also contributing -Hold further sedating meds, attempt to wean off BiPAP later today, he is DNR -Significant chronic disease burden with frequent hospitalization, advanced age and frailty, will request palliative care eval  Acute on chronic systolic CHF -Last echo 07/21/2022 with EF 45%, normal RV, small pericardial effusion -Has elevated JVD with 2+ lower extremity edema -Continue IV Lasix today  Hyponatremia -Likely from volume overloaded state, improving, monitor with diuresis  Hyperkalemia -Anticipate improvement with loop diuretics  Dementia, cognitive deficits -At risk of ongoing delirium  Bladder cancer Recent TURBT in April Chronic Foley -UA on admission not suggestive of UTI, routine catheter care  CKD 3 A -Creatinine stable, monitor with diuresis  COPD -Clinically do not suspect COPD exacerbation, discontinue steroids and antibiotics, continue DuoNebs  DVT prophylaxis: Add Lovenox Code Status: DNR Family Communication: Wife at bedside Disposition Plan: To be determined  Consultants:    Procedures:   Antimicrobials:     Objective: Vitals:   08/11/22 0404 08/11/22 0514 08/11/22 0618 08/11/22 0722  BP: (!) 92/52 (!) 93/51 115/64   Pulse: 70 71 66   Resp: (!) 21 (!) 21 18   Temp: 97.7 F (36.5 C)   97.8 F (36.6 C)  TempSrc: Axillary   Axillary  SpO2: 100% 100% 100%   Weight: 62.9 kg     Height: 5\' 6"  (1.676 m)       Intake/Output Summary (Last 24 hours) at 08/11/2022 1045 Last data filed at 08/11/2022 0841 Gross per 24 hour  Intake 350.09 ml  Output 550 ml  Net -199.91 ml   Filed Weights   08/11/22 0404  Weight: 62.9 kg    Examination:  General exam: Chronically ill male laying in bed, BiPAP on, somnolent HEENT: Positive JVD CVS: S1-S2, regular rhythm Lungs: Poor air movement bilaterally, conducted upper airway sounds Abdomen: Soft, nontender, bowel sounds present Extremities: 2+ edema  Data Reviewed:   CBC: Recent Labs  Lab 08/05/22 0545 08/06/22 0530 08/11/22 0020 08/11/22 0031 08/11/22 0617  WBC 7.5 7.3 10.9*  --  8.9  NEUTROABS 4.8 6.0 8.3*  --  7.8*  HGB 8.8* 9.0* 9.2* 10.9*  11.2* 8.5*  HCT 28.3* 29.4* 30.3* 32.0*  33.0* 27.7*  MCV 97.6 95.8 96.8  --  94.2  PLT 174 172 260  --  217   Basic Metabolic Panel: Recent Labs  Lab 08/05/22 0545 08/06/22 0530 08/11/22 0020 08/11/22 0031 08/11/22 0617  NA 134* 133* 123* 121*  120* 125*  K 4.2 4.4 5.2* 5.2*  5.1 5.3*  CL 95* 93* 85* 85* 86*  CO2 32 33* 29  --  30  GLUCOSE 94 93 164* 163* 151*  BUN 18 18 17 21 19   CREATININE 1.25* 1.33* 1.46* 1.50* 1.54*  CALCIUM 8.1* 8.3* 8.5*  --  8.5*  MG 2.1 1.9 1.9  --  1.8   GFR: Estimated Creatinine Clearance: 27.8 mL/min (A) (by C-G formula based on SCr of 1.54 mg/dL (H)). Liver Function Tests: Recent Labs  Lab 08/11/22 0020  AST 52*  ALT 77*  ALKPHOS 38  BILITOT 0.7  PROT 6.1*  ALBUMIN 3.5   No results for input(s): "LIPASE", "AMYLASE" in the last 168 hours. No results for input(s): "AMMONIA" in the last 168 hours. Coagulation Profile: Recent Labs  Lab  08/11/22 0020  INR 1.1   Cardiac Enzymes: No results for input(s): "CKTOTAL", "CKMB", "CKMBINDEX", "TROPONINI" in the last 168 hours. BNP (last 3 results) No results for input(s): "PROBNP" in the last 8760 hours. HbA1C: No results for input(s): "HGBA1C" in the last 72 hours. CBG: Recent Labs  Lab 08/05/22 1650  GLUCAP 164*   Lipid Profile: No results for input(s): "CHOL", "HDL", "LDLCALC", "TRIG", "CHOLHDL", "LDLDIRECT" in the last 72 hours. Thyroid Function Tests: Recent Labs    08/11/22 0617  TSH 2.904   Anemia Panel: No results for input(s): "VITAMINB12", "FOLATE", "FERRITIN", "TIBC", "IRON", "RETICCTPCT" in the last 72 hours. Urine analysis:    Component Value Date/Time   COLORURINE AMBER (A) 08/11/2022 0039   APPEARANCEUR CLOUDY (A) 08/11/2022 0039   LABSPEC 1.017 08/11/2022 0039   PHURINE 6.0 08/11/2022 0039   GLUCOSEU NEGATIVE 08/11/2022 0039   HGBUR LARGE (A) 08/11/2022 0039   BILIRUBINUR NEGATIVE 08/11/2022 0039   KETONESUR NEGATIVE 08/11/2022 0039   PROTEINUR 100 (A) 08/11/2022 0039   NITRITE NEGATIVE 08/11/2022 0039   LEUKOCYTESUR TRACE (A) 08/11/2022 0039   Sepsis Labs: @LABRCNTIP (procalcitonin:4,lacticidven:4)  ) Recent Results (from the past 240 hour(s))  Urine Culture     Status: None   Collection Time: 08/01/22  8:10 PM   Specimen: Urine, Clean Catch  Result Value Ref Range Status   Specimen Description URINE, CLEAN CATCH  Final   Special Requests NONE  Final   Culture   Final    NO GROWTH Performed at [facility], [address] [place], [place] [address]    Report Status 08/03/2022 FINAL  Final     Radiology Studies: DG Chest Port 1 View  Result Date: 08/11/2022 CLINICAL DATA:  Possible sepsis EXAM: PORTABLE CHEST 1 VIEW COMPARISON:  08/03/2022 FINDINGS: Cardiac shadow is enlarged. Aortic calcifications are again seen. Lungs are well aerated without focal infiltrate or effusion. No bony abnormality is noted. IMPRESSION: No active  disease. Electronically Signed   By: Alcide Clever M.D.   On: 08/11/2022 00:30     Scheduled Meds:  Chlorhexidine Gluconate Cloth  6 each Topical Q0600   furosemide  40 mg Intravenous Q12H   ipratropium-albuterol  3 mL Nebulization Once   Continuous Infusions:  [START ON 08/12/2022] azithromycin     [START ON 08/12/2022] cefTRIAXone (ROCEPHIN)  IV       LOS: 0 days    Time spent:    Zannie Cove, MD [facility]   08/11/2022, 10:45 AM

## 2022-08-11 NOTE — Consult Note (Signed)
Consultation Note Date: 08/11/2022 at 1330  Patient Name: Gerald Valdez  DOB: Sep 01, 1928  MRN: [id number]  Age / Sex: 89 y.o., male  PCP: [name], MD Referring Physician: Zannie Cove, MD  Reason for Consultation: Establishing goals of care  HPI/Patient Profile: 89 y.o. male  with past medical history of bladder cancer with chronic Foley catheter, chronic hypoxemic and hypercapnic respiratory failure due to COPD (3 L nasal cannula at baseline), chronic systolic CHF, HTN, CKD, cognitive deficits with suspected dementia, proximal A-fib, and recent hospitalization for encephalopathy (multifactorial) with discharge home only 4 days ago admitted on 08/11/2022 with acute worsening shortness of breath and hypoxia.  PMT was consulted to support patient and discuss goals of care.  Of note, patient is familiar to our service as we saw him during his July 24, 2022 hospitalization.  Clinical Assessment and Goals of Care: I have reviewed medical records including EPIC notes, labs and imaging, assessed the patient and then met with patient and his wife at bedside to discuss diagnosis prognosis, GOC, EOL wishes, disposition and options.  Patient is sleeping in no apparent distress.  He awakens easily but quickly returns to sleep.  No adjustment to [facility] needed at this time.   He is unable to participate in medical discussions or goals of care decision making at this time.  Entirety of discussion was held with his wife at bedside.  I introduced Palliative Medicine as specialized medical care for people living with serious illness. It focuses on providing relief from the symptoms and stress of a serious illness. The goal is to improve quality of life for both the patient and the family.  We discussed a brief life review of the patient.  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  He is  married and has 2 children, a son and a daughter.  As far as functional and nutritional status wife endorses that patient has intermittent memory issues.  She shares he can become confused and then quickly returned to baseline and be oriented x 4.  She shares that his recent hospitalizations have increased his confusion and he has not been able to "bounce back as quickly".  She reports he was never a big eater and most recently has had poor p.o. intake.  She and her daughter have "begged and pleaded with him to eat".  We discussed patient's current illness and what it means in the larger context of patient's on-going co-morbidities.  Dementia discussed as a irreversible, chronic, and progressive disease that is often exacerbated by acute illnesses and hospitalization.  Natural disease trajectory of hypoxic respiratory failure superimposed on dementia discussed.  I attempted to elicit values and goals of care important to the patient.  Wife shares that she has been through this with her husband before and is hopeful for her recovery again.  She knows that he does not like to wear the breathing machine (BiPAP) but can tell a huge difference in his cognition when he does keep it on.  She hopes that he will continue to wear it and be able to return home on his nasal cannula again.  Advance directives, concepts specific to code status, artificial feeding and hydration, and rehospitalization were considered and discussed.  Wife endorses that DNR with limited interventions remains.  She shares he would never want to be artificially sustained by machines.  Education offered regarding concept specific to human mortality and the limitations of medical interventions to prolong life when the body begins to fail to thrive.  Therapeutic silence, active listening, and emotional support provided.  We discussed treating the treatable.  We reviewed holding hope alongside reality of what patient's earthly body can  survive.  I reviewed that PMT will continue to follow and support her and patient throughout his hospitalization.  Outlined that we will also remain hopeful for patient's recovery but that difficult decisions may come if patient does not show improvement.  Wife mentions hospice.  Hospice philosophy, aging in place, and holistic approach to relieving pain and suffering reviewed.  Patient's wife shares she understands what hospice is and is hopeful that "we do not have to go down that road right now".  Time for outcomes discussed.  Wife shares she would like to see if/how patient improves over the next 24 to 48 hours.  She remains hopeful that he can "pull through".   Discussed with patient/family the importance of continued conversation with family and the medical providers regarding overall plan of care and treatment options, ensuring decisions are within the context of the patient's values and GOCs.    Questions and concerns were addressed. The family was encouraged to call with questions or concerns.   PMT will continue to follow/support.   Primary Decision Maker NEXT OF KIN  Physical Exam Vitals reviewed.  Constitutional:      General: He is not in acute distress.    Appearance: He is normal weight.     Comments: Frail, thin  HENT:     Head: Normocephalic.     Mouth/Throat:     Mouth: Mucous membranes are moist.  Cardiovascular:     Rate and Rhythm: Normal rate.  Abdominal:     Palpations: Abdomen is soft.  Musculoskeletal:     Comments: Generalized weakness  Skin:    General: Skin is warm and dry.     Coloration: Skin is pale.     Comments: Abrasion to left cheek - wife endorses this is chronic failure to heal after biopsy taken     Palliative Assessment/Data: 40%     Thank you for this consult. Palliative medicine will continue to follow and assist holistically.   Time Total: 75 minutes Greater than 50%  of this time was spent counseling and coordinating care  related to the above assessment and plan.  Signed by: Georgiann Cocker, DNP, FNP-BC Palliative Medicine    Please contact Palliative Medicine Team phone at [phone] for questions and concerns.  For individual provider: See Loretha Stapler

## 2022-08-12 DIAGNOSIS — J9612 Chronic respiratory failure with hypercapnia: Secondary | ICD-10-CM | POA: Diagnosis not present

## 2022-08-12 DIAGNOSIS — J9601 Acute respiratory failure with hypoxia: Secondary | ICD-10-CM | POA: Diagnosis not present

## 2022-08-12 LAB — BASIC METABOLIC PANEL
Anion gap: 10 (ref 5–15)
BUN: 22 mg/dL (ref 8–23)
CO2: 31 mmol/L (ref 22–32)
Calcium: 8.7 mg/dL — ABNORMAL LOW (ref 8.9–10.3)
Chloride: 85 mmol/L — ABNORMAL LOW (ref 98–111)
Creatinine, Ser: 1.53 mg/dL — ABNORMAL HIGH (ref 0.61–1.24)
GFR, Estimated: 43 mL/min — ABNORMAL LOW (ref >60)
Glucose, Bld: 112 mg/dL — ABNORMAL HIGH (ref 70–99)
Potassium: 5.1 mmol/L (ref 3.5–5.1)
Sodium: 126 mmol/L — ABNORMAL LOW (ref 135–145)

## 2022-08-12 LAB — CBC
HCT: 27.1 % — ABNORMAL LOW (ref 39.0–52.0)
Hemoglobin: 8.5 g/dL — ABNORMAL LOW (ref 13.0–17.0)
MCH: 29.8 pg (ref 26.0–34.0)
MCHC: 31.4 g/dL (ref 30.0–36.0)
MCV: 95.1 fL (ref 80.0–100.0)
Platelets: 252 10*3/uL (ref 150–400)
RBC: 2.85 MIL/uL — ABNORMAL LOW (ref 4.22–5.81)
RDW: 13.2 % (ref 11.5–15.5)
WBC: 13 10*3/uL — ABNORMAL HIGH (ref 4.0–10.5)
nRBC: 0.2 % (ref 0.0–0.2)

## 2022-08-12 MED ORDER — ATORVASTATIN CALCIUM 10 MG PO TABS
20.0000 mg | ORAL_TABLET | Freq: Every day | ORAL | Status: DC
  Administered 2022-08-12 – 2022-08-15 (×4): 20 mg via ORAL
  Filled 2022-08-12 (×5): qty 2

## 2022-08-12 MED ORDER — IRON SUCROSE 500 MG IVPB - SIMPLE MED
500.0000 mg | Freq: Once | INTRAVENOUS | Status: DC
  Filled 2022-08-12: qty 275

## 2022-08-12 MED ORDER — ESCITALOPRAM OXALATE 10 MG PO TABS
10.0000 mg | ORAL_TABLET | Freq: Every day | ORAL | Status: DC
  Administered 2022-08-12 – 2022-08-15 (×4): 10 mg via ORAL
  Filled 2022-08-12 (×4): qty 1

## 2022-08-12 MED ORDER — IPRATROPIUM-ALBUTEROL 0.5-2.5 (3) MG/3ML IN SOLN: 3.0000 mL | RESPIRATORY_TRACT | Status: DC | PRN

## 2022-08-12 MED ORDER — METOPROLOL SUCCINATE ER 25 MG PO TB24
25.0000 mg | ORAL_TABLET | Freq: Every day | ORAL | Status: DC
  Administered 2022-08-12 – 2022-08-13 (×2): 25 mg via ORAL
  Filled 2022-08-12 (×2): qty 1

## 2022-08-12 MED ORDER — PANTOPRAZOLE SODIUM 40 MG PO TBEC
40.0000 mg | DELAYED_RELEASE_TABLET | Freq: Every day | ORAL | Status: DC
  Administered 2022-08-12 – 2022-08-15 (×4): 40 mg via ORAL
  Filled 2022-08-12 (×4): qty 1

## 2022-08-12 MED ORDER — SODIUM CHLORIDE 0.9 % IV SOLN: 510.0000 mg | Freq: Once | INTRAVENOUS | Status: DC

## 2022-08-12 MED ORDER — SODIUM CHLORIDE 0.9 % IV SOLN
500.0000 mg | Freq: Once | INTRAVENOUS | Status: AC
  Administered 2022-08-12: 500 mg via INTRAVENOUS
  Filled 2022-08-12: qty 25

## 2022-08-12 NOTE — Evaluation (Signed)
Physical Therapy Evaluation Patient Details Name: Gerald Valdez MRN: [id number] DOB: November 26, 1928 Today's Date: 08/12/2022  History of Present Illness  89 y/o M admitted to [facility] 7/3 for acute on chronic hypoxic hypercapneic respiratory failure and acute COPD exacerbation. PMHx: bladder cancer on chronic Foley, chronic hypoxic and hypercarbic respiratory failure on 3L O2, COPD, HFpEF, HTN, HLD, chronic anemia, CKD, a-fib on Eliquis  Clinical Impression   Pt received in bed with family present, pleasant and cooperative during session. We were limited to side steps at EOB due to HR elevation to 120s with activity, however SPO2 remained WNL on 3LPM during activities today. Very easily fatigued, also very poor balance in standing with strong posterior lean and limited ability to recover independently. Family plans to take him home when medically ready to DC, in this case will need to maximize skilled PT services in house (as medically appropriate) as well as post-acute care follow-up.         Assistance Recommended at Discharge Frequent or constant Supervision/Assistance  If plan is discharge home, recommend the following:  Can travel by private vehicle  A little help with walking and/or transfers;A little help with bathing/dressing/bathroom;Assistance with cooking/housework;Assist for transportation;Help with stairs or ramp for entrance   No    Equipment Recommendations Hospital bed;BSC/3in1  Recommendations for Other Services       Functional Status Assessment Patient has had a recent decline in their functional status and/or demonstrates limited ability to make significant improvements in function in a reasonable and predictable amount of time     Precautions / Restrictions Precautions Precautions: Fall;Other (comment) Precaution Comments: monitor HR and O2 Restrictions Weight Bearing Restrictions: No      Mobility  Bed Mobility Overal bed mobility: Needs Assistance Bed Mobility:  Supine to Sit, Sit to Supine     Supine to sit: Supervision, HOB elevated Sit to supine: HOB elevated, Min assist, +2 for physical assistance   General bed mobility comments: able to boost to EOB without difficulty with HOB elevated, needed MinA +2 (assist from family) for safe return to supine due to fatigue/lines    Transfers Overall transfer level: Needs assistance Equipment used: Rolling walker (2 wheels) Transfers: Sit to/from Stand Sit to Stand: Max assist           General transfer comment: MaxA to power up on 2 attempts, strong posterior lean and MaxA to bring wt forward/prevent posterior LOB; HR to 126bpm    Ambulation/Gait               General Gait Details: deferred gait due to elevated HR, able to take 4-5 side steps at EOB with HR elevation to 126BPM, good recovery to resting in sitting  Stairs            Wheelchair Mobility     Tilt Bed    Modified Rankin (Stroke Patients Only)       Balance Overall balance assessment: Needs assistance Sitting-balance support: Feet supported, Bilateral upper extremity supported Sitting balance-Leahy Scale: Good     Standing balance support: Bilateral upper extremity supported, During functional activity, Reliant on assistive device for balance Standing balance-Leahy Scale: Poor Standing balance comment: reliant on RW to maintain balance                             Pertinent Vitals/Pain Pain Assessment Pain Assessment: Faces Faces Pain Scale: No hurt    Home Living Family/patient expects to be discharged  to:: Private residence Living Arrangements: Spouse/significant other Available Help at Discharge: Family;Available 24 hours/day Type of Home: House Home Access: Ramped entrance       Home Layout: One level Home Equipment: Rollator (4 wheels);Rolling Walker (2 wheels);Tub bench;BSC/3in1;Hand held shower head Additional Comments: Pt wears 3L at baseline. Has concentrator at home     Prior Function Prior Level of Function : Needs assist             Mobility Comments: at SNF prior to admission, working on [employer] with RW, at baseline pt utilizes rollator or no AD ADLs Comments: at SNF prior to admission. Pt previously requiring Supervision for toileting and Min assist for LB dressing and LB bathing.     Hand Dominance   Dominant Hand: Right    Extremity/Trunk Assessment   Upper Extremity Assessment Upper Extremity Assessment: Defer to OT evaluation    Lower Extremity Assessment Lower Extremity Assessment: Generalized weakness    Cervical / Trunk Assessment Cervical / Trunk Assessment: Kyphotic  Communication   Communication: HOH  Cognition Arousal/Alertness: Awake/alert Behavior During Therapy: WFL for tasks assessed/performed, Flat affect Overall Cognitive Status: Within Functional Limits for tasks assessed                                 General Comments: Pt HOH so requiring increased time for understanding commands but able to follow, does better with visual cues        General Comments General comments (skin integrity, edema, etc.): HR to 120s with mobility, SPO2 WNL on 3LPM (baseline)    Exercises     Assessment/Plan    PT Assessment Patient needs continued PT services  PT Problem List Decreased strength;Decreased activity tolerance;Decreased balance;Decreased mobility;Decreased safety awareness;Decreased knowledge of precautions;Cardiopulmonary status limiting activity       PT Treatment Interventions DME instruction;Gait training;Functional mobility training;Therapeutic activities;Therapeutic exercise;Balance training;Neuromuscular re-education;Patient/family education    PT Goals (Current goals can be found in the Care Plan section)  Acute Rehab PT Goals Patient Stated Goal: go home PT Goal Formulation: With patient/family Time For Goal Achievement: 08/26/22 Potential to Achieve Goals: Fair    Frequency Min  1X/week     Co-evaluation               AM-PAC PT "6 Clicks" Mobility  Outcome Measure Help needed turning from your back to your side while in a flat bed without using bedrails?: A Little Help needed moving from lying on your back to sitting on the side of a flat bed without using bedrails?: A Little Help needed moving to and from a bed to a chair (including a wheelchair)?: A Lot Help needed standing up from a chair using your arms (e.g., wheelchair or bedside chair)?: A Lot Help needed to walk in hospital room?: Total Help needed climbing 3-5 steps with a railing? : Total 6 Click Score: 12    End of Session Equipment Utilized During Treatment: Gait belt;Oxygen Activity Tolerance: Patient limited by fatigue Patient left: in bed;with call bell/phone within reach;with bed alarm set;with family/visitor present Nurse Communication: Mobility status PT Visit Diagnosis: Other abnormalities of gait and mobility (R26.89);Muscle weakness (generalized) (M62.81);Difficulty in walking, not elsewhere classified (R26.2)    Time: 0347-4259 PT Time Calculation (min) (ACUTE ONLY): 20 min   Charges:   PT Evaluation $PT Eval Moderate Complexity: 1 Mod   PT General Charges $$ ACUTE PT VISIT: 1 Visit  Nedra Hai, PT, DPT 08/12/22 4:22 PM

## 2022-08-12 NOTE — Progress Notes (Addendum)
PROGRESS NOTE    Gerald Valdez  ZOX:[id number] DOB: Mar 04, 1928 DOA: 08/11/2022 PCP: [name], MD  89 yo male with h/o bladder cancer with chronic foley catheter, chronic hypoxemic and hypercapnic respiratory failure due to COPD on 3 L home O2, chronic systolic CHF, hypertension, CKD, cognitive deficits, suspected dementia, paroxysmal atrial fibrillation, recently hospitalized with encephalopathy, multifactorial subsequently discharged home 4 days ago. -Back in the ED, admitted this morning with acute worsening shortness of breath and hypoxia   Subjective: -Agitated, given Ativan overnight, somnolent on BiPAP this morning, wife at bedside  Assessment and Plan:  Acute on chronic hypoxic respiratory failure -Multifactorial, he is clinically volume overloaded, also has underlying COPD/chronic respiratory failure -Weaned off BiPAP -More stable now, mental status improving, continue IV Lasix 1 more day -Significant chronic disease burden with frequent hospitalization, advanced age and frailty, palliative care involved  Acute on chronic systolic CHF -Last echo 07/21/2022 with EF 45%, normal RV, small pericardial effusion -Has elevated JVD with edema -Improving on diuretics, continue IV Lasix 1 more day  Paroxysmal A-fib -In sinus rhythm at this time, was taken off anticoagulation recently on account of hematuria, bladder cancer, discussed risk benefits, family would like to hold off on resuming anticoagulation -Restart Toprol at lower dose  Hyponatremia -Likely from volume overloaded state, improving,   Hyperkalemia -Resolved  Dementia, cognitive deficits -At risk of ongoing delirium  Bladder cancer Recent TURBT in April Chronic Foley -UA on admission not suggestive of UTI, routine catheter care  CKD 3 A -Creatinine stable, monitor with diuresis  COPD -Clinically do not suspect COPD exacerbation, discontinued steroids and antibiotics, continue DuoNebs  DVT  prophylaxis: SCDs, Code Status: DNR Family Communication: Family at bedside Disposition Plan: Home likely 1 to 2 days  Consultants:    Procedures:   Antimicrobials:    Objective: Vitals:   08/12/22 0423 08/12/22 0742 08/12/22 0800 08/12/22 0807  BP: 113/67  117/71 117/71  Pulse: 76  94 97  Resp: 19  19 (!) 23  Temp: 98.8 F (37.1 C)  98.5 F (36.9 C)   TempSrc: Oral  Oral   SpO2:  97% 98% 93%  Weight: 61.9 kg     Height:        Intake/Output Summary (Last 24 hours) at 08/12/2022 1120 Last data filed at 08/12/2022 0721 Gross per 24 hour  Intake --  Output 3700 ml  Net -3700 ml   Filed Weights   08/11/22 0404 08/12/22 0423  Weight: 62.9 kg 61.9 kg    Examination:  General exam: Chronically ill male laying in bed, awake today, oriented to self and place, cognitive deficits noted HEENT: Positive JVD CVS: S1-S2, regular rhythm Lungs: Decreased breath sounds to bases otherwise clear Abdomen: Soft, nontender, bowel sounds present Extremities: 1+ edema  Data Reviewed:   CBC: Recent Labs  Lab 08/06/22 0530 08/11/22 0020 08/11/22 0031 08/11/22 0617 08/12/22 0102  WBC 7.3 10.9*  --  8.9 13.0*  NEUTROABS 6.0 8.3*  --  7.8*  --   HGB 9.0* 9.2* 10.9*  11.2* 8.5* 8.5*  HCT 29.4* 30.3* 32.0*  33.0* 27.7* 27.1*  MCV 95.8 96.8  --  94.2 95.1  PLT 172 260  --  217 252   Basic Metabolic Panel: Recent Labs  Lab 08/06/22 0530 08/11/22 0020 08/11/22 0031 08/11/22 0617 08/12/22 0102  NA 133* 123* 121*  120* 125* 126*  K 4.4 5.2* 5.2*  5.1 5.3* 5.1  CL 93* 85* 85* 86* 85*  CO2 33* 29  --  30 31  GLUCOSE 93 164* 163* 151* 112*  BUN 18 17 21 19 22   CREATININE 1.33* 1.46* 1.50* 1.54* 1.53*  CALCIUM 8.3* 8.5*  --  8.5* 8.7*  MG 1.9 1.9  --  1.8  --    GFR: Estimated Creatinine Clearance: 27.5 mL/min (A) (by C-G formula based on SCr of 1.53 mg/dL (H)). Liver Function Tests: Recent Labs  Lab 08/11/22 0020  AST 52*  ALT 77*  ALKPHOS 38  BILITOT 0.7  PROT  6.1*  ALBUMIN 3.5   No results for input(s): "LIPASE", "AMYLASE" in the last 168 hours. No results for input(s): "AMMONIA" in the last 168 hours. Coagulation Profile: Recent Labs  Lab 08/11/22 0020  INR 1.1   Cardiac Enzymes: No results for input(s): "CKTOTAL", "CKMB", "CKMBINDEX", "TROPONINI" in the last 168 hours. BNP (last 3 results) No results for input(s): "PROBNP" in the last 8760 hours. HbA1C: No results for input(s): "HGBA1C" in the last 72 hours. CBG: Recent Labs  Lab 08/05/22 1650  GLUCAP 164*   Lipid Profile: No results for input(s): "CHOL", "HDL", "LDLCALC", "TRIG", "CHOLHDL", "LDLDIRECT" in the last 72 hours. Thyroid Function Tests: Recent Labs    08/11/22 0617  TSH 2.904   Anemia Panel: No results for input(s): "VITAMINB12", "FOLATE", "FERRITIN", "TIBC", "IRON", "RETICCTPCT" in the last 72 hours. Urine analysis:    Component Value Date/Time   COLORURINE AMBER (A) 08/11/2022 0039   APPEARANCEUR CLOUDY (A) 08/11/2022 0039   LABSPEC 1.017 08/11/2022 0039   PHURINE 6.0 08/11/2022 0039   GLUCOSEU NEGATIVE 08/11/2022 0039   HGBUR LARGE (A) 08/11/2022 0039   BILIRUBINUR NEGATIVE 08/11/2022 0039   KETONESUR NEGATIVE 08/11/2022 0039   PROTEINUR 100 (A) 08/11/2022 0039   NITRITE NEGATIVE 08/11/2022 0039   LEUKOCYTESUR TRACE (A) 08/11/2022 0039   Sepsis Labs: @LABRCNTIP (procalcitonin:4,lacticidven:4)  ) No results found for this or any previous visit (from the past 240 hour(s)).    Radiology Studies: DG Chest Port 1 View  Result Date: 08/11/2022 CLINICAL DATA:  Possible sepsis EXAM: PORTABLE CHEST 1 VIEW COMPARISON:  08/03/2022 FINDINGS: Cardiac shadow is enlarged. Aortic calcifications are again seen. Lungs are well aerated without focal infiltrate or effusion. No bony abnormality is noted. IMPRESSION: No active disease. Electronically Signed   By: Alcide Clever M.D.   On: 08/11/2022 00:30     Scheduled Meds:  Chlorhexidine Gluconate Cloth  6 each  Topical Q0600   furosemide  40 mg Intravenous Q12H   Continuous Infusions:  iron sucrose 500 mg (08/12/22 1101)     LOS: 1 day    Time spent:    Zannie Cove, MD [facility]   08/12/2022, 11:20 AM

## 2022-08-12 NOTE — Progress Notes (Signed)
Palliative:  Chart reviewed. Palliative continues to follow. Per conversation with wife yesterday, she requested time for outcomes, hopeful for improvement.  Upon chart review today patient has been weaned off bipap and improvement in mental status.   Will continue to follow.  Gerlean Ren, DNP, AGNP-C Palliative Medicine Team Team Phone # [phone]  Pager # [phone]  NO CHARGE

## 2022-08-13 ENCOUNTER — Ambulatory Visit: Payer: Medicare Other | Admitting: Nurse Practitioner

## 2022-08-13 DIAGNOSIS — J9612 Chronic respiratory failure with hypercapnia: Secondary | ICD-10-CM | POA: Diagnosis not present

## 2022-08-13 DIAGNOSIS — J9601 Acute respiratory failure with hypoxia: Secondary | ICD-10-CM | POA: Diagnosis not present

## 2022-08-13 LAB — BASIC METABOLIC PANEL
Anion gap: 11 (ref 5–15)
BUN: 16 mg/dL (ref 8–23)
CO2: 42 mmol/L — ABNORMAL HIGH (ref 22–32)
Calcium: 8.6 mg/dL — ABNORMAL LOW (ref 8.9–10.3)
Chloride: 78 mmol/L — ABNORMAL LOW (ref 98–111)
Creatinine, Ser: 1.36 mg/dL — ABNORMAL HIGH (ref 0.61–1.24)
GFR, Estimated: 49 mL/min — ABNORMAL LOW (ref >60)
Glucose, Bld: 90 mg/dL (ref 70–99)
Potassium: 3.4 mmol/L — ABNORMAL LOW (ref 3.5–5.1)
Sodium: 131 mmol/L — ABNORMAL LOW (ref 135–145)

## 2022-08-13 LAB — LEGIONELLA PNEUMOPHILA SEROGP 1 UR AG: L. pneumophila Serogp 1 Ur Ag: NEGATIVE

## 2022-08-13 LAB — CBC
HCT: 28.9 % — ABNORMAL LOW (ref 39.0–52.0)
Hemoglobin: 9 g/dL — ABNORMAL LOW (ref 13.0–17.0)
MCH: 29.6 pg (ref 26.0–34.0)
MCHC: 31.1 g/dL (ref 30.0–36.0)
MCV: 95.1 fL (ref 80.0–100.0)
Platelets: 269 10*3/uL (ref 150–400)
RBC: 3.04 MIL/uL — ABNORMAL LOW (ref 4.22–5.81)
RDW: 13.2 % (ref 11.5–15.5)
WBC: 10 10*3/uL (ref 4.0–10.5)
nRBC: 0 % (ref 0.0–0.2)

## 2022-08-13 LAB — CULTURE, BLOOD (ROUTINE X 2)

## 2022-08-13 MED ORDER — POTASSIUM CHLORIDE CRYS ER 20 MEQ PO TBCR
40.0000 meq | EXTENDED_RELEASE_TABLET | Freq: Two times a day (BID) | ORAL | Status: AC
  Administered 2022-08-13 (×2): 40 meq via ORAL
  Filled 2022-08-13 (×2): qty 2

## 2022-08-13 NOTE — Evaluation (Signed)
Occupational Therapy Evaluation Patient Details Name: Gerald Valdez MRN: [id number] DOB: 1928/10/10 Today's Date: 08/13/2022   History of Present Illness 89 y/o M admitted to [facility] 7/3 for acute on chronic hypoxic hypercapneic respiratory failure and acute COPD exacerbation. PMHx: bladder cancer on chronic Foley, chronic hypoxic and hypercarbic respiratory failure on 3L O2, COPD, HFpEF, HTN, HLD, chronic anemia, CKD, a-fib on Eliquis   Clinical Impression   Pt currently at mod to max assist for simulated selfcare sit to stand.  Limited session secondary to elevated HR and hypotension sitting EOB.   In supine BP at 113/61 prior to transition to sitting. Once in sitting BP at 62/47,71/43, 76/51 with HR from 130 BPm up to 141 BPM. BP was taken on each arm for verification.  He did not report significant dizziness or lightheadedness when asked.  Transitioned back to supine with BP increasing up to 108/53 with HOB at less than 25 degrees. Pt currently lives with his spouse and daughter and son live close and can assist as needed.  Recommend acute care OT at this time in order to help increase independence and reduce burden of care with ADLs for return home with home health if pt continues to progress as expected.       Recommendations for follow up therapy are one component of a multi-disciplinary discharge planning process, led by the attending physician.  Recommendations may be updated based on patient status, additional functional criteria and insurance authorization.   Assistance Recommended at Discharge Frequent or constant Supervision/Assistance  Patient can return home with the following A lot of help with walking and/or transfers;A lot of help with bathing/dressing/bathroom;Assistance with cooking/housework;Assist for transportation;Help with stairs or ramp for entrance    Functional Status Assessment  Patient has had a recent decline in their functional status and demonstrates the ability to  make significant improvements in function in a reasonable and predictable amount of time.  Equipment Recommendations  None recommended by OT       Precautions / Restrictions Precautions Precautions: Fall;Other (comment) Precaution Comments: monitor HR and O2 Restrictions Weight Bearing Restrictions: No      Mobility Bed Mobility Overal bed mobility: Needs Assistance Bed Mobility: Supine to Sit, Sit to Supine     Supine to sit: Supervision, HOB elevated Sit to supine: Supervision        Transfers Overall transfer level: Needs assistance Equipment used: None Transfers: Sit to/from Stand Sit to Stand: Max assist     Step pivot transfers: Max assist     General transfer comment: Max assist for stepping up toward the top of the bed.      Balance Overall balance assessment: Needs assistance Sitting-balance support: Feet supported, Bilateral upper extremity supported Sitting balance-Leahy Scale: Good     Standing balance support: Bilateral upper extremity supported, During functional activity, Reliant on assistive device for balance Standing balance-Leahy Scale: Zero Standing balance comment: Pt needs max assist to maintain standing balance.                           ADL either performed or assessed with clinical judgement   ADL Overall ADL's : Needs assistance/impaired Eating/Feeding: Set up;Sitting   Grooming: Set up;Sitting Grooming Details (indicate cue type and reason): simulated EOB Upper Body Bathing: Supervision/ safety;Sitting Upper Body Bathing Details (indicate cue type and reason): simulated EOB Lower Body Bathing: Maximal assistance;Sit to/from stand Lower Body Bathing Details (indicate cue type and reason): simulated Upper Body  Dressing : Minimal assistance;Sitting Upper Body Dressing Details (indicate cue type and reason): simulated sitting EOB unsupported Lower Body Dressing: Maximal assistance;Sit to/from stand Lower Body Dressing  Details (indicate cue type and reason): Pt min guard for donning gripper socks but needs max assist for standing if pulling garments over hips. Toilet Transfer: Maximal assistance (simulated stepping up toward the top of the bed)   Toileting- Clothing Manipulation and Hygiene: Maximal assistance;Sit to/from stand Toileting - Clothing Manipulation Details (indicate cue type and reason): simulated     Functional mobility during ADLs: Maximal assistance (stand step up the EOB) General ADL Comments: Pt with orthostatic hypotension with transfer to the EOB and with elevated HR.  In supine BP at 113/61 priot to transition to sitting.  Once in sitting BP at 62/47,71/43, 76/51 with HR from 130 BPm up to 141 BPM.  Pt not reporting dizziness.  Transitioned back to supine with BP up to 108/53 with HOB at less than 25 degrees.     Vision Baseline Vision/History: 1 Wears glasses Ability to See in Adequate Light: 0 Adequate Patient Visual Report: No change from baseline Vision Assessment?: No apparent visual deficits     Perception  Not tested   Praxis  Not tested    Pertinent Vitals/Pain Pain Assessment Pain Assessment: Faces Pain Score: 0-No pain     Hand Dominance Right   Extremity/Trunk Assessment Upper Extremity Assessment RUE Deficits / Details: Decreased shoulder AROM secondary to previous rotator cuff injury. Pt at shoulder AROM at baseline with flexion bilaterally 0-90 degrees.  All other joints AROM WFLs with strength at 4/5 RUE Sensation: WNL RUE Coordination: WNL       Cervical / Trunk Assessment Cervical / Trunk Assessment: Kyphotic   Communication Communication Communication: HOH   Cognition Arousal/Alertness: Awake/alert   Overall Cognitive Status: Impaired/Different from baseline Area of Impairment: Memory, Awareness                               General Comments: Pt HOH but family reports he has had memory issues prior to admission and they have  worsened recently with pt getting meds to help keep him from pulling lines and trying to get OOB.                Home Living Family/patient expects to be discharged to:: Private residence Living Arrangements: Spouse/significant other Available Help at Discharge: Family;Available 24 hours/day Type of Home: House Home Access: Ramped entrance     Home Layout: One level     Bathroom Shower/Tub: Chief Strategy Officer: Handicapped height Bathroom Accessibility: No   Home Equipment: Rollator (4 wheels);Rolling Walker (2 wheels);Tub bench;BSC/3in1;Hand held shower head   Additional Comments: Pt wears 3L at baseline. Has concentrator at home      Prior Functioning/Environment Prior Level of Function : Needs assist             Mobility Comments: at SNF prior to admission, working on [employer] with RW, at baseline pt utilizes rollator or no AD ADLs Comments: at SNF prior to admission. Pt previously requiring Supervision for toileting and Min assist for LB dressing and LB bathing.        OT Problem List: Decreased strength;Decreased activity tolerance;Impaired balance (sitting and/or standing);Decreased cognition;Cardiopulmonary status limiting activity;Decreased knowledge of use of DME or AE      OT Treatment/Interventions: Self-care/ADL training;Therapeutic exercise;Energy conservation;DME and/or AE instruction;Therapeutic activities;Patient/family education;Balance training  OT Goals(Current goals can be found in the care plan section) Acute Rehab OT Goals Patient Stated Goal: Pt did not state but agreeable to working with therapy. OT Goal Formulation: With patient/family Time For Goal Achievement: 08/27/22 Potential to Achieve Goals: Good  OT Frequency: Min 1X/week       AM-PAC OT "6 Clicks" Daily Activity     Outcome Measure Help from another person eating meals?: None Help from another person taking care of personal grooming?: A Little Help from  another person toileting, which includes using toliet, bedpan, or urinal?: A Lot Help from another person bathing (including washing, rinsing, drying)?: A Lot Help from another person to put on and taking off regular upper body clothing?: A Little Help from another person to put on and taking off regular lower body clothing?: A Lot 6 Click Score: 16   End of Session Equipment Utilized During Treatment: Rolling walker (2 wheels);Oxygen Nurse Communication: Mobility status;Other (comment) (elevated HR and lower BP)  Activity Tolerance: Other (comment) (limited secondary to hypotension and increased HR) Patient left: in bed;with call bell/phone within reach  OT Visit Diagnosis: Unsteadiness on feet (R26.81);Other abnormalities of gait and mobility (R26.89);Muscle weakness (generalized) (M62.81)                Time: 8657-8469 OT Time Calculation (min): 39 min Charges:  OT General Charges $OT Visit: 1 Visit OT Evaluation $OT Eval Moderate Complexity: 1 Mod OT Treatments $Self Care/Home Management : 23-37 mins  Perrin Maltese, OTR/L Acute Rehabilitation Services  Office [phone] 08/13/2022

## 2022-08-13 NOTE — Progress Notes (Signed)
PT Cancellation Note  Patient Details Name: Gerald Valdez MRN: [id number] DOB: Jul 02, 1928   Cancelled Treatment:    Reason Eval/Treat Not Completed: (P) Other (comment) (Pt eating lunch will f/u per POC when finished.)   Oriel Ojo J Aundria Rud 08/13/2022, 1:04 PM  Bonney Leitz , PTA Acute Rehabilitation Services Office [phone]

## 2022-08-13 NOTE — Progress Notes (Signed)
Physical Therapy Treatment Patient Details Name: Gerald Valdez MRN: [id number] DOB: 1928/05/05 Today's Date: 08/13/2022   History of Present Illness 89 y/o M admitted to [facility] 7/3 for acute on chronic hypoxic hypercapneic respiratory failure and acute COPD exacerbation. PMHx: bladder cancer on chronic Foley, chronic hypoxic and hypercarbic respiratory failure on 3L O2, COPD, HFpEF, HTN, HLD, chronic anemia, CKD, a-fib on Eliquis    PT Comments  Pt supine in bed this session.  He was pleasant and agreeable to perform PTA this pm.  He was able to perform sit to stand x 2 this session with mod-max assistance.  He continues to remain limited due to elevated HR as physically he is likely able to do more.  He does fatigue quickly.       Assistance Recommended at Discharge Frequent or constant Supervision/Assistance  If plan is discharge home, recommend the following:  Can travel by private vehicle    A little help with walking and/or transfers;A little help with bathing/dressing/bathroom;Assistance with cooking/housework;Assist for transportation;Help with stairs or ramp for entrance   No  Equipment Recommendations  Hospital bed;BSC/3in1    Recommendations for Other Services       Precautions / Restrictions Precautions Precautions: Fall;Other (comment) Precaution Comments: monitor HR and O2 Restrictions Weight Bearing Restrictions: No     Mobility  Bed Mobility Overal bed mobility: Needs Assistance Bed Mobility: Supine to Sit, Sit to Supine     Supine to sit: Supervision Sit to supine: Supervision   General bed mobility comments: Pt able to move to edge and back to bed as well ass boosting with supervision for technique.    Transfers Overall transfer level: Needs assistance Equipment used: Rolling walker (2 wheels) Transfers: Sit to/from Stand Sit to Stand: Max assist, Mod assist (1st attempt was max A, 2nd attempt was mod A)           General transfer comment: Cues for  hand placement to and from seated surface this session.  He required assistance to boost into standing.  Decreased assistance on 2nd trial.    Ambulation/Gait Ambulation/Gait assistance: Mod assist Gait Distance (Feet): 3 Feet (side steps to [facility] of bed.) Assistive device: Rolling walker (2 wheels) Gait Pattern/deviations: Leaning posteriorly       General Gait Details: Only able to perform sidestepping as HR elevated to 137 BPM.   Stairs             Wheelchair Mobility     Tilt Bed    Modified Rankin (Stroke Patients Only)       Balance Overall balance assessment: Needs assistance   Sitting balance-Leahy Scale: Good       Standing balance-Leahy Scale: Poor                              Cognition Arousal/Alertness: Awake/alert Behavior During Therapy: WFL for tasks assessed/performed, Flat affect Overall Cognitive Status: Impaired/Different from baseline                                 General Comments: Pt HOH but family reports he has had memory issues prior to admission and they have worsened recently with pt getting meds to help keep him from pulling lines and trying to get OOB.        Exercises      General Comments        Pertinent  Vitals/Pain Pain Assessment Pain Assessment: Faces Faces Pain Scale: No hurt Pain Intervention(s): Monitored during session, Repositioned    Home Living                          Prior Function            PT Goals (current goals can now be found in the care plan section) Acute Rehab PT Goals Patient Stated Goal: go home Potential to Achieve Goals: Fair Progress towards PT goals: Progressing toward goals    Frequency    Min 1X/week      PT Plan Current plan remains appropriate    Co-evaluation              AM-PAC PT "6 Clicks" Mobility   Outcome Measure  Help needed turning from your back to your side while in a flat bed without using bedrails?: A  Little Help needed moving from lying on your back to sitting on the side of a flat bed without using bedrails?: A Little Help needed moving to and from a bed to a chair (including a wheelchair)?: A Lot Help needed standing up from a chair using your arms (e.g., wheelchair or bedside chair)?: A Lot Help needed to walk in hospital room?: Total Help needed climbing 3-5 steps with a railing? : Total 6 Click Score: 12    End of Session Equipment Utilized During Treatment: Gait belt;Oxygen Activity Tolerance: Patient limited by fatigue Patient left: in bed;with call bell/phone within reach;with bed alarm set;with family/visitor present Nurse Communication: Mobility status PT Visit Diagnosis: Other abnormalities of gait and mobility (R26.89);Muscle weakness (generalized) (M62.81);Difficulty in walking, not elsewhere classified (R26.2)     Time: 1610-9604 PT Time Calculation (min) (ACUTE ONLY): 20 min  Charges:    $Therapeutic Activity: 8-22 mins PT General Charges $$ ACUTE PT VISIT: 1 Visit                     Bonney Leitz , PTA Acute Rehabilitation Services Office [phone]    Trestin Vences Artis Delay 08/13/2022, 4:21 PM

## 2022-08-13 NOTE — TOC Progression Note (Signed)
Transition of Care [facility]) - Progression Note    Patient Details  Name: Gerald Valdez MRN: [id number] Date of Birth: 11/09/1928  [facility] of [facility] CM/SW Contact  Kermit Balo, RN Phone Number: 08/13/2022, 3:25 PM  Clinical Narrative:     Therapies recommend hospital bed and BSC. CM met with patient and family and they have these DME.  Family states they will provide transportation home when medically ready.  Oxygen at home is through [facility].  TOC following.  Expected Discharge Plan: Home w Home Health Services Barriers to Discharge: Continued Medical Work up  Expected Discharge Plan and Services In-house Referral: NA Discharge Planning Services: CM Consult Post Acute Care Choice: Home Health, Resumption of Svcs/PTA Provider Living arrangements for the past 2 months: Single Family Home                   DME Agency: NA       HH Arranged: RN, Disease Management, PT HH Agency: [facility] Date HH Agency Contacted: 08/11/22 Time HH Agency Contacted: 1000 Representative spoke with at [facility] Agency: Elnita Maxwell   Social Determinants of Health (SDOH) Interventions SDOH Screenings   Food Insecurity: No Food Insecurity (08/11/2022)  Housing: Low Risk  (08/11/2022)  Transportation Needs: No Transportation Needs (08/11/2022)  Utilities: Not At Risk (08/11/2022)  Tobacco Use: Medium Risk (08/11/2022)    Readmission Risk Interventions    08/11/2022   12:01 PM 07/27/2022    4:19 PM  Readmission Risk Prevention Plan  Transportation Screening Complete Complete  PCP or Specialist Appt within 3-5 Days Complete Complete  HRI or Home Care Consult Complete   Social Work Consult for Recovery Care Planning/Counseling Complete Complete  Palliative Care Screening Complete Complete  Medication Review Oceanographer) Referral to Pharmacy Complete

## 2022-08-13 NOTE — Progress Notes (Signed)
PROGRESS NOTE    Gerald Valdez  ZOX:[id number] DOB: 11-05-1928 DOA: 08/11/2022 PCP: [name], MD  89 yo male with h/o bladder cancer with chronic foley catheter, chronic hypoxemic and hypercapnic respiratory failure due to COPD on 3 L home O2, chronic systolic CHF, hypertension, CKD, cognitive deficits, suspected dementia, paroxysmal atrial fibrillation, recently hospitalized with encephalopathy, multifactorial subsequently discharged home 4 days ago. -Back in the ED, admitted with acute worsening shortness of breath and hypoxia   Subjective: -Feels better overall, appetite is improving, breathing better  Assessment and Plan:  Acute on chronic hypoxic respiratory failure -Multifactorial, he is clinically volume overloaded, also has underlying COPD/chronic respiratory failure -Weaned off BiPAP -Volume status improved, mental status more stable, will discontinue IV Lasix today, he is 4.8 L negative, volume status is improved, orthostatic with PT this morning -Significant chronic disease burden with frequent hospitalization, advanced age and frailty, palliative care involved,  -Resume oral diuretics in 1 to 2 days  Acute on chronic systolic CHF -Last echo 07/21/2022 with EF 45%, normal RV, small pericardial effusion -Has elevated JVD with edema -Improving, holding further diuretics today, orthostatic with PT this morning  Paroxysmal A-fib -In sinus rhythm at this time, was taken off anticoagulation recently on account of hematuria, bladder cancer, discussed risk benefits, family would like to hold off on resuming anticoagulation -Continue low dose Toprol  Hyponatremia -Likely from volume overloaded state, improving,   Hyperkalemia -Resolved  Dementia, cognitive deficits -At risk of ongoing delirium  Bladder cancer Recent TURBT in April Chronic Foley -UA on admission not suggestive of UTI, routine catheter care  CKD 3 A -Creatinine stable, monitor with  diuresis  COPD -Clinically do not suspect COPD exacerbation, discontinued steroids and antibiotics, continue DuoNebs  DVT prophylaxis: SCDs, Code Status: DNR Family Communication: Family at bedside Disposition Plan: Home likely 1 to 2 days  Consultants: Palliative care   Procedures:   Antimicrobials:    Objective: Vitals:   08/12/22 2007 08/13/22 0406 08/13/22 0807 08/13/22 1103  BP: (!) 115/58  113/61 (!) 104/48  Pulse: 86  88 93  Resp: 16  20   Temp: 98.8 F (37.1 C) (!) 97.5 F (36.4 C) 98.2 F (36.8 C)   TempSrc: Oral Oral Oral   SpO2: 100%  99%   Weight:  56.7 kg    Height:        Intake/Output Summary (Last 24 hours) at 08/13/2022 1232 Last data filed at 08/13/2022 8119 Gross per 24 hour  Intake 2600 ml  Output 4680 ml  Net -2080 ml   Filed Weights   08/11/22 0404 08/12/22 0423 08/13/22 0406  Weight: 62.9 kg 61.9 kg 56.7 kg    Examination:  General exam: Chronically ill male laying in bed, more awake, oriented to self and place, cognitive deficits noted HEENT: No JVD CVS: S1-S2, regular rhythm Lungs: Decreased breath sounds to bases Abdomen: Soft, nontender, bowel sounds present Extremities: No edema   Data Reviewed:   CBC: Recent Labs  Lab 08/11/22 0020 08/11/22 0031 08/11/22 0617 08/12/22 0102 08/13/22 0217  WBC 10.9*  --  8.9 13.0* 10.0  NEUTROABS 8.3*  --  7.8*  --   --   HGB 9.2* 10.9*  11.2* 8.5* 8.5* 9.0*  HCT 30.3* 32.0*  33.0* 27.7* 27.1* 28.9*  MCV 96.8  --  94.2 95.1 95.1  PLT 260  --  217 252 269   Basic Metabolic Panel: Recent Labs  Lab 08/11/22 0020 08/11/22 0031 08/11/22 0617 08/12/22 0102 08/13/22 0217  NA 123* 121*  120* 125* 126* 131*  K 5.2* 5.2*  5.1 5.3* 5.1 3.4*  CL 85* 85* 86* 85* 78*  CO2 29  --  30 31 42*  GLUCOSE 164* 163* 151* 112* 90  BUN 17 21 19 22 16   CREATININE 1.46* 1.50* 1.54* 1.53* 1.36*  CALCIUM 8.5*  --  8.5* 8.7* 8.6*  MG 1.9  --  1.8  --   --    GFR: Estimated Creatinine Clearance:  28.4 mL/min (A) (by C-G formula based on SCr of 1.36 mg/dL (H)). Liver Function Tests: Recent Labs  Lab 08/11/22 0020  AST 52*  ALT 77*  ALKPHOS 38  BILITOT 0.7  PROT 6.1*  ALBUMIN 3.5   No results for input(s): "LIPASE", "AMYLASE" in the last 168 hours. No results for input(s): "AMMONIA" in the last 168 hours. Coagulation Profile: Recent Labs  Lab 08/11/22 0020  INR 1.1   Cardiac Enzymes: No results for input(s): "CKTOTAL", "CKMB", "CKMBINDEX", "TROPONINI" in the last 168 hours. BNP (last 3 results) No results for input(s): "PROBNP" in the last 8760 hours. HbA1C: No results for input(s): "HGBA1C" in the last 72 hours. CBG: No results for input(s): "GLUCAP" in the last 168 hours.  Lipid Profile: No results for input(s): "CHOL", "HDL", "LDLCALC", "TRIG", "CHOLHDL", "LDLDIRECT" in the last 72 hours. Thyroid Function Tests: Recent Labs    08/11/22 0617  TSH 2.904   Anemia Panel: No results for input(s): "VITAMINB12", "FOLATE", "FERRITIN", "TIBC", "IRON", "RETICCTPCT" in the last 72 hours. Urine analysis:    Component Value Date/Time   COLORURINE AMBER (A) 08/11/2022 0039   APPEARANCEUR CLOUDY (A) 08/11/2022 0039   LABSPEC 1.017 08/11/2022 0039   PHURINE 6.0 08/11/2022 0039   GLUCOSEU NEGATIVE 08/11/2022 0039   HGBUR LARGE (A) 08/11/2022 0039   BILIRUBINUR NEGATIVE 08/11/2022 0039   KETONESUR NEGATIVE 08/11/2022 0039   PROTEINUR 100 (A) 08/11/2022 0039   NITRITE NEGATIVE 08/11/2022 0039   LEUKOCYTESUR TRACE (A) 08/11/2022 0039   Sepsis Labs: @LABRCNTIP (procalcitonin:4,lacticidven:4)  ) Recent Results (from the past 240 hour(s))  Blood Culture (routine x 2)     Status: None (Preliminary result)   Collection Time: 08/11/22 12:30 AM   Specimen: BLOOD RIGHT ARM  Result Value Ref Range Status   Specimen Description BLOOD RIGHT ARM  Final   Special Requests   Final    BOTTLES DRAWN AEROBIC AND ANAEROBIC Blood Culture adequate volume   Culture   Final    NO  GROWTH 2 DAYS Performed at [facility], [address] [place], [place] [address]    Report Status PENDING  Incomplete  Blood Culture (routine x 2)     Status: None (Preliminary result)   Collection Time: 08/11/22 12:36 AM   Specimen: BLOOD RIGHT HAND  Result Value Ref Range Status   Specimen Description BLOOD RIGHT HAND  Final   Special Requests   Final    BOTTLES DRAWN AEROBIC ONLY Blood Culture adequate volume   Culture   Final    NO GROWTH 2 DAYS Performed at [facility], [address] [place], [place] [address]    Report Status PENDING  Incomplete      Radiology Studies: No results found.   Scheduled Meds:  atorvastatin  20 mg Oral Daily   Chlorhexidine Gluconate Cloth  6 each Topical Q0600   escitalopram  10 mg Oral Daily   metoprolol succinate  25 mg Oral Daily   pantoprazole  40 mg Oral Q1200  potassium chloride  40 mEq Oral BID   Continuous Infusions:     LOS: 2 days    Time spent:    Zannie Cove, MD [facility]   08/13/2022, 12:32 PM

## 2022-08-13 NOTE — Progress Notes (Deleted)
5.  Greater than

## 2022-08-14 DIAGNOSIS — J9601 Acute respiratory failure with hypoxia: Secondary | ICD-10-CM | POA: Diagnosis not present

## 2022-08-14 DIAGNOSIS — J9612 Chronic respiratory failure with hypercapnia: Secondary | ICD-10-CM | POA: Diagnosis not present

## 2022-08-14 LAB — BASIC METABOLIC PANEL
Anion gap: 6 (ref 5–15)
BUN: 15 mg/dL (ref 8–23)
CO2: 43 mmol/L — ABNORMAL HIGH (ref 22–32)
Calcium: 8.7 mg/dL — ABNORMAL LOW (ref 8.9–10.3)
Chloride: 85 mmol/L — ABNORMAL LOW (ref 98–111)
Creatinine, Ser: 1.43 mg/dL — ABNORMAL HIGH (ref 0.61–1.24)
GFR, Estimated: 46 mL/min — ABNORMAL LOW (ref >60)
Glucose, Bld: 106 mg/dL — ABNORMAL HIGH (ref 70–99)
Potassium: 4.5 mmol/L (ref 3.5–5.1)
Sodium: 134 mmol/L — ABNORMAL LOW (ref 135–145)

## 2022-08-14 LAB — CULTURE, BLOOD (ROUTINE X 2)

## 2022-08-14 MED ORDER — METOPROLOL SUCCINATE ER 50 MG PO TB24
50.0000 mg | ORAL_TABLET | Freq: Every day | ORAL | Status: DC
  Administered 2022-08-14 – 2022-08-15 (×2): 50 mg via ORAL
  Filled 2022-08-14 (×2): qty 1

## 2022-08-14 MED ORDER — FUROSEMIDE 40 MG PO TABS
40.0000 mg | ORAL_TABLET | Freq: Every day | ORAL | Status: DC
  Administered 2022-08-14 – 2022-08-15 (×2): 40 mg via ORAL
  Filled 2022-08-14 (×2): qty 1

## 2022-08-14 NOTE — TOC Progression Note (Signed)
Transition of Care [facility]) - Progression Note    Patient Details  Name: Gerald Valdez MRN: [id number] Date of Birth: 1928/06/18  Transition of Care [facility]) CM/SW Contact  Ronny Bacon, RN Phone Number: 08/14/2022, 10:33 AM  Clinical Narrative:  Spoke with daughter Irene Limbo, request for Hospital bed as patient does not have a hospital bed. Daughter reports, hospital bed family has belongs to someone else and is not at patient house. Hospital bed arrangement made through [facility]. HH PT/RN services confirmed through [employer].    Expected Discharge Plan: Home w Home Health Services Barriers to Discharge: Continued Medical Work up  Expected Discharge Plan and Services In-house Referral: NA Discharge Planning Services: CM Consult Post Acute Care Choice: Home Health, Resumption of Svcs/PTA Provider Living arrangements for the past 2 months: Single Family Home                   DME Agency: NA       HH Arranged: RN, Disease Management, PT HH Agency: [facility] Date HH Agency Contacted: 08/11/22 Time HH Agency Contacted: 1000 Representative spoke with at [facility] Agency: Elnita Maxwell   Social Determinants of Health (SDOH) Interventions SDOH Screenings   Food Insecurity: No Food Insecurity (08/11/2022)  Housing: Low Risk  (08/11/2022)  Transportation Needs: No Transportation Needs (08/11/2022)  Utilities: Not At Risk (08/11/2022)  Tobacco Use: Medium Risk (08/11/2022)    Readmission Risk Interventions    08/11/2022   12:01 PM 07/27/2022    4:19 PM  Readmission Risk Prevention Plan  Transportation Screening Complete Complete  PCP or Specialist Appt within 3-5 Days Complete Complete  HRI or Home Care Consult Complete   Social Work Consult for Recovery Care Planning/Counseling Complete Complete  Palliative Care Screening Complete Complete  Medication Review [employer]) Referral to Pharmacy Complete

## 2022-08-14 NOTE — Progress Notes (Addendum)
PROGRESS NOTE    Gerald Valdez  WUJ:[id number] DOB: 1928/11/02 DOA: 08/11/2022 PCP: [name], MD  89 yo male with h/o bladder cancer with chronic foley catheter, chronic hypoxemic and hypercapnic respiratory failure due to COPD on 3 L home O2, chronic systolic CHF, hypertension, CKD, cognitive deficits, suspected dementia, paroxysmal atrial fibrillation, recently hospitalized with encephalopathy, multifactorial subsequently discharged home 4 days ago. -Back in the ED, admitted with acute worsening shortness of breath and hypoxia   Subjective: -Feels better overall, blood pressure dropped with OT yesterday  Assessment and Plan:  Acute on chronic hypoxic respiratory failure -Multifactorial, he is clinically volume overloaded, also has underlying COPD/chronic respiratory failure -Weaned off BiPAP -Volume status improved, mental status improved -Significant chronic disease burden with frequent hospitalization, advanced age and frailty, palliative care involved,  -Discharge planning, home tomorrow if stable while  Acute on chronic systolic CHF -Last echo 07/21/2022 with EF 45%, normal RV, small pericardial effusion -Status has improved, he is 6 L negative, weight down 13 LB's, -Start oral Lasix today, GDMT limited by frailty and soft blood pressures  Paroxysmal A-fib -In sinus rhythm at this time, was taken off anticoagulation recently on account of hematuria, bladder cancer, discussed risk benefits, family would like to hold off on resuming anticoagulation -Continue low dose Toprol  Hyponatremia -Likely from volume overloaded state, improved  Hyperkalemia -Resolved  Dementia, cognitive deficits -At risk of ongoing delirium  Bladder cancer Recent TURBT in April Chronic Foley -UA on admission not suggestive of UTI, routine catheter care  CKD 3 A -Creatinine stable, monitor with diuresis  COPD -Clinically do not suspect COPD exacerbation, discontinued steroids and  antibiotics, continue DuoNebs  DVT prophylaxis: SCDs, Code Status: DNR Family Communication: Family at bedside Disposition Plan: Home likely 1 to 2 days  Consultants: Palliative care   Procedures:   Antimicrobials:    Objective: Vitals:   08/14/22 0500 08/14/22 0727 08/14/22 0900 08/14/22 1205  BP: 130/78 (!) 118/56  119/73  Pulse: (!) 102 (!) 102 (!) 119 93  Resp: 14 (!) 26 18 (!) 25  Temp: 98.3 F (36.8 C) 98.2 F (36.8 C)  97.7 F (36.5 C)  TempSrc: Oral Oral  Oral  SpO2: 93%  93% 96%  Weight:      Height:        Intake/Output Summary (Last 24 hours) at 08/14/2022 1347 Last data filed at 08/14/2022 1008 Gross per 24 hour  Intake 120 ml  Output 850 ml  Net -730 ml   Filed Weights   08/11/22 0404 08/12/22 0423 08/13/22 0406  Weight: 62.9 kg 61.9 kg 56.7 kg    Examination:  General exam: Chronically ill male laying in bed, more awake, oriented to self and place, cognitive deficits noted HEENT: No JVD CVS: S1-S2, regular rhythm Lungs: Decreased breath sounds to bases Abdomen: Soft, nontender, bowel sounds present Extremities: No edema   Data Reviewed:   CBC: Recent Labs  Lab 08/11/22 0020 08/11/22 0031 08/11/22 0617 08/12/22 0102 08/13/22 0217  WBC 10.9*  --  8.9 13.0* 10.0  NEUTROABS 8.3*  --  7.8*  --   --   HGB 9.2* 10.9*  11.2* 8.5* 8.5* 9.0*  HCT 30.3* 32.0*  33.0* 27.7* 27.1* 28.9*  MCV 96.8  --  94.2 95.1 95.1  PLT 260  --  217 252 269   Basic Metabolic Panel: Recent Labs  Lab 08/11/22 0020 08/11/22 0031 08/11/22 0617 08/12/22 0102 08/13/22 0217 08/14/22 0223  NA 123* 121*  120* 125* 126*  131* 134*  K 5.2* 5.2*  5.1 5.3* 5.1 3.4* 4.5  CL 85* 85* 86* 85* 78* 85*  CO2 29  --  30 31 42* 43*  GLUCOSE 164* 163* 151* 112* 90 106*  BUN 17 21 19 22 16 15   CREATININE 1.46* 1.50* 1.54* 1.53* 1.36* 1.43*  CALCIUM 8.5*  --  8.5* 8.7* 8.6* 8.7*  MG 1.9  --  1.8  --   --   --    GFR: Estimated Creatinine Clearance: 27 mL/min (A) (by C-G  formula based on SCr of 1.43 mg/dL (H)). Liver Function Tests: Recent Labs  Lab 08/11/22 0020  AST 52*  ALT 77*  ALKPHOS 38  BILITOT 0.7  PROT 6.1*  ALBUMIN 3.5   No results for input(s): "LIPASE", "AMYLASE" in the last 168 hours. No results for input(s): "AMMONIA" in the last 168 hours. Coagulation Profile: Recent Labs  Lab 08/11/22 0020  INR 1.1   Cardiac Enzymes: No results for input(s): "CKTOTAL", "CKMB", "CKMBINDEX", "TROPONINI" in the last 168 hours. BNP (last 3 results) No results for input(s): "PROBNP" in the last 8760 hours. HbA1C: No results for input(s): "HGBA1C" in the last 72 hours. CBG: No results for input(s): "GLUCAP" in the last 168 hours.  Lipid Profile: No results for input(s): "CHOL", "HDL", "LDLCALC", "TRIG", "CHOLHDL", "LDLDIRECT" in the last 72 hours. Thyroid Function Tests: No results for input(s): "TSH", "T4TOTAL", "FREET4", "T3FREE", "THYROIDAB" in the last 72 hours.  Anemia Panel: No results for input(s): "VITAMINB12", "FOLATE", "FERRITIN", "TIBC", "IRON", "RETICCTPCT" in the last 72 hours. Urine analysis:    Component Value Date/Time   COLORURINE AMBER (A) 08/11/2022 0039   APPEARANCEUR CLOUDY (A) 08/11/2022 0039   LABSPEC 1.017 08/11/2022 0039   PHURINE 6.0 08/11/2022 0039   GLUCOSEU NEGATIVE 08/11/2022 0039   HGBUR LARGE (A) 08/11/2022 0039   BILIRUBINUR NEGATIVE 08/11/2022 0039   KETONESUR NEGATIVE 08/11/2022 0039   PROTEINUR 100 (A) 08/11/2022 0039   NITRITE NEGATIVE 08/11/2022 0039   LEUKOCYTESUR TRACE (A) 08/11/2022 0039   Sepsis Labs: @LABRCNTIP (procalcitonin:4,lacticidven:4)  ) Recent Results (from the past 240 hour(s))  Blood Culture (routine x 2)     Status: None (Preliminary result)   Collection Time: 08/11/22 12:30 AM   Specimen: BLOOD RIGHT ARM  Result Value Ref Range Status   Specimen Description BLOOD RIGHT ARM  Final   Special Requests   Final    BOTTLES DRAWN AEROBIC AND ANAEROBIC Blood Culture adequate volume    Culture   Final    NO GROWTH 3 DAYS Performed at [facility], [address] [place], [place] [address]    Report Status PENDING  Incomplete  Blood Culture (routine x 2)     Status: None (Preliminary result)   Collection Time: 08/11/22 12:36 AM   Specimen: BLOOD RIGHT HAND  Result Value Ref Range Status   Specimen Description BLOOD RIGHT HAND  Final   Special Requests   Final    BOTTLES DRAWN AEROBIC ONLY Blood Culture adequate volume   Culture   Final    NO GROWTH 3 DAYS Performed at [facility], [address], [place], [place] [address]    Report Status PENDING  Incomplete      Radiology Studies: No results found.   Scheduled Meds:  atorvastatin  20 mg Oral Daily   Chlorhexidine Gluconate Cloth  6 each Topical Q0600   escitalopram  10 mg Oral Daily   furosemide  40 mg Oral Daily   metoprolol  succinate  50 mg Oral Daily   pantoprazole  40 mg Oral Q1200   Continuous Infusions:     LOS: 3 days    Time spent:    Zannie Cove, MD [facility]   08/14/2022, 1:47 PM

## 2022-08-15 DIAGNOSIS — J9612 Chronic respiratory failure with hypercapnia: Secondary | ICD-10-CM | POA: Diagnosis not present

## 2022-08-15 DIAGNOSIS — J9601 Acute respiratory failure with hypoxia: Secondary | ICD-10-CM | POA: Diagnosis not present

## 2022-08-15 LAB — BASIC METABOLIC PANEL
Anion gap: 9 (ref 5–15)
BUN: 16 mg/dL (ref 8–23)
CO2: 40 mmol/L — ABNORMAL HIGH (ref 22–32)
Calcium: 8.7 mg/dL — ABNORMAL LOW (ref 8.9–10.3)
Chloride: 86 mmol/L — ABNORMAL LOW (ref 98–111)
Creatinine, Ser: 1.4 mg/dL — ABNORMAL HIGH (ref 0.61–1.24)
GFR, Estimated: 47 mL/min — ABNORMAL LOW (ref >60)
Glucose, Bld: 105 mg/dL — ABNORMAL HIGH (ref 70–99)
Potassium: 3.8 mmol/L (ref 3.5–5.1)
Sodium: 135 mmol/L (ref 135–145)

## 2022-08-15 LAB — CULTURE, BLOOD (ROUTINE X 2)
Culture: NO GROWTH
Special Requests: ADEQUATE

## 2022-08-15 MED ORDER — ALBUTEROL SULFATE (2.5 MG/3ML) 0.083% IN NEBU: 2.5000 mg | INHALATION_SOLUTION | Freq: Four times a day (QID) | RESPIRATORY_TRACT | 0 refills | Status: DC | PRN

## 2022-08-15 MED ORDER — POTASSIUM CHLORIDE CRYS ER 20 MEQ PO TBCR: 20.0000 meq | EXTENDED_RELEASE_TABLET | Freq: Every day | ORAL | 1 refills | Status: DC

## 2022-08-15 MED ORDER — METOPROLOL SUCCINATE ER 25 MG PO TB24: 50.0000 mg | ORAL_TABLET | Freq: Every day | ORAL | 1 refills | Status: DC

## 2022-08-15 MED ORDER — FUROSEMIDE 40 MG PO TABS: 40.0000 mg | ORAL_TABLET | Freq: Every day | ORAL | 1 refills | Status: DC

## 2022-08-15 NOTE — Care Management (Signed)
    Durable Medical Equipment  (From admission, onward)           Start     Ordered   08/15/22 1133  For home use only DME Nebulizer machine  Once       Question Answer Comment  Patient needs a nebulizer to treat with the following condition COPD (chronic obstructive pulmonary disease) (HCC)   Length of Need Lifetime      08/15/22 1133   08/15/22 1102  For home use only DME Nebulizer machine  Once       Question Answer Comment  Patient needs a nebulizer to treat with the following condition COPD exacerbation (HCC)   Length of Need 6 Months      08/15/22 1101   08/14/22 1349  For home use only DME Hospital bed  Once       Question Answer Comment  Length of Need 6 Months   Patient has (list medical condition): Therapeutic mattress   The above medical condition requires: Patient requires the ability to reposition frequently   Bed type Semi-electric      08/14/22 1349

## 2022-08-15 NOTE — Progress Notes (Signed)
Discharge delayed d/t pt family asking to add on PRN nebulizer and needing to have nebulizer system delivered to room.

## 2022-08-15 NOTE — Discharge Summary (Signed)
Physician Discharge Summary  Gerald Valdez:[id number] DOB: May 10, 1928 DOA: 08/11/2022  PCP: Gerald Greathouse, MD  Admit date: 08/11/2022 Discharge date: 08/15/2022  Time spent: 45 minutes  Recommendations for Outpatient Follow-up:  Outpatient palliative care PCP in 1 week, please check BMP at follow-up Routine catheter care  Discharge Diagnoses:  Principal Problem:   Acute hypoxic on chronic hypercapnic respiratory failure (HCC) Acute on chronic systolic CHF   Acute metabolic encephalopathy Acute on chronic systolic CHF preserved EF 40 to 45% (congestive heart failure) (HCC) Bladder cancer, TURBT Chronic Foley Mild dementia   Essential hypertension   Hyperlipidemia   CKD stage 3a, GFR 45-59 ml/min (HCC)   DNR (do not resuscitate)   Hyponatremia   Hyperkalemia   COPD with acute exacerbation (HCC)   Acute respiratory failure with hypercapnia (HCC)   Discharge Condition: Improved  Diet recommendation: Low-sodium, heart healthy  Filed Weights   08/12/22 0423 08/13/22 0406 08/15/22 0408  Weight: 61.9 kg 56.7 kg 54.7 kg    History of present illness:  89 yo male with h/o bladder cancer with chronic foley catheter, chronic hypoxemic and hypercapnic respiratory failure due to COPD on 3 L home O2, chronic systolic CHF, hypertension, CKD, cognitive deficits, suspected dementia, paroxysmal atrial fibrillation, recently hospitalized with encephalopathy, multifactorial subsequently discharged home 4 days ago. -Back in the ED, admitted with acute worsening shortness of breath and hypoxia  Hospital Course:   Acute on chronic hypoxic respiratory failure -Multifactorial, volume overloaded on admission, also has underlying COPD/chronic respiratory failure -Weaned off BiPAP -Volume status improved, mental status improved -Significant chronic disease burden with frequent hospitalization, advanced age and frailty, palliative care involved, family meeting completed, remains DNR,  outpatient palliative care -Anticipate need for hospice down the road   Acute on chronic systolic CHF -Last echo 07/21/2022 with EF 45%, normal RV, small pericardial effusion -Volume status has improved with IV Lasix, he is 7.3 L negative -Transition back to Lasix 40 Mg daily, GDMT limited by frailty and soft blood pressures -Outpatient palliative care follow-up   Paroxysmal A-fib -In sinus rhythm at this time, was taken off anticoagulation recently on account of hematuria, bladder cancer, discussed risk benefits, family would like to hold off on resuming anticoagulation -Continue low dose Toprol   Hyponatremia -Likely from volume overloaded state, improved   Hyperkalemia -Resolved   Dementia, cognitive deficits -At risk of ongoing delirium   Bladder cancer Recent TURBT in April Chronic Foley -UA on admission not suggestive of UTI, routine catheter care   CKD 3 A -Creatinine stable, monitor with diuresis   COPD -Clinically do not suspect COPD exacerbation, discontinued steroids and antibiotics, continue DuoNebs   Code Status: DNR  Discharge Exam: Vitals:   08/15/22 0735 08/15/22 0736  BP: 127/74   Pulse: 90 94  Resp: (!) 26 17  Temp: 97.7 F (36.5 C)   SpO2: 96% 98%   General exam: Chronically ill male laying in bed, more awake, oriented to self and place, cognitive deficits noted HEENT: No JVD CVS: S1-S2, regular rhythm Lungs: Decreased breath sounds to bases Abdomen: Soft, nontender, bowel sounds present Extremities: No edema   Discharge Instructions   Discharge Instructions     Diet - low sodium heart healthy   Complete by: As directed    Discharge wound care:   Complete by: As directed    routine   Increase activity slowly   Complete by: As directed       Allergies as of 08/15/2022   No Known  Allergies      Medication List     TAKE these medications    albuterol (2.5 MG/3ML) 0.083% nebulizer solution Commonly known as: PROVENTIL Take 3  mLs (2.5 mg total) by nebulization every 6 (six) hours as needed for wheezing or shortness of breath.   ascorbic acid 500 MG tablet Commonly known as: VITAMIN C Take 500 mg by mouth daily.   atorvastatin 40 MG tablet Commonly known as: LIPITOR Take 1 tablet (40 mg total) by mouth daily.   escitalopram 10 MG tablet Commonly known as: LEXAPRO Take 10 mg by mouth daily.   feeding supplement Liqd Take 237 mLs by mouth 2 (two) times daily between meals.   furosemide 40 MG tablet Commonly known as: LASIX Take 1 tablet (40 mg total) by mouth daily.   loratadine 10 MG tablet Commonly known as: CLARITIN Take 10 mg by mouth daily.   metoprolol succinate 25 MG 24 hr tablet Commonly known as: TOPROL-XL Take 2 tablets (50 mg total) by mouth daily. What changed: how much to take   multivitamin with minerals Tabs tablet Take 1 tablet by mouth daily with breakfast.   OXYGEN Inhale 3 L into the lungs continuous.   pantoprazole 40 MG tablet Commonly known as: PROTONIX Take 1 tablet (40 mg total) by mouth 2 (two) times daily before a meal.   polyethylene glycol 17 g packet Commonly known as: MIRALAX / GLYCOLAX Take 17 g by mouth daily as needed for mild constipation.   potassium chloride SA 20 MEQ tablet Commonly known as: KLOR-CON M Take 1 tablet (20 mEq total) by mouth daily.   saccharomyces boulardii 250 MG capsule Commonly known as: FLORASTOR Take 1 capsule (250 mg total) by mouth 2 (two) times daily.   tamsulosin 0.4 MG Caps capsule Commonly known as: FLOMAX Take 1 capsule (0.4 mg total) by mouth daily.   umeclidinium-vilanterol 62.5-25 MCG/ACT Aepb Commonly known as: ANORO ELLIPTA Inhale 1 puff into the lungs daily.               Durable Medical Equipment  (From admission, onward)           Start     Ordered   08/15/22 1102  For home use only DME Nebulizer machine  Once       Question Answer Comment  Patient needs a nebulizer to treat with the  following condition COPD exacerbation (HCC)   Length of Need 6 Months      08/15/22 1101   08/14/22 1349  For home use only DME Hospital bed  Once       Question Answer Comment  Length of Need 6 Months   Patient has (list medical condition): Therapeutic mattress   The above medical condition requires: Patient requires the ability to reposition frequently   Bed type Semi-electric      08/14/22 1349              Discharge Care Instructions  (From admission, onward)           Start     Ordered   08/15/22 0000  Discharge wound care:       Comments: routine   08/15/22 0759           No Known Allergies  Follow-up Information     [facility] Follow up.   Why: Physical therapy and Occupational therapy. They will call to arrange follow up visit after discharge Contact information: [address] [place] [address]  [phone]                  The results of significant diagnostics from this hospitalization (including imaging, microbiology, ancillary and laboratory) are listed below for reference.    Significant Diagnostic Studies: DG Chest Port 1 View  Result Date: 08/11/2022 CLINICAL DATA:  Possible sepsis EXAM: PORTABLE CHEST 1 VIEW COMPARISON:  08/03/2022 FINDINGS: Cardiac shadow is enlarged. Aortic calcifications are again seen. Lungs are well aerated without focal infiltrate or effusion. No bony abnormality is noted. IMPRESSION: No active disease. Electronically Signed   By: Alcide Clever M.D.   On: 08/11/2022 00:30   DG Chest Port 1 View  Result Date: 08/03/2022 CLINICAL DATA:  89 year old male with history of shortness of breath. EXAM: PORTABLE CHEST 1 VIEW COMPARISON:  Chest x-ray 08/31/2022. FINDINGS: Poorly defined opacity in the right lung base partially obscuring the right hemidiaphragm, which may reflect some airspace consolidation and/or presence of a small right pleural effusion. Left lung is clear. No left pleural  effusion. No pneumothorax. No evidence of pulmonary edema. Heart size is normal. Upper mediastinal contours are within normal limits. Atherosclerotic calcifications are noted in the thoracic aorta. IMPRESSION: 1. New opacity in the right lung base partially obscuring the right hemidiaphragm concerning for developing right lower lobe airspace consolidation with small superimposed right pleural effusion. 2. Aortic atherosclerosis. Electronically Signed   By: Trudie Reed M.D.   On: 08/03/2022 06:16   EEG adult  Result Date: 08/02/2022 Charlsie Quest, MD     08/02/2022  1:51 PM Patient Name: TORIVIO VIROLA MRN: [id number] Epilepsy Attending: Charlsie Quest Referring Physician/Provider: Tereasa Coop, MD Date: 08/02/2022 Duration: 24.47 mins Patient history: 89yo M with 1 episode of seizure-like activity at nursing home and en route to EMS for seizure-like episode. EMS reported bilateral hand tremor. EEG to evaluate for seizure. Level of alertness: Awake, asleep AEDs during EEG study: None Technical aspects: This EEG study was done with scalp electrodes positioned according to the 10-20 International system of electrode placement. Electrical activity was reviewed with band pass filter of 1-70Hz , sensitivity of 7 uV/mm, display speed of 31mm/sec with a 60Hz  notched filter applied as appropriate. EEG data were recorded continuously and digitally stored.  Video monitoring was available and reviewed as appropriate. Description: No posterior dominant rhythm was seen. Sleep was characterized by vertex waves, sleep spindles (12 to 14 Hz), maximal frontocentral region. EEG showed continuous generalized 3 to 6 Hz theta-delta slowing. Hyperventilation and photic stimulation were not performed.   ABNORMALITY - Continuous slow, generalized IMPRESSION: This study is suggestive of moderate diffuse encephalopathy, nonspecific etiology. No seizures or epileptiform discharges were seen throughout the recording. Charlsie Quest   CT ABDOMEN PELVIS W CONTRAST  Result Date: 08/01/2022 CLINICAL DATA:  Hematuria EXAM: CT ABDOMEN AND PELVIS WITH CONTRAST TECHNIQUE: Multidetector CT imaging of the abdomen and pelvis was performed using the standard protocol following bolus administration of intravenous contrast. RADIATION DOSE REDUCTION: This exam was performed according to the departmental dose-optimization program which includes automated exposure control, adjustment of the mA and/or kV according to patient size and/or use of iterative reconstruction technique. CONTRAST:  60mL OMNIPAQUE IOHEXOL 350 MG/ML SOLN COMPARISON:  CT abdomen and pelvis 07/01/2021 FINDINGS: Lower chest: Emphysema. Small right pleural effusion and atelectasis. Hepatobiliary: No focal liver abnormality is seen. No gallstones, gallbladder wall thickening, or biliary dilatation. Pancreas: Unremarkable. Spleen: Unremarkable. Adrenals/Urinary Tract: Normal adrenal glands. Bilateral cortical renal scarring. No urinary calculi or hydronephrosis. Foley  catheter in the bladder. Locules of gas within the bladder. There is hyperdense fluid within the bladder which may represent blood products. The bladder wall is irregular and thickened with mild adjacent stranding compatible with cystitis. Stomach/Bowel: Normal caliber large and small bowel. Extensive sigmoid diverticulosis without evidence of diverticulitis. Moderate stool ball in the rectum. Stomach is within normal limits. Vascular/Lymphatic: Aortic atherosclerosis. No enlarged abdominal or pelvic lymph nodes. Reproductive: No acute abnormality. Other: No free intraperitoneal fluid or air. Musculoskeletal: Demineralization.  No acute fracture. IMPRESSION: 1. Findings suspicious for hemorrhagic cystitis. 2. Small right pleural effusion and atelectasis. Aortic Atherosclerosis (ICD10-I70.0). Electronically Signed   By: Minerva Fester M.D.   On: 08/01/2022 23:59   MR BRAIN WO CONTRAST  Result Date:  08/01/2022 CLINICAL DATA:  Delirium EXAM: MRI HEAD WITHOUT CONTRAST TECHNIQUE: Multiplanar, multiecho pulse sequences of the brain and surrounding structures were obtained without intravenous contrast. COMPARISON:  None Available. FINDINGS: Brain: No acute infarct, mass effect or extra-axial collection. No acute or chronic hemorrhage. There is confluent hyperintense T2-weighted signal within the white matter. Generalized volume loss. The midline structures are normal. Vascular: Major flow voids are preserved. Skull and upper cervical spine: Normal calvarium and skull base. Visualized upper cervical spine and soft tissues are normal. Sinuses/Orbits:No paranasal sinus fluid levels or advanced mucosal thickening. Small amount of left mastoid fluid. There are bilateral lens replacements. IMPRESSION: 1. No acute intracranial abnormality. 2. Chronic small vessel ischemia and volume loss. Electronically Signed   By: Deatra Robinson M.D.   On: 08/01/2022 22:06   CT Head Wo Contrast  Result Date: 08/01/2022 CLINICAL DATA:  Altered level of consciousness EXAM: CT HEAD WITHOUT CONTRAST TECHNIQUE: Contiguous axial images were obtained from the base of the skull through the vertex without intravenous contrast. RADIATION DOSE REDUCTION: This exam was performed according to the departmental dose-optimization program which includes automated exposure control, adjustment of the mA and/or kV according to patient size and/or use of iterative reconstruction technique. COMPARISON:  None Available. FINDINGS: Brain: Evaluation is limited by streak artifact. No evidence of acute infarct or hemorrhage. Chronic appearing small vessel ischemic changes are seen throughout the periventricular white matter. The lateral ventricles and midline structures are unremarkable. No acute extra-axial fluid collections. No mass effect. Vascular: No hyperdense vessel or unexpected calcification. Skull: Normal. Negative for fracture or focal lesion.  Sinuses/Orbits: No acute finding. Other: None. IMPRESSION: 1. No acute intracranial process. Electronically Signed   By: Sharlet Salina M.D.   On: 08/01/2022 15:23   DG Chest Port 1 View  Result Date: 08/01/2022 CLINICAL DATA:  Weakness EXAM: PORTABLE CHEST 1 VIEW COMPARISON:  07/19/2022 FINDINGS: Stable cardiomediastinal contours. Aortic atherosclerosis. Improved aeration at the right lung base. No new focal airspace consolidation. No pleural effusion or pneumothorax. IMPRESSION: No active disease. Electronically Signed   By: Duanne Guess D.O.   On: 08/01/2022 14:39   ECHOCARDIOGRAM COMPLETE  Result Date: 07/21/2022    ECHOCARDIOGRAM REPORT   Patient Name:   GUERIN VELARDE Date of Exam: 07/21/2022 Medical Rec #:  [id number]       Height:       66.0 in Accession #:    [id number]      Weight:       145.9 lb Date of Birth:  07-14-1928       BSA:          1.749 m Patient Age:    89 years        BP:  108/53 mmHg Patient Gender: M               HR:           97 bpm. Exam Location:  Inpatient Procedure: 2D Echo, Cardiac Doppler and Color Doppler Indications:    CHF-Acute Diastolic I50.31  History:        Patient has prior history of Echocardiogram examinations, most                 recent 02/11/2020. CKD 3 and COPD; Risk Factors:Hypertension,                 Dyslipidemia and Former Smoker.  Sonographer:    Dondra Prader RVT RCS Referring Phys: [id number] EMILY B MULLEN  Sonographer Comments: Technically challenging study due to limited acoustic windows, Technically difficult study due to poor echo windows, suboptimal parasternal window, suboptimal apical window and suboptimal subcostal window. Image acquisition challenging due to respiratory motion, Image acquisition challenging due to patient body habitus and Image acquisition challenging due to uncooperative patient. IMPRESSIONS  1. Left ventricular ejection fraction, by estimation, is 45 to 50%. The left ventricle has mildly decreased function. The left  ventricle has no regional wall motion abnormalities. There is mild left ventricular hypertrophy. Left ventricular diastolic parameters are indeterminate.  2. Right ventricular systolic function is normal. The right ventricular size is mildly enlarged.  3. Right atrial size was mildly dilated.  4. A small pericardial effusion is present. The pericardial effusion is anterior to the right ventricle. There is no evidence of cardiac tamponade.  5. The mitral valve is degenerative. Mild to moderate mitral valve regurgitation.  6. The aortic valve was not well visualized. Aortic valve regurgitation is not visualized. Comparison(s): Prior images reviewed side by side. LVEF appears slightly decreased; this study is more technically difficult than prior. FINDINGS  Left Ventricle: LVOT calcification unchanged from prior. Left ventricular ejection fraction, by estimation, is 45 to 50%. The left ventricle has mildly decreased function. The left ventricle has no regional wall motion abnormalities. The left ventricular internal cavity size was normal in size. There is mild left ventricular hypertrophy. Left ventricular diastolic parameters are indeterminate. Right Ventricle: The right ventricular size is mildly enlarged. No increase in right ventricular wall thickness. Right ventricular systolic function is normal. Left Atrium: Left atrial size was normal in size. Right Atrium: Right atrial size was mildly dilated. Pericardium: A small pericardial effusion is present. The pericardial effusion is anterior to the right ventricle. There is no evidence of cardiac tamponade. Mitral Valve: The mitral valve is degenerative in appearance. Mild to moderate mitral valve regurgitation. Tricuspid Valve: The tricuspid valve is normal in structure. Tricuspid valve regurgitation is mild . No evidence of tricuspid stenosis. Aortic Valve: The aortic valve was not well visualized. Aortic valve regurgitation is not visualized. Aortic valve mean  gradient measures 4.0 mmHg. Aortic valve peak gradient measures 6.7 mmHg. Aortic valve area, by VTI measures 1.77 cm. Pulmonic Valve: The pulmonic valve was not well visualized. Pulmonic valve regurgitation is not visualized. Aorta: The aortic root is normal in size and structure and the ascending aorta was not well visualized. IAS/Shunts: No atrial level shunt detected by color flow Doppler.  LEFT VENTRICLE PLAX 2D LVIDd:         4.20 cm   Diastology LVIDs:         3.30 cm   LV e' medial:    6.50 cm/s LV PW:  1.10 cm   LV E/e' medial:  10.7 LV IVS:        0.90 cm   LV e' lateral:   8.18 cm/s LVOT diam:     1.90 cm   LV E/e' lateral: 8.5 LV SV:         38 LV SV Index:   22 LVOT Area:     2.84 cm  RIGHT VENTRICLE             IVC RV Basal diam:  3.90 cm     IVC diam: 2.50 cm RV S prime:     11.70 cm/s TAPSE (M-mode): 1.7 cm LEFT ATRIUM           Index        RIGHT ATRIUM           Index LA diam:      3.90 cm 2.23 cm/m   RA Area:     18.20 cm LA Vol (A2C): 48.6 ml 27.79 ml/m  RA Volume:   56.90 ml  32.53 ml/m LA Vol (A4C): 37.5 ml 21.44 ml/m  AORTIC VALVE AV Area (Vmax):    1.62 cm AV Area (Vmean):   1.57 cm AV Area (VTI):     1.77 cm AV Vmax:           129.00 cm/s AV Vmean:          88.200 cm/s AV VTI:            0.216 m AV Peak Grad:      6.7 mmHg AV Mean Grad:      4.0 mmHg LVOT Vmax:         73.70 cm/s LVOT Vmean:        48.800 cm/s LVOT VTI:          0.135 m LVOT/AV VTI ratio: 0.62  AORTA Ao Root diam: 3.40 cm MITRAL VALVE               TRICUSPID VALVE MV Area (PHT): 4.67 cm    TR Peak grad:   30.2 mmHg MV Decel Time: 163 msec    TR Vmax:        275.00 cm/s MV E velocity: 69.85 cm/s MV A velocity: 61.70 cm/s  SHUNTS MV E/A ratio:  1.13        Systemic VTI:  0.14 m                            Systemic Diam: 1.90 cm Riley Lam MD Electronically signed by Riley Lam MD Signature Date/Time: 07/21/2022/5:42:19 PM    Final    DG Chest Portable 1 View  Result Date:  07/19/2022 CLINICAL DATA:  Shortness of breath, altered mental status. EXAM: PORTABLE CHEST 1 VIEW COMPARISON:  Chest radiograph 06/27/2021 FINDINGS: The cardiomediastinal silhouette is normal. Coarsened interstitial markings in both lungs are unchanged, chronic. There is hazy opacity in the right lower lobe and a probable small right pleural effusion. There is no other focal airspace opacity. There is no significant left effusion. There is no pneumothorax There is no acute osseous abnormality. IMPRESSION: Hazy right lower lobe opacity may reflect right lower lobe pneumonia and small parapneumonic effusion in the correct clinical setting. Electronically Signed   By: Lesia Hausen M.D.   On: 07/19/2022 17:26    Microbiology: Recent Results (from the past 240 hour(s))  Blood Culture (routine x 2)     Status: None (Preliminary result)  Collection Time: 08/11/22 12:30 AM   Specimen: BLOOD RIGHT ARM  Result Value Ref Range Status   Specimen Description BLOOD RIGHT ARM  Final   Special Requests   Final    BOTTLES DRAWN AEROBIC AND ANAEROBIC Blood Culture adequate volume   Culture   Final    NO GROWTH 4 DAYS Performed at [facility], [address] [place], [place] [address]    Report Status PENDING  Incomplete  Blood Culture (routine x 2)     Status: None (Preliminary result)   Collection Time: 08/11/22 12:36 AM   Specimen: BLOOD RIGHT HAND  Result Value Ref Range Status   Specimen Description BLOOD RIGHT HAND  Final   Special Requests   Final    BOTTLES DRAWN AEROBIC ONLY Blood Culture adequate volume   Culture   Final    NO GROWTH 4 DAYS Performed at [facility], [address] [place], [place] [address]    Report Status PENDING  Incomplete     Labs: Basic Metabolic Panel: Recent Labs  Lab 08/11/22 0020 08/11/22 0031 08/11/22 4696 08/12/22 0102 08/13/22 0217 08/14/22 0223 08/15/22 0214  NA 123*   < > 125* 126* 131* 134* 135  K 5.2*   < > 5.3* 5.1 3.4* 4.5 3.8   CL 85*   < > 86* 85* 78* 85* 86*  CO2 29  --  30 31 42* 43* 40*  GLUCOSE 164*   < > 151* 112* 90 106* 105*  BUN 17   < > 19 22 16 15 16   CREATININE 1.46*   < > 1.54* 1.53* 1.36* 1.43* 1.40*  CALCIUM 8.5*  --  8.5* 8.7* 8.6* 8.7* 8.7*  MG 1.9  --  1.8  --   --   --   --    < > = values in this interval not displayed.   Liver Function Tests: Recent Labs  Lab 08/11/22 0020  AST 52*  ALT 77*  ALKPHOS 38  BILITOT 0.7  PROT 6.1*  ALBUMIN 3.5   No results for input(s): "LIPASE", "AMYLASE" in the last 168 hours. No results for input(s): "AMMONIA" in the last 168 hours. CBC: Recent Labs  Lab 08/11/22 0020 08/11/22 0031 08/11/22 0617 08/12/22 0102 08/13/22 0217  WBC 10.9*  --  8.9 13.0* 10.0  NEUTROABS 8.3*  --  7.8*  --   --   HGB 9.2* 10.9*  11.2* 8.5* 8.5* 9.0*  HCT 30.3* 32.0*  33.0* 27.7* 27.1* 28.9*  MCV 96.8  --  94.2 95.1 95.1  PLT 260  --  217 252 269   Cardiac Enzymes: No results for input(s): "CKTOTAL", "CKMB", "CKMBINDEX", "TROPONINI" in the last 168 hours. BNP: BNP (last 3 results) Recent Labs    08/04/22 0312 08/05/22 0545 08/11/22 0020  BNP 59.8 595.5* 851.4*    ProBNP (last 3 results) No results for input(s): "PROBNP" in the last 8760 hours.  CBG: No results for input(s): "GLUCAP" in the last 168 hours.     Signed:  Zannie Cove MD.  [facility] 08/15/2022, 11:03 AM

## 2022-08-15 NOTE — TOC Transition Note (Addendum)
Transition of Care [facility]) - CM/SW Discharge Note   Patient Details  Name: Gerald Valdez MRN: [id number] Date of Birth: [birth year]  Transition of Care [facility]) CM/SW Contact:  Ronny Bacon, RN Phone Number: 08/15/2022, 9:01 AM   Clinical Narrative:  Patient being discharged home today. Daughter will transport patient home. [employer] confirms hospital bed will be delivered Monday or Tuesday. Daughter reports able to use reclining chair until hospital bed arrives.  10:28- [facility] confirmed resumption of HH services for patient is scheduled.    Final next level of care: Home w Home Health Services Barriers to Discharge: No Barriers Identified   Patient Goals and CMS Choice   Choice offered to / list presented to : NA  Discharge Placement                         Discharge Plan and Services Additional resources added to the After Visit Summary for   In-house Referral: NA Discharge Planning Services: CM Consult Post Acute Care Choice: Home Health, Resumption of Svcs/PTA Provider          DME Arranged: Hospital bed DME Agency: [employer] Date DME Agency Contacted: 08/14/22 Time DME Agency Contacted: 1027 Representative spoke with at DME Agency: Vaughan Basta HH Arranged: RN, Disease Management, PT HH Agency: [facility] Date [facility] Agency Contacted: 08/11/22 Time HH Agency Contacted: 1000 Representative spoke with at [facility] Agency: Elnita Maxwell  Social Determinants of Health (SDOH) Interventions SDOH Screenings   Food Insecurity: No Food Insecurity (08/11/2022)  Housing: Low Risk  (08/11/2022)  Transportation Needs: No Transportation Needs (08/11/2022)  Utilities: Not At Risk (08/11/2022)  Tobacco Use: Medium Risk (08/11/2022)     Readmission Risk Interventions    08/11/2022   12:01 PM 07/27/2022    4:19 PM  Readmission Risk Prevention Plan  Transportation Screening Complete Complete  PCP or Specialist Appt within 3-5 Days Complete Complete   HRI or Home Care Consult Complete   Social Work Consult for Recovery Care Planning/Counseling Complete Complete  Palliative Care Screening Complete Complete  Medication Review Oceanographer) Referral to Pharmacy Complete

## 2022-08-15 NOTE — TOC Transition Note (Signed)
Transition of Care [facility]) - CM/SW Discharge Note   Patient Details  Name: Gerald Valdez MRN: [id number] Date of Birth: Aug 20, 1928  Transition of Care [facility]) CM/SW Contact:  Ronny Bacon, RN Phone Number: 08/15/2022, 11:37 AM   Clinical Narrative:  Floor nurse reports patient needing nebulizer machine for home. [employer] able to provide nebulizer for patient to be delivered to bedside before discharge home.     Final next level of care: Home w Home Health Services Barriers to Discharge: No Barriers Identified   Patient Goals and CMS Choice   Choice offered to / list presented to : NA  Discharge Placement                         Discharge Plan and Services Additional resources added to the After Visit Summary for   In-house Referral: NA Discharge Planning Services: CM Consult Post Acute Care Choice: Home Health, Resumption of Svcs/PTA Provider          DME Arranged: Nebulizer machine DME Agency: [employer] Date DME Agency Contacted: 08/15/22 Time DME Agency Contacted: 1136 Representative spoke with at DME Agency: Vaughan Basta HH Arranged: RN, Disease Management, PT HH Agency: [facility] Date [facility] Agency Contacted: 08/11/22 Time HH Agency Contacted: 1000 Representative spoke with at [facility] Agency: Elnita Maxwell  Social Determinants of Health (SDOH) Interventions SDOH Screenings   Food Insecurity: No Food Insecurity (08/11/2022)  Housing: Low Risk  (08/11/2022)  Transportation Needs: No Transportation Needs (08/11/2022)  Utilities: Not At Risk (08/11/2022)  Tobacco Use: Medium Risk (08/11/2022)     Readmission Risk Interventions    08/11/2022   12:01 PM 07/27/2022    4:19 PM  Readmission Risk Prevention Plan  Transportation Screening Complete Complete  PCP or Specialist Appt within 3-5 Days Complete Complete  HRI or Home Care Consult Complete   Social Work Consult for Recovery Care Planning/Counseling Complete Complete  Palliative Care  Screening Complete Complete  Medication Review Oceanographer) Referral to Pharmacy Complete

## 2022-08-16 ENCOUNTER — Telehealth: Payer: Self-pay

## 2022-08-16 DIAGNOSIS — J449 Chronic obstructive pulmonary disease, unspecified: Secondary | ICD-10-CM | POA: Diagnosis not present

## 2022-08-16 DIAGNOSIS — I130 Hypertensive heart and chronic kidney disease with heart failure and stage 1 through stage 4 chronic kidney disease, or unspecified chronic kidney disease: Secondary | ICD-10-CM | POA: Diagnosis not present

## 2022-08-16 DIAGNOSIS — E871 Hypo-osmolality and hyponatremia: Secondary | ICD-10-CM | POA: Diagnosis not present

## 2022-08-16 DIAGNOSIS — N1831 Chronic kidney disease, stage 3a: Secondary | ICD-10-CM | POA: Diagnosis not present

## 2022-08-16 DIAGNOSIS — F0390 Unspecified dementia without behavioral disturbance: Secondary | ICD-10-CM | POA: Diagnosis not present

## 2022-08-16 DIAGNOSIS — I5023 Acute on chronic systolic (congestive) heart failure: Secondary | ICD-10-CM | POA: Diagnosis not present

## 2022-08-16 DIAGNOSIS — I480 Paroxysmal atrial fibrillation: Secondary | ICD-10-CM | POA: Diagnosis not present

## 2022-08-16 DIAGNOSIS — J9621 Acute and chronic respiratory failure with hypoxia: Secondary | ICD-10-CM | POA: Diagnosis not present

## 2022-08-16 DIAGNOSIS — J9612 Chronic respiratory failure with hypercapnia: Secondary | ICD-10-CM | POA: Diagnosis not present

## 2022-08-16 DIAGNOSIS — Z466 Encounter for fitting and adjustment of urinary device: Secondary | ICD-10-CM | POA: Diagnosis not present

## 2022-08-16 DIAGNOSIS — Z9981 Dependence on supplemental oxygen: Secondary | ICD-10-CM | POA: Diagnosis not present

## 2022-08-16 DIAGNOSIS — C679 Malignant neoplasm of bladder, unspecified: Secondary | ICD-10-CM | POA: Diagnosis not present

## 2022-08-16 LAB — CULTURE, BLOOD (ROUTINE X 2)
Culture: NO GROWTH
Special Requests: ADEQUATE

## 2022-08-16 NOTE — Telephone Encounter (Signed)
The patient's daughter contacted [place] to inform that her father has been discharged from the hospital and is currently experiencing weakness. She requested to reschedule his appointment for approximately 4 weeks from now. A scheduler has been assigned to arrange for the patient to be seen within the next 4 weeks.

## 2022-08-17 ENCOUNTER — Inpatient Hospital Stay: Payer: Medicare Other | Admitting: Oncology

## 2022-08-17 ENCOUNTER — Other Ambulatory Visit: Payer: Self-pay | Admitting: Nurse Practitioner

## 2022-08-17 MED ORDER — ALBUTEROL SULFATE (2.5 MG/3ML) 0.083% IN NEBU: 2.5000 mg | INHALATION_SOLUTION | Freq: Four times a day (QID) | RESPIRATORY_TRACT | 0 refills | Status: DC | PRN

## 2022-08-17 NOTE — Progress Notes (Signed)
ICD codes and dx sent to OP pharmacy

## 2022-08-17 NOTE — Progress Notes (Signed)
New albuterol script with dx codes sent to [facility] in [address]

## 2022-08-18 ENCOUNTER — Ambulatory Visit: Payer: Self-pay

## 2022-08-18 ENCOUNTER — Ambulatory Visit: Payer: Medicare Other

## 2022-08-19 NOTE — Patient Outreach (Signed)
  Care Coordination   Initial Visit Note   08/18/2022 Name: Gerald Valdez MRN: [id number] DOB: [birth year]  Gerald Valdez is a 89 y.o. year old male who sees Avva, Ravisankar, MD for primary care. I spoke with daughter Gerald Valdez (verbal consent provided by wife Gerald Valdez), by phone today.  What matters to the patients health and wellness today?  Patient would like to avoid going back into the hospital.     Goals Addressed             This Visit's Progress    To better manage CHF       Care Coordination Interventions: Basic overview and discussion of pathophysiology of Heart Failure reviewed Provided education on low sodium diet Reviewed Heart Failure Action Plan in depth and provided written copy Assessed need for readable accurate scales in home Provided education about placing scale on hard, flat surface Advised patient to weigh each morning after emptying bladder Discussed importance of daily weight and advised patient to weigh and record daily Reviewed role of diuretics in prevention of fluid overload and management of heart failure; Discussed the importance of keeping all appointments with provider     To better manage COPD       Care Coordination Interventions: Completed successful outbound call with daughter Glendon Axe (verbal consent received from wife Corrie Dandy) Provided patient with basic written and verbal COPD education on self care/management/and exacerbation prevention Assessed for daughter's understanding about proper use of medications used for management of COPD including inhalers/nebulizer Mailed printed COPD action plan zone and make appointment with provider if in the yellow zone for 48 hours without improvement Discussed the importance of adequate rest and management of fatigue with COPD Reviewed upcoming scheduled Pulmonary visit with Dr. Everardo All for a post hospital follow up scheduled for 08/25/22 @11 :15 AM    Interventions Today    Flowsheet Row Most  Recent Value  Chronic Disease   Chronic disease during today's visit Congestive Heart Failure (CHF), Chronic Obstructive Pulmonary Disease (COPD)  General Interventions   General Interventions Discussed/Reviewed General Interventions Discussed, General Interventions Reviewed, Doctor Visits, Labs, Durable Medical Equipment (DME)  Doctor Visits Discussed/Reviewed Doctor Visits Discussed, Doctor Visits Reviewed  Durable Medical Equipment (DME) Other  [hospital bed,  nebulizer]  Exercise Interventions   Exercise Discussed/Reviewed Physical Activity  Physical Activity Discussed/Reviewed Physical Activity Discussed, Physical Activity Reviewed, Home Exercise Program (HEP)  Education Interventions   Education Provided Provided printed education; Provided Education  Provided Verbal Education On Labs, When to see the doctor, Medication, Nutrition  Nutrition Interventions   Nutrition Discussed/Reviewed Nutrition Discussed, Nutrition Reviewed, Fluid intake, Decreasing salt  Pharmacy Interventions   Pharmacy Dicussed/Reviewed Pharmacy Topics Discussed, Pharmacy Topics Reviewed, Medications and their functions  Safety Interventions   Safety Discussed/Reviewed Safety Discussed, Safety Reviewed, Home Safety, Fall Risk  Home Safety Assistive Devices, Contact provider for referral to PT/OT        SDOH assessments and interventions completed:  No     Care Coordination Interventions:  Yes, provided   Follow up plan: Follow up call scheduled for 09/20/22 @1 :00 PM    Encounter Outcome:  Pt. Visit Completed

## 2022-08-19 NOTE — Patient Instructions (Signed)
Visit Information  Thank you for taking time to visit with me today. Please don't hesitate to contact me if I can be of assistance to you.   Following are the goals we discussed today:   Goals Addressed             This Visit's Progress    To better manage CHF       Care Coordination Interventions: Basic overview and discussion of pathophysiology of Heart Failure reviewed Provided education on low sodium diet Reviewed Heart Failure Action Plan in depth and provided written copy Assessed need for readable accurate scales in home Provided education about placing scale on hard, flat surface Advised patient to weigh each morning after emptying bladder Discussed importance of daily weight and advised patient to weigh and record daily Reviewed role of diuretics in prevention of fluid overload and management of heart failure; Discussed the importance of keeping all appointments with provider        To better manage COPD       Care Coordination Interventions: Completed successful outbound call with daughter Glendon Axe (verbal consent received from wife Corrie Dandy) Provided patient with basic written and verbal COPD education on self care/management/and exacerbation prevention Assessed for daughter's understanding about proper use of medications used for management of COPD including inhalers/nebulizer Mailed printed COPD action plan zone and make appointment with provider if in the yellow zone for 48 hours without improvement Discussed the importance of adequate rest and management of fatigue with COPD Reviewed upcoming scheduled Pulmonary visit with Dr. Everardo All for a post hospital follow up scheduled for 08/25/22 @11 :15 AM           Our next appointment is by telephone on 09/20/22 at 1:00 PM  Please call the care guide team at [phone] if you need to cancel or reschedule your appointment.   If you are experiencing a Mental Health or Behavioral Health Crisis or need someone to talk to,  please call 1-800-273-TALK (toll free, 24 hour hotline)  Patient verbalizes understanding of instructions and care plan provided today and agrees to view in MyChart. Active MyChart status and patient understanding of how to access instructions and care plan via MyChart confirmed with patient.     Delsa Sale, RN, BSN, CCM Care Management Coordinator [facility] Care Management  Direct Phone: [phone]

## 2022-08-20 DIAGNOSIS — N1831 Chronic kidney disease, stage 3a: Secondary | ICD-10-CM | POA: Diagnosis not present

## 2022-08-20 DIAGNOSIS — J449 Chronic obstructive pulmonary disease, unspecified: Secondary | ICD-10-CM | POA: Diagnosis not present

## 2022-08-20 DIAGNOSIS — J9612 Chronic respiratory failure with hypercapnia: Secondary | ICD-10-CM | POA: Diagnosis not present

## 2022-08-20 DIAGNOSIS — I130 Hypertensive heart and chronic kidney disease with heart failure and stage 1 through stage 4 chronic kidney disease, or unspecified chronic kidney disease: Secondary | ICD-10-CM | POA: Diagnosis not present

## 2022-08-20 DIAGNOSIS — I5023 Acute on chronic systolic (congestive) heart failure: Secondary | ICD-10-CM | POA: Diagnosis not present

## 2022-08-20 DIAGNOSIS — J9621 Acute and chronic respiratory failure with hypoxia: Secondary | ICD-10-CM | POA: Diagnosis not present

## 2022-08-23 DIAGNOSIS — N1831 Chronic kidney disease, stage 3a: Secondary | ICD-10-CM | POA: Diagnosis not present

## 2022-08-23 DIAGNOSIS — I130 Hypertensive heart and chronic kidney disease with heart failure and stage 1 through stage 4 chronic kidney disease, or unspecified chronic kidney disease: Secondary | ICD-10-CM | POA: Diagnosis not present

## 2022-08-23 DIAGNOSIS — J449 Chronic obstructive pulmonary disease, unspecified: Secondary | ICD-10-CM | POA: Diagnosis not present

## 2022-08-23 DIAGNOSIS — J9621 Acute and chronic respiratory failure with hypoxia: Secondary | ICD-10-CM | POA: Diagnosis not present

## 2022-08-23 DIAGNOSIS — J9612 Chronic respiratory failure with hypercapnia: Secondary | ICD-10-CM | POA: Diagnosis not present

## 2022-08-23 DIAGNOSIS — I5023 Acute on chronic systolic (congestive) heart failure: Secondary | ICD-10-CM | POA: Diagnosis not present

## 2022-08-24 DIAGNOSIS — J9612 Chronic respiratory failure with hypercapnia: Secondary | ICD-10-CM | POA: Diagnosis not present

## 2022-08-24 DIAGNOSIS — E785 Hyperlipidemia, unspecified: Secondary | ICD-10-CM | POA: Diagnosis not present

## 2022-08-24 DIAGNOSIS — D649 Anemia, unspecified: Secondary | ICD-10-CM | POA: Diagnosis not present

## 2022-08-24 DIAGNOSIS — N179 Acute kidney failure, unspecified: Secondary | ICD-10-CM | POA: Diagnosis not present

## 2022-08-24 DIAGNOSIS — I700 Atherosclerosis of aorta: Secondary | ICD-10-CM | POA: Diagnosis not present

## 2022-08-24 DIAGNOSIS — I129 Hypertensive chronic kidney disease with stage 1 through stage 4 chronic kidney disease, or unspecified chronic kidney disease: Secondary | ICD-10-CM | POA: Diagnosis not present

## 2022-08-24 DIAGNOSIS — I480 Paroxysmal atrial fibrillation: Secondary | ICD-10-CM | POA: Diagnosis not present

## 2022-08-24 DIAGNOSIS — I2510 Atherosclerotic heart disease of native coronary artery without angina pectoris: Secondary | ICD-10-CM | POA: Diagnosis not present

## 2022-08-24 DIAGNOSIS — J449 Chronic obstructive pulmonary disease, unspecified: Secondary | ICD-10-CM | POA: Diagnosis not present

## 2022-08-24 DIAGNOSIS — J9601 Acute respiratory failure with hypoxia: Secondary | ICD-10-CM | POA: Diagnosis not present

## 2022-08-24 DIAGNOSIS — C679 Malignant neoplasm of bladder, unspecified: Secondary | ICD-10-CM | POA: Diagnosis not present

## 2022-08-24 DIAGNOSIS — N1832 Chronic kidney disease, stage 3b: Secondary | ICD-10-CM | POA: Diagnosis not present

## 2022-08-25 ENCOUNTER — Ambulatory Visit (INDEPENDENT_AMBULATORY_CARE_PROVIDER_SITE_OTHER): Payer: Medicare Other | Admitting: Pulmonary Disease

## 2022-08-25 ENCOUNTER — Encounter (HOSPITAL_BASED_OUTPATIENT_CLINIC_OR_DEPARTMENT_OTHER): Payer: Self-pay | Admitting: Pulmonary Disease

## 2022-08-25 VITALS — BP 110/60 | HR 54 | Temp 97.9°F | Ht 66.0 in | Wt 117.3 lb

## 2022-08-25 DIAGNOSIS — I5023 Acute on chronic systolic (congestive) heart failure: Secondary | ICD-10-CM | POA: Diagnosis not present

## 2022-08-25 DIAGNOSIS — J449 Chronic obstructive pulmonary disease, unspecified: Secondary | ICD-10-CM | POA: Diagnosis not present

## 2022-08-25 DIAGNOSIS — J432 Centrilobular emphysema: Secondary | ICD-10-CM

## 2022-08-25 DIAGNOSIS — J9621 Acute and chronic respiratory failure with hypoxia: Secondary | ICD-10-CM | POA: Diagnosis not present

## 2022-08-25 DIAGNOSIS — I130 Hypertensive heart and chronic kidney disease with heart failure and stage 1 through stage 4 chronic kidney disease, or unspecified chronic kidney disease: Secondary | ICD-10-CM | POA: Diagnosis not present

## 2022-08-25 DIAGNOSIS — J9611 Chronic respiratory failure with hypoxia: Secondary | ICD-10-CM | POA: Diagnosis not present

## 2022-08-25 DIAGNOSIS — N1831 Chronic kidney disease, stage 3a: Secondary | ICD-10-CM | POA: Diagnosis not present

## 2022-08-25 DIAGNOSIS — J9612 Chronic respiratory failure with hypercapnia: Secondary | ICD-10-CM | POA: Diagnosis not present

## 2022-08-25 MED ORDER — UMECLIDINIUM-VILANTEROL 62.5-25 MCG/ACT IN AEPB: 1.0000 | INHALATION_SPRAY | Freq: Every day | RESPIRATORY_TRACT | 5 refills | Status: DC

## 2022-08-25 NOTE — Patient Instructions (Signed)
Emphysema Chronic hypoxemic respiratory failure --CONTINUE Anoro ONE puff ONCE a day --CONTINUE albuterol nebulizer AS NEEDED for shortness of breath or wheezing --CONTINUE 3L oxygen with activity and sleep --Unable to tolerate BiPAP. Discussed higher risk for respiratory failure --Agree with Palliative Care. Family considering hospice when they are ready

## 2022-08-25 NOTE — Progress Notes (Signed)
Subjective:   PATIENT ID: Gerald Valdez GENDER: male DOB: 17-Jan-1929, MRN: [id number]  Chief Complaint  Patient presents with   Hospitalization Follow-up    Hospital f/u, daughter states breathing is fair, oxygen is staying up in stats, pt is having trouble with eating and drinking.    Reason for Visit: New consult for hospital follow-up  Gerald Valdez is a 89 year old male former smoker with dementia, emphysema, chronic hypoxemic and hypercarbic respiratory failure, bladder cancer s/p chronic foley, atrial fibrillation, chronic systolic heart failure, HTN, CKD IIIa who presents for hospital follow-up. Daughter and wife provides majority of history.  He was seen by Pulmonary as a consult on 07/26/22 for chronic hypercarbic respiratory failure. He was hospitalized 6/10-6/18 for pneumonia and then readmitted from 6/24-6/28 for seizure like episode in setting of cystitis and then readmitted for heart failure and COPD exacerbation. Previously recommend BiPAP for chronic hypercarbic respiratory failure but discontinued due to inability to tolerate BiPAP due to claustrophobia.Has been compliant with Anoro. Has nebulizer at home but not needed. On 3L oxygen continuously with nadir SpO2 89% with activity. Has chronic shortness of breath. Occasional cough. Does well at rest.  Social History: Quit smoking  >30 years ago  I have personally reviewed patient's past medical/family/social history, allergies, current medications.  Past Medical History:  Diagnosis Date   Acute duodenal ulcer with bleeding 01/30/2014   Acute post-hemorrhagic anemia 01/30/2014   Anxiety    Aortic atherosclerosis (HCC) 02/10/2020   Arthritis    Basal cell carcinoma    Cancer (HCC)    bladder   Chronic kidney disease    COPD exacerbation (HCC) 02/10/2020   Coronary artery disease    Dyspnea    Dyspnea on exertion 02/10/2020   Emphysema of lung (HCC)    Essential hypertension 02/10/2020   GERD  (gastroesophageal reflux disease) 02/10/2020   GI bleed 01/30/2014   Hyperlipidemia 02/10/2020     Family History  Problem Relation Age of Onset   Hypertension Mother    Stroke Father      Social History   Occupational History   Occupation: retired  Tobacco Use   Smoking status: Former    Current packs/day: 0.00    Types: Cigarettes    Start date: 1947    Quit date: 1994    Years since quitting: 30.5   Smokeless tobacco: Never  Vaping Use   Vaping status: Never Used  Substance and Sexual Activity   Alcohol use: Not Currently   Drug use: Never   Sexual activity: Not Currently    No Known Allergies   Outpatient Medications Prior to Visit  Medication Sig Dispense Refill   albuterol (PROVENTIL) (2.5 MG/3ML) 0.083% nebulizer solution Take 3 mLs (2.5 mg total) by nebulization every 6 (six) hours as needed for wheezing or shortness of breath. Chronic hypercapneic respiratory failure and COPD J96.12, J44.9 75 mL 0   ascorbic acid (VITAMIN C) 500 MG tablet Take 500 mg by mouth daily.     atorvastatin (LIPITOR) 40 MG tablet Take 1 tablet (40 mg total) by mouth daily.     escitalopram (LEXAPRO) 10 MG tablet Take 10 mg by mouth daily.     feeding supplement (ENSURE ENLIVE / ENSURE PLUS) LIQD Take 237 mLs by mouth 2 (two) times daily between meals. 237 mL 12   furosemide (LASIX) 40 MG tablet Take 1 tablet (40 mg total) by mouth daily. 30 tablet 1   loratadine (CLARITIN) 10 MG tablet Take  10 mg by mouth daily.     metoprolol succinate (TOPROL-XL) 25 MG 24 hr tablet Take 2 tablets (50 mg total) by mouth daily. 60 tablet 1   Multiple Vitamin (MULTIVITAMIN WITH MINERALS) TABS tablet Take 1 tablet by mouth daily with breakfast.     OXYGEN Inhale 3 L into the lungs continuous.     pantoprazole (PROTONIX) 40 MG tablet Take 1 tablet (40 mg total) by mouth 2 (two) times daily before a meal. 60 tablet 1   polyethylene glycol (MIRALAX / GLYCOLAX) 17 g packet Take 17 g by mouth daily as needed  for mild constipation. 14 each 0   potassium chloride SA (KLOR-CON M) 20 MEQ tablet Take 1 tablet (20 mEq total) by mouth daily. 30 tablet 1   saccharomyces boulardii (FLORASTOR) 250 MG capsule Take 1 capsule (250 mg total) by mouth 2 (two) times daily. 20 capsule 0   tamsulosin (FLOMAX) 0.4 MG CAPS capsule Take 1 capsule (0.4 mg total) by mouth daily. 30 capsule 1   umeclidinium-vilanterol (ANORO ELLIPTA) 62.5-25 MCG/ACT AEPB Inhale 1 puff into the lungs daily.     Facility-Administered Medications Prior to Visit  Medication Dose Route Frequency Provider Last Rate Last Admin   gemcitabine (GEMZAR) chemo syringe for bladder instillation 2,000 mg  2,000 mg Bladder Instillation Once Marcine Matar, MD        Review of Systems  Constitutional:  Negative for chills, diaphoresis, fever, malaise/fatigue and weight loss.  HENT:  Negative for congestion.   Respiratory:  Positive for shortness of breath. Negative for cough, hemoptysis, sputum production and wheezing.   Cardiovascular:  Negative for chest pain, palpitations and leg swelling.     Objective:   Vitals:   08/25/22 1101  BP: 110/60  Pulse: (!) 54  Temp: 97.9 F (36.6 C)  TempSrc: Oral  SpO2: 96%  Weight: 117 lb 4.8 oz (53.2 kg)  Height: 5\' 6"  (1.676 m)   SpO2: 96 %  Physical Exam: General: Chronically ill-appearing, distracted but follows commands HENT: [place], AT Eyes: EOMI, no scleral icterus Respiratory: Diminished air entry to auscultation bilaterally.  No crackles, wheezing or rales Cardiovascular: RRR, -M/R/G, no JVD Extremities:-Edema,-tenderness Neuro: AAO x4, CNII-XII grossly intact, follows commands Psych: Normal mood, normal affect  Data Reviewed:  Imaging: CXR 08/11/22 No acute infiltrate effusion or edema  PFT: None on file  Labs: CBC    Component Value Date/Time   WBC 10.0 08/13/2022 0217   RBC 3.04 (L) 08/13/2022 0217   HGB 9.0 (L) 08/13/2022 0217   HCT 28.9 (L) 08/13/2022 0217   PLT 269  08/13/2022 0217   MCV 95.1 08/13/2022 0217   MCH 29.6 08/13/2022 0217   MCHC 31.1 08/13/2022 0217   RDW 13.2 08/13/2022 0217   LYMPHSABS 0.5 (L) 08/11/2022 0617   MONOABS 0.4 08/11/2022 0617   EOSABS 0.0 08/11/2022 0617   BASOSABS 0.0 08/11/2022 0617      Assessment & Plan:   Discussion: 89 year old male former smoker with dementia, emphysema, chronic hypoxemic and hypercarbic respiratory failure, bladder cancer s/p chronic foley, atrial fibrillation, chronic systolic heart failure, HTN, CKD IIIa who presents for hospital follow-up. Reviewed hospital courses. With multiple co-morbidities and recent multiple hospitalizations, we discussed risk of readmission. His respiratory symptoms are stable at this time but any acute/major insult whether respiratory, cardiac or other infection will impact his quality of life. Patient is enrolled in Palliative but not ready for hospice at this time. Currently DNR.   Emphysema Chronic hypoxemic  respiratory failure --CONTINUE Anoro ONE puff ONCE a day --CONTINUE albuterol nebulizer AS NEEDED for shortness of breath or wheezing --CONTINUE 3L oxygen with activity and sleep --Unable to tolerate BiPAP. Discussed higher risk for respiratory failure --Agree with Palliative Care. Family considering hospice when they are ready  Health Maintenance  There is no immunization history on file for this patient. CT Lung Screen - not qualified  No orders of the defined types were placed in this encounter.  Meds ordered this encounter  Medications   umeclidinium-vilanterol (ANORO ELLIPTA) 62.5-25 MCG/ACT AEPB    Sig: Inhale 1 puff into the lungs daily.    Dispense:  60 each    Refill:  5    Return for September.  I have spent a total time of 45-minutes on the day of the appointment reviewing prior documentation, coordinating care and discussing medical diagnosis and plan with the patient/family. Imaging, labs and tests included in this note have been reviewed  and interpreted independently by me.  Siera Beyersdorf Mechele Collin, MD [facility] 08/25/2022 12:17 PM  Office Number [phone]

## 2022-08-26 DIAGNOSIS — J9612 Chronic respiratory failure with hypercapnia: Secondary | ICD-10-CM | POA: Diagnosis not present

## 2022-08-26 DIAGNOSIS — J449 Chronic obstructive pulmonary disease, unspecified: Secondary | ICD-10-CM | POA: Diagnosis not present

## 2022-08-26 DIAGNOSIS — N1831 Chronic kidney disease, stage 3a: Secondary | ICD-10-CM | POA: Diagnosis not present

## 2022-08-26 DIAGNOSIS — I5023 Acute on chronic systolic (congestive) heart failure: Secondary | ICD-10-CM | POA: Diagnosis not present

## 2022-08-26 DIAGNOSIS — J9621 Acute and chronic respiratory failure with hypoxia: Secondary | ICD-10-CM | POA: Diagnosis not present

## 2022-08-26 DIAGNOSIS — I130 Hypertensive heart and chronic kidney disease with heart failure and stage 1 through stage 4 chronic kidney disease, or unspecified chronic kidney disease: Secondary | ICD-10-CM | POA: Diagnosis not present

## 2022-08-27 DIAGNOSIS — I5023 Acute on chronic systolic (congestive) heart failure: Secondary | ICD-10-CM | POA: Diagnosis not present

## 2022-08-27 DIAGNOSIS — I130 Hypertensive heart and chronic kidney disease with heart failure and stage 1 through stage 4 chronic kidney disease, or unspecified chronic kidney disease: Secondary | ICD-10-CM | POA: Diagnosis not present

## 2022-08-27 DIAGNOSIS — J9621 Acute and chronic respiratory failure with hypoxia: Secondary | ICD-10-CM | POA: Diagnosis not present

## 2022-08-27 DIAGNOSIS — N1831 Chronic kidney disease, stage 3a: Secondary | ICD-10-CM | POA: Diagnosis not present

## 2022-08-27 DIAGNOSIS — J449 Chronic obstructive pulmonary disease, unspecified: Secondary | ICD-10-CM | POA: Diagnosis not present

## 2022-08-27 DIAGNOSIS — J9612 Chronic respiratory failure with hypercapnia: Secondary | ICD-10-CM | POA: Diagnosis not present

## 2022-08-30 DIAGNOSIS — I130 Hypertensive heart and chronic kidney disease with heart failure and stage 1 through stage 4 chronic kidney disease, or unspecified chronic kidney disease: Secondary | ICD-10-CM | POA: Diagnosis not present

## 2022-08-30 DIAGNOSIS — J9621 Acute and chronic respiratory failure with hypoxia: Secondary | ICD-10-CM | POA: Diagnosis not present

## 2022-08-30 DIAGNOSIS — J9612 Chronic respiratory failure with hypercapnia: Secondary | ICD-10-CM | POA: Diagnosis not present

## 2022-08-30 DIAGNOSIS — I5023 Acute on chronic systolic (congestive) heart failure: Secondary | ICD-10-CM | POA: Diagnosis not present

## 2022-08-30 DIAGNOSIS — N1831 Chronic kidney disease, stage 3a: Secondary | ICD-10-CM | POA: Diagnosis not present

## 2022-08-30 DIAGNOSIS — J449 Chronic obstructive pulmonary disease, unspecified: Secondary | ICD-10-CM | POA: Diagnosis not present

## 2022-08-31 DIAGNOSIS — I5023 Acute on chronic systolic (congestive) heart failure: Secondary | ICD-10-CM | POA: Diagnosis not present

## 2022-08-31 DIAGNOSIS — J9612 Chronic respiratory failure with hypercapnia: Secondary | ICD-10-CM | POA: Diagnosis not present

## 2022-08-31 DIAGNOSIS — I130 Hypertensive heart and chronic kidney disease with heart failure and stage 1 through stage 4 chronic kidney disease, or unspecified chronic kidney disease: Secondary | ICD-10-CM | POA: Diagnosis not present

## 2022-08-31 DIAGNOSIS — N1831 Chronic kidney disease, stage 3a: Secondary | ICD-10-CM | POA: Diagnosis not present

## 2022-08-31 DIAGNOSIS — J9621 Acute and chronic respiratory failure with hypoxia: Secondary | ICD-10-CM | POA: Diagnosis not present

## 2022-08-31 DIAGNOSIS — J449 Chronic obstructive pulmonary disease, unspecified: Secondary | ICD-10-CM | POA: Diagnosis not present

## 2022-09-02 DIAGNOSIS — J449 Chronic obstructive pulmonary disease, unspecified: Secondary | ICD-10-CM | POA: Diagnosis not present

## 2022-09-02 DIAGNOSIS — N1831 Chronic kidney disease, stage 3a: Secondary | ICD-10-CM | POA: Diagnosis not present

## 2022-09-02 DIAGNOSIS — J9621 Acute and chronic respiratory failure with hypoxia: Secondary | ICD-10-CM | POA: Diagnosis not present

## 2022-09-02 DIAGNOSIS — I5023 Acute on chronic systolic (congestive) heart failure: Secondary | ICD-10-CM | POA: Diagnosis not present

## 2022-09-02 DIAGNOSIS — J9612 Chronic respiratory failure with hypercapnia: Secondary | ICD-10-CM | POA: Diagnosis not present

## 2022-09-02 DIAGNOSIS — I130 Hypertensive heart and chronic kidney disease with heart failure and stage 1 through stage 4 chronic kidney disease, or unspecified chronic kidney disease: Secondary | ICD-10-CM | POA: Diagnosis not present

## 2022-09-06 DIAGNOSIS — I130 Hypertensive heart and chronic kidney disease with heart failure and stage 1 through stage 4 chronic kidney disease, or unspecified chronic kidney disease: Secondary | ICD-10-CM | POA: Diagnosis not present

## 2022-09-06 DIAGNOSIS — J9621 Acute and chronic respiratory failure with hypoxia: Secondary | ICD-10-CM | POA: Diagnosis not present

## 2022-09-06 DIAGNOSIS — N1831 Chronic kidney disease, stage 3a: Secondary | ICD-10-CM | POA: Diagnosis not present

## 2022-09-06 DIAGNOSIS — J449 Chronic obstructive pulmonary disease, unspecified: Secondary | ICD-10-CM | POA: Diagnosis not present

## 2022-09-06 DIAGNOSIS — I5023 Acute on chronic systolic (congestive) heart failure: Secondary | ICD-10-CM | POA: Diagnosis not present

## 2022-09-06 DIAGNOSIS — C679 Malignant neoplasm of bladder, unspecified: Secondary | ICD-10-CM | POA: Diagnosis not present

## 2022-09-06 DIAGNOSIS — J9612 Chronic respiratory failure with hypercapnia: Secondary | ICD-10-CM | POA: Diagnosis not present

## 2022-09-08 DIAGNOSIS — I130 Hypertensive heart and chronic kidney disease with heart failure and stage 1 through stage 4 chronic kidney disease, or unspecified chronic kidney disease: Secondary | ICD-10-CM | POA: Diagnosis not present

## 2022-09-08 DIAGNOSIS — J9621 Acute and chronic respiratory failure with hypoxia: Secondary | ICD-10-CM | POA: Diagnosis not present

## 2022-09-08 DIAGNOSIS — J449 Chronic obstructive pulmonary disease, unspecified: Secondary | ICD-10-CM | POA: Diagnosis not present

## 2022-09-08 DIAGNOSIS — J9612 Chronic respiratory failure with hypercapnia: Secondary | ICD-10-CM | POA: Diagnosis not present

## 2022-09-08 DIAGNOSIS — N1831 Chronic kidney disease, stage 3a: Secondary | ICD-10-CM | POA: Diagnosis not present

## 2022-09-08 DIAGNOSIS — I5023 Acute on chronic systolic (congestive) heart failure: Secondary | ICD-10-CM | POA: Diagnosis not present

## 2022-09-09 DIAGNOSIS — N1831 Chronic kidney disease, stage 3a: Secondary | ICD-10-CM | POA: Diagnosis not present

## 2022-09-09 DIAGNOSIS — I130 Hypertensive heart and chronic kidney disease with heart failure and stage 1 through stage 4 chronic kidney disease, or unspecified chronic kidney disease: Secondary | ICD-10-CM | POA: Diagnosis not present

## 2022-09-09 DIAGNOSIS — J449 Chronic obstructive pulmonary disease, unspecified: Secondary | ICD-10-CM | POA: Diagnosis not present

## 2022-09-09 DIAGNOSIS — J9621 Acute and chronic respiratory failure with hypoxia: Secondary | ICD-10-CM | POA: Diagnosis not present

## 2022-09-09 DIAGNOSIS — I5023 Acute on chronic systolic (congestive) heart failure: Secondary | ICD-10-CM | POA: Diagnosis not present

## 2022-09-09 DIAGNOSIS — J9612 Chronic respiratory failure with hypercapnia: Secondary | ICD-10-CM | POA: Diagnosis not present

## 2022-09-10 ENCOUNTER — Encounter (HOSPITAL_BASED_OUTPATIENT_CLINIC_OR_DEPARTMENT_OTHER): Payer: Self-pay

## 2022-09-10 ENCOUNTER — Emergency Department (HOSPITAL_BASED_OUTPATIENT_CLINIC_OR_DEPARTMENT_OTHER): Payer: Medicare Other | Admitting: Radiology

## 2022-09-10 ENCOUNTER — Inpatient Hospital Stay (HOSPITAL_BASED_OUTPATIENT_CLINIC_OR_DEPARTMENT_OTHER)
Admission: EM | Admit: 2022-09-10 | Discharge: 2022-09-13 | DRG: 698 | Disposition: A | Discharge status: Home Health | Payer: Medicare Other | Attending: Internal Medicine | Admitting: Internal Medicine

## 2022-09-10 ENCOUNTER — Other Ambulatory Visit: Payer: Self-pay

## 2022-09-10 ENCOUNTER — Emergency Department (HOSPITAL_BASED_OUTPATIENT_CLINIC_OR_DEPARTMENT_OTHER): Payer: Medicare Other

## 2022-09-10 DIAGNOSIS — F0394 Unspecified dementia, unspecified severity, with anxiety: Secondary | ICD-10-CM | POA: Diagnosis present

## 2022-09-10 DIAGNOSIS — N390 Urinary tract infection, site not specified: Secondary | ICD-10-CM | POA: Diagnosis present

## 2022-09-10 DIAGNOSIS — Z66 Do not resuscitate: Secondary | ICD-10-CM | POA: Diagnosis present

## 2022-09-10 DIAGNOSIS — N1831 Chronic kidney disease, stage 3a: Secondary | ICD-10-CM | POA: Diagnosis not present

## 2022-09-10 DIAGNOSIS — I480 Paroxysmal atrial fibrillation: Secondary | ICD-10-CM | POA: Diagnosis present

## 2022-09-10 DIAGNOSIS — E785 Hyperlipidemia, unspecified: Secondary | ICD-10-CM | POA: Diagnosis present

## 2022-09-10 DIAGNOSIS — K219 Gastro-esophageal reflux disease without esophagitis: Secondary | ICD-10-CM | POA: Diagnosis present

## 2022-09-10 DIAGNOSIS — D649 Anemia, unspecified: Secondary | ICD-10-CM | POA: Diagnosis not present

## 2022-09-10 DIAGNOSIS — I5022 Chronic systolic (congestive) heart failure: Secondary | ICD-10-CM | POA: Diagnosis present

## 2022-09-10 DIAGNOSIS — N3001 Acute cystitis with hematuria: Secondary | ICD-10-CM

## 2022-09-10 DIAGNOSIS — E782 Mixed hyperlipidemia: Secondary | ICD-10-CM

## 2022-09-10 DIAGNOSIS — B965 Pseudomonas (aeruginosa) (mallei) (pseudomallei) as the cause of diseases classified elsewhere: Secondary | ICD-10-CM | POA: Diagnosis not present

## 2022-09-10 DIAGNOSIS — I482 Chronic atrial fibrillation, unspecified: Secondary | ICD-10-CM | POA: Insufficient documentation

## 2022-09-10 DIAGNOSIS — Z79899 Other long term (current) drug therapy: Secondary | ICD-10-CM

## 2022-09-10 DIAGNOSIS — M1611 Unilateral primary osteoarthritis, right hip: Secondary | ICD-10-CM | POA: Diagnosis present

## 2022-09-10 DIAGNOSIS — Z888 Allergy status to other drugs, medicaments and biological substances status: Secondary | ICD-10-CM

## 2022-09-10 DIAGNOSIS — Z87891 Personal history of nicotine dependence: Secondary | ICD-10-CM

## 2022-09-10 DIAGNOSIS — N3000 Acute cystitis without hematuria: Secondary | ICD-10-CM | POA: Diagnosis present

## 2022-09-10 DIAGNOSIS — I130 Hypertensive heart and chronic kidney disease with heart failure and stage 1 through stage 4 chronic kidney disease, or unspecified chronic kidney disease: Secondary | ICD-10-CM | POA: Diagnosis present

## 2022-09-10 DIAGNOSIS — M1612 Unilateral primary osteoarthritis, left hip: Secondary | ICD-10-CM | POA: Diagnosis not present

## 2022-09-10 DIAGNOSIS — J439 Emphysema, unspecified: Secondary | ICD-10-CM | POA: Diagnosis present

## 2022-09-10 DIAGNOSIS — C679 Malignant neoplasm of bladder, unspecified: Secondary | ICD-10-CM | POA: Diagnosis not present

## 2022-09-10 DIAGNOSIS — E860 Dehydration: Secondary | ICD-10-CM | POA: Diagnosis not present

## 2022-09-10 DIAGNOSIS — S8012XA Contusion of left lower leg, initial encounter: Secondary | ICD-10-CM | POA: Diagnosis not present

## 2022-09-10 DIAGNOSIS — E861 Hypovolemia: Secondary | ICD-10-CM | POA: Diagnosis not present

## 2022-09-10 DIAGNOSIS — J9611 Chronic respiratory failure with hypoxia: Secondary | ICD-10-CM | POA: Diagnosis not present

## 2022-09-10 DIAGNOSIS — J4489 Other specified chronic obstructive pulmonary disease: Secondary | ICD-10-CM | POA: Insufficient documentation

## 2022-09-10 DIAGNOSIS — G9341 Metabolic encephalopathy: Secondary | ICD-10-CM | POA: Diagnosis present

## 2022-09-10 DIAGNOSIS — Z9981 Dependence on supplemental oxygen: Secondary | ICD-10-CM | POA: Diagnosis not present

## 2022-09-10 DIAGNOSIS — T83518A Infection and inflammatory reaction due to other urinary catheter, initial encounter: Secondary | ICD-10-CM | POA: Diagnosis not present

## 2022-09-10 DIAGNOSIS — R9431 Abnormal electrocardiogram [ECG] [EKG]: Secondary | ICD-10-CM | POA: Diagnosis not present

## 2022-09-10 DIAGNOSIS — Z1629 Resistance to other single specified antibiotic: Secondary | ICD-10-CM | POA: Diagnosis present

## 2022-09-10 DIAGNOSIS — C61 Malignant neoplasm of prostate: Secondary | ICD-10-CM | POA: Insufficient documentation

## 2022-09-10 DIAGNOSIS — Z1623 Resistance to quinolones and fluoroquinolones: Secondary | ICD-10-CM | POA: Diagnosis not present

## 2022-09-10 DIAGNOSIS — Z85828 Personal history of other malignant neoplasm of skin: Secondary | ICD-10-CM

## 2022-09-10 DIAGNOSIS — Z8249 Family history of ischemic heart disease and other diseases of the circulatory system: Secondary | ICD-10-CM

## 2022-09-10 DIAGNOSIS — Y738 Miscellaneous gastroenterology and urology devices associated with adverse incidents, not elsewhere classified: Secondary | ICD-10-CM | POA: Diagnosis present

## 2022-09-10 DIAGNOSIS — E871 Hypo-osmolality and hyponatremia: Secondary | ICD-10-CM | POA: Diagnosis not present

## 2022-09-10 DIAGNOSIS — I5042 Chronic combined systolic (congestive) and diastolic (congestive) heart failure: Secondary | ICD-10-CM | POA: Diagnosis not present

## 2022-09-10 DIAGNOSIS — J9612 Chronic respiratory failure with hypercapnia: Secondary | ICD-10-CM | POA: Diagnosis present

## 2022-09-10 DIAGNOSIS — I10 Essential (primary) hypertension: Secondary | ICD-10-CM | POA: Diagnosis not present

## 2022-09-10 DIAGNOSIS — F411 Generalized anxiety disorder: Secondary | ICD-10-CM | POA: Diagnosis not present

## 2022-09-10 DIAGNOSIS — Z8551 Personal history of malignant neoplasm of bladder: Secondary | ICD-10-CM | POA: Diagnosis not present

## 2022-09-10 DIAGNOSIS — A419 Sepsis, unspecified organism: Secondary | ICD-10-CM | POA: Diagnosis not present

## 2022-09-10 DIAGNOSIS — I2510 Atherosclerotic heart disease of native coronary artery without angina pectoris: Secondary | ICD-10-CM | POA: Diagnosis present

## 2022-09-10 DIAGNOSIS — Z823 Family history of stroke: Secondary | ICD-10-CM

## 2022-09-10 LAB — SODIUM: Sodium: 125 mmol/L — ABNORMAL LOW (ref 135–145)

## 2022-09-10 LAB — CBC WITH DIFFERENTIAL/PLATELET
Abs Immature Granulocytes: 0.16 10*3/uL — ABNORMAL HIGH (ref 0.00–0.07)
Basophils Absolute: 0 10*3/uL (ref 0.0–0.1)
Basophils Relative: 0 %
Eosinophils Absolute: 0.1 10*3/uL (ref 0.0–0.5)
Eosinophils Relative: 1 %
HCT: 32.3 % — ABNORMAL LOW (ref 39.0–52.0)
Hemoglobin: 9.9 g/dL — ABNORMAL LOW (ref 13.0–17.0)
Immature Granulocytes: 2 %
Lymphocytes Relative: 10 %
Lymphs Abs: 1 10*3/uL (ref 0.7–4.0)
MCH: 28.9 pg (ref 26.0–34.0)
MCHC: 30.7 g/dL (ref 30.0–36.0)
MCV: 94.2 fL (ref 80.0–100.0)
Monocytes Absolute: 1.7 10*3/uL — ABNORMAL HIGH (ref 0.1–1.0)
Monocytes Relative: 16 %
Neutro Abs: 7.4 10*3/uL (ref 1.7–7.7)
Neutrophils Relative %: 71 %
Platelets: 231 10*3/uL (ref 150–400)
RBC: 3.43 MIL/uL — ABNORMAL LOW (ref 4.22–5.81)
RDW: 13 % (ref 11.5–15.5)
WBC: 10.5 10*3/uL (ref 4.0–10.5)
nRBC: 0 % (ref 0.0–0.2)

## 2022-09-10 LAB — COMPREHENSIVE METABOLIC PANEL
ALT: 31 U/L (ref 0–44)
AST: 33 U/L (ref 15–41)
Albumin: 3.8 g/dL (ref 3.5–5.0)
Alkaline Phosphatase: 50 U/L (ref 38–126)
Anion gap: 5 (ref 5–15)
BUN: 15 mg/dL (ref 8–23)
CO2: 44 mmol/L — ABNORMAL HIGH (ref 22–32)
Calcium: 9.2 mg/dL (ref 8.9–10.3)
Chloride: 75 mmol/L — ABNORMAL LOW (ref 98–111)
Creatinine, Ser: 1.21 mg/dL (ref 0.61–1.24)
GFR, Estimated: 57 mL/min — ABNORMAL LOW (ref >60)
Glucose, Bld: 112 mg/dL — ABNORMAL HIGH (ref 70–99)
Potassium: 4.1 mmol/L (ref 3.5–5.1)
Sodium: 124 mmol/L — ABNORMAL LOW (ref 135–145)
Total Bilirubin: 0.6 mg/dL (ref 0.3–1.2)
Total Protein: 6.2 g/dL — ABNORMAL LOW (ref 6.5–8.1)

## 2022-09-10 LAB — URINALYSIS, W/ REFLEX TO CULTURE (INFECTION SUSPECTED)
Bilirubin Urine: NEGATIVE
Glucose, UA: NEGATIVE mg/dL
Ketones, ur: NEGATIVE mg/dL
Nitrite: NEGATIVE
RBC / HPF: >50 RBC/hpf (ref 0–5)
Specific Gravity, Urine: 1.006 (ref 1.005–1.030)
WBC, UA: >50 WBC/hpf (ref 0–5)
pH: 6.5 (ref 5.0–8.0)

## 2022-09-10 LAB — BRAIN NATRIURETIC PEPTIDE: B Natriuretic Peptide: 472.8 pg/mL — ABNORMAL HIGH (ref 0.0–100.0)

## 2022-09-10 LAB — PROTIME-INR
INR: 1 (ref 0.8–1.2)
Prothrombin Time: 13.1 seconds (ref 11.4–15.2)

## 2022-09-10 LAB — LACTIC ACID, PLASMA: Lactic Acid, Venous: 1.3 mmol/L (ref 0.5–1.9)

## 2022-09-10 MED ORDER — SENNOSIDES-DOCUSATE SODIUM 8.6-50 MG PO TABS: 1.0000 | ORAL_TABLET | Freq: Every evening | ORAL | Status: DC | PRN

## 2022-09-10 MED ORDER — METOPROLOL TARTRATE 5 MG/5ML IV SOLN: 2.5000 mg | INTRAVENOUS | Status: DC | PRN

## 2022-09-10 MED ORDER — UMECLIDINIUM-VILANTEROL 62.5-25 MCG/ACT IN AEPB
1.0000 | INHALATION_SPRAY | Freq: Every day | RESPIRATORY_TRACT | Status: DC
  Administered 2022-09-11 – 2022-09-13 (×3): 1 via RESPIRATORY_TRACT
  Filled 2022-09-10: qty 14

## 2022-09-10 MED ORDER — FUROSEMIDE 40 MG PO TABS: 40.0000 mg | ORAL_TABLET | Freq: Every day | ORAL | Status: DC

## 2022-09-10 MED ORDER — METOPROLOL SUCCINATE ER 50 MG PO TB24: 50.0000 mg | ORAL_TABLET | Freq: Every day | ORAL | Status: DC

## 2022-09-10 MED ORDER — SODIUM CHLORIDE 0.9 % IV SOLN
1.0000 g | Freq: Three times a day (TID) | INTRAVENOUS | Status: DC
  Filled 2022-09-10 (×2): qty 1

## 2022-09-10 MED ORDER — SODIUM CHLORIDE 0.9 % IV SOLN: 250.0000 mL | INTRAVENOUS | Status: DC | PRN

## 2022-09-10 MED ORDER — ATORVASTATIN CALCIUM 40 MG PO TABS
40.0000 mg | ORAL_TABLET | Freq: Every day | ORAL | Status: DC
  Administered 2022-09-11 – 2022-09-13 (×3): 40 mg via ORAL
  Filled 2022-09-10: qty 1
  Filled 2022-09-10: qty 2
  Filled 2022-09-10: qty 1

## 2022-09-10 MED ORDER — SODIUM CHLORIDE 0.9% FLUSH: 3.0000 mL | INTRAVENOUS | Status: DC | PRN

## 2022-09-10 MED ORDER — ENSURE ENLIVE PO LIQD
237.0000 mL | Freq: Two times a day (BID) | ORAL | Status: DC
  Administered 2022-09-11 – 2022-09-13 (×5): 237 mL via ORAL

## 2022-09-10 MED ORDER — ALBUTEROL SULFATE (2.5 MG/3ML) 0.083% IN NEBU: 2.5000 mg | INHALATION_SOLUTION | Freq: Four times a day (QID) | RESPIRATORY_TRACT | Status: DC | PRN

## 2022-09-10 MED ORDER — SODIUM CHLORIDE 0.9 % IV SOLN
1.0000 g | Freq: Two times a day (BID) | INTRAVENOUS | Status: DC
  Administered 2022-09-10 – 2022-09-13 (×6): 1 g via INTRAVENOUS
  Filled 2022-09-10 (×8): qty 1

## 2022-09-10 MED ORDER — ACETAMINOPHEN 325 MG PO TABS: 650.0000 mg | ORAL_TABLET | Freq: Four times a day (QID) | ORAL | Status: DC | PRN

## 2022-09-10 MED ORDER — PANTOPRAZOLE SODIUM 40 MG PO TBEC
40.0000 mg | DELAYED_RELEASE_TABLET | Freq: Two times a day (BID) | ORAL | Status: DC
  Administered 2022-09-11 – 2022-09-13 (×5): 40 mg via ORAL
  Filled 2022-09-10 (×5): qty 1

## 2022-09-10 MED ORDER — LEVALBUTEROL HCL 0.63 MG/3ML IN NEBU: 0.6300 mg | INHALATION_SOLUTION | Freq: Three times a day (TID) | RESPIRATORY_TRACT | Status: DC | PRN

## 2022-09-10 MED ORDER — SODIUM CHLORIDE 0.9 % IV SOLN
2.0000 g | Freq: Once | INTRAVENOUS | Status: AC
  Administered 2022-09-10: 2 g via INTRAVENOUS

## 2022-09-10 MED ORDER — TAMSULOSIN HCL 0.4 MG PO CAPS
0.4000 mg | ORAL_CAPSULE | Freq: Every day | ORAL | Status: DC
  Administered 2022-09-11 – 2022-09-13 (×3): 0.4 mg via ORAL
  Filled 2022-09-10 (×3): qty 1

## 2022-09-10 MED ORDER — LORATADINE 10 MG PO TABS
10.0000 mg | ORAL_TABLET | Freq: Every day | ORAL | Status: DC
  Administered 2022-09-11 – 2022-09-13 (×3): 10 mg via ORAL
  Filled 2022-09-10 (×3): qty 1

## 2022-09-10 MED ORDER — ADULT MULTIVITAMIN W/MINERALS CH
1.0000 | ORAL_TABLET | Freq: Every day | ORAL | Status: DC
  Administered 2022-09-11 – 2022-09-13 (×3): 1 via ORAL
  Filled 2022-09-10 (×3): qty 1

## 2022-09-10 MED ORDER — SODIUM CHLORIDE 0.9% FLUSH
3.0000 mL | Freq: Two times a day (BID) | INTRAVENOUS | Status: DC
  Administered 2022-09-11 – 2022-09-12 (×3): 3 mL via INTRAVENOUS

## 2022-09-10 MED ORDER — SODIUM CHLORIDE 0.9 % IV SOLN: INTRAVENOUS | Status: AC

## 2022-09-10 MED ORDER — ESCITALOPRAM OXALATE 10 MG PO TABS
10.0000 mg | ORAL_TABLET | Freq: Every day | ORAL | Status: DC
  Filled 2022-09-10: qty 1

## 2022-09-10 MED ORDER — SODIUM CHLORIDE 0.9 % IV SOLN
2.0000 g | Freq: Two times a day (BID) | INTRAVENOUS | Status: DC
  Filled 2022-09-10: qty 2

## 2022-09-10 MED ORDER — LACTATED RINGERS IV BOLUS
500.0000 mL | Freq: Once | INTRAVENOUS | Status: AC
  Administered 2022-09-10: 500 mL via INTRAVENOUS

## 2022-09-10 MED ORDER — ACETAMINOPHEN 650 MG RE SUPP: 650.0000 mg | Freq: Four times a day (QID) | RECTAL | Status: DC | PRN

## 2022-09-10 NOTE — ED Triage Notes (Signed)
Pt presents with increased confusion and home health collected a urine specimen. Pt's PCP called to advise them to go to the ED due to urine culture showing drug resistant bacteria.

## 2022-09-10 NOTE — Plan of Care (Signed)
Plan of Care Note for Accepted Transfer   Patient: Gerald Valdez    WJX:[id number]     Facility requesting transfer: [facility] ER Requesting Provider: Claudette Stapler, PA-C  89 year old gentleman with bladder cancer, COPD, GERD, CKD, hypertension and hyponatremia as well as chronic Foley catheter being admitted to the hospital with recurrent urinary tract infection.  He had recent urine culture with his PCP was reportedly done due to increased confusion, photograph of his culture and sensitivities is in the chart.  Was given a dose of IV ceftazidime in the ER based on sensitivity data.  Most recent vitals, labs and radiology:  Blood pressure 107/68, pulse 98, temperature 98.9 F (37.2 C), temperature source Rectal, resp. rate 19, height 5\' 6"  (1.676 m), weight 54.9 kg, SpO2 99%.      Latest Ref Rng & Units 09/10/2022   11:24 AM 08/13/2022    2:17 AM 08/12/2022    1:02 AM  CBC  WBC 4.0 - 10.5 K/uL 10.5  10.0  13.0   Hemoglobin 13.0 - 17.0 g/dL 9.9  9.0  8.5   Hematocrit 39.0 - 52.0 % 32.3  28.9  27.1   Platelets 150 - 400 K/uL 231  269  252       Latest Ref Rng & Units 09/10/2022   11:24 AM 08/15/2022    2:14 AM 08/14/2022    2:23 AM  BMP  Glucose 70 - 99 mg/dL 213  086  578   BUN 8 - 23 mg/dL 15  16  15    Creatinine 0.61 - 1.24 mg/dL 4.69  6.29  5.28   Sodium 135 - 145 mmol/L 124  135  134   Potassium 3.5 - 5.1 mmol/L 4.1  3.8  4.5   Chloride 98 - 111 mmol/L 75  86  85   CO2 22 - 32 mmol/L 44  40  43   Calcium 8.9 - 10.3 mg/dL 9.2  8.7  8.7    DG Chest Port 1 View  Result Date: 09/10/2022 CLINICAL DATA:  Sepsis EXAM: PORTABLE CHEST 1 VIEW COMPARISON:  CXR 08/11/22 FINDINGS: No pleural effusion. No pneumothorax. No focal opacity. Normal cardiac and mediastinal contours. No radiographically apparent displaced rib fractures. Visualized upper abdomen unremarkable. IMPRESSION: No focal airspace opacity Electronically Signed   By: Lorenza Cambridge M.D.   On: 09/10/2022 12:29    The patient has been  accepted for transfer to [facility] or [facility], depending on bed and resource availability. The patient will remain under the care and responsibility of the referring provider until they have arrived to our inpatient facility.  Author:  Sharlette Dense, MD  09/10/2022  Check [email or web address] for on-call coverage.  Nursing staff, Please call [facility] Admits & Consults System-Wide number on Amion as soon as patient's arrival, so appropriate admitting provider can evaluate the pt.

## 2022-09-10 NOTE — H&P (Incomplete)
History and Physical    Gerald Valdez MVH:[id number] DOB: 06/01/1928 DOA: 09/10/2022  PCP: [name], MD   Patient coming from: Home   Chief Complaint:  Chief Complaint  Patient presents with   Urinary Tract Infection    HPI:  Gerald Valdez is a 89 y.o. male with medical history significant of of bladder cancer on  chronic Foley, chronic hypoxic and hypercapnic respiratory failure due to COPD on 3 L oxygen at home, congestive heart failure reduced EF 40 to 45%, hypertension, hyperlipidemia, chronic anemia, CKD stage IIIa and atrial fibrillation not on Eliquis due to recurrent hematuria presented to emergency department referred by primary care provider due to increased confusion and UA and urine culture concern for UTI. Transferred from [facility] to [facility] for management of complicated UTI.  Patient has been seen by the primary care doctor reported patient found increased confusion and sent him to the ED for evaluation.  In the ED patient received ceftazidime and transferred over here at [facility] for evaluation.  Patient has been recently discharged from the hospital 08/15/18/2024 for development of acute on chronic hypoxic respiratory failure due to volume overload required BiPAP.  Mental status improved.  Palliative evaluated the patient and on discharge med arrangement to follow-up outpatient palliative care.  He was managed with IV Lasix and remained -7.3 later transitioned to oral Lasix 40 mg daily.  GDMT hold due to soft blood pressure.  History of paroxysmal atrial fibrillation however rhythm control.  He was taken off anticoagulation due to recurrent hematuria with a history of bladder cancer.  After discussion with the family Eliquis has been permanently discontinued and patient discharged with low-dose Toprol.  Patient also found hyponatremic due to volume overload which improved on discharge.  History of bladder cancer recent TURBT in April currently on  chronic Foley.  Last admission patient did not have any UTI.  ED Course:  At initial presentation heart rate 108, respiratory rate 20 4 blood pressure 103/63 O2 sat 99% room air.  Patient heart rate improved to 94 and respiratory rate improved to 20 and blood pressure improved to 113/54 and remaining on 3 L nasal cannula oxygen. EKG showed atrial fibrillation heart rate 92.  Lab work showed low sodium 124 (baseline sodium around 134 to 136), potassium 4.1, low chloride 75, elevated bicarb 44, blood glucose 112, BUN 15, creatinine 1.  2 1 around baseline, GFR 57.  Anion gap 5. Lactic acid 1.2 within normal range. CBC showed WBC 10.5, RBC 3.43, hemoglobin 9.9, platelet 231. Normal pro time INR UA hazy appearance, hemoglobin positive, trace protein, leukocyte esterase positive, rare bacteria.  Pending urine culture. Pending blood cultures x 2.  Chest x-ray no pleural effusion or pneumothorax.  Normal cardiac and mediastinal contour.  X-ray  hip no acute abnormality and fracture.  Moderate to severe left and right hip osteoarthritis.  X-ray left femur no acute abnormality.  Patient has been transferred to [facility] for management of acute metabolic encephalopathy in the setting of complicated cystitis.  Review of Systems:  Review of Systems  Unable to perform ROS: Acuity of condition    Past Medical History:  Diagnosis Date   Acute duodenal ulcer with bleeding 01/30/2014   Acute post-hemorrhagic anemia 01/30/2014   Anxiety    Aortic atherosclerosis (HCC) 02/10/2020   Arthritis    Basal cell carcinoma    Cancer (HCC)    bladder   Chronic kidney disease    COPD exacerbation (  HCC) 02/10/2020   Coronary artery disease    Dyspnea    Dyspnea on exertion 02/10/2020   Emphysema of lung (HCC)    Essential hypertension 02/10/2020   GERD (gastroesophageal reflux disease) 02/10/2020   GI bleed 01/30/2014   Hyperlipidemia 02/10/2020    Past Surgical History:  Procedure  Laterality Date   BLADDER SURGERY     CATARACT EXTRACTION W/ INTRAOCULAR LENS IMPLANT     CYSTOSCOPY W/ RETROGRADES Bilateral 11/20/2020   Procedure: CYSTOSCOPY WITH RETROGRADE PYELOGRAM;  Surgeon: Marcine Matar, MD;  Location: [facility];  Service: Urology;  Laterality: Bilateral;   CYSTOSCOPY W/ RETROGRADES Right 02/12/2021   Procedure: CYSTOSCOPY WITH RETROGRADE PYELOGRAM;  Surgeon: Marcine Matar, MD;  Location: [facility];  Service: Urology;  Laterality: Right;   CYSTOSCOPY W/ RETROGRADES Bilateral 06/25/2021   Procedure: CYSTOSCOPY WITH RETROGRADE PYELOGRAM;  Surgeon: Marcine Matar, MD;  Location: [facility];  Service: Urology;  Laterality: Bilateral;   ESOPHAGOGASTRODUODENOSCOPY N/A 01/30/2014   Procedure: ESOPHAGOGASTRODUODENOSCOPY (EGD);  Surgeon: Louis Meckel, MD;  Location: [facility];  Service: Endoscopy;  Laterality: N/A;   TONSILLECTOMY     TRANSURETHRAL RESECTION OF BLADDER TUMOR N/A 02/12/2021   Procedure: REPEAT TRANSURETHRAL RESECTION OF BLADDER TUMOR (TURBT);  Surgeon: Marcine Matar, MD;  Location: [facility];  Service: Urology;  Laterality: N/A;   TRANSURETHRAL RESECTION OF BLADDER TUMOR WITH MITOMYCIN-C N/A 11/20/2020   Procedure: TRANSURETHRAL RESECTION OF BLADDER TUMOR;  Surgeon: Marcine Matar, MD;  Location: [facility];  Service: Urology;  Laterality: N/A;   TRANSURETHRAL RESECTION OF BLADDER TUMOR WITH MITOMYCIN-C N/A 06/25/2021   Procedure: TRANSURETHRAL RESECTION OF BLADDER TUMOR WITH GEMCITABINE;  Surgeon: Marcine Matar, MD;  Location: [facility];  Service: Urology;  Laterality: N/A;  1 HR   TRANSURETHRAL RESECTION OF BLADDER TUMOR WITH MITOMYCIN-C N/A 12/10/2021   Procedure: TRANSURETHRAL RESECTION OF BLADDER TUMOR WITH GEMCITABINE;  Surgeon: Marcine Matar, MD;  Location: [facility];  Service: Urology;  Laterality: N/A;  1 HR   TRANSURETHRAL RESECTION OF BLADDER TUMOR WITH MITOMYCIN-C N/A 05/27/2022   Procedure: TRANSURETHRAL RESECTION OF BLADDER TUMOR;   Surgeon: Marcine Matar, MD;  Location: [facility];  Service: Urology;  Laterality: N/A;     reports that he quit smoking about 30 years ago. His smoking use included cigarettes. He started smoking about 77 years ago. He has never used smokeless tobacco. He reports that he does not currently use alcohol. He reports that he does not use drugs.  Allergies  Allergen Reactions   Prednisone Other (See Comments)    Unknown reaction    Family History  Problem Relation Age of Onset   Hypertension Mother    Stroke Father     Prior to Admission medications   Medication Sig Start Date End Date Taking? Authorizing Provider  albuterol (PROVENTIL) (2.5 MG/3ML) 0.083% nebulizer solution Take 3 mLs (2.5 mg total) by nebulization every 6 (six) hours as needed for wheezing or shortness of breath. Chronic hypercapneic respiratory failure and COPD J96.12, J44.9 08/17/22   Russella Dar, NP  ascorbic acid (VITAMIN C) 500 MG tablet Take 500 mg by mouth daily.    [provider]  atorvastatin (LIPITOR) 40 MG tablet Take 1 tablet (40 mg total) by mouth daily. 07/28/22   Lanae Boast, MD  escitalopram (LEXAPRO) 10 MG tablet Take 10 mg by mouth daily.    [provider]  feeding supplement (ENSURE ENLIVE / ENSURE PLUS) LIQD Take 237 mLs by mouth 2 (two) times daily between meals. 07/27/22  Lanae Boast, MD  furosemide (LASIX) 40 MG tablet Take 1 tablet (40 mg total) by mouth daily. 08/15/22   Zannie Cove, MD  loratadine (CLARITIN) 10 MG tablet Take 10 mg by mouth daily.    [provider]  metoprolol succinate (TOPROL-XL) 25 MG 24 hr tablet Take 2 tablets (50 mg total) by mouth daily. 08/15/22   Zannie Cove, MD  Multiple Vitamin (MULTIVITAMIN WITH MINERALS) TABS tablet Take 1 tablet by mouth daily with breakfast.    [provider]  OXYGEN Inhale 3 L into the lungs continuous.    [provider]  pantoprazole (PROTONIX) 40 MG tablet Take 1 tablet (40 mg total) by mouth  2 (two) times daily before a meal. 08/06/22   Jonah Blue, MD  polyethylene glycol (MIRALAX / GLYCOLAX) 17 g packet Take 17 g by mouth daily as needed for mild constipation. 07/27/22   Lanae Boast, MD  potassium chloride SA (KLOR-CON M) 20 MEQ tablet Take 1 tablet (20 mEq total) by mouth daily. 08/15/22   Zannie Cove, MD  saccharomyces boulardii (FLORASTOR) 250 MG capsule Take 1 capsule (250 mg total) by mouth 2 (two) times daily. 07/05/21   Leatha Gilding, MD  tamsulosin (FLOMAX) 0.4 MG CAPS capsule Take 1 capsule (0.4 mg total) by mouth daily. 08/06/22   Jonah Blue, MD  umeclidinium-vilanterol [facility] ELLIPTA) 62.5-25 MCG/ACT AEPB Inhale 1 puff into the lungs daily. 08/25/22   Luciano Cutter, MD     Physical Exam: Vitals:   09/10/22 1715 09/10/22 1730 09/10/22 1953 09/11/22 0035  BP: (!) 120/58 110/63 (!) 113/54 (!) 102/44  Pulse: 96 93 94 100  Resp: 19 (!) 24 20 20   Temp:   97.7 F (36.5 C) 97.9 F (36.6 C)  TempSrc:   Oral Oral  SpO2: 97% 97% 97% 90%  Weight:      Height:        Physical Exam Constitutional:      General: He is not in acute distress.    Appearance: He is not ill-appearing.     Comments: Chronically ill-appearing  HENT:     Head: Normocephalic and atraumatic.     Mouth/Throat:     Mouth: Mucous membranes are dry.  Eyes:     Pupils: Pupils are equal, round, and reactive to light.  Cardiovascular:     Rate and Rhythm: Normal rate. Rhythm irregular.     Pulses: Normal pulses.  Pulmonary:     Effort: Pulmonary effort is normal. No respiratory distress.     Breath sounds: Normal breath sounds. No stridor. No wheezing.  Abdominal:     General: Bowel sounds are normal. There is no distension.     Tenderness: There is no abdominal tenderness.  Musculoskeletal:        General: No swelling.     Cervical back: Neck supple.     Right lower leg: No edema.     Left lower leg: No edema.  Skin:    General: Skin is warm.     Capillary Refill: Capillary  refill takes less than 2 seconds.  Neurological:     Comments: Patient response to deep sternal rub and pain  Psychiatric:     Comments: Unable to assess      Labs on Admission: I have personally reviewed following labs and imaging studies  CBC: Recent Labs  Lab 09/10/22 1124  WBC 10.5  NEUTROABS 7.4  HGB 9.9*  HCT 32.3*  MCV 94.2  PLT 231  Basic Metabolic Panel: Recent Labs  Lab 09/10/22 1124 09/10/22 2046  NA 124* 125*  K 4.1  --   CL 75*  --   CO2 44*  --   GLUCOSE 112*  --   BUN 15  --   CREATININE 1.21  --   CALCIUM 9.2  --    GFR: Estimated Creatinine Clearance: 30.9 mL/min (by C-G formula based on SCr of 1.21 mg/dL). Liver Function Tests: Recent Labs  Lab 09/10/22 1124  AST 33  ALT 31  ALKPHOS 50  BILITOT 0.6  PROT 6.2*  ALBUMIN 3.8   No results for input(s): "LIPASE", "AMYLASE" in the last 168 hours. No results for input(s): "AMMONIA" in the last 168 hours. Coagulation Profile: Recent Labs  Lab 09/10/22 1124  INR 1.0   Cardiac Enzymes: No results for input(s): "CKTOTAL", "CKMB", "CKMBINDEX", "TROPONINI", "TROPONINIHS" in the last 168 hours. BNP (last 3 results) Recent Labs    08/05/22 0545 08/11/22 0020 09/10/22 2046  BNP 595.5* 851.4* 472.8*   HbA1C: No results for input(s): "HGBA1C" in the last 72 hours. CBG: No results for input(s): "GLUCAP" in the last 168 hours. Lipid Profile: No results for input(s): "CHOL", "HDL", "LDLCALC", "TRIG", "CHOLHDL", "LDLDIRECT" in the last 72 hours. Thyroid Function Tests: No results for input(s): "TSH", "T4TOTAL", "FREET4", "T3FREE", "THYROIDAB" in the last 72 hours. Anemia Panel: No results for input(s): "VITAMINB12", "FOLATE", "FERRITIN", "TIBC", "IRON", "RETICCTPCT" in the last 72 hours. Urine analysis:    Component Value Date/Time   COLORURINE YELLOW 09/10/2022 1145   APPEARANCEUR HAZY (A) 09/10/2022 1145   LABSPEC 1.006 09/10/2022 1145   PHURINE 6.5 09/10/2022 1145   GLUCOSEU NEGATIVE  09/10/2022 1145   HGBUR LARGE (A) 09/10/2022 1145   BILIRUBINUR NEGATIVE 09/10/2022 1145   KETONESUR NEGATIVE 09/10/2022 1145   PROTEINUR TRACE (A) 09/10/2022 1145   NITRITE NEGATIVE 09/10/2022 1145   LEUKOCYTESUR LARGE (A) 09/10/2022 1145    Radiological Exams on Admission: I have personally reviewed images DG Hip Unilat W or Wo Pelvis 2-3 Views Left  Result Date: 09/10/2022 CLINICAL DATA:  bruising EXAM: DG HIP (WITH OR WITHOUT PELVIS) 2-3V LEFT; LEFT FEMUR 1 VIEW COMPARISON:  CT scan abdomen and pelvis from 08/01/2022. FINDINGS: No acute fracture or dislocation. No aggressive osseous lesion. Visualized sacral arcuate lines are unremarkable. Unremarkable sacro-iliac joint. There are moderate to severe degenerative changes of the left hip joint in the form of reduced joint space, subchondral sclerosis / cystic changes and osteophytosis. There are mild degenerative changes of the right hip joint with mild joint space narrowing. Osteophytosis of the superior acetabulum. No radiopaque foreign bodies. IMPRESSION: 1. No acute osseous abnormality of the pelvis, left hip or left femur. 2. Moderate to severe left and mild right hip osteoarthritis. Electronically Signed   By: Jules Schick M.D.   On: 09/10/2022 13:40   DG Femur 1 View Left  Result Date: 09/10/2022 CLINICAL DATA:  bruising EXAM: DG HIP (WITH OR WITHOUT PELVIS) 2-3V LEFT; LEFT FEMUR 1 VIEW COMPARISON:  CT scan abdomen and pelvis from 08/01/2022. FINDINGS: No acute fracture or dislocation. No aggressive osseous lesion. Visualized sacral arcuate lines are unremarkable. Unremarkable sacro-iliac joint. There are moderate to severe degenerative changes of the left hip joint in the form of reduced joint space, subchondral sclerosis / cystic changes and osteophytosis. There are mild degenerative changes of the right hip joint with mild joint space narrowing. Osteophytosis of the superior acetabulum. No radiopaque foreign bodies. IMPRESSION: 1. No  acute osseous abnormality of  the pelvis, left hip or left femur. 2. Moderate to severe left and mild right hip osteoarthritis. Electronically Signed   By: Jules Schick M.D.   On: 09/10/2022 13:40   DG Chest Port 1 View  Result Date: 09/10/2022 CLINICAL DATA:  Sepsis EXAM: PORTABLE CHEST 1 VIEW COMPARISON:  CXR 08/11/22 FINDINGS: No pleural effusion. No pneumothorax. No focal opacity. Normal cardiac and mediastinal contours. No radiographically apparent displaced rib fractures. Visualized upper abdomen unremarkable. IMPRESSION: No focal airspace opacity Electronically Signed   By: Lorenza Cambridge M.D.   On: 09/10/2022 12:29    EKG: My personal interpretation of EKG shows: Atrial fibrillation rate controlled 92.  No ST and T wave abnormality.     Assessment/Plan: Principal Problem:   Complicated UTI (urinary tract infection) Active Problems:   Acute metabolic encephalopathy   Acute cystitis   Chronic systolic CHF preserved EF 40 to 45% (congestive heart failure) (HCC)   Hyponatremia   Essential hypertension   Hyperlipidemia   History of bladder cancer on chronic Foley   Chronic hypoxic respiratory failure (HCC)   Chronic anemia   Atrial fibrillation, chronic (HCC)   COPD (chronic obstructive pulmonary disease) with chronic bronchitis    Assessment and Plan: Acute metabolic encephalopathy -Patient coming for to the ED referred by the primary care doctor as patient found more confused and UA and urine culture found evidence of bacteria. -Acute metabolic encephalopathy secondary from underlying hyponatremia as well as from UTI. - Continue treat for UTI with broad-spectrum antibiotic -Managing hyponatremia with NS 50 cc/h for 1 day. -Treatment plan is following. -Continue fall precaution and aspiration precaution. -Consulted with inpatient speech for swallow evaluation. - Neurocheck every 4 hours - Checking vital every 6 hours - Continue cardiac monitoring.   Complicated UTI Acute  cystitis  Bladder cancer on chronic Foley -Outpatient UA showed evidence of UTI and urine culture grew Pseudomonas ptudi.  Record under media tab. - Normal lactic acid.  WBC 10.5. - Based on the culture sensitivity patient received ceftazidime in the ED.  Repeat UA obtained in the ED which showed leukocyte esterase, rare bacteria and hemoglobin positive.  Pending urine culture. - Plan to continue ceftazidime 1 g twice daily. - Based on blood culture and urine culture result will decide about appropriate antibiotic. - Per family report Foley changed 7 days ago.  Requested bedside nurse to change the Foley today 8/2 -Monitor urine output    Acute on chronic hyponatremia Hypovolemic hyponatremia -Patient is hypovolemic on physical exam. -Serum sodium - Chest x-ray did not show any evidence of pulmonary vascular congestion.  Checking BNP - Low serum sodium 124.  Patient's baseline sodium is around 131-135. -Starting NS 75 cc/h for 1 day.  Checking sodium level every 4 hours. -Checking serum osmolarity, urine osmolarity and urine sodium - Continue to monitor volume status closely  Essential hypertension HFrEF 45 to 50% - Per chart review echocardiogram from June 2024 EF 45 to 50%, left ventricular hypertrophy and left ventricular diastolic parameter indeterminant - At home patient is on Lasix 40 mg daily, Toprol-XL 50 mg daily -In the setting of borderline low blood pressure decreasing dose of Toprol-XL to 25 mg daily.  Holding Lasix in the setting of hypovolemia and hyponatremia. -Continue to monitor heart rate and blood pressure. -As currently waiting for speech evaluation cannot give oral Toprol-XL.  Started as needed Lopressor 5 mg for heart rate more than 120.  Chronic hypoxic respiratory failure on 3 L oxygen COPD -Stable.  Chest  x-ray no acute cardiopulmonary process - Continue nasal cannula oxygen 3 to 4 L l/day to maintaining O2 sat 92% when sleeping and above 89 % while  awake. -Resume home Anoro Ellipta 1 puff daily -Continue  Xopenex nebulizer every 6 hours as needed for wheezing shortness of breath and   Paroxysmal atrial fibrillation -Per chart review and family discussion patient has been off Eliquis during last hospitalization given patient has recurrent hematuria.  Discussed with patient's family again today and they are our office that patient has atrial fibrillation which put him at risk of stroke however they want to keep patient off Eliquis as decided previously. -Continue Toprol-XL 25 mg daily.  Hyperlipidemia -Continue Lipitor 40 mg daily  Generalized anxiety disorder - Resume home Lexapro  Right thigh superficial skin tear - Consulted wound care for evaluation of right-sided skin tear  DVT prophylaxis:     Lovenox and SCD Code Status: Reviewed ACP document.  Patient is DNR Diet: Keep patient NPO.  At home patient is on soft mechanical diet.    Waiting for speech evaluation before starting diet and oral med. Family Communication: Discussed treatment plan with patient's daughter, and wife at the bedside. Disposition Plan: Plan to discharge to home next 3 to 4 days. Consults: Speech evaluation and wound care Admission status:   Inpatient, Step Down Unit  Severity of Illness: The appropriate patient status for this patient is INPATIENT. Inpatient status is judged to be reasonable and necessary in order to provide the required intensity of service to ensure the patient's safety. The patient's presenting symptoms, physical exam findings, and initial radiographic and laboratory data in the context of their chronic comorbidities is felt to place them at high risk for further clinical deterioration. Furthermore, it is not anticipated that the patient will be medically stable for discharge from the hospital within 2 midnights of admission.   * I certify that at the point of admission it is my clinical judgment that the patient will require inpatient  hospital care spanning beyond 2 midnights from the point of admission due to high intensity of service, high risk for further deterioration and high frequency of surveillance required.Marland Kitchen    Tereasa Coop, MD [facility]  How to contact the [facility] Attending or Consulting provider 7A - 7P or covering provider during after hours 7P -7A, for this patient.  Check the care team in [facility] and look for a) attending/consulting [facility] provider listed and b) the [facility] team listed Log into [email or web address] and use [place]'s universal password to access. If you do not have the password, please contact the hospital operator. Locate the [facility] provider you are looking for under [facility] and page to a number that you can be directly reached. If you still have difficulty reaching the provider, please page the [facility] (Director on Call) for the Hospitalists listed on amion for assistance.  09/11/2022, 12:57 AM

## 2022-09-10 NOTE — ED Notes (Signed)
[facility] at [facility] will send transport for Bed Ready at [facility] [id number] #[id number](NS)

## 2022-09-10 NOTE — ED Provider Notes (Signed)
[place] EMERGENCY DEPARTMENT AT [facility] Provider Note   CSN: [id number] Arrival date & time: 09/10/22  1042     History  Chief Complaint  Patient presents with   Urinary Tract Infection    Gerald Valdez is a 89 y.o. male with a past medical history significant for bladder cancer, COPD, GERD, hyperlipidemia, CKD, and hypertension who presents to the ED due to positive urine culture.  Per daughter and wife at bedside patient has been more confused for the past 2 days.  He has been having intermittent bouts of confusion ever since being discharged from the hospital in June.  Patient was advised by PCP to present to the ED after his urine culture showed resistance to all oral antibiotics.  Unclear which antibiotic patient is susceptible to.  Daughter is working on getting report.  No fever or chills.  Patient is unable to give history secondary to confusion. Level 5 caveat. Patient denies any pain.  Has a Foley catheter in place.  Last changed 1 week ago where UA was negative at that time.  He has been admitted 3 times since 6/10 for both COPD exacerbations and UTIs.  Patient was originally discharged to a rehab facility however, has been home for roughly 3 weeks per wife. Has chronic wheeze per daughter. Last did a breathing treatment yesterday.   History obtained from patient and past medical records. No interpreter used during encounter.       Home Medications Prior to Admission medications   Medication Sig Start Date End Date Taking? Authorizing Provider  albuterol (PROVENTIL) (2.5 MG/3ML) 0.083% nebulizer solution Take 3 mLs (2.5 mg total) by nebulization every 6 (six) hours as needed for wheezing or shortness of breath. Chronic hypercapneic respiratory failure and COPD J96.12, J44.9 08/17/22   Russella Dar, NP  ascorbic acid (VITAMIN C) 500 MG tablet Take 500 mg by mouth daily.    [provider]  atorvastatin (LIPITOR) 40 MG tablet Take 1 tablet (40 mg total)  by mouth daily. 07/28/22   Lanae Boast, MD  escitalopram (LEXAPRO) 10 MG tablet Take 10 mg by mouth daily.    [provider]  feeding supplement (ENSURE ENLIVE / ENSURE PLUS) LIQD Take 237 mLs by mouth 2 (two) times daily between meals. 07/27/22   Lanae Boast, MD  furosemide (LASIX) 40 MG tablet Take 1 tablet (40 mg total) by mouth daily. 08/15/22   Zannie Cove, MD  loratadine (CLARITIN) 10 MG tablet Take 10 mg by mouth daily.    [provider]  metoprolol succinate (TOPROL-XL) 25 MG 24 hr tablet Take 2 tablets (50 mg total) by mouth daily. 08/15/22   Zannie Cove, MD  Multiple Vitamin (MULTIVITAMIN WITH MINERALS) TABS tablet Take 1 tablet by mouth daily with breakfast.    [provider]  OXYGEN Inhale 3 L into the lungs continuous.    [provider]  pantoprazole (PROTONIX) 40 MG tablet Take 1 tablet (40 mg total) by mouth 2 (two) times daily before a meal. 08/06/22   Jonah Blue, MD  polyethylene glycol (MIRALAX / GLYCOLAX) 17 g packet Take 17 g by mouth daily as needed for mild constipation. 07/27/22   Lanae Boast, MD  potassium chloride SA (KLOR-CON M) 20 MEQ tablet Take 1 tablet (20 mEq total) by mouth daily. 08/15/22   Zannie Cove, MD  saccharomyces boulardii (FLORASTOR) 250 MG capsule Take 1 capsule (250 mg total) by mouth 2 (two) times daily. 07/05/21   Gherghe, Daylene Katayama,  MD  tamsulosin (FLOMAX) 0.4 MG CAPS capsule Take 1 capsule (0.4 mg total) by mouth daily. 08/06/22   Jonah Blue, MD  umeclidinium-vilanterol [facility] ELLIPTA) 62.5-25 MCG/ACT AEPB Inhale 1 puff into the lungs daily. 08/25/22   Luciano Cutter, MD      Allergies    Prednisone    Review of Systems   Review of Systems  Unable to perform ROS: Mental status change    Physical Exam Updated Vital Signs BP 110/63   Pulse 93   Temp 98.9 F (37.2 C) (Oral)   Resp (!) 24   Ht 5\' 6"  (1.676 m)   Wt 54.9 kg   SpO2 97%   BMI 19.53 kg/m  Physical Exam Vitals and nursing note  reviewed.  Constitutional:      General: He is not in acute distress.    Appearance: He is not ill-appearing.  HENT:     Head: Normocephalic.  Eyes:     Pupils: Pupils are equal, round, and reactive to light.  Cardiovascular:     Rate and Rhythm: Normal rate and regular rhythm.     Pulses: Normal pulses.     Heart sounds: Normal heart sounds. No murmur heard.    No friction rub. No gallop.  Pulmonary:     Effort: Pulmonary effort is normal.     Breath sounds: Normal breath sounds.  Abdominal:     General: Abdomen is flat. There is no distension.     Palpations: Abdomen is soft.     Tenderness: There is no abdominal tenderness. There is no guarding or rebound.     Comments: Foley bag with dark urine  Musculoskeletal:        General: Normal range of motion.     Cervical back: Neck supple.     Comments: Ecchymosis to left hip and femur.  No bony tenderness.  Full range of motion.  Skin:    General: Skin is warm and dry.  Neurological:     General: No focal deficit present.     Mental Status: He is alert.     Comments: Somnolent. Opens eyes to voice. Able to answer some questions. Moves all 4 extremities without difficulty  Psychiatric:        Mood and Affect: Mood normal.        Behavior: Behavior normal.     ED Results / Procedures / Treatments   Labs (all labs ordered are listed, but only abnormal results are displayed) Labs Reviewed  COMPREHENSIVE METABOLIC PANEL - Abnormal; Notable for the following components:      Result Value   Sodium 124 (*)    Chloride 75 (*)    CO2 44 (*)    Glucose, Bld 112 (*)    Total Protein 6.2 (*)    GFR, Estimated 57 (*)    All other components within normal limits  CBC WITH DIFFERENTIAL/PLATELET - Abnormal; Notable for the following components:   RBC 3.43 (*)    Hemoglobin 9.9 (*)    HCT 32.3 (*)    Monocytes Absolute 1.7 (*)    Abs Immature Granulocytes 0.16 (*)    All other components within normal limits  URINALYSIS, W/  REFLEX TO CULTURE (INFECTION SUSPECTED) - Abnormal; Notable for the following components:   APPearance HAZY (*)    Hgb urine dipstick LARGE (*)    Protein, ur TRACE (*)    Leukocytes,Ua LARGE (*)    Bacteria, UA RARE (*)    All other components  within normal limits  CULTURE, BLOOD (ROUTINE X 2)  CULTURE, BLOOD (ROUTINE X 2)  URINE CULTURE  LACTIC ACID, PLASMA  PROTIME-INR    EKG EKG Interpretation Date/Time:  Friday September 10 2022 11:22:48 EDT Ventricular Rate:  92 PR Interval:    QRS Duration:  106 QT Interval:  325 QTC Calculation: 402 R Axis:   77  Text Interpretation: Atrial flutter/fibrillation Repol abnrm, severe global ischemia (LM/MVD) Interpretation limited secondary to artifact Confirmed by Vanetta Mulders [phone]) on 09/10/2022 12:26:33 PM  Radiology DG Hip Unilat W or Wo Pelvis 2-3 Views Left  Result Date: 09/10/2022 CLINICAL DATA:  bruising EXAM: DG HIP (WITH OR WITHOUT PELVIS) 2-3V LEFT; LEFT FEMUR 1 VIEW COMPARISON:  CT scan abdomen and pelvis from 08/01/2022. FINDINGS: No acute fracture or dislocation. No aggressive osseous lesion. Visualized sacral arcuate lines are unremarkable. Unremarkable sacro-iliac joint. There are moderate to severe degenerative changes of the left hip joint in the form of reduced joint space, subchondral sclerosis / cystic changes and osteophytosis. There are mild degenerative changes of the right hip joint with mild joint space narrowing. Osteophytosis of the superior acetabulum. No radiopaque foreign bodies. IMPRESSION: 1. No acute osseous abnormality of the pelvis, left hip or left femur. 2. Moderate to severe left and mild right hip osteoarthritis. Electronically Signed   By: Jules Schick M.D.   On: 09/10/2022 13:40   DG Femur 1 View Left  Result Date: 09/10/2022 CLINICAL DATA:  bruising EXAM: DG HIP (WITH OR WITHOUT PELVIS) 2-3V LEFT; LEFT FEMUR 1 VIEW COMPARISON:  CT scan abdomen and pelvis from 08/01/2022. FINDINGS: No acute fracture or  dislocation. No aggressive osseous lesion. Visualized sacral arcuate lines are unremarkable. Unremarkable sacro-iliac joint. There are moderate to severe degenerative changes of the left hip joint in the form of reduced joint space, subchondral sclerosis / cystic changes and osteophytosis. There are mild degenerative changes of the right hip joint with mild joint space narrowing. Osteophytosis of the superior acetabulum. No radiopaque foreign bodies. IMPRESSION: 1. No acute osseous abnormality of the pelvis, left hip or left femur. 2. Moderate to severe left and mild right hip osteoarthritis. Electronically Signed   By: Jules Schick M.D.   On: 09/10/2022 13:40   DG Chest Port 1 View  Result Date: 09/10/2022 CLINICAL DATA:  Sepsis EXAM: PORTABLE CHEST 1 VIEW COMPARISON:  CXR 08/11/22 FINDINGS: No pleural effusion. No pneumothorax. No focal opacity. Normal cardiac and mediastinal contours. No radiographically apparent displaced rib fractures. Visualized upper abdomen unremarkable. IMPRESSION: No focal airspace opacity Electronically Signed   By: Lorenza Cambridge M.D.   On: 09/10/2022 12:29    Procedures .Critical Care  Performed by: Mannie Stabile, PA-C Authorized by: Mannie Stabile, PA-C   Critical care provider statement:    Critical care time (minutes):  31   Critical care was necessary to treat or prevent imminent or life-threatening deterioration of the following conditions:  Dehydration and sepsis   Critical care was time spent personally by me on the following activities:  Development of treatment plan with patient or surrogate, discussions with consultants, evaluation of patient's response to treatment, examination of patient, ordering and review of laboratory studies, ordering and review of radiographic studies, ordering and performing treatments and interventions, pulse oximetry, re-evaluation of patient's condition and review of old charts   I assumed direction of critical care for this  patient from another provider in my specialty: no     Care discussed with: admitting provider  Medications Ordered in ED Medications  cefTAZidime (FORTAZ) 2 g in sodium chloride 0.9 % 100 mL IVPB (has no administration in time range)  lactated ringers bolus 500 mL (0 mLs Intravenous Stopped 09/10/22 1205)  cefTAZidime (FORTAZ) 2 g in sodium chloride 0.9 % 100 mL IVPB (0 g Intravenous Stopped 09/10/22 1236)    ED Course/ Medical Decision Making/ A&P Clinical Course as of 09/10/22 1826  Fri Sep 10, 2022  1203 Urine culture report reviewed with PCP. Discussed with pharmacy who will place order for antibiotics. [CA]  1217 WBC: 10.5 [CA]  1218 Hgb urine dipstick(!): LARGE [CA]  1218 Leukocytes,Ua(!): LARGE [CA]  1218 WBC, UA: >50 [CA]    Clinical Course User Index [CA] Mannie Stabile, PA-C                                 Medical Decision Making Amount and/or Complexity of Data Reviewed Independent Historian: spouse Labs: ordered. Decision-making details documented in ED Course. Radiology: ordered and independent interpretation performed. Decision-making details documented in ED Course.  Risk Decision regarding hospitalization.   This patient presents to the ED for concern of confusion, this involves an extensive number of treatment options, and is a complaint that carries with it a high risk of complications and morbidity.  The differential diagnosis includes UTI, metabolic derangement, CVA, PNA, etc  89 year old male with history of bladder cancer with Foley catheter in place who presents to the ED due to positive urine culture and resistance to all oral antibiotics.  Per wife and daughter at bedside patient has been more confused for the past 2 days.  No fever.  Upon arrival patient tachycardic and tachypneic.  History of COPD.  Patient in no acute distress.  Patient somnolent during the exam however, opens eyes to voice.  Able to answer some questions.  Moving all 4  extremities without difficulty.  Abdomen soft, nondistended, nontender.  Sepsis labs ordered.  Chest x-ray to rule out evidence of pneumonia.  Awaiting urine culture results from daughter to start IV antibiotics.  IV fluids started. Discussed with Dr. Deretha Emory who agrees with assessment and plan.  Patient also has ecchymosis to left hip and femur.  No bony tenderness.  Full range of motion.  X-rays ordered to rule out bony fractures.  Family denies any recent falls.  CBC reassuring.  No leukocytosis.  Hemoglobin at 9.9 which appears to be around patient's baseline.  CBC significant for hyponatremia 124 likely secondary to decreased po intake. IV fluids given.  Normal renal function.  UA significant for large leukocytes greater than 50 white blood cells and bacteria.  Lactic acid normal.  Chest x-ray personally reviewed and interpreted which negative for signs of pneumonia. X-rays negative for hip/femur fracture.   IV antibiotics started per pharmacy for UTI.  1:04 PM Discussed with Dr. Erenest Blank who agrees to admit patient.   Lives at home Hx chronic UTIs  Discussed with Dr. Deretha Emory who agrees with assessment and plan.       Final Clinical Impression(s) / ED Diagnoses Final diagnoses:  Acute cystitis with hematuria  Hyponatremia    Rx / DC Orders ED Discharge Orders     None         Mannie Stabile, PA-C 09/10/22 1826    Vanetta Mulders, MD 09/11/22 1455

## 2022-09-10 NOTE — Progress Notes (Signed)
PHARMACY NOTE:  ANTIMICROBIAL RENAL DOSAGE ADJUSTMENT  Current antimicrobial regimen includes a mismatch between antimicrobial dosage and estimated renal function.  As per policy approved by the Pharmacy & Therapeutics and Medical Executive Committees, the antimicrobial dosage will be adjusted accordingly.  Current antimicrobial dosage:  Fortaz 1 g IV q8h  Indication: UTI  Renal Function:  Estimated Creatinine Clearance: 30.9 mL/min (by C-G formula based on SCr of 1.21 mg/dL). []      On intermittent HD, scheduled: []      On CRRT    Antimicrobial dosage has been changed to:  Fortaz 1 g IV q12h  Additional comments:   Thank you for allowing pharmacy to be a part of this patient's care.  Lynden Ang, [facility] 09/10/2022 8:50 PM

## 2022-09-11 DIAGNOSIS — N390 Urinary tract infection, site not specified: Secondary | ICD-10-CM | POA: Diagnosis not present

## 2022-09-11 LAB — SODIUM: Sodium: 125 mmol/L — ABNORMAL LOW (ref 135–145)

## 2022-09-11 MED ORDER — ENOXAPARIN SODIUM 30 MG/0.3ML IJ SOSY
30.0000 mg | PREFILLED_SYRINGE | INTRAMUSCULAR | Status: DC
  Filled 2022-09-11 (×3): qty 0.3

## 2022-09-11 MED ORDER — MELATONIN 5 MG PO TABS
5.0000 mg | ORAL_TABLET | Freq: Once | ORAL | Status: AC
  Administered 2022-09-11: 5 mg via ORAL
  Filled 2022-09-11: qty 1

## 2022-09-11 MED ORDER — METOPROLOL SUCCINATE ER 50 MG PO TB24
25.0000 mg | ORAL_TABLET | Freq: Every day | ORAL | Status: DC
  Administered 2022-09-11 – 2022-09-13 (×3): 25 mg via ORAL
  Filled 2022-09-11 (×3): qty 1

## 2022-09-11 MED ORDER — CHLORHEXIDINE GLUCONATE CLOTH 2 % EX PADS
6.0000 | MEDICATED_PAD | Freq: Every day | CUTANEOUS | Status: DC
  Administered 2022-09-11 – 2022-09-13 (×2): 6 via TOPICAL

## 2022-09-11 MED ORDER — METOPROLOL TARTRATE 5 MG/5ML IV SOLN
5.0000 mg | INTRAVENOUS | Status: DC | PRN
  Administered 2022-09-11: 5 mg via INTRAVENOUS
  Filled 2022-09-11: qty 5

## 2022-09-11 MED ORDER — HALOPERIDOL LACTATE 5 MG/ML IJ SOLN
1.0000 mg | Freq: Once | INTRAMUSCULAR | Status: AC
  Administered 2022-09-11: 1 mg via INTRAVENOUS
  Filled 2022-09-11: qty 1

## 2022-09-11 MED ORDER — ESCITALOPRAM OXALATE 10 MG PO TABS
10.0000 mg | ORAL_TABLET | Freq: Every day | ORAL | Status: DC
  Administered 2022-09-11 – 2022-09-12 (×2): 10 mg via ORAL
  Filled 2022-09-11 (×2): qty 1

## 2022-09-11 NOTE — Evaluation (Signed)
Clinical/Bedside Swallow Evaluation Patient Details  Name: Gerald Valdez MRN: [id number] Date of Birth: 1928-10-22  Today's Date: 09/11/2022 Time: SLP Start Time (ACUTE ONLY): 0817 SLP Stop Time (ACUTE ONLY): 0832 SLP Time Calculation (min) (ACUTE ONLY): 15 min  Past Medical History:  Past Medical History:  Diagnosis Date   Acute duodenal ulcer with bleeding 01/30/2014   Acute post-hemorrhagic anemia 01/30/2014   Anxiety    Aortic atherosclerosis (HCC) 02/10/2020   Arthritis    Basal cell carcinoma    Cancer (HCC)    bladder   Chronic kidney disease    COPD exacerbation (HCC) 02/10/2020   Coronary artery disease    Dyspnea    Dyspnea on exertion 02/10/2020   Emphysema of lung (HCC)    Essential hypertension 02/10/2020   GERD (gastroesophageal reflux disease) 02/10/2020   GI bleed 01/30/2014   Hyperlipidemia 02/10/2020   Past Surgical History:  Past Surgical History:  Procedure Laterality Date   BLADDER SURGERY     CATARACT EXTRACTION W/ INTRAOCULAR LENS IMPLANT     CYSTOSCOPY W/ RETROGRADES Bilateral 11/20/2020   Procedure: CYSTOSCOPY WITH RETROGRADE PYELOGRAM;  Surgeon: Marcine Matar, MD;  Location: [facility];  Service: Urology;  Laterality: Bilateral;   CYSTOSCOPY W/ RETROGRADES Right 02/12/2021   Procedure: CYSTOSCOPY WITH RETROGRADE PYELOGRAM;  Surgeon: Marcine Matar, MD;  Location: [facility];  Service: Urology;  Laterality: Right;   CYSTOSCOPY W/ RETROGRADES Bilateral 06/25/2021   Procedure: CYSTOSCOPY WITH RETROGRADE PYELOGRAM;  Surgeon: Marcine Matar, MD;  Location: [facility];  Service: Urology;  Laterality: Bilateral;   ESOPHAGOGASTRODUODENOSCOPY N/A 01/30/2014   Procedure: ESOPHAGOGASTRODUODENOSCOPY (EGD);  Surgeon: Louis Meckel, MD;  Location: [facility];  Service: Endoscopy;  Laterality: N/A;   TONSILLECTOMY     TRANSURETHRAL RESECTION OF BLADDER TUMOR N/A 02/12/2021   Procedure: REPEAT TRANSURETHRAL RESECTION OF BLADDER TUMOR (TURBT);  Surgeon:  Marcine Matar, MD;  Location: [facility];  Service: Urology;  Laterality: N/A;   TRANSURETHRAL RESECTION OF BLADDER TUMOR WITH MITOMYCIN-C N/A 11/20/2020   Procedure: TRANSURETHRAL RESECTION OF BLADDER TUMOR;  Surgeon: Marcine Matar, MD;  Location: [facility];  Service: Urology;  Laterality: N/A;   TRANSURETHRAL RESECTION OF BLADDER TUMOR WITH MITOMYCIN-C N/A 06/25/2021   Procedure: TRANSURETHRAL RESECTION OF BLADDER TUMOR WITH GEMCITABINE;  Surgeon: Marcine Matar, MD;  Location: [facility];  Service: Urology;  Laterality: N/A;  1 HR   TRANSURETHRAL RESECTION OF BLADDER TUMOR WITH MITOMYCIN-C N/A 12/10/2021   Procedure: TRANSURETHRAL RESECTION OF BLADDER TUMOR WITH GEMCITABINE;  Surgeon: Marcine Matar, MD;  Location: [facility];  Service: Urology;  Laterality: N/A;  1 HR   TRANSURETHRAL RESECTION OF BLADDER TUMOR WITH MITOMYCIN-C N/A 05/27/2022   Procedure: TRANSURETHRAL RESECTION OF BLADDER TUMOR;  Surgeon: Marcine Matar, MD;  Location: [facility];  Service: Urology;  Laterality: N/A;   HPI:  89 y.o. male with medical history significant of of bladder cancer on  chronic Foley, chronic hypoxic and hypercapnic respiratory failure due to COPD on 3 L oxygen at home, congestive heart failure reduced EF 40 to 45%, hypertension, hyperlipidemia, chronic anemia, CKD stage IIIa and atrial fibrillation not on Eliquis due to recurrent hematuria presented to emergency department referred by primary care provider due to increased confusion and UA and urine culture concern for UTI.    Assessment / Plan / Recommendation  Clinical Impression  Pt presents with a grossly functional oropharyngeal swallow. Pt with hx of cognitive deficits, likely exacerbated by current UTI. His spouse was at bedside and noted he consumes regular diet  with thin liquids at home. She notes his appetite continues to fade. Pt with some mild oral discoordination in setting of cognitive deficits including increased mastication time,  prolonged oral transit. Pt also with suspected delay in swallow initiation per palpation. No overt s/sx of aspiratin were exhibited with any PO. Vocal quality remained clear. Intermittent belching exhibited, consistent with esophageal dysphagia. Recommend regular thin liquid diet with meds as tolerated and full supervision with POs in setting of cognitive deficits. No further ST needs identified.  SLP Visit Diagnosis: Dysphagia, unspecified (R13.10)    Aspiration Risk  Mild aspiration risk    Diet Recommendation   Thin;Age appropriate regular  Medication Administration: Whole meds with liquid    Other  Recommendations Oral Care Recommendations: Oral care BID    Recommendations for follow up therapy are one component of a multi-disciplinary discharge planning process, led by the attending physician.  Recommendations may be updated based on patient status, additional functional criteria and insurance authorization.  Follow up Recommendations Follow physician's recommendations for discharge plan and follow up therapies      Assistance Recommended at Discharge    Functional Status Assessment Patient has not had a recent decline in their functional status  Frequency and Duration            Prognosis        Swallow Study   General Date of Onset: 09/10/22 HPI: 89 y.o. male with medical history significant of of bladder cancer on  chronic Foley, chronic hypoxic and hypercapnic respiratory failure due to COPD on 3 L oxygen at home, congestive heart failure reduced EF 40 to 45%, hypertension, hyperlipidemia, chronic anemia, CKD stage IIIa and atrial fibrillation not on Eliquis due to recurrent hematuria presented to emergency department referred by primary care provider due to increased confusion and UA and urine culture concern for UTI. Type of Study: Bedside Swallow Evaluation Previous Swallow Assessment: 08/03/22; reg thin diet recommendations Diet Prior to this Study: NPO Temperature  Spikes Noted: No Respiratory Status: Room air History of Recent Intubation: No Behavior/Cognition: Alert;Requires cueing;Distractible;Confused Oral Cavity Assessment: Dry Oral Care Completed by SLP: Yes Oral Cavity - Dentition: Dentures, top (partials bottom, some natural lower dentition in poor condition) Vision: Functional for self-feeding Self-Feeding Abilities: Needs assist Patient Positioning: Upright in bed Baseline Vocal Quality: Low vocal intensity Volitional Cough: Cognitively unable to elicit Volitional Swallow: Unable to elicit    Oral/Motor/Sensory Function Overall Oral Motor/Sensory Function: Generalized oral weakness   Ice Chips Ice chips: Impaired Presentation: Spoon Oral Phase Impairments: Reduced labial seal;Reduced lingual movement/coordination Pharyngeal Phase Impairments: Suspected delayed Swallow   Thin Liquid Thin Liquid: Impaired Presentation: Cup;Straw Oral Phase Impairments: Reduced lingual movement/coordination Oral Phase Functional Implications: Prolonged oral transit Pharyngeal  Phase Impairments: Suspected delayed Swallow;Multiple swallows    Nectar Thick Nectar Thick Liquid: Not tested   Honey Thick Honey Thick Liquid: Not tested   Puree Puree: Within functional limits   Solid     Solid: Impaired Presentation: Spoon Oral Phase Impairments: Impaired mastication;Reduced lingual movement/coordination Oral Phase Functional Implications: Prolonged oral transit Pharyngeal Phase Impairments: Suspected delayed Swallow;Multiple swallows      H. MA, CCC-SLP Acute Rehabilitation Services   09/11/2022,9:17 AM

## 2022-09-11 NOTE — Consult Note (Signed)
WOC Nurse Consult Note: Reason for Consult:Right LE skin tear, full thickness.Guidance is provided per the standing order skin care order set with additional guidance for PI prevention provided. Visit is performed remotely after review of EMR. Wound type:trauma Pressure Injury POA: N/A Measurement:Per Nursing Flow Sheet,  Wound bed:7cm x 4cm x 0.1cm Drainage (amount, consistency, odor) scant, serous Periwound:intact Dressing procedure/placement/frequency:I have provided guidance for care via the orders using a daily cleanse followed by covering the skin injury with antimicrobial nonadherent (xeroform) gauze topped with ABD pad and secured with a few turns of Kerlix roll gauze/paper tape. Heels are to be floated by way of Prevalon boots. A pressure redistribution chair pad is to be used when patient is OOB in chair and may be sent home with patient at time of discharge. Turning and repositioning to minimize time in the supine position is recommended. A sacral foam is to be placed for PI prevention.  WOC nursing team will not follow, but will remain available to this patient, the nursing and medical teams.  Please re-consult if needed.  Thank you for inviting [place] to participate in this patient's Plan of Care.  Ladona Mow, MSN, RN, CNS, GNP, Leda Min, [employer], [employer] phone:  [phone]

## 2022-09-11 NOTE — Plan of Care (Signed)
  Problem: Safety: Goal: Ability to remain free from injury will improve Outcome: Progressing   Problem: Skin Integrity: Goal: Risk for impaired skin integrity will decrease Outcome: Progressing   

## 2022-09-11 NOTE — Progress Notes (Signed)
PROGRESS NOTE    Gerald Valdez  IRJ:[id number] DOB: 1928/10/13 DOA: 09/10/2022 PCP: [name], MD    Brief Narrative:  89 year old with history of bladder cancer currently with chronic Foley catheter, chronic hypoxemic and hypercapnic respiratory failure due to COPD on 3 L oxygen at home, combined systolic and diastolic heart failure with known ejection fraction 40 to 45%, hypertension, hyperlipidemia, CKD stage IIIa and atrial fibrillation not on Eliquis due to recurrent hematuria sent to the emergency room from primary care physician's office due to increased confusion, urine culture concerning for multiple drug-resistant Pseudomonas UTI.  Recent hospitalization for volume overload requiring BiPAP.  Catheter was exchanged a week ago.  Cultures drawn from catheter.  Does have history of C. difficile in the past.  Patient hemodynamically stable, on 3 L oxygen in the emergency room.  Sodium 124 with recent normal sodium.  Repeat urine cultures and blood cultures were drawn patient was started on antibiotics and admitted to the hospital.   Assessment & Plan:   Complicated UTI, acute UTI present on admission secondary to indwelling Foley catheter. Catheter exchanged. Urine cultures with Pseudomonas putida  Resistant to fluoroquinolone and carbapenems.  Currently on ceftazidime.  Will wait for repeat cultures and discussed with ID about long-term management.  Acute on chronic hyponatremia, hypovolemic hyponatremia.  Currently with evidence of dehydration due to low sodium and chloride levels.  On gentle isotonic fluid.  Will continue normal saline 75 mill per hour today.  Sodium levels are gradually improving.  Holding diuretics.  Recheck every 12 hours.  Acute metabolic encephalopathy with history of cognitive decline and recent multiple medical problems: Likely secondary to UTI and hyponatremia. Avoid sedation.  Avoid narcotics.  Speech, PT and OT.  Chronic medical issues  including Essential hypertension, blood pressure stable.  Holding Lasix.  Half dose of Toprol-XL. Chronic hypoxemic failure on 3 L oxygen.  Pulm fairly stable.  On bronchodilators and Anoro. Paroxysmal A-fib: Taken off anticoagulation which is appropriate.  Rate controlled on Toprol-XL. Generalized anxiety disorder: On Lexapro.   DVT prophylaxis: enoxaparin (LOVENOX) injection 30 mg Start: 09/11/22 1000 SCDs Start: 09/10/22 2029   Code Status: DNR Family Communication: Wife at the bedside Disposition Plan: Status is: Inpatient Remains inpatient appropriate because: Severe systemic illness, IV antibiotics     Consultants:  None  Procedures:  None  Antimicrobials:  Ceftazidime 8/2---   Subjective: Patient seen and examined.  Wife at the bedside.  Patient is pleasant to conversation and occasionally impulsive and not answering appropriately.  No other overnight events.  He is reaching out for objects at times.  He is able to be reoriented.  Afebrile overnight.  Catheter exchanged overnight.  Objective: Vitals:   09/11/22 0329 09/11/22 0336 09/11/22 0500 09/11/22 1200  BP: 125/78 (!) 107/49  (!) 107/53  Pulse: (!) 142 93  92  Resp: (!) 21     Temp:    97.9 F (36.6 C)  TempSrc:    Oral  SpO2: 97% 100%  100%  Weight:   58.2 kg   Height:        Intake/Output Summary (Last 24 hours) at 09/11/2022 1201 Last data filed at 09/11/2022 0600 Gross per 24 hour  Intake 1065.92 ml  Output 200 ml  Net 865.92 ml   Filed Weights   09/10/22 1100 09/11/22 0500  Weight: 54.9 kg 58.2 kg    Examination:  General: Frail and debilitated.  He is alert and awake.  He is oriented to himself and family.  Cardiovascular: S1-S2 normal. Respiratory: Bilateral clear.  Occasional conducted upper airway sounds.  On 2 L oxygen.  Looks comfortable on breathing. Gastrointestinal: Soft.  Nontender.  Bowel sound present. Ext: No swelling or edema.  No cyanosis. Multiple skin tears as  documented.    Data Reviewed: I have personally reviewed following labs and imaging studies  CBC: Recent Labs  Lab 09/10/22 1124 09/11/22 0525  WBC 10.5 9.3  NEUTROABS 7.4  --   HGB 9.9* 8.6*  HCT 32.3* 28.2*  MCV 94.2 97.6  PLT 231 201   Basic Metabolic Panel: Recent Labs  Lab 09/10/22 1124 09/10/22 2046 09/11/22 0045 09/11/22 0523 09/11/22 0525 09/11/22 0854  NA 124* 125* 124* 125* 125* 127*  K 4.1  --   --   --  3.5  --   CL 75*  --   --   --  77*  --   CO2 44*  --   --   --  42*  --   GLUCOSE 112*  --   --   --  90  --   BUN 15  --   --   --  15  --   CREATININE 1.21  --   --   --  1.21  --   CALCIUM 9.2  --   --   --  8.4*  --    GFR: Estimated Creatinine Clearance: 32.7 mL/min (by C-G formula based on SCr of 1.21 mg/dL). Liver Function Tests: Recent Labs  Lab 09/10/22 1124 09/11/22 0525  AST 33 27  ALT 31 29  ALKPHOS 50 41  BILITOT 0.6 0.8  PROT 6.2* 5.2*  ALBUMIN 3.8 2.8*   No results for input(s): "LIPASE", "AMYLASE" in the last 168 hours. No results for input(s): "AMMONIA" in the last 168 hours. Coagulation Profile: Recent Labs  Lab 09/10/22 1124  INR 1.0   Cardiac Enzymes: No results for input(s): "CKTOTAL", "CKMB", "CKMBINDEX", "TROPONINI" in the last 168 hours. BNP (last 3 results) No results for input(s): "PROBNP" in the last 8760 hours. HbA1C: No results for input(s): "HGBA1C" in the last 72 hours. CBG: No results for input(s): "GLUCAP" in the last 168 hours. Lipid Profile: No results for input(s): "CHOL", "HDL", "LDLCALC", "TRIG", "CHOLHDL", "LDLDIRECT" in the last 72 hours. Thyroid Function Tests: No results for input(s): "TSH", "T4TOTAL", "FREET4", "T3FREE", "THYROIDAB" in the last 72 hours. Anemia Panel: No results for input(s): "VITAMINB12", "FOLATE", "FERRITIN", "TIBC", "IRON", "RETICCTPCT" in the last 72 hours. Sepsis Labs: Recent Labs  Lab 09/10/22 1124  LATICACIDVEN 1.3    Recent Results (from the past 240 hour(s))   Culture, blood (Routine x 2)     Status: None (Preliminary result)   Collection Time: 09/10/22 11:24 AM   Specimen: BLOOD LEFT ARM  Result Value Ref Range Status   Specimen Description   Final    BLOOD LEFT ARM Performed at [facility], [address] [place], [place] [address]    Special Requests   Final    BOTTLES DRAWN AEROBIC AND ANAEROBIC Blood Culture adequate volume Performed at [facility], [address] [place], [place] [address]    Culture   Final    NO GROWTH < 24 HOURS Performed at [facility], [address], [place], [place] [address]    Report Status PENDING  Incomplete  Culture, blood (Routine x 2)     Status: None (Preliminary result)   Collection Time: 09/10/22 11:26 AM   Specimen: BLOOD RIGHT  ARM  Result Value Ref Range Status   Specimen Description   Final    BLOOD RIGHT ARM Performed at [facility], [address] [place], [place] [address]    Special Requests   Final    BOTTLES DRAWN AEROBIC AND ANAEROBIC Blood Culture results may not be optimal due to an excessive volume of blood received in culture bottles Performed at [facility], [address], [place], [place] [address]    Culture   Final    NO GROWTH < 24 HOURS Performed at [facility], [address] [place], [place] [address]    Report Status PENDING  Incomplete  Urine Culture     Status: Abnormal (Preliminary result)   Collection Time: 09/10/22 11:45 AM   Specimen: Urine, Random  Result Value Ref Range Status   Specimen Description   Final    URINE, RANDOM Performed at [facility], [address], [place], [place] [address]    Special Requests   Final    NONE Reflexed from [phone] Performed at [facility], [address] [place], [place] [address]    Culture (A)  Final    >=100,000 COLONIES/mL GRAM NEGATIVE RODS CULTURE REINCUBATED FOR BETTER GROWTH Performed at [facility], [address] [place], [place] [address]    Report Status PENDING  Incomplete         Radiology Studies: DG Hip Unilat W or Wo Pelvis 2-3 Views Left  Result Date: 09/10/2022 CLINICAL DATA:  bruising EXAM: DG HIP (WITH OR WITHOUT PELVIS) 2-3V LEFT; LEFT FEMUR 1 VIEW COMPARISON:  CT scan abdomen and pelvis from 08/01/2022. FINDINGS: No acute fracture or dislocation. No aggressive osseous lesion. Visualized sacral arcuate lines are unremarkable. Unremarkable sacro-iliac joint. There are moderate to severe degenerative changes of the left hip joint in the form of reduced joint space, subchondral sclerosis / cystic changes and osteophytosis. There are mild degenerative changes of the right hip joint with mild joint space narrowing. Osteophytosis of the superior acetabulum. No radiopaque foreign bodies. IMPRESSION: 1. No acute osseous abnormality of the pelvis, left hip or left femur. 2. Moderate to severe left and mild right hip osteoarthritis. Electronically Signed   By: Jules Schick M.D.   On: 09/10/2022 13:40   DG Femur 1 View Left  Result Date: 09/10/2022 CLINICAL DATA:  bruising EXAM: DG HIP (WITH OR WITHOUT PELVIS) 2-3V LEFT; LEFT FEMUR 1 VIEW COMPARISON:  CT scan abdomen and pelvis from 08/01/2022. FINDINGS: No acute fracture or dislocation. No aggressive osseous lesion. Visualized sacral arcuate lines are unremarkable. Unremarkable sacro-iliac joint. There are moderate to severe degenerative changes of the left hip joint in the form of reduced joint space, subchondral sclerosis / cystic changes and osteophytosis. There are mild degenerative changes of the right hip joint with mild joint space narrowing. Osteophytosis of the superior acetabulum. No radiopaque foreign bodies. IMPRESSION: 1. No acute osseous abnormality of the pelvis, left hip or left femur. 2. Moderate to severe left and mild right hip osteoarthritis. Electronically Signed   By: Jules Schick M.D.   On: 09/10/2022  13:40   DG Chest Port 1 View  Result Date: 09/10/2022 CLINICAL DATA:  Sepsis EXAM: PORTABLE CHEST 1 VIEW COMPARISON:  CXR 08/11/22 FINDINGS: No pleural effusion. No pneumothorax. No focal opacity. Normal cardiac and mediastinal contours. No radiographically apparent displaced rib fractures. Visualized upper abdomen unremarkable. IMPRESSION: No focal airspace opacity Electronically Signed   By: Lorenza Cambridge  M.D.   On: 09/10/2022 12:29        Scheduled Meds:  atorvastatin  40 mg Oral Daily   Chlorhexidine Gluconate Cloth  6 each Topical Daily   enoxaparin (LOVENOX) injection  30 mg Subcutaneous Q24H   escitalopram  10 mg Oral QHS   feeding supplement  237 mL Oral BID BM   loratadine  10 mg Oral Daily   metoprolol succinate  25 mg Oral Daily   multivitamin with minerals  1 tablet Oral Q breakfast   pantoprazole  40 mg Oral BID AC   sodium chloride flush  3 mL Intravenous Q12H   tamsulosin  0.4 mg Oral Daily   umeclidinium-vilanterol  1 puff Inhalation Daily   Continuous Infusions:  sodium chloride     sodium chloride 75 mL/hr at 09/11/22 0310   cefTAZidime (FORTAZ)  IV 1 g (09/11/22 0957)     LOS: 1 day    Time spent: 35 minutes    Dorcas Carrow, MD [facility]

## 2022-09-11 NOTE — Plan of Care (Signed)
°  Problem: Education: °Goal: Knowledge of General Education information will improve °Description: Including pain rating scale, medication(s)/side effects and non-pharmacologic comfort measures °Outcome: Progressing °  °Problem: Clinical Measurements: °Goal: Will remain free from infection °Outcome: Progressing °Goal: Diagnostic test results will improve °Outcome: Progressing °  °Problem: Coping: °Goal: Level of anxiety will decrease °Outcome: Progressing °  °Problem: Pain Managment: °Goal: General experience of comfort will improve °Outcome: Progressing °  °

## 2022-09-12 DIAGNOSIS — N390 Urinary tract infection, site not specified: Secondary | ICD-10-CM | POA: Diagnosis not present

## 2022-09-12 LAB — SODIUM: Sodium: 129 mmol/L — ABNORMAL LOW (ref 135–145)

## 2022-09-12 NOTE — Progress Notes (Deleted)
Office Visit    Patient Name: Gerald Valdez Date of Encounter: 09/12/2022  Primary Care Provider:  [name], MD Primary Cardiologist:  Kristeen Miss, MD Primary Electrophysiologist: None   Past Medical History    Past Medical History:  Diagnosis Date   Acute duodenal ulcer with bleeding 01/30/2014   Acute post-hemorrhagic anemia 01/30/2014   Anxiety    Aortic atherosclerosis (HCC) 02/10/2020   Arthritis    Basal cell carcinoma    Cancer (HCC)    bladder   Chronic kidney disease    COPD exacerbation (HCC) 02/10/2020   Coronary artery disease    Dyspnea    Dyspnea on exertion 02/10/2020   Emphysema of lung (HCC)    Essential hypertension 02/10/2020   GERD (gastroesophageal reflux disease) 02/10/2020   GI bleed 01/30/2014   Hyperlipidemia 02/10/2020   Past Surgical History:  Procedure Laterality Date   BLADDER SURGERY     CATARACT EXTRACTION W/ INTRAOCULAR LENS IMPLANT     CYSTOSCOPY W/ RETROGRADES Bilateral 11/20/2020   Procedure: CYSTOSCOPY WITH RETROGRADE PYELOGRAM;  Surgeon: Marcine Matar, MD;  Location: [facility];  Service: Urology;  Laterality: Bilateral;   CYSTOSCOPY W/ RETROGRADES Right 02/12/2021   Procedure: CYSTOSCOPY WITH RETROGRADE PYELOGRAM;  Surgeon: Marcine Matar, MD;  Location: [facility];  Service: Urology;  Laterality: Right;   CYSTOSCOPY W/ RETROGRADES Bilateral 06/25/2021   Procedure: CYSTOSCOPY WITH RETROGRADE PYELOGRAM;  Surgeon: Marcine Matar, MD;  Location: [facility];  Service: Urology;  Laterality: Bilateral;   ESOPHAGOGASTRODUODENOSCOPY N/A 01/30/2014   Procedure: ESOPHAGOGASTRODUODENOSCOPY (EGD);  Surgeon: Louis Meckel, MD;  Location: [facility];  Service: Endoscopy;  Laterality: N/A;   TONSILLECTOMY     TRANSURETHRAL RESECTION OF BLADDER TUMOR N/A 02/12/2021   Procedure: REPEAT TRANSURETHRAL RESECTION OF BLADDER TUMOR (TURBT);  Surgeon: Marcine Matar, MD;  Location: [facility];  Service: Urology;  Laterality: N/A;    TRANSURETHRAL RESECTION OF BLADDER TUMOR WITH MITOMYCIN-C N/A 11/20/2020   Procedure: TRANSURETHRAL RESECTION OF BLADDER TUMOR;  Surgeon: Marcine Matar, MD;  Location: [facility];  Service: Urology;  Laterality: N/A;   TRANSURETHRAL RESECTION OF BLADDER TUMOR WITH MITOMYCIN-C N/A 06/25/2021   Procedure: TRANSURETHRAL RESECTION OF BLADDER TUMOR WITH GEMCITABINE;  Surgeon: Marcine Matar, MD;  Location: [facility];  Service: Urology;  Laterality: N/A;  1 HR   TRANSURETHRAL RESECTION OF BLADDER TUMOR WITH MITOMYCIN-C N/A 12/10/2021   Procedure: TRANSURETHRAL RESECTION OF BLADDER TUMOR WITH GEMCITABINE;  Surgeon: Marcine Matar, MD;  Location: [facility];  Service: Urology;  Laterality: N/A;  1 HR   TRANSURETHRAL RESECTION OF BLADDER TUMOR WITH MITOMYCIN-C N/A 05/27/2022   Procedure: TRANSURETHRAL RESECTION OF BLADDER TUMOR;  Surgeon: Marcine Matar, MD;  Location: [facility];  Service: Urology;  Laterality: N/A;    Allergies  Allergies  Allergen Reactions   Prednisone Other (See Comments)    Unknown reaction     History of Present Illness    Gerald Valdez  is a 89 year old male with a PMH of atrial flutter, coronary artery calcifications, bladder cancer, COPD, HTN, HLD, PUD GERD presents today for posthospital follow-up.   Mr. Rowser was seen initially in 02/2020 by referral by Dr. Elease Hashimoto for incidental findings of chronic calcifications CT scan.  History of COPD and is currently followed by pulmonology.  Patient was asymptomatic and further ischemic evaluation was deferred.  2D echo was completed showing EF of 50-55% with moderate calcified AV valve.  Cholesterol is currently managed by PCP patient was seen 07/21/2022 for evaluation  of atrial flutter.  He was admitted with somnolence and confusion and CO2 levels were elevated he was placed on BiPAP in the ED.  Chest x-ray showed pneumonia small pericardial effusion troponins flat 47-47. EKG showed atrial flutter.  He was treated with IV  diuretics due to peripheral edema 2D echo was completed reduced to 45 as 50%.  He was started on Lopressor 25 mg twice daily for rate control and Eliquis 2.5 mg twice daily.  He was discharged to a rehab facility and developed hematuria and Eliquis was held.  Presented back to the ED on 6/23 due to possible seizure-like episode.  EKG showed sinus rhythm and CT shows suspicion of hemorrhagic cystitis with small right pleural effusion and atelectasis.  He was admitted and treated with IV antibiotics and placed on BiPAP due to hypercarbic and hypoxic respiratory failure.  Developed atrial flutter with heart rates in the 120s to 130s and cardiology was consulted.  She was made DNR and plan was made to continue medications for improvement of quality of life and GOC.   Since last being seen in the office patient reports***.  Patient denies chest pain, palpitations, dyspnea, PND, orthopnea, nausea, vomiting, dizziness, syncope, edema, weight gain, or early satiety.     ***Notes: start amiodarone 400 mg PO BID for 10 doses then 200 mg PO  No plans for DCCV at this time- patient is unable to tolerate anticoagulation due to hematuria  Home Medications    No current facility-administered medications for this visit.   No current outpatient medications on file.   Facility-Administered Medications Ordered in Other Visits  Medication Dose Route Frequency Provider Last Rate Last Admin   0.9 %  sodium chloride infusion  250 mL Intravenous PRN Janalyn Shy, Subrina, MD       acetaminophen (TYLENOL) tablet 650 mg  650 mg Oral Q6H PRN Janalyn Shy, Subrina, MD       Or   acetaminophen (TYLENOL) suppository 650 mg  650 mg Rectal Q6H PRN Janalyn Shy, Subrina, MD       atorvastatin (LIPITOR) tablet 40 mg  40 mg Oral Daily Sundil, Subrina, MD   40 mg at 09/12/22 0956   cefTAZidime (FORTAZ) 1 g in sodium chloride 0.9 % 100 mL IVPB  1 g Intravenous Q12H Lynden Ang, RPH 200 mL/hr at 09/12/22 0959 1 g at 09/12/22 6213    Chlorhexidine Gluconate Cloth 2 % PADS 6 each  6 each Topical Daily Dorcas Carrow, MD   6 each at 09/11/22 1014   enoxaparin (LOVENOX) injection 30 mg  30 mg Subcutaneous Q24H Sundil, Subrina, MD       escitalopram (LEXAPRO) tablet 10 mg  10 mg Oral QHS Dorcas Carrow, MD   10 mg at 09/11/22 2202   feeding supplement (ENSURE ENLIVE / ENSURE PLUS) liquid 237 mL  237 mL Oral BID BM Sundil, Subrina, MD   237 mL at 09/12/22 1435   gemcitabine (GEMZAR) chemo syringe for bladder instillation 2,000 mg  2,000 mg Bladder Instillation Once Marcine Matar, MD       levalbuterol Pauline Aus) nebulizer solution 0.63 mg  0.63 mg Nebulization Q8H PRN Janalyn Shy, Subrina, MD       loratadine (CLARITIN) tablet 10 mg  10 mg Oral Daily Sundil, Subrina, MD   10 mg at 09/12/22 0956   metoprolol succinate (TOPROL-XL) 24 hr tablet 25 mg  25 mg Oral Daily Sundil, Subrina, MD   25 mg at 09/12/22 0955   metoprolol tartrate (LOPRESSOR) injection 5 mg  5  mg Intravenous Q5 min PRN Janalyn Shy, Subrina, MD   5 mg at 09/11/22 0327   multivitamin with minerals tablet 1 tablet  1 tablet Oral Q breakfast Janalyn Shy, Subrina, MD   1 tablet at 09/12/22 0956   pantoprazole (PROTONIX) EC tablet 40 mg  40 mg Oral BID AC Sundil, Subrina, MD   40 mg at 09/12/22 1191   senna-docusate (Senokot-S) tablet 1 tablet  1 tablet Oral QHS PRN Janalyn Shy, Subrina, MD       sodium chloride flush (NS) 0.9 % injection 3 mL  3 mL Intravenous Q12H Sundil, Subrina, MD   3 mL at 09/12/22 1004   sodium chloride flush (NS) 0.9 % injection 3 mL  3 mL Intravenous PRN Sundil, Subrina, MD       tamsulosin [facility]) capsule 0.4 mg  0.4 mg Oral Daily Sundil, Subrina, MD   0.4 mg at 09/12/22 4782   umeclidinium-vilanterol (ANORO ELLIPTA) 62.5-25 MCG/ACT 1 puff  1 puff Inhalation Daily Sundil, Subrina, MD   1 puff at 09/12/22 9562     Review of Systems  Please see the history of present illness.    (+)*** (+)***  All other systems reviewed and are otherwise negative except as  noted above.  Physical Exam    Wt Readings from Last 3 Encounters:  09/12/22 132 lb 7.9 oz (60.1 kg)  08/25/22 117 lb 4.8 oz (53.2 kg)  08/15/22 120 lb 9.5 oz (54.7 kg)   [id number] were no vitals filed for this visit.,There is no height or weight on file to calculate BMI.  Constitutional:      Appearance: Healthy appearance. Not in distress.  Neck:     Vascular: JVD normal.  Pulmonary:     Effort: Pulmonary effort is normal.     Breath sounds: No wheezing. No rales. Diminished in the bases Cardiovascular:     Normal rate. Regular rhythm. Normal S1. Normal S2.      Murmurs: There is no murmur.  Edema:    Peripheral edema absent.  Abdominal:     Palpations: Abdomen is soft non tender. There is no hepatomegaly.  Skin:    General: Skin is warm and dry.  Neurological:     General: No focal deficit present.     Mental Status: Alert and oriented to person, place and time.     Cranial Nerves: Cranial nerves are intact.  EKG/LABS/ Recent Cardiac Studies    ECG personally reviewed by me today - ***   Risk Assessment/Calculations:   {Does this patient have ATRIAL FIBRILLATION?:[id number]}        Lab Results  Component Value Date   WBC 9.3 09/11/2022   HGB 8.6 (L) 09/11/2022   HCT 28.2 (L) 09/11/2022   MCV 97.6 09/11/2022   PLT 201 09/11/2022   Lab Results  Component Value Date   CREATININE 1.21 09/11/2022   BUN 15 09/11/2022   NA 129 (L) 09/12/2022   K 3.5 09/11/2022   CL 77 (L) 09/11/2022   CO2 42 (H) 09/11/2022   Lab Results  Component Value Date   ALT 29 09/11/2022   AST 27 09/11/2022   ALKPHOS 41 09/11/2022   BILITOT 0.8 09/11/2022   Lab Results  Component Value Date   CHOL 158 02/10/2020   HDL 65 02/10/2020   LDLCALC 80 02/10/2020   TRIG 64 02/10/2020   CHOLHDL 2.4 02/10/2020    Lab Results  Component Value Date   HGBA1C 6.1 (H) 07/01/2021     Assessment & Plan  1.***  2.***  3.***  4.***      Disposition: Follow-up with Kristeen Miss, MD or APP in *** months {Are you ordering a CV Procedure (e.g. stress test, cath, DCCV, TEE, etc)?   Press F2        :[id number]}   Medication Adjustments/Labs and Tests Ordered: Current medicines are reviewed at length with the patient today.  Concerns regarding medicines are outlined above.   Signed, Napoleon Form, Leodis Rains, NP 09/12/2022, 2:52 PM [facility]

## 2022-09-12 NOTE — Progress Notes (Signed)
PROGRESS NOTE    Gerald Valdez  BJY:[id number] DOB: [birth year] DOA: 09/10/2022 PCP: [name], MD    Brief Narrative:  89 year old with history of bladder cancer currently with chronic Foley catheter, chronic hypoxemic and hypercapnic respiratory failure due to COPD on 3 L oxygen at home, combined systolic and diastolic heart failure with known ejection fraction 40 to 45%, hypertension, hyperlipidemia, CKD stage IIIa and atrial fibrillation not on Eliquis due to recurrent hematuria sent to the emergency room from primary care physician's office due to increased confusion, urine culture concerning for multiple drug-resistant Pseudomonas UTI.  Recent hospitalization for volume overload requiring BiPAP.  Catheter was exchanged a week ago.  Cultures drawn from catheter.  Does have history of C. difficile in the past.  Patient hemodynamically stable, on 3 L oxygen in the emergency room.  Sodium 124 with recent normal sodium.  Repeat urine cultures and blood cultures were drawn patient was started on antibiotics and admitted to the hospital. Outpatient urine culture with Pseudomonas Inpatient urine culture with strenotophomonas resistant to Bactrim and Levaquin.  Clinically improving on ceftazidime.   Assessment & Plan:   Complicated UTI, acute UTI present on admission secondary to indwelling Foley catheter. Catheter exchanged. Urine cultures with Pseudomonas putida from outpatient(scanned in media section) Blood cultures negative. Repeat urine cultures with Strenotophomonas.  Final sensitivity pending.  Resistant to Bactrim and Levaquin. Will discuss with ID about further parenteral antibiotics.  Acute on chronic hyponatremia, hypovolemic hyponatremia.   Patient was treated with normal saline.  Sodium appropriately improving.  129 today.  Discontinue further fluids.    Acute metabolic encephalopathy with history of cognitive decline and recent multiple medical problems: Likely secondary  to UTI and hyponatremia. Avoid sedation.  Avoid narcotics.  Speech, PT and OT. His mental status is improving.  Chronic medical issues including Essential hypertension, blood pressure stable.  Holding Lasix.  Half dose of Toprol-XL. Chronic hypoxemic failure on 3 L oxygen.  Pulm fairly stable.  On bronchodilators and Anoro. Paroxysmal A-fib: Taken off anticoagulation which is appropriate.  Rate controlled on Toprol-XL. Generalized anxiety disorder: On Lexapro.   DVT prophylaxis: enoxaparin (LOVENOX) injection 30 mg Start: 09/11/22 1000 SCDs Start: 09/10/22 2029   Code Status: DNR Family Communication: Wife at the bedside Disposition Plan: Status is: Inpatient Remains inpatient appropriate because: Severe systemic illness, IV antibiotics     Consultants:  ID, will consult  Procedures:  None  Antimicrobials:  Ceftazidime 8/2---   Subjective:  Patient seen and examined.  No overnight events.  Wife at the bedside.  He is more alert awake and interactive.  Eating breakfast today.  Afebrile.  Objective: Vitals:   09/11/22 2153 09/12/22 0521 09/12/22 0851 09/12/22 0955  BP: 104/62 (!) 118/54  (!) 116/58  Pulse: 93 91    Resp: (!) 22 (!) 22    Temp: (!) 97.5 F (36.4 C) (!) 97.5 F (36.4 C)    TempSrc: Oral Oral    SpO2: 100% 93% 91%   Weight:  60.1 kg    Height:        Intake/Output Summary (Last 24 hours) at 09/12/2022 1111 Last data filed at 09/12/2022 0500 Gross per 24 hour  Intake --  Output 1100 ml  Net -1100 ml   Filed Weights   09/10/22 1100 09/11/22 0500 09/12/22 0521  Weight: 54.9 kg 58.2 kg 60.1 kg    Examination:  General: Frail and debilitated.  Chronically sick looking.  Not in any distress.  On 2 L of  oxygen.  Alert awake and oriented to himself and family. Cardiovascular: S1-S2 normal. Respiratory: Bilateral clear.  Occasional conducted upper airway sounds.  On 2 L oxygen.  Looks comfortable on breathing. Gastrointestinal: Soft.  Nontender.   Bowel sound present. Ext: No swelling or edema.  No cyanosis. Multiple skin tears as documented. Foley catheter with clear urine.    Data Reviewed: I have personally reviewed following labs and imaging studies  CBC: Recent Labs  Lab 09/10/22 1124 09/11/22 0525  WBC 10.5 9.3  NEUTROABS 7.4  --   HGB 9.9* 8.6*  HCT 32.3* 28.2*  MCV 94.2 97.6  PLT 231 201   Basic Metabolic Panel: Recent Labs  Lab 09/10/22 1124 09/10/22 2046 09/11/22 0523 09/11/22 0525 09/11/22 0854 09/11/22 1804 09/12/22 0943  NA 124*   < > 125* 125* 127* 125* 129*  K 4.1  --   --  3.5  --   --   --   CL 75*  --   --  77*  --   --   --   CO2 44*  --   --  42*  --   --   --   GLUCOSE 112*  --   --  90  --   --   --   BUN 15  --   --  15  --   --   --   CREATININE 1.21  --   --  1.21  --   --   --   CALCIUM 9.2  --   --  8.4*  --   --   --    < > = values in this interval not displayed.   GFR: Estimated Creatinine Clearance: 33.8 mL/min (by C-G formula based on SCr of 1.21 mg/dL). Liver Function Tests: Recent Labs  Lab 09/10/22 1124 09/11/22 0525  AST 33 27  ALT 31 29  ALKPHOS 50 41  BILITOT 0.6 0.8  PROT 6.2* 5.2*  ALBUMIN 3.8 2.8*   No results for input(s): "LIPASE", "AMYLASE" in the last 168 hours. No results for input(s): "AMMONIA" in the last 168 hours. Coagulation Profile: Recent Labs  Lab 09/10/22 1124  INR 1.0   Cardiac Enzymes: No results for input(s): "CKTOTAL", "CKMB", "CKMBINDEX", "TROPONINI" in the last 168 hours. BNP (last 3 results) No results for input(s): "PROBNP" in the last 8760 hours. HbA1C: No results for input(s): "HGBA1C" in the last 72 hours. CBG: No results for input(s): "GLUCAP" in the last 168 hours. Lipid Profile: No results for input(s): "CHOL", "HDL", "LDLCALC", "TRIG", "CHOLHDL", "LDLDIRECT" in the last 72 hours. Thyroid Function Tests: No results for input(s): "TSH", "T4TOTAL", "FREET4", "T3FREE", "THYROIDAB" in the last 72 hours. Anemia Panel: No  results for input(s): "VITAMINB12", "FOLATE", "FERRITIN", "TIBC", "IRON", "RETICCTPCT" in the last 72 hours. Sepsis Labs: Recent Labs  Lab 09/10/22 1124  LATICACIDVEN 1.3    Recent Results (from the past 240 hour(s))  Culture, blood (Routine x 2)     Status: None (Preliminary result)   Collection Time: 09/10/22 11:24 AM   Specimen: BLOOD LEFT ARM  Result Value Ref Range Status   Specimen Description   Final    BLOOD LEFT ARM Performed at [facility], [address], [place], [place] [address]    Special Requests   Final    BOTTLES DRAWN AEROBIC AND ANAEROBIC Blood Culture adequate volume Performed at [facility], [address], [place], [place] [address]    Culture   Final  NO GROWTH 2 DAYS Performed at [facility], [address] [place], [place] [address]    Report Status PENDING  Incomplete  Culture, blood (Routine x 2)     Status: None (Preliminary result)   Collection Time: 09/10/22 11:26 AM   Specimen: BLOOD RIGHT ARM  Result Value Ref Range Status   Specimen Description   Final    BLOOD RIGHT ARM Performed at [facility], [address], [place], [place] [address]    Special Requests   Final    BOTTLES DRAWN AEROBIC AND ANAEROBIC Blood Culture results may not be optimal due to an excessive volume of blood received in culture bottles Performed at [facility], [address] [place], [place] [address]    Culture   Final    NO GROWTH 2 DAYS Performed at [facility], [address], [place], [place] [address]    Report Status PENDING  Incomplete  Urine Culture     Status: Abnormal   Collection Time: 09/10/22 11:45 AM   Specimen: Urine, Random  Result Value Ref Range Status   Specimen Description   Final    URINE, RANDOM Performed at [facility], [address] [place], [place] [address]    Special Requests   Final    NONE Reflexed from [id number] Performed at [facility], [address] [place], [place] [address]    Culture >=100,000 COLONIES/mL STENOTROPHOMONAS MALTOPHILIA (A)  Final   Report Status 09/12/2022 FINAL  Final   Organism ID, Bacteria STENOTROPHOMONAS MALTOPHILIA (A)  Final      Susceptibility   Stenotrophomonas maltophilia - MIC*    LEVOFLOXACIN >=8 RESISTANT Resistant     TRIMETH/SULFA >=320 RESISTANT Resistant     * >=100,000 COLONIES/mL STENOTROPHOMONAS MALTOPHILIA         Radiology Studies: DG Hip Unilat W or Wo Pelvis 2-3 Views Left  Result Date: 09/10/2022 CLINICAL DATA:  bruising EXAM: DG HIP (WITH OR WITHOUT PELVIS) 2-3V LEFT; LEFT FEMUR 1 VIEW COMPARISON:  CT scan abdomen and pelvis from 08/01/2022. FINDINGS: No acute fracture or dislocation. No aggressive osseous lesion. Visualized sacral arcuate lines are unremarkable. Unremarkable sacro-iliac joint. There are moderate to severe degenerative changes of the left hip joint in the form of reduced joint space, subchondral sclerosis / cystic changes and osteophytosis. There are mild degenerative changes of the right hip joint with mild joint space narrowing. Osteophytosis of the superior acetabulum. No radiopaque foreign bodies. IMPRESSION: 1. No acute osseous abnormality of the pelvis, left hip or left femur. 2. Moderate to severe left and mild right hip osteoarthritis. Electronically Signed   By: Jules Schick M.D.   On: 09/10/2022 13:40   DG Femur 1 View Left  Result Date: 09/10/2022 CLINICAL DATA:  bruising EXAM: DG HIP (WITH OR WITHOUT PELVIS) 2-3V LEFT; LEFT FEMUR 1 VIEW COMPARISON:  CT scan abdomen and pelvis from 08/01/2022. FINDINGS: No acute fracture or dislocation. No aggressive osseous lesion. Visualized sacral arcuate lines are unremarkable. Unremarkable sacro-iliac joint. There are moderate to severe degenerative changes of the left hip joint in the form of reduced joint space, subchondral sclerosis / cystic changes and osteophytosis. There are mild  degenerative changes of the right hip joint with mild joint space narrowing. Osteophytosis of the superior acetabulum. No radiopaque foreign bodies. IMPRESSION: 1. No acute osseous abnormality of the pelvis, left hip or left femur. 2. Moderate to severe left and mild right hip osteoarthritis. Electronically Signed  By: Jules Schick M.D.   On: 09/10/2022 13:40   DG Chest Port 1 View  Result Date: 09/10/2022 CLINICAL DATA:  Sepsis EXAM: PORTABLE CHEST 1 VIEW COMPARISON:  CXR 08/11/22 FINDINGS: No pleural effusion. No pneumothorax. No focal opacity. Normal cardiac and mediastinal contours. No radiographically apparent displaced rib fractures. Visualized upper abdomen unremarkable. IMPRESSION: No focal airspace opacity Electronically Signed   By: Lorenza Cambridge M.D.   On: 09/10/2022 12:29        Scheduled Meds:  atorvastatin  40 mg Oral Daily   Chlorhexidine Gluconate Cloth  6 each Topical Daily   enoxaparin (LOVENOX) injection  30 mg Subcutaneous Q24H   escitalopram  10 mg Oral QHS   feeding supplement  237 mL Oral BID BM   loratadine  10 mg Oral Daily   metoprolol succinate  25 mg Oral Daily   multivitamin with minerals  1 tablet Oral Q breakfast   pantoprazole  40 mg Oral BID AC   sodium chloride flush  3 mL Intravenous Q12H   tamsulosin  0.4 mg Oral Daily   umeclidinium-vilanterol  1 puff Inhalation Daily   Continuous Infusions:  sodium chloride     cefTAZidime (FORTAZ)  IV 1 g (09/12/22 0959)     LOS: 2 days    Time spent: 35 minutes    Dorcas Carrow, MD [facility]

## 2022-09-12 NOTE — Progress Notes (Signed)
Wife refused Lovenox. This RN explained the risk and benefit. Wife stated pt had a bleed through and they will take their chances on getting a blood clot. MD notified

## 2022-09-12 NOTE — Plan of Care (Signed)
Wife and pt HOH and

## 2022-09-13 ENCOUNTER — Ambulatory Visit: Payer: Medicare Other | Admitting: Nurse Practitioner

## 2022-09-13 DIAGNOSIS — N390 Urinary tract infection, site not specified: Secondary | ICD-10-CM | POA: Diagnosis not present

## 2022-09-13 NOTE — TOC CM/SW Note (Signed)
Transition of Care [facility]) - Inpatient Brief Assessment   Patient Details  Name: Gerald Valdez MRN: [id number] Date of Birth: 1928-10-12  [facility]) CM/SW Contact:    Howell Rucks, RN Phone Number: 09/13/2022, 12:22 PM   Clinical Narrative: Met with pt, pt's spouse and pt's dtr at bedside to introduce role of TOC/NCM and review for dc planning, pt sleeping during assessment, pt's dtr provided information, reports pt has PCP and pharmacy in place, has home DME: Rollator, walker, cane, BSC, shower chair, no mention of home care services. Dtr reports their dc plan is for pt to return home at discharge. TOC Brief Assessment completed. No TOC needs identified.     Transition of Care Asessment: Insurance and Status: Insurance coverage has been reviewed Patient has primary care physician: Yes Home environment has been reviewed: resides with spouse with family support   Prior/Current Home Services: No current home services Social Determinants of Health Reivew: SDOH reviewed no interventions necessary Readmission risk has been reviewed: Yes Transition of care needs: no transition of care needs at this time

## 2022-09-13 NOTE — Discharge Summary (Signed)
Physician Discharge Summary  Gerald Valdez JXB:[id number] DOB: 1928-12-30 DOA: 09/10/2022  PCP: [name], MD  Admit date: 09/10/2022 Discharge date: 09/13/2022  Admitted From: Home Disposition: Home  Recommendations for Outpatient Follow-up:  Follow up with PCP in 1-2 weeks Please obtain BMP/CBC in one week Will send referral to palliative care,  Home Health: Already available Equipment/Devices: Available at home  Discharge Condition: Fair CODE STATUS: DNR Diet recommendation: Regular diet, nutritional supplements,  Discharge summary: 89 year old with history of bladder cancer currently with chronic Foley catheter, chronic hypoxemic and hypercapnic respiratory failure due to COPD on 3 L oxygen at home, combined systolic and diastolic heart failure with known ejection fraction 40 to 45%, hypertension, hyperlipidemia, CKD stage IIIa and atrial fibrillation not on Eliquis due to recurrent hematuria sent to the emergency room from primary care physician's office due to increased confusion, urine culture concerning for multiple drug-resistant Pseudomonas UTI.  Recent hospitalization for volume overload requiring BiPAP.  Catheter was exchanged a week ago.  Cultures drawn from catheter.  Does have history of C. difficile in the past.  Patient hemodynamically stable, on 3 L oxygen in the emergency room.  Sodium 124 with recent normal sodium.  Repeat urine cultures and blood cultures were drawn patient was started on antibiotics and admitted to the hospital. Outpatient urine culture with Pseudomonas Inpatient urine culture with strenotophomonas resistant to Bactrim and Levaquin.   # Complicated UTI, acute UTI present on admission secondary to indwelling Foley catheter.  Suspect colonization of urinary bladder. Catheter exchanged. Urine cultures with Pseudomonas putida from outpatient(scanned in media section) Blood cultures negative. Repeat urine cultures with Strenotophomonas.  Patient  received 4 days of ceftazidime.  Seen by infectious disease, recommended not to treat but monitor.  Discontinue antibiotics.   # Acute on chronic hyponatremia, hypovolemic hyponatremia.   Patient was treated with normal saline.  Sodium appropriately improving.  Encourage regular diet, nutritional supplements.  No restriction on salt intake.   # Acute metabolic encephalopathy with history of cognitive decline and recent multiple medical problems: Likely secondary to hyponatremia. His mental status is improving.  Remains very frail and debilitated at baseline.   Chronic medical issues including Essential hypertension, blood pressures are stable.  Go back on Lasix and Toprol-XL.   Chronic hypoxemic failure on 3 L oxygen.  Pulm fairly stable.  On bronchodilators and Anoro.  Paroxysmal A-fib: Taken off anticoagulation which is appropriate.  Rate controlled on Toprol-XL.  Generalized anxiety disorder: On Lexapro.   Goal of care: Remains DNR/DNI.  Patient with very advanced disease, multiple comorbidities and very frail status.  He is appropriate for home hospice level of care.  Family agreed for palliative care referral, will send referral to [facility] care for home palliative care follow-up.  May benefit with hospice.   Discharge Diagnoses:  Principal Problem:   Complicated UTI (urinary tract infection) Active Problems:   Acute metabolic encephalopathy   Acute cystitis   Chronic systolic CHF preserved EF 40 to 45% (congestive heart failure) (HCC)   Hyponatremia   Essential hypertension   Hyperlipidemia   History of bladder cancer on chronic Foley   Chronic hypoxic respiratory failure (HCC)   Chronic anemia   Atrial fibrillation, chronic (HCC)   COPD (chronic obstructive pulmonary disease) with chronic bronchitis    Discharge Instructions  Discharge Instructions     Amb Referral to Palliative Care   Complete by: As directed    Diet general   Complete by: As directed  Discharge wound care:   Complete by: As directed    Cleanse with NS, pat dry. Cover with folded layer of xeroform gauze , top with ABD pad and secure with a few turns of Kerlix roll gauze/paper tape. Change daily.   Increase activity slowly   Complete by: As directed       Allergies as of 09/13/2022       Reactions   Prednisone Other (See Comments)   Unknown reaction        Medication List     STOP taking these medications    atorvastatin 40 MG tablet Commonly known as: LIPITOR       TAKE these medications    albuterol (2.5 MG/3ML) 0.083% nebulizer solution Commonly known as: PROVENTIL Take 3 mLs (2.5 mg total) by nebulization every 6 (six) hours as needed for wheezing or shortness of breath. Chronic hypercapneic respiratory failure and COPD J96.12, J44.9   ascorbic acid 500 MG tablet Commonly known as: VITAMIN C Take 500 mg by mouth daily.   CRANBERRY PO Take 1 tablet by mouth daily with breakfast.   escitalopram 10 MG tablet Commonly known as: LEXAPRO Take 10 mg by mouth at bedtime.   feeding supplement Liqd Take 237 mLs by mouth 2 (two) times daily between meals.   furosemide 40 MG tablet Commonly known as: LASIX Take 1 tablet (40 mg total) by mouth daily. What changed: when to take this   loratadine 10 MG tablet Commonly known as: CLARITIN Take 10 mg by mouth daily.   metoprolol succinate 25 MG 24 hr tablet Commonly known as: TOPROL-XL Take 2 tablets (50 mg total) by mouth daily. What changed:  how much to take when to take this additional instructions   multivitamin with minerals Tabs tablet Take 1 tablet by mouth daily with breakfast.   OXYGEN Inhale 3 L/min into the lungs continuous.   pantoprazole 40 MG tablet Commonly known as: PROTONIX Take 1 tablet (40 mg total) by mouth 2 (two) times daily before a meal.   polyethylene glycol 17 g packet Commonly known as: MIRALAX / GLYCOLAX Take 17 g by mouth daily as needed for mild  constipation.   potassium chloride SA 20 MEQ tablet Commonly known as: KLOR-CON M Take 1 tablet (20 mEq total) by mouth daily. What changed: when to take this   rosuvastatin 20 MG tablet Commonly known as: CRESTOR Take 20 mg by mouth daily.   saccharomyces boulardii 250 MG capsule Commonly known as: FLORASTOR Take 1 capsule (250 mg total) by mouth 2 (two) times daily.   tamsulosin 0.4 MG Caps capsule Commonly known as: FLOMAX Take 1 capsule (0.4 mg total) by mouth daily. What changed: when to take this   umeclidinium-vilanterol 62.5-25 MCG/ACT Aepb Commonly known as: ANORO ELLIPTA Inhale 1 puff into the lungs daily. What changed:  when to take this additional instructions               Discharge Care Instructions  (From admission, onward)           Start     Ordered   09/13/22 0000  Discharge wound care:       Comments: Cleanse with NS, pat dry. Cover with folded layer of xeroform gauze , top with ABD pad and secure with a few turns of Kerlix roll gauze/paper tape. Change daily.   09/13/22 1327            Allergies  Allergen Reactions   Prednisone Other (See Comments)  Unknown reaction    Consultations: Infectious disease   Procedures/Studies: DG Hip Unilat W or Wo Pelvis 2-3 Views Left  Result Date: 09/10/2022 CLINICAL DATA:  bruising EXAM: DG HIP (WITH OR WITHOUT PELVIS) 2-3V LEFT; LEFT FEMUR 1 VIEW COMPARISON:  CT scan abdomen and pelvis from 08/01/2022. FINDINGS: No acute fracture or dislocation. No aggressive osseous lesion. Visualized sacral arcuate lines are unremarkable. Unremarkable sacro-iliac joint. There are moderate to severe degenerative changes of the left hip joint in the form of reduced joint space, subchondral sclerosis / cystic changes and osteophytosis. There are mild degenerative changes of the right hip joint with mild joint space narrowing. Osteophytosis of the superior acetabulum. No radiopaque foreign bodies. IMPRESSION: 1.  No acute osseous abnormality of the pelvis, left hip or left femur. 2. Moderate to severe left and mild right hip osteoarthritis. Electronically Signed   By: Jules Schick M.D.   On: 09/10/2022 13:40   DG Femur 1 View Left  Result Date: 09/10/2022 CLINICAL DATA:  bruising EXAM: DG HIP (WITH OR WITHOUT PELVIS) 2-3V LEFT; LEFT FEMUR 1 VIEW COMPARISON:  CT scan abdomen and pelvis from 08/01/2022. FINDINGS: No acute fracture or dislocation. No aggressive osseous lesion. Visualized sacral arcuate lines are unremarkable. Unremarkable sacro-iliac joint. There are moderate to severe degenerative changes of the left hip joint in the form of reduced joint space, subchondral sclerosis / cystic changes and osteophytosis. There are mild degenerative changes of the right hip joint with mild joint space narrowing. Osteophytosis of the superior acetabulum. No radiopaque foreign bodies. IMPRESSION: 1. No acute osseous abnormality of the pelvis, left hip or left femur. 2. Moderate to severe left and mild right hip osteoarthritis. Electronically Signed   By: Jules Schick M.D.   On: 09/10/2022 13:40   DG Chest Port 1 View  Result Date: 09/10/2022 CLINICAL DATA:  Sepsis EXAM: PORTABLE CHEST 1 VIEW COMPARISON:  CXR 08/11/22 FINDINGS: No pleural effusion. No pneumothorax. No focal opacity. Normal cardiac and mediastinal contours. No radiographically apparent displaced rib fractures. Visualized upper abdomen unremarkable. IMPRESSION: No focal airspace opacity Electronically Signed   By: Lorenza Cambridge M.D.   On: 09/10/2022 12:29   (Echo, Carotid, EGD, Colonoscopy, ERCP)    Subjective: Seen and examined.  Mostly sleepy during interview.  Family at the bedside.  No overnight events.   Discharge Exam: Vitals:   09/13/22 0850 09/13/22 1327  BP:  105/62  Pulse:  68  Resp:  20  Temp:  97.9 F (36.6 C)  SpO2: 100% 100%   Vitals:   09/13/22 0432 09/13/22 0439 09/13/22 0850 09/13/22 1327  BP: 112/63   105/62  Pulse: 91    68  Resp:    20  Temp: 97.6 F (36.4 C)   97.9 F (36.6 C)  TempSrc: Oral   Oral  SpO2: 100%  100% 100%  Weight:  58.9 kg    Height:       General: Frail and debilitated.  Chronically sick looking.  Not in any distress.  Alert awake and oriented to himself and family. Cardiovascular: S1-S2 normal. Respiratory: No added sounds.  On 3 L oxygen.  Looks comfortable.   Gastrointestinal: Soft.  Nontender.  Bowel sound present. Ext: No swelling or edema.  No cyanosis. Multiple skin tears as documented. Foley catheter with amber-colored urine.     The results of significant diagnostics from this hospitalization (including imaging, microbiology, ancillary and laboratory) are listed below for reference.     Microbiology: Recent Results (from the  past 240 hour(s))  Culture, blood (Routine x 2)     Status: None (Preliminary result)   Collection Time: 09/10/22 11:24 AM   Specimen: BLOOD LEFT ARM  Result Value Ref Range Status   Specimen Description   Final    BLOOD LEFT ARM Performed at [facility], [address] [place], [place] [address]    Special Requests   Final    BOTTLES DRAWN AEROBIC AND ANAEROBIC Blood Culture adequate volume Performed at [facility], [address], [place], [place] [address]    Culture   Final    NO GROWTH 3 DAYS Performed at [facility], [address] [place], [place] [address]    Report Status PENDING  Incomplete  Culture, blood (Routine x 2)     Status: None (Preliminary result)   Collection Time: 09/10/22 11:26 AM   Specimen: BLOOD RIGHT ARM  Result Value Ref Range Status   Specimen Description   Final    BLOOD RIGHT ARM Performed at [facility], [address], [place], [place] [address]    Special Requests   Final    BOTTLES DRAWN AEROBIC AND ANAEROBIC Blood Culture results may not be optimal due to an excessive volume of blood received in culture bottles Performed at [facility],  [address], [place], [place] [address]    Culture   Final    NO GROWTH 3 DAYS Performed at [facility], [address], [place], [place] [address]    Report Status PENDING  Incomplete  Urine Culture     Status: Abnormal   Collection Time: 09/10/22 11:45 AM   Specimen: Urine, Random  Result Value Ref Range Status   Specimen Description   Final    URINE, RANDOM Performed at [facility], [address], [place], [place] [address]    Special Requests   Final    NONE Reflexed from [id number] Performed at [facility], [address] [place], [place] [address]    Culture >=100,000 COLONIES/mL STENOTROPHOMONAS MALTOPHILIA (A)  Final   Report Status 09/12/2022 FINAL  Final   Organism ID, Bacteria STENOTROPHOMONAS MALTOPHILIA (A)  Final      Susceptibility   Stenotrophomonas maltophilia - MIC*    LEVOFLOXACIN >=8 RESISTANT Resistant     TRIMETH/SULFA >=320 RESISTANT Resistant     * >=100,000 COLONIES/mL STENOTROPHOMONAS MALTOPHILIA     Labs: BNP (last 3 results) Recent Labs    08/05/22 0545 08/11/22 0020 09/10/22 2046  BNP 595.5* 851.4* 472.8*   Basic Metabolic Panel: Recent Labs  Lab 09/10/22 1124 09/10/22 2046 09/11/22 0523 09/11/22 0525 09/11/22 0854 09/11/22 1804 09/12/22 0943  NA 124*   < > 125* 125* 127* 125* 129*  K 4.1  --   --  3.5  --   --   --   CL 75*  --   --  77*  --   --   --   CO2 44*  --   --  42*  --   --   --   GLUCOSE 112*  --   --  90  --   --   --   BUN 15  --   --  15  --   --   --   CREATININE 1.21  --   --  1.21  --   --   --   CALCIUM 9.2  --   --  8.4*  --   --   --    < > =  values in this interval not displayed.   Liver Function Tests: Recent Labs  Lab 09/10/22 1124 09/11/22 0525  AST 33 27  ALT 31 29  ALKPHOS 50 41  BILITOT 0.6 0.8  PROT 6.2* 5.2*  ALBUMIN 3.8 2.8*   No results for input(s): "LIPASE", "AMYLASE" in the last 168 hours. No results for input(s): "AMMONIA" in the  last 168 hours. CBC: Recent Labs  Lab 09/10/22 1124 09/11/22 0525  WBC 10.5 9.3  NEUTROABS 7.4  --   HGB 9.9* 8.6*  HCT 32.3* 28.2*  MCV 94.2 97.6  PLT 231 201   Cardiac Enzymes: No results for input(s): "CKTOTAL", "CKMB", "CKMBINDEX", "TROPONINI" in the last 168 hours. BNP: Invalid input(s): "POCBNP" CBG: No results for input(s): "GLUCAP" in the last 168 hours. D-Dimer No results for input(s): "DDIMER" in the last 72 hours. Hgb A1c No results for input(s): "HGBA1C" in the last 72 hours. Lipid Profile No results for input(s): "CHOL", "HDL", "LDLCALC", "TRIG", "CHOLHDL", "LDLDIRECT" in the last 72 hours. Thyroid function studies No results for input(s): "TSH", "T4TOTAL", "T3FREE", "THYROIDAB" in the last 72 hours.  Invalid input(s): "FREET3" Anemia work up No results for input(s): "VITAMINB12", "FOLATE", "FERRITIN", "TIBC", "IRON", "RETICCTPCT" in the last 72 hours. Urinalysis    Component Value Date/Time   COLORURINE YELLOW 09/10/2022 1145   APPEARANCEUR HAZY (A) 09/10/2022 1145   LABSPEC 1.006 09/10/2022 1145   PHURINE 6.5 09/10/2022 1145   GLUCOSEU NEGATIVE 09/10/2022 1145   HGBUR LARGE (A) 09/10/2022 1145   BILIRUBINUR NEGATIVE 09/10/2022 1145   KETONESUR NEGATIVE 09/10/2022 1145   PROTEINUR TRACE (A) 09/10/2022 1145   NITRITE NEGATIVE 09/10/2022 1145   LEUKOCYTESUR LARGE (A) 09/10/2022 1145   Sepsis Labs Recent Labs  Lab 09/10/22 1124 09/11/22 0525  WBC 10.5 9.3   Microbiology Recent Results (from the past 240 hour(s))  Culture, blood (Routine x 2)     Status: None (Preliminary result)   Collection Time: 09/10/22 11:24 AM   Specimen: BLOOD LEFT ARM  Result Value Ref Range Status   Specimen Description   Final    BLOOD LEFT ARM Performed at [facility], [address], [place], [place] [address]    Special Requests   Final    BOTTLES DRAWN AEROBIC AND ANAEROBIC Blood Culture adequate volume Performed at [facility], [address] [place], [place] [address]    Culture   Final    NO GROWTH 3 DAYS Performed at [facility], [address] [place], [place] [address]    Report Status PENDING  Incomplete  Culture, blood (Routine x 2)     Status: None (Preliminary result)   Collection Time: 09/10/22 11:26 AM   Specimen: BLOOD RIGHT ARM  Result Value Ref Range Status   Specimen Description   Final    BLOOD RIGHT ARM Performed at [facility], [address], [place], [place] [address]    Special Requests   Final    BOTTLES DRAWN AEROBIC AND ANAEROBIC Blood Culture results may not be optimal due to an excessive volume of blood received in culture bottles Performed at [facility], [address], [place], [place] [address]    Culture   Final    NO GROWTH 3 DAYS Performed at [facility], [address] [place], [place] [address]    Report Status PENDING  Incomplete  Urine Culture     Status: Abnormal   Collection Time: 09/10/22 11:45 AM  Specimen: Urine, Random  Result Value Ref Range Status   Specimen Description   Final    URINE, RANDOM Performed at [facility], [address] [place], [place] [address]    Special Requests   Final    NONE Reflexed from [id number] Performed at [facility], [address] [place], [place] [address]    Culture >=100,000 COLONIES/mL STENOTROPHOMONAS MALTOPHILIA (A)  Final   Report Status 09/12/2022 FINAL  Final   Organism ID, Bacteria STENOTROPHOMONAS MALTOPHILIA (A)  Final      Susceptibility   Stenotrophomonas maltophilia - MIC*    LEVOFLOXACIN >=8 RESISTANT Resistant     TRIMETH/SULFA >=320 RESISTANT Resistant     * >=100,000 COLONIES/mL STENOTROPHOMONAS MALTOPHILIA     Time coordinating discharge:  35 minutes  SIGNED:   Dorcas Carrow, MD  [facility] 09/13/2022, 1:27 PM

## 2022-09-13 NOTE — Progress Notes (Signed)
PROGRESS NOTE    Gerald Valdez  ZOX:[id number] DOB: Jan 19, 1929 DOA: 09/10/2022 PCP: [name], MD    Brief Narrative:  89 year old with history of bladder cancer currently with chronic Foley catheter, chronic hypoxemic and hypercapnic respiratory failure due to COPD on 3 L oxygen at home, combined systolic and diastolic heart failure with known ejection fraction 40 to 45%, hypertension, hyperlipidemia, CKD stage IIIa and atrial fibrillation not on Eliquis due to recurrent hematuria sent to the emergency room from primary care physician's office due to increased confusion, urine culture concerning for multiple drug-resistant Pseudomonas UTI.  Recent hospitalization for volume overload requiring BiPAP.  Catheter was exchanged a week ago.  Cultures drawn from catheter.  Does have history of C. difficile in the past.  Patient hemodynamically stable, on 3 L oxygen in the emergency room.  Sodium 124 with recent normal sodium.  Repeat urine cultures and blood cultures were drawn patient was started on antibiotics and admitted to the hospital. Outpatient urine culture with Pseudomonas Inpatient urine culture with strenotophomonas resistant to Bactrim and Levaquin.  Clinically improving on ceftazidime.   Assessment & Plan:   Complicated UTI, acute UTI present on admission secondary to indwelling Foley catheter. Catheter exchanged. Urine cultures with Pseudomonas putida from outpatient(scanned in media section) Blood cultures negative. Repeat urine cultures with Strenotophomonas.  Final sensitivity pending.  Resistant to Bactrim and Levaquin. Infectious disease consulted for antibiotic recommendations.  Acute on chronic hyponatremia, hypovolemic hyponatremia.   Patient was treated with normal saline.  Sodium appropriately improving.    Acute metabolic encephalopathy with history of cognitive decline and recent multiple medical problems: Likely secondary to UTI and hyponatremia. Avoid  sedation.  Avoid narcotics.  Speech, PT and OT. His mental status is improving.  Remains very frail and debilitated at baseline.  Chronic medical issues including Essential hypertension, blood pressure stable.  Holding Lasix.  Half dose of Toprol-XL. Chronic hypoxemic failure on 3 L oxygen.  Pulm fairly stable.  On bronchodilators and Anoro. Paroxysmal A-fib: Taken off anticoagulation which is appropriate.  Rate controlled on Toprol-XL. Generalized anxiety disorder: On Lexapro.  Remains overall in poor clinical status.  Discharge pending antibiotic recommendations.   DVT prophylaxis: enoxaparin (LOVENOX) injection 30 mg Start: 09/11/22 1000 SCDs Start: 09/10/22 2029   Code Status: DNR Family Communication: Wife at the bedside Disposition Plan: Status is: Inpatient Remains inpatient appropriate because: Severe systemic illness, IV antibiotics     Consultants:  ID, will consult  Procedures:  None  Antimicrobials:  Ceftazidime 8/2---   Subjective:  Seen in the morning rounds.  Wife at the bedside.  Pleasant.  Confused.  Denies any complaints.  Afebrile.  Objective: Vitals:   09/12/22 2202 09/13/22 0432 09/13/22 0439 09/13/22 0850  BP: 101/68 112/63    Pulse: 90 91    Resp: (!) 22     Temp: 98.2 F (36.8 C) 97.6 F (36.4 C)    TempSrc: Oral Oral    SpO2: 97% 100%  100%  Weight:   58.9 kg   Height:        Intake/Output Summary (Last 24 hours) at 09/13/2022 1124 Last data filed at 09/13/2022 1102 Gross per 24 hour  Intake 1217.5 ml  Output 1200 ml  Net 17.5 ml   Filed Weights   09/11/22 0500 09/12/22 0521 09/13/22 0439  Weight: 58.2 kg 60.1 kg 58.9 kg    Examination:  General: Frail and debilitated.  Chronically sick looking.  Not in any distress.  Alert awake and oriented  to himself and family. Cardiovascular: S1-S2 normal. Respiratory: No added sounds.  On 3 L oxygen.  Looks comfortable.   Gastrointestinal: Soft.  Nontender.  Bowel sound present. Ext: No  swelling or edema.  No cyanosis. Multiple skin tears as documented. Foley catheter with amber-colored urine.      Data Reviewed: I have personally reviewed following labs and imaging studies  CBC: Recent Labs  Lab 09/10/22 1124 09/11/22 0525  WBC 10.5 9.3  NEUTROABS 7.4  --   HGB 9.9* 8.6*  HCT 32.3* 28.2*  MCV 94.2 97.6  PLT 231 201   Basic Metabolic Panel: Recent Labs  Lab 09/10/22 1124 09/10/22 2046 09/11/22 0523 09/11/22 0525 09/11/22 0854 09/11/22 1804 09/12/22 0943  NA 124*   < > 125* 125* 127* 125* 129*  K 4.1  --   --  3.5  --   --   --   CL 75*  --   --  77*  --   --   --   CO2 44*  --   --  42*  --   --   --   GLUCOSE 112*  --   --  90  --   --   --   BUN 15  --   --  15  --   --   --   CREATININE 1.21  --   --  1.21  --   --   --   CALCIUM 9.2  --   --  8.4*  --   --   --    < > = values in this interval not displayed.   GFR: Estimated Creatinine Clearance: 33.1 mL/min (by C-G formula based on SCr of 1.21 mg/dL). Liver Function Tests: Recent Labs  Lab 09/10/22 1124 09/11/22 0525  AST 33 27  ALT 31 29  ALKPHOS 50 41  BILITOT 0.6 0.8  PROT 6.2* 5.2*  ALBUMIN 3.8 2.8*   No results for input(s): "LIPASE", "AMYLASE" in the last 168 hours. No results for input(s): "AMMONIA" in the last 168 hours. Coagulation Profile: Recent Labs  Lab 09/10/22 1124  INR 1.0   Cardiac Enzymes: No results for input(s): "CKTOTAL", "CKMB", "CKMBINDEX", "TROPONINI" in the last 168 hours. BNP (last 3 results) No results for input(s): "PROBNP" in the last 8760 hours. HbA1C: No results for input(s): "HGBA1C" in the last 72 hours. CBG: No results for input(s): "GLUCAP" in the last 168 hours. Lipid Profile: No results for input(s): "CHOL", "HDL", "LDLCALC", "TRIG", "CHOLHDL", "LDLDIRECT" in the last 72 hours. Thyroid Function Tests: No results for input(s): "TSH", "T4TOTAL", "FREET4", "T3FREE", "THYROIDAB" in the last 72 hours. Anemia Panel: No results for  input(s): "VITAMINB12", "FOLATE", "FERRITIN", "TIBC", "IRON", "RETICCTPCT" in the last 72 hours. Sepsis Labs: Recent Labs  Lab 09/10/22 1124  LATICACIDVEN 1.3    Recent Results (from the past 240 hour(s))  Culture, blood (Routine x 2)     Status: None (Preliminary result)   Collection Time: 09/10/22 11:24 AM   Specimen: BLOOD LEFT ARM  Result Value Ref Range Status   Specimen Description   Final    BLOOD LEFT ARM Performed at [facility], [address] [place], [place] [address]    Special Requests   Final    BOTTLES DRAWN AEROBIC AND ANAEROBIC Blood Culture adequate volume Performed at [facility], [address], [place], [place] [address]    Culture   Final    NO GROWTH 3 DAYS Performed at [facility], [address], [place], [place] [address]    Report Status PENDING  Incomplete  Culture, blood (Routine x 2)     Status: None (Preliminary result)   Collection Time: 09/10/22 11:26 AM   Specimen: BLOOD RIGHT ARM  Result Value Ref Range Status   Specimen Description   Final    BLOOD RIGHT ARM Performed at [facility], [address] [place], [place] [address]    Special Requests   Final    BOTTLES DRAWN AEROBIC AND ANAEROBIC Blood Culture results may not be optimal due to an excessive volume of blood received in culture bottles Performed at [facility], [address] [place], [place] [address]    Culture   Final    NO GROWTH 3 DAYS Performed at [facility], [address], [place], [place] [address]    Report Status PENDING  Incomplete  Urine Culture     Status: Abnormal   Collection Time: 09/10/22 11:45 AM   Specimen: Urine, Random  Result Value Ref Range Status   Specimen Description   Final    URINE, RANDOM Performed at [facility], [address] [place], [place] [address]    Special Requests   Final    NONE Reflexed from [id number] Performed at [facility], [address] [place], [place] [address]    Culture >=100,000 COLONIES/mL STENOTROPHOMONAS MALTOPHILIA (A)  Final   Report Status 09/12/2022 FINAL  Final   Organism ID, Bacteria STENOTROPHOMONAS MALTOPHILIA (A)  Final      Susceptibility   Stenotrophomonas maltophilia - MIC*    LEVOFLOXACIN >=8 RESISTANT Resistant     TRIMETH/SULFA >=320 RESISTANT Resistant     * >=100,000 COLONIES/mL STENOTROPHOMONAS MALTOPHILIA         Radiology Studies: No results found.      Scheduled Meds:  atorvastatin  40 mg Oral Daily   Chlorhexidine Gluconate Cloth  6 each Topical Daily   enoxaparin (LOVENOX) injection  30 mg Subcutaneous Q24H   escitalopram  10 mg Oral QHS   feeding supplement  237 mL Oral BID BM   loratadine  10 mg Oral Daily   metoprolol succinate  25 mg Oral Daily   multivitamin with minerals  1 tablet Oral Q breakfast   pantoprazole  40 mg Oral BID AC   sodium chloride flush  3 mL Intravenous Q12H   tamsulosin  0.4 mg Oral Daily   umeclidinium-vilanterol  1 puff Inhalation Daily   Continuous Infusions:  sodium chloride       LOS: 3 days    Time spent: 35 minutes    Dorcas Carrow, MD [facility]

## 2022-09-14 DIAGNOSIS — I130 Hypertensive heart and chronic kidney disease with heart failure and stage 1 through stage 4 chronic kidney disease, or unspecified chronic kidney disease: Secondary | ICD-10-CM | POA: Diagnosis not present

## 2022-09-14 DIAGNOSIS — J9612 Chronic respiratory failure with hypercapnia: Secondary | ICD-10-CM | POA: Diagnosis not present

## 2022-09-14 DIAGNOSIS — I5023 Acute on chronic systolic (congestive) heart failure: Secondary | ICD-10-CM | POA: Diagnosis not present

## 2022-09-14 DIAGNOSIS — J449 Chronic obstructive pulmonary disease, unspecified: Secondary | ICD-10-CM | POA: Diagnosis not present

## 2022-09-14 DIAGNOSIS — N1831 Chronic kidney disease, stage 3a: Secondary | ICD-10-CM | POA: Diagnosis not present

## 2022-09-14 DIAGNOSIS — J9621 Acute and chronic respiratory failure with hypoxia: Secondary | ICD-10-CM | POA: Diagnosis not present

## 2022-09-14 NOTE — Consult Note (Signed)
[facility]    Date of Admission:  09/10/2022   Total days of inpatient antibiotics 3        Reason for Consult: UTI    Principal Problem:   Complicated UTI (urinary tract infection) Active Problems:   Essential hypertension   Hyperlipidemia   Chronic hypoxic respiratory failure (HCC)   Chronic anemia   Acute metabolic encephalopathy   Acute cystitis   History of bladder cancer on chronic Foley   Chronic systolic CHF preserved EF 40 to 45% (congestive heart failure) (HCC)   Hyponatremia   Atrial fibrillation, chronic (HCC)   COPD (chronic obstructive pulmonary disease) with chronic bronchitis   Assessment: 89 year old male with progressive dementia admitted with confusion and hypovolemic hyponatremia found to have: #Urine cultures positive for stenotrophomonas multocida - On arrival patient afebrile without leukocytosis -He was admitted for complicated UTI and hypovolemic hyponatremia initially started on ceftazidime.  ID engaged as sensitivities showed resistance to Levaquin and Bactrim.  Suspect that this colonization as patient not been compliant abdominal pain, no fevers or leukocytosis on admission.  Urine culture was gathered from indwelling Foley, UA showed negative nitrites, trace leukocytes and 0-5 squamous, 0-5 non squamous epithelial cell. - Daughter notes that patient had decreased p.o. intake Recommendations:  -Discontinue ceftazidime.  Spoke with wife and daughter in regards to UTI symptoms, patient has a chronic Foley.  This patient would look for abdominal pain, no systemic signs of fevers and chills.  Wife states that patient has spells of confusion when he wake(progressive dementia).  I suspect dehydration was due to p.o. intake. Microbiology:   Antibiotics: Ceftazidime 8/2-present  Cultures: Blood 8/2 no growth Urine 8/2 stenotrophomonas Stenotrophomonas maltophilia      MIC    LEVOFLOXACIN >=8 RESISTANT Resistant     TRIMETH/SULFA >=320 RESIS... Resistant      HPI: Gerald Valdez is a 89 y.o. male with history of bladder cancer on chronic Foley, chronic hypoxic and hypercapnic respite failure due to COPD on 3 L O2, CHF with reduced ejection fraction, pretension, hyperlipidemia, chronic anemia anemia, CKD stage III, A-fib not on Eliquis due to recurrent hematuria presented to ED referred by PCP due to increased confusion and urine cultures concerning for UTI.  In the ED, vital stable relatively, workup showed sodium 124, UA was positive trace leukocytes, and cultures grew stenotrophomonas.  Patient was initially started on ceftazidime.  Admitted for complicated UTI, acute on chronic hyponatremia, hypovolemic hyponatremia.  ID engaged for antibiotic recommendations.   Review of Systems: Review of Systems  All other systems reviewed and are negative.   Past Medical History:  Diagnosis Date   Acute duodenal ulcer with bleeding 01/30/2014   Acute post-hemorrhagic anemia 01/30/2014   Anxiety    Aortic atherosclerosis (HCC) 02/10/2020   Arthritis    Basal cell carcinoma    Cancer (HCC)    bladder   Chronic kidney disease    COPD exacerbation (HCC) 02/10/2020   Coronary artery disease    Dyspnea    Dyspnea on exertion 02/10/2020   Emphysema of lung (HCC)    Essential hypertension 02/10/2020   GERD (gastroesophageal reflux disease) 02/10/2020   GI bleed 01/30/2014   Hyperlipidemia 02/10/2020    Social History   Tobacco Use   Smoking status: Former    Current packs/day: 0.00    Types: Cigarettes    Start date: 1947    Quit date: 1994    Years since  quitting: 30.6   Smokeless tobacco: Never  Vaping Use   Vaping status: Never Used  Substance Use Topics   Alcohol use: Not Currently   Drug use: Never    Family History  Problem Relation Age of Onset   Hypertension Mother    Stroke Father    Scheduled Meds: Continuous Infusions: PRN Meds:. Allergies  Allergen Reactions   Prednisone  Other (See Comments)    Unknown reaction    OBJECTIVE: Blood pressure 105/62, pulse 68, temperature 97.9 F (36.6 C), temperature source Oral, resp. rate 20, height 5\' 6"  (1.676 m), weight 58.9 kg, SpO2 100%.  Physical Exam Constitutional:      General: He is not in acute distress.    Appearance: He is normal weight. He is not toxic-appearing.     Comments: He just woke up, confused  HENT:     Head: Normocephalic and atraumatic.     Right Ear: External ear normal.     Left Ear: External ear normal.     Nose: No congestion or rhinorrhea.     Mouth/Throat:     Mouth: Mucous membranes are moist.     Pharynx: Oropharynx is clear.  Eyes:     Extraocular Movements: Extraocular movements intact.     Conjunctiva/sclera: Conjunctivae normal.     Pupils: Pupils are equal, round, and reactive to light.  Cardiovascular:     Rate and Rhythm: Normal rate and regular rhythm.     Heart sounds: No murmur heard.    No friction rub. No gallop.  Pulmonary:     Effort: Pulmonary effort is normal.     Breath sounds: Normal breath sounds.  Abdominal:     General: Abdomen is flat. Bowel sounds are normal.     Palpations: Abdomen is soft.  Musculoskeletal:        General: No swelling.     Cervical back: Normal range of motion and neck supple.  Skin:    General: Skin is warm and dry.  Psychiatric:        Mood and Affect: Mood normal.     Lab Results Lab Results  Component Value Date   WBC 9.3 09/11/2022   HGB 8.6 (L) 09/11/2022   HCT 28.2 (L) 09/11/2022   MCV 97.6 09/11/2022   PLT 201 09/11/2022    Lab Results  Component Value Date   CREATININE 1.21 09/11/2022   BUN 15 09/11/2022   NA 129 (L) 09/12/2022   K 3.5 09/11/2022   CL 77 (L) 09/11/2022   CO2 42 (H) 09/11/2022    Lab Results  Component Value Date   ALT 29 09/11/2022   AST 27 09/11/2022   ALKPHOS 41 09/11/2022   BILITOT 0.8 09/11/2022       Danelle Earthly, MD [facility] 09/14/2022, 5:16 AM   I have personally spent 82 minutes involved in face-to-face and non-face-to-face activities for this patient on the day of the visit. Professional time spent includes the following activities: Preparing to see the patient (review of tests), Obtaining and/or reviewing separately obtained history (admission/discharge record), Performing a medically appropriate examination and/or evaluation , Ordering medications/tests/procedures, referring and communicating with other health care professionals, Documenting clinical information in the EMR, Independently interpreting results (not separately reported), Communicating results to the patient/family/caregiver, Counseling and educating the patient/family/caregiver and Care coordination (not separately reported).

## 2022-09-15 ENCOUNTER — Telehealth: Payer: Self-pay | Admitting: *Deleted

## 2022-09-15 DIAGNOSIS — Z466 Encounter for fitting and adjustment of urinary device: Secondary | ICD-10-CM | POA: Diagnosis not present

## 2022-09-15 DIAGNOSIS — S81801D Unspecified open wound, right lower leg, subsequent encounter: Secondary | ICD-10-CM | POA: Diagnosis not present

## 2022-09-15 DIAGNOSIS — N390 Urinary tract infection, site not specified: Secondary | ICD-10-CM | POA: Diagnosis not present

## 2022-09-15 DIAGNOSIS — C679 Malignant neoplasm of bladder, unspecified: Secondary | ICD-10-CM | POA: Diagnosis not present

## 2022-09-15 DIAGNOSIS — Z9981 Dependence on supplemental oxygen: Secondary | ICD-10-CM | POA: Diagnosis not present

## 2022-09-15 DIAGNOSIS — I5023 Acute on chronic systolic (congestive) heart failure: Secondary | ICD-10-CM | POA: Diagnosis not present

## 2022-09-15 DIAGNOSIS — I130 Hypertensive heart and chronic kidney disease with heart failure and stage 1 through stage 4 chronic kidney disease, or unspecified chronic kidney disease: Secondary | ICD-10-CM | POA: Diagnosis not present

## 2022-09-15 DIAGNOSIS — J9612 Chronic respiratory failure with hypercapnia: Secondary | ICD-10-CM | POA: Diagnosis not present

## 2022-09-15 DIAGNOSIS — R21 Rash and other nonspecific skin eruption: Secondary | ICD-10-CM | POA: Diagnosis not present

## 2022-09-15 DIAGNOSIS — F0390 Unspecified dementia without behavioral disturbance: Secondary | ICD-10-CM | POA: Diagnosis not present

## 2022-09-15 DIAGNOSIS — J449 Chronic obstructive pulmonary disease, unspecified: Secondary | ICD-10-CM | POA: Diagnosis not present

## 2022-09-15 DIAGNOSIS — I480 Paroxysmal atrial fibrillation: Secondary | ICD-10-CM | POA: Diagnosis not present

## 2022-09-15 DIAGNOSIS — E871 Hypo-osmolality and hyponatremia: Secondary | ICD-10-CM | POA: Diagnosis not present

## 2022-09-15 DIAGNOSIS — J9621 Acute and chronic respiratory failure with hypoxia: Secondary | ICD-10-CM | POA: Diagnosis not present

## 2022-09-15 DIAGNOSIS — N1831 Chronic kidney disease, stage 3a: Secondary | ICD-10-CM | POA: Diagnosis not present

## 2022-09-15 NOTE — Telephone Encounter (Signed)
Daughter called to cancel new patient appointment for 09/16/22. D/C home with hospice now for his bladder cancer as well as significant CHF and kidney issues. Just want to keep him comfortable.

## 2022-09-16 ENCOUNTER — Inpatient Hospital Stay: Payer: Medicare Other | Admitting: Oncology

## 2022-09-20 ENCOUNTER — Ambulatory Visit: Payer: Self-pay

## 2022-09-20 NOTE — Patient Outreach (Addendum)
  Care Coordination   Follow Up Visit Note   09/20/2022 Name: Gerald Valdez MRN: [id number] DOB: 1928-08-25  Gerald Valdez is a 89 y.o. year old male who sees Avva, Ravisankar, MD for primary care. I spoke with patient's daughter Gerald Valdez by phone today.  What matters to the patients health and wellness today?  Daughter Gerald Valdez feels her father is at end of life. Her mother does not wish to have patient transition to Hospice Care at this time.     Goals Addressed             This Visit's Progress    To better manage CHF       Care Coordination Interventions: Completed successful outbound call with daughter Gerald Valdez Evaluation of current treatment plan related to CHF  and patient's adherence to plan as established by provider Determined patient experienced a UTI resulting in an inpatient admission  Discussed patient's CHF remain stable however is no longer wearing his BiPAP, he discontinued use about 4 weeks ago and his doctor is aware Discussed patient is being followed by Palliative Care, patient's wife does not wish for patient to transition to Hospice Care at this time  Determined patient's daughter Gerald Valdez and son continue to be available to assist their mother with patient care and transportation Reviewed next scheduled follow up with Pulmonology is scheduled for 11/01/22 @11 :00 AM, however daughter Gerald Valdez feels  her father is approaching end of life and may pass away in the upcoming weeks Discussed Gerald Valdez may contact patient's PCP to request a medication for anxiety as she is noting patient's anxiety has worsened  Active listening / Reflection utilized  Emotional Support Provided    Interventions Today    Flowsheet Row Most Recent Value  Chronic Disease   Chronic disease during today's visit Congestive Heart Failure (CHF)  General Interventions   General Interventions Discussed/Reviewed General Interventions Reviewed, General Interventions Discussed, Doctor  Visits  Doctor Visits Discussed/Reviewed Doctor Visits Reviewed, Doctor Visits Discussed, Specialist, PCP  Education Interventions   Education Provided Provided Education  Provided Verbal Education On Medication, When to see the doctor  Mental Health Interventions   Mental Health Discussed/Reviewed Mental Health Discussed, Mental Health Reviewed, Anxiety  Pharmacy Interventions   Pharmacy Dicussed/Reviewed Pharmacy Topics Discussed, Pharmacy Topics Reviewed, Medications and their functions  Advanced Directive Interventions   Advanced Directives Discussed/Reviewed End of Life  End of Life Palliative          SDOH assessments and interventions completed:  No     Care Coordination Interventions:  Yes, provided   Follow up plan: Follow up call scheduled for 11/03/22 @09 :30 AM    Encounter Outcome:  Pt. Visit Completed

## 2022-09-20 NOTE — Patient Instructions (Signed)
Visit Information  Thank you for taking time to visit with me today. Please don't hesitate to contact me if I can be of assistance to you.   Following are the goals we discussed today:   Goals Addressed             This Visit's Progress    To better manage CHF       Care Coordination Interventions: Completed successful outbound call with daughter Irene Limbo Evaluation of current treatment plan related to CHF  and patient's adherence to plan as established by provider Determined patient experienced a UTI resulting in an inpatient admission  Discussed patient's CHF remain stable however is no longer wearing his BiPAP, he discontinued use about 4 weeks ago and his doctor is aware Discussed patient is being followed by Palliative Care, patient's wife does not wish for patient to transition to Hospice Care at this time  Determined patient's daughter Glendon Axe and son continue to be available to assist their mother with patient care and transportation Reviewed next scheduled follow up with Pulmonology is scheduled for 11/01/22 @11 :00 AM, however daughter Allena Earing feels  her father is approaching end of life and may pass away in the upcoming weeks Discussed Louanne may contact patient's PCP to request a medication for anxiety as she is noting patient's anxiety has worsened  Active listening / Reflection utilized  Emotional Support Provided        Our next appointment is by telephone on 11/03/22 at 09:30 AM  Please call the care guide team at [phone] if you need to cancel or reschedule your appointment.   If you are experiencing a Mental Health or Behavioral Health Crisis or need someone to talk to, please call 1-800-273-TALK (toll free, 24 hour hotline)  Patient verbalizes understanding of instructions and care plan provided today and agrees to view in MyChart. Active MyChart status and patient understanding of how to access instructions and care plan via MyChart confirmed with  patient.     Delsa Sale, RN, BSN, CCM Care Management Coordinator [facility] Care Management  Direct Phone: [phone]

## 2022-09-21 DIAGNOSIS — I130 Hypertensive heart and chronic kidney disease with heart failure and stage 1 through stage 4 chronic kidney disease, or unspecified chronic kidney disease: Secondary | ICD-10-CM | POA: Diagnosis not present

## 2022-09-21 DIAGNOSIS — J9612 Chronic respiratory failure with hypercapnia: Secondary | ICD-10-CM | POA: Diagnosis not present

## 2022-09-21 DIAGNOSIS — J9621 Acute and chronic respiratory failure with hypoxia: Secondary | ICD-10-CM | POA: Diagnosis not present

## 2022-09-21 DIAGNOSIS — S81801D Unspecified open wound, right lower leg, subsequent encounter: Secondary | ICD-10-CM | POA: Diagnosis not present

## 2022-09-21 DIAGNOSIS — N390 Urinary tract infection, site not specified: Secondary | ICD-10-CM | POA: Diagnosis not present

## 2022-09-21 DIAGNOSIS — J449 Chronic obstructive pulmonary disease, unspecified: Secondary | ICD-10-CM | POA: Diagnosis not present

## 2022-09-24 DIAGNOSIS — J449 Chronic obstructive pulmonary disease, unspecified: Secondary | ICD-10-CM | POA: Diagnosis not present

## 2022-09-24 DIAGNOSIS — S81801D Unspecified open wound, right lower leg, subsequent encounter: Secondary | ICD-10-CM | POA: Diagnosis not present

## 2022-09-24 DIAGNOSIS — N390 Urinary tract infection, site not specified: Secondary | ICD-10-CM | POA: Diagnosis not present

## 2022-09-24 DIAGNOSIS — C678 Malignant neoplasm of overlapping sites of bladder: Secondary | ICD-10-CM | POA: Diagnosis not present

## 2022-09-24 DIAGNOSIS — J9612 Chronic respiratory failure with hypercapnia: Secondary | ICD-10-CM | POA: Diagnosis not present

## 2022-09-24 DIAGNOSIS — J9621 Acute and chronic respiratory failure with hypoxia: Secondary | ICD-10-CM | POA: Diagnosis not present

## 2022-09-24 DIAGNOSIS — I130 Hypertensive heart and chronic kidney disease with heart failure and stage 1 through stage 4 chronic kidney disease, or unspecified chronic kidney disease: Secondary | ICD-10-CM | POA: Diagnosis not present

## 2022-09-24 DIAGNOSIS — R338 Other retention of urine: Secondary | ICD-10-CM | POA: Diagnosis not present

## 2022-09-28 DIAGNOSIS — I130 Hypertensive heart and chronic kidney disease with heart failure and stage 1 through stage 4 chronic kidney disease, or unspecified chronic kidney disease: Secondary | ICD-10-CM | POA: Diagnosis not present

## 2022-09-28 DIAGNOSIS — J449 Chronic obstructive pulmonary disease, unspecified: Secondary | ICD-10-CM | POA: Diagnosis not present

## 2022-09-28 DIAGNOSIS — S81801D Unspecified open wound, right lower leg, subsequent encounter: Secondary | ICD-10-CM | POA: Diagnosis not present

## 2022-09-28 DIAGNOSIS — J9612 Chronic respiratory failure with hypercapnia: Secondary | ICD-10-CM | POA: Diagnosis not present

## 2022-09-28 DIAGNOSIS — J9621 Acute and chronic respiratory failure with hypoxia: Secondary | ICD-10-CM | POA: Diagnosis not present

## 2022-09-28 DIAGNOSIS — N390 Urinary tract infection, site not specified: Secondary | ICD-10-CM | POA: Diagnosis not present

## 2022-10-05 DIAGNOSIS — N390 Urinary tract infection, site not specified: Secondary | ICD-10-CM | POA: Diagnosis not present

## 2022-10-05 DIAGNOSIS — I130 Hypertensive heart and chronic kidney disease with heart failure and stage 1 through stage 4 chronic kidney disease, or unspecified chronic kidney disease: Secondary | ICD-10-CM | POA: Diagnosis not present

## 2022-10-05 DIAGNOSIS — J9612 Chronic respiratory failure with hypercapnia: Secondary | ICD-10-CM | POA: Diagnosis not present

## 2022-10-05 DIAGNOSIS — J9621 Acute and chronic respiratory failure with hypoxia: Secondary | ICD-10-CM | POA: Diagnosis not present

## 2022-10-05 DIAGNOSIS — J449 Chronic obstructive pulmonary disease, unspecified: Secondary | ICD-10-CM | POA: Diagnosis not present

## 2022-10-05 DIAGNOSIS — S81801D Unspecified open wound, right lower leg, subsequent encounter: Secondary | ICD-10-CM | POA: Diagnosis not present

## 2022-10-06 DIAGNOSIS — I700 Atherosclerosis of aorta: Secondary | ICD-10-CM | POA: Diagnosis not present

## 2022-10-06 DIAGNOSIS — E785 Hyperlipidemia, unspecified: Secondary | ICD-10-CM | POA: Diagnosis not present

## 2022-10-06 DIAGNOSIS — N1832 Chronic kidney disease, stage 3b: Secondary | ICD-10-CM | POA: Diagnosis not present

## 2022-10-06 DIAGNOSIS — I129 Hypertensive chronic kidney disease with stage 1 through stage 4 chronic kidney disease, or unspecified chronic kidney disease: Secondary | ICD-10-CM | POA: Diagnosis not present

## 2022-10-06 DIAGNOSIS — D649 Anemia, unspecified: Secondary | ICD-10-CM | POA: Diagnosis not present

## 2022-10-07 ENCOUNTER — Emergency Department (HOSPITAL_COMMUNITY): Payer: Medicare Other

## 2022-10-07 ENCOUNTER — Inpatient Hospital Stay (HOSPITAL_COMMUNITY)
Admission: EM | Admit: 2022-10-07 | Discharge: 2022-10-12 | DRG: 189 | Disposition: A | Discharge status: Hospice | Payer: Medicare Other | Attending: Family Medicine | Admitting: Family Medicine

## 2022-10-07 DIAGNOSIS — G9341 Metabolic encephalopathy: Secondary | ICD-10-CM | POA: Diagnosis present

## 2022-10-07 DIAGNOSIS — I5042 Chronic combined systolic (congestive) and diastolic (congestive) heart failure: Secondary | ICD-10-CM | POA: Diagnosis not present

## 2022-10-07 DIAGNOSIS — R0602 Shortness of breath: Secondary | ICD-10-CM | POA: Diagnosis not present

## 2022-10-07 DIAGNOSIS — R918 Other nonspecific abnormal finding of lung field: Secondary | ICD-10-CM | POA: Diagnosis not present

## 2022-10-07 DIAGNOSIS — J189 Pneumonia, unspecified organism: Secondary | ICD-10-CM | POA: Diagnosis present

## 2022-10-07 DIAGNOSIS — Z9981 Dependence on supplemental oxygen: Secondary | ICD-10-CM | POA: Diagnosis not present

## 2022-10-07 DIAGNOSIS — I110 Hypertensive heart disease with heart failure: Secondary | ICD-10-CM | POA: Diagnosis not present

## 2022-10-07 DIAGNOSIS — F03C18 Unspecified dementia, severe, with other behavioral disturbance: Secondary | ICD-10-CM | POA: Diagnosis not present

## 2022-10-07 DIAGNOSIS — N179 Acute kidney failure, unspecified: Secondary | ICD-10-CM | POA: Diagnosis present

## 2022-10-07 DIAGNOSIS — J9600 Acute respiratory failure, unspecified whether with hypoxia or hypercapnia: Secondary | ICD-10-CM | POA: Diagnosis not present

## 2022-10-07 DIAGNOSIS — Z79899 Other long term (current) drug therapy: Secondary | ICD-10-CM

## 2022-10-07 DIAGNOSIS — I5023 Acute on chronic systolic (congestive) heart failure: Secondary | ICD-10-CM

## 2022-10-07 DIAGNOSIS — R319 Hematuria, unspecified: Secondary | ICD-10-CM | POA: Diagnosis present

## 2022-10-07 DIAGNOSIS — J9622 Acute and chronic respiratory failure with hypercapnia: Secondary | ICD-10-CM | POA: Diagnosis present

## 2022-10-07 DIAGNOSIS — N390 Urinary tract infection, site not specified: Secondary | ICD-10-CM | POA: Diagnosis not present

## 2022-10-07 DIAGNOSIS — D630 Anemia in neoplastic disease: Secondary | ICD-10-CM | POA: Diagnosis present

## 2022-10-07 DIAGNOSIS — C678 Malignant neoplasm of overlapping sites of bladder: Secondary | ICD-10-CM | POA: Diagnosis not present

## 2022-10-07 DIAGNOSIS — E871 Hypo-osmolality and hyponatremia: Secondary | ICD-10-CM | POA: Diagnosis present

## 2022-10-07 DIAGNOSIS — Z7401 Bed confinement status: Secondary | ICD-10-CM | POA: Diagnosis not present

## 2022-10-07 DIAGNOSIS — E43 Unspecified severe protein-calorie malnutrition: Secondary | ICD-10-CM | POA: Diagnosis not present

## 2022-10-07 DIAGNOSIS — Z515 Encounter for palliative care: Secondary | ICD-10-CM

## 2022-10-07 DIAGNOSIS — F0390 Unspecified dementia without behavioral disturbance: Secondary | ICD-10-CM

## 2022-10-07 DIAGNOSIS — E785 Hyperlipidemia, unspecified: Secondary | ICD-10-CM | POA: Diagnosis present

## 2022-10-07 DIAGNOSIS — I5022 Chronic systolic (congestive) heart failure: Secondary | ICD-10-CM | POA: Diagnosis not present

## 2022-10-07 DIAGNOSIS — I482 Chronic atrial fibrillation, unspecified: Secondary | ICD-10-CM | POA: Diagnosis present

## 2022-10-07 DIAGNOSIS — R404 Transient alteration of awareness: Secondary | ICD-10-CM | POA: Diagnosis not present

## 2022-10-07 DIAGNOSIS — F0280 Dementia in other diseases classified elsewhere without behavioral disturbance: Secondary | ICD-10-CM | POA: Diagnosis not present

## 2022-10-07 DIAGNOSIS — Z7189 Other specified counseling: Secondary | ICD-10-CM

## 2022-10-07 DIAGNOSIS — I130 Hypertensive heart and chronic kidney disease with heart failure and stage 1 through stage 4 chronic kidney disease, or unspecified chronic kidney disease: Secondary | ICD-10-CM | POA: Diagnosis not present

## 2022-10-07 DIAGNOSIS — R4589 Other symptoms and signs involving emotional state: Secondary | ICD-10-CM | POA: Diagnosis not present

## 2022-10-07 DIAGNOSIS — Z66 Do not resuscitate: Secondary | ICD-10-CM | POA: Diagnosis present

## 2022-10-07 DIAGNOSIS — Z711 Person with feared health complaint in whom no diagnosis is made: Secondary | ICD-10-CM

## 2022-10-07 DIAGNOSIS — J9601 Acute respiratory failure with hypoxia: Secondary | ICD-10-CM | POA: Diagnosis not present

## 2022-10-07 DIAGNOSIS — R0902 Hypoxemia: Secondary | ICD-10-CM | POA: Diagnosis not present

## 2022-10-07 DIAGNOSIS — J439 Emphysema, unspecified: Secondary | ICD-10-CM | POA: Diagnosis present

## 2022-10-07 DIAGNOSIS — Z1152 Encounter for screening for COVID-19: Secondary | ICD-10-CM

## 2022-10-07 DIAGNOSIS — I959 Hypotension, unspecified: Secondary | ICD-10-CM | POA: Diagnosis not present

## 2022-10-07 DIAGNOSIS — J449 Chronic obstructive pulmonary disease, unspecified: Secondary | ICD-10-CM | POA: Diagnosis present

## 2022-10-07 DIAGNOSIS — J9621 Acute and chronic respiratory failure with hypoxia: Secondary | ICD-10-CM | POA: Diagnosis present

## 2022-10-07 DIAGNOSIS — R64 Cachexia: Secondary | ICD-10-CM | POA: Diagnosis present

## 2022-10-07 DIAGNOSIS — D649 Anemia, unspecified: Secondary | ICD-10-CM | POA: Diagnosis present

## 2022-10-07 DIAGNOSIS — R000 Tachycardia, unspecified: Secondary | ICD-10-CM | POA: Diagnosis not present

## 2022-10-07 DIAGNOSIS — I700 Atherosclerosis of aorta: Secondary | ICD-10-CM | POA: Diagnosis not present

## 2022-10-07 DIAGNOSIS — F0394 Unspecified dementia, unspecified severity, with anxiety: Secondary | ICD-10-CM | POA: Diagnosis present

## 2022-10-07 DIAGNOSIS — J440 Chronic obstructive pulmonary disease with acute lower respiratory infection: Secondary | ICD-10-CM | POA: Diagnosis present

## 2022-10-07 DIAGNOSIS — N1831 Chronic kidney disease, stage 3a: Secondary | ICD-10-CM | POA: Diagnosis present

## 2022-10-07 DIAGNOSIS — E440 Moderate protein-calorie malnutrition: Secondary | ICD-10-CM | POA: Diagnosis present

## 2022-10-07 DIAGNOSIS — R0603 Acute respiratory distress: Secondary | ICD-10-CM

## 2022-10-07 DIAGNOSIS — Z8249 Family history of ischemic heart disease and other diseases of the circulatory system: Secondary | ICD-10-CM

## 2022-10-07 DIAGNOSIS — Z87891 Personal history of nicotine dependence: Secondary | ICD-10-CM

## 2022-10-07 DIAGNOSIS — Z823 Family history of stroke: Secondary | ICD-10-CM

## 2022-10-07 DIAGNOSIS — J9602 Acute respiratory failure with hypercapnia: Secondary | ICD-10-CM | POA: Diagnosis not present

## 2022-10-07 DIAGNOSIS — I2510 Atherosclerotic heart disease of native coronary artery without angina pectoris: Secondary | ICD-10-CM | POA: Diagnosis present

## 2022-10-07 DIAGNOSIS — R0689 Other abnormalities of breathing: Secondary | ICD-10-CM | POA: Diagnosis not present

## 2022-10-07 DIAGNOSIS — R5381 Other malaise: Secondary | ICD-10-CM | POA: Diagnosis present

## 2022-10-07 DIAGNOSIS — R4182 Altered mental status, unspecified: Secondary | ICD-10-CM | POA: Diagnosis not present

## 2022-10-07 DIAGNOSIS — I509 Heart failure, unspecified: Secondary | ICD-10-CM | POA: Diagnosis not present

## 2022-10-07 DIAGNOSIS — Z85828 Personal history of other malignant neoplasm of skin: Secondary | ICD-10-CM

## 2022-10-07 LAB — BLOOD GAS, VENOUS
Acid-Base Excess: 12.6 mmol/L — ABNORMAL HIGH (ref 0.0–2.0)
Bicarbonate: 43.3 mmol/L — ABNORMAL HIGH (ref 20.0–28.0)
O2 Saturation: 28 %
Patient temperature: 37
pCO2, Ven: 90 mmHg (ref 44–60)
pH, Ven: 7.29 (ref 7.25–7.43)
pO2, Ven: <31 mmHg — CL (ref 32–45)

## 2022-10-07 LAB — CBC WITH DIFFERENTIAL/PLATELET
Abs Immature Granulocytes: 0.18 10*3/uL — ABNORMAL HIGH (ref 0.00–0.07)
Basophils Absolute: 0 10*3/uL (ref 0.0–0.1)
Basophils Relative: 0 %
Eosinophils Absolute: 0 10*3/uL (ref 0.0–0.5)
Eosinophils Relative: 0 %
HCT: 33.5 % — ABNORMAL LOW (ref 39.0–52.0)
Hemoglobin: 10.4 g/dL — ABNORMAL LOW (ref 13.0–17.0)
Immature Granulocytes: 2 %
Lymphocytes Relative: 6 %
Lymphs Abs: 0.7 10*3/uL (ref 0.7–4.0)
MCH: 28.7 pg (ref 26.0–34.0)
MCHC: 31 g/dL (ref 30.0–36.0)
MCV: 92.5 fL (ref 80.0–100.0)
Monocytes Absolute: 1.5 10*3/uL — ABNORMAL HIGH (ref 0.1–1.0)
Monocytes Relative: 13 %
Neutro Abs: 8.9 10*3/uL — ABNORMAL HIGH (ref 1.7–7.7)
Neutrophils Relative %: 79 %
Platelets: 273 10*3/uL (ref 150–400)
RBC: 3.62 MIL/uL — ABNORMAL LOW (ref 4.22–5.81)
RDW: 13.2 % (ref 11.5–15.5)
WBC: 11.3 10*3/uL — ABNORMAL HIGH (ref 4.0–10.5)
nRBC: 0.2 % (ref 0.0–0.2)

## 2022-10-07 LAB — URINALYSIS, W/ REFLEX TO CULTURE (INFECTION SUSPECTED)
Bacteria, UA: NONE SEEN
Bilirubin Urine: NEGATIVE
Glucose, UA: NEGATIVE mg/dL
Ketones, ur: NEGATIVE mg/dL
Nitrite: POSITIVE — AB
Protein, ur: 100 mg/dL — AB
RBC / HPF: >50 RBC/hpf (ref 0–5)
Specific Gravity, Urine: 1.009 (ref 1.005–1.030)
WBC, UA: >50 WBC/hpf (ref 0–5)
pH: 7 (ref 5.0–8.0)

## 2022-10-07 LAB — CREATININE, URINE, RANDOM: Creatinine, Urine: 53 mg/dL

## 2022-10-07 LAB — I-STAT CHEM 8, ED
BUN: 24 mg/dL — ABNORMAL HIGH (ref 8–23)
Calcium, Ion: 1.06 mmol/L — ABNORMAL LOW (ref 1.15–1.40)
Chloride: 72 mmol/L — ABNORMAL LOW (ref 98–111)
Creatinine, Ser: 1.7 mg/dL — ABNORMAL HIGH (ref 0.61–1.24)
Glucose, Bld: 188 mg/dL — ABNORMAL HIGH (ref 70–99)
HCT: 32 % — ABNORMAL LOW (ref 39.0–52.0)
Hemoglobin: 10.9 g/dL — ABNORMAL LOW (ref 13.0–17.0)
Potassium: 4.1 mmol/L (ref 3.5–5.1)
Sodium: 117 mmol/L — CL (ref 135–145)
TCO2: 39 mmol/L — ABNORMAL HIGH (ref 22–32)

## 2022-10-07 LAB — SARS CORONAVIRUS 2 BY RT PCR: SARS Coronavirus 2 by RT PCR: NEGATIVE

## 2022-10-07 LAB — COMPREHENSIVE METABOLIC PANEL
ALT: 30 U/L (ref 0–44)
AST: 48 U/L — ABNORMAL HIGH (ref 15–41)
Albumin: 4 g/dL (ref 3.5–5.0)
Alkaline Phosphatase: 81 U/L (ref 38–126)
Anion gap: 13 (ref 5–15)
BUN: 21 mg/dL (ref 8–23)
CO2: 35 mmol/L — ABNORMAL HIGH (ref 22–32)
Calcium: 8.7 mg/dL — ABNORMAL LOW (ref 8.9–10.3)
Chloride: 72 mmol/L — ABNORMAL LOW (ref 98–111)
Creatinine, Ser: 1.47 mg/dL — ABNORMAL HIGH (ref 0.61–1.24)
GFR, Estimated: 45 mL/min — ABNORMAL LOW (ref >60)
Glucose, Bld: 197 mg/dL — ABNORMAL HIGH (ref 70–99)
Potassium: 4.3 mmol/L (ref 3.5–5.1)
Sodium: 120 mmol/L — ABNORMAL LOW (ref 135–145)
Total Bilirubin: 0.6 mg/dL (ref 0.3–1.2)
Total Protein: 7.1 g/dL (ref 6.5–8.1)

## 2022-10-07 LAB — BRAIN NATRIURETIC PEPTIDE: B Natriuretic Peptide: 467 pg/mL — ABNORMAL HIGH (ref 0.0–100.0)

## 2022-10-07 LAB — SODIUM, URINE, RANDOM: Sodium, Ur: 37 mmol/L

## 2022-10-07 LAB — TROPONIN I (HIGH SENSITIVITY)
Troponin I (High Sensitivity): 32 ng/L — ABNORMAL HIGH (ref <18)
Troponin I (High Sensitivity): 38 ng/L — ABNORMAL HIGH (ref <18)

## 2022-10-07 MED ORDER — IPRATROPIUM BROMIDE 0.02 % IN SOLN
1.0000 mg | Freq: Once | RESPIRATORY_TRACT | Status: AC
  Administered 2022-10-07: 1 mg via RESPIRATORY_TRACT
  Filled 2022-10-07: qty 5

## 2022-10-07 MED ORDER — ALBUTEROL SULFATE (2.5 MG/3ML) 0.083% IN NEBU
15.0000 mg/h | INHALATION_SOLUTION | Freq: Once | RESPIRATORY_TRACT | Status: AC
  Administered 2022-10-07: 15 mg/h via RESPIRATORY_TRACT
  Filled 2022-10-07: qty 18

## 2022-10-07 MED ORDER — METHYLPREDNISOLONE SODIUM SUCC 125 MG IJ SOLR
125.0000 mg | Freq: Once | INTRAMUSCULAR | Status: AC
  Administered 2022-10-07: 125 mg via INTRAVENOUS
  Filled 2022-10-07: qty 2

## 2022-10-07 MED ORDER — FUROSEMIDE 10 MG/ML IJ SOLN
40.0000 mg | Freq: Once | INTRAMUSCULAR | Status: AC
  Administered 2022-10-07: 40 mg via INTRAVENOUS
  Filled 2022-10-07: qty 4

## 2022-10-07 MED ORDER — CHLORHEXIDINE GLUCONATE CLOTH 2 % EX PADS: 6.0000 | MEDICATED_PAD | Freq: Every day | CUTANEOUS | Status: DC

## 2022-10-07 NOTE — ED Notes (Signed)
PCO2 90  PO2 less than 31  MD made aware.

## 2022-10-07 NOTE — ED Notes (Signed)
Silverio Lay, MD notified of I-Stat chem 8 values.

## 2022-10-07 NOTE — H&P (Signed)
YUN CONN FAO:[id number] DOB: September 25, 1928 DOA: 10/07/2022     PCP: [name], MD   Outpatient Specialists:   CARDS:  Dr. Kristeen Miss, MD    Pulmonary    Dr. Everardo All  Oncology   Dr. Truett Perna    Patient arrived to ER on 10/07/22 at 2029 Referred by Attending Charlynne Pander, MD   Patient coming from:    home Lives   With family    Chief Complaint:   Chief Complaint  Patient presents with   Altered Mental Status    Pt from home altered mental status more than normal,hx dementia, pt family called, more agitated as well, per EMS, pt had foley changed by family, family reported to EMS that foley normally bloody after being changed, pt alert but not orientated at this time.     HPI: BENEDICTO NAUMANN is a 89 y.o. male with medical history significant of CHF, chronic Foley catheter, chronic hypoxemic and hypercapnic respiratory failure due to COPD on 2-3 L Fio2 combined systolic and diastolic heart failure with known ejection fraction 40 to 45%, hypertension, hyperlipidemia, CKD stage IIIa and atrial fibrillation not on Eliquis due to recurrent hematuria  Hyponatremia, history of C. difficile in the past. dementia   Presented with  confusion  Pt is more adjitated, pt has hx of indwelling foley ,dementia  At 7 PM he was not responsive  EMS was called  At baseline mumbles  Recent admit for UTI and CHF needing Bipap  Chronic hyponatremia but seems worse today Hyper capneic started on BIPAP  Of note his foley has been changed yesterday and had some blood in it        Noted to need 4L and satting in 80% on 2L During the last admission he was suspected to have colonization of urinary bladder.  Urine at some point did grew Strenotophomonas.  - and he was given 4 days of ceftazidime  Denies significant ETOH intake   Does not smoke   Lab Results  Component Value Date   SARSCOV2NAA NEGATIVE 10/07/2022   SARSCOV2NAA NEGATIVE 07/19/2022   SARSCOV2NAA RESULT: NEGATIVE  11/18/2020   SARSCOV2NAA NEGATIVE 02/10/2020    Regarding pertinent Chronic problems:    Hyperlipidemia - on Crestor  Lipid Panel     Component Value Date/Time   CHOL 158 02/10/2020 1717   TRIG 64 02/10/2020 1717   HDL 65 02/10/2020 1717   CHOLHDL 2.4 02/10/2020 1717   VLDL 13 02/10/2020 1717   LDLCALC 80 02/10/2020 1717     HTN on lasix, Toprol   chronic CHF diastolic/systolic/ combined - last echo  Recent Results (from the past 69629 hour(s))  ECHOCARDIOGRAM COMPLETE   Collection Time: 07/21/22  4:23 PM  Result Value   Weight 2,334.4   Height 66   BP 108/53   S' Lateral 3.30   AR max vel 1.62   AV Area VTI 1.77   AV Mean grad 4.0   AV Peak grad 6.7   Ao pk vel 1.29   Area-P 1/2 4.67   AV Area mean vel 1.57   Est EF 45 - 50%   Narrative      ECHOCARDIOGRAM REPORT        1. Left ventricular ejection fraction, by estimation, is 45 to 50%. The left ventricle has mildly decreased function. The left ventricle has no regional wall motion abnormalities. There is mild left ventricular hypertrophy. Left ventricular diastolic  parameters are indeterminate.  2. Right ventricular systolic  function is normal. The right ventricular size is mildly enlarged.  3. Right atrial size was mildly dilated.  4. A small pericardial effusion is present. The pericardial effusion is anterior to the right ventricle. There is no evidence of cardiac tamponade.  5. The mitral valve is degenerative. Mild to moderate mitral valve regurgitation.  6. The aortic valve was not well visualized. Aortic valve regurgitation is not visualized.             COPD - followed by pulmonology    on baseline oxygen  3L,    A. Fib -   atrial fibrillation CHA2DS2 vas score     5    Not on anticoagulation secondary to Risk of Falls, and hematuria         -  Rate control:  Currently controlled with  Toprolol,          CKD stage IIIa-   baseline Cr 1.2 CrCl cannot be calculated (Unknown ideal weight.).  Lab  Results  Component Value Date   CREATININE 1.70 (H) 10/07/2022   CREATININE 1.47 (H) 10/07/2022   CREATININE 1.21 09/11/2022   Lab Results  Component Value Date   NA 117 (LL) 10/07/2022   CL 72 (L) 10/07/2022   K 4.1 10/07/2022   CO2 35 (H) 10/07/2022   BUN 24 (H) 10/07/2022   CREATININE 1.70 (H) 10/07/2022   GFRNONAA 45 (L) 10/07/2022   CALCIUM 8.7 (L) 10/07/2022   ALBUMIN 4.0 10/07/2022   GLUCOSE 188 (H) 10/07/2022      Dementia -      Chronic anemia - baseline hg Hemoglobin & Hematocrit  Recent Labs    09/11/22 0525 10/07/22 2118 10/07/22 2132  HGB 8.6* 10.4* 10.9*   Iron/TIBC/Ferritin/ %Sat    Component Value Date/Time   IRON 14 (L) 07/03/2021 0538   TIBC 199 (L) 07/03/2021 0538   FERRITIN 28 08/05/2022 0545   IRONPCTSAT 7 (L) 07/03/2021 0538     While in ER:   Felt to be initially fluid overloaded started on a dose of Lasix VBG showed evidence of hypercapneic respiratory failure started on BiPAP    Lab Orders         SARS Coronavirus 2 by RT PCR (hospital order, performed in [facility] hospital lab) *cepheid single result test* Anterior Nasal Swab         Urine Culture         Comprehensive metabolic panel         CBC with Differential         Brain natriuretic peptide         Urinalysis, w/ Reflex to Culture (Infection Suspected) -Urine, Clean Catch         Blood gas, venous (at [facility] and [facility])         Osmolality         Osmolality, urine         Sodium, urine, random         Creatinine, urine, random         Blood gas, venous         I-stat chem 8, ED (not at [facility], [facility] or [facility])      CT HEAD   NON acute    CXR -  COPD indicating early edema or pneumonia.    Following Medications were ordered in ER: Medications  methylPREDNISolone sodium succinate (SOLU-MEDROL) 125 mg/2 mL injection 125 mg (125 mg Intravenous Given 10/07/22 2227)  albuterol (PROVENTIL) (2.5 MG/3ML) 0.083%  nebulizer solution (15 mg/hr Nebulization Given 10/07/22 2220)  ipratropium  (ATROVENT) nebulizer solution 1 mg (1 mg Nebulization Given 10/07/22 2220)  furosemide (LASIX) injection 40 mg (40 mg Intravenous Given 10/07/22 2227)        ED Triage Vitals  Encounter Vitals Group     BP 10/07/22 2045 124/65     Systolic BP Percentile --      Diastolic BP Percentile --      Pulse Rate 10/07/22 2046 97     Resp 10/07/22 2046 (!) 22     Temp 10/07/22 2156 97.9 F (36.6 C)     Temp Source 10/07/22 2156 Rectal     SpO2 10/07/22 2046 (!) 85 %     Weight --      Height --      Head Circumference --      Peak Flow --      Pain Score --      Pain Loc --      Pain Education --      Exclude from Growth Chart --   JJOA(41)@     _________________________________________ Significant initial  Findings: Abnormal Labs Reviewed  COMPREHENSIVE METABOLIC PANEL - Abnormal; Notable for the following components:      Result Value   Sodium 120 (*)    Chloride 72 (*)    CO2 35 (*)    Glucose, Bld 197 (*)    Creatinine, Ser 1.47 (*)    Calcium 8.7 (*)    AST 48 (*)    GFR, Estimated 45 (*)    All other components within normal limits  CBC WITH DIFFERENTIAL/PLATELET - Abnormal; Notable for the following components:   WBC 11.3 (*)    RBC 3.62 (*)    Hemoglobin 10.4 (*)    HCT 33.5 (*)    Neutro Abs 8.9 (*)    Monocytes Absolute 1.5 (*)    Abs Immature Granulocytes 0.18 (*)    All other components within normal limits  BRAIN NATRIURETIC PEPTIDE - Abnormal; Notable for the following components:   B Natriuretic Peptide 467.0 (*)    All other components within normal limits  URINALYSIS, W/ REFLEX TO CULTURE (INFECTION SUSPECTED) - Abnormal; Notable for the following components:   APPearance CLOUDY (*)    Hgb urine dipstick LARGE (*)    Protein, ur 100 (*)    Nitrite POSITIVE (*)    Leukocytes,Ua LARGE (*)    Non Squamous Epithelial 0-5 (*)    All other components within normal limits  BLOOD GAS, VENOUS - Abnormal; Notable for the following components:   pCO2, Ven 90 (*)     pO2, Ven <31 (*)    Bicarbonate 43.3 (*)    Acid-Base Excess 12.6 (*)    All other components within normal limits  I-STAT CHEM 8, ED - Abnormal; Notable for the following components:   Sodium 117 (*)    Chloride 72 (*)    BUN 24 (*)    Creatinine, Ser 1.70 (*)    Glucose, Bld 188 (*)    Calcium, Ion 1.06 (*)    TCO2 39 (*)    Hemoglobin 10.9 (*)    HCT 32.0 (*)    All other components within normal limits  TROPONIN I (HIGH SENSITIVITY) - Abnormal; Notable for the following components:   Troponin I (High Sensitivity) 32 (*)    All other components within normal limits      _________________________ Troponin  ordered Cardiac Panel (last 3 results)  Recent Labs    10/07/22 2118  TROPONINIHS 32*     ECG: Ordered Personally reviewed and interpreted by me showing: HR : 82 Rhythm: Atrial fibrillation Ventricular premature complex Borderline right axis deviation Minimal ST depression, diffuse leads QTC 414  BNP (last 3 results) Recent Labs    08/11/22 0020 09/10/22 2046 10/07/22 2120  BNP 851.4* 472.8* 467.0*     COVID-19 Labs  No results for input(s): "DDIMER", "FERRITIN", "LDH", "CRP" in the last 72 hours.  Lab Results  Component Value Date   SARSCOV2NAA NEGATIVE 10/07/2022   SARSCOV2NAA NEGATIVE 07/19/2022   SARSCOV2NAA RESULT: NEGATIVE 11/18/2020   SARSCOV2NAA NEGATIVE 02/10/2020     The recent clinical data is shown below. Vitals:   10/07/22 2046 10/07/22 2115 10/07/22 2156 10/07/22 2214  BP:      Pulse: 97 90  87  Resp: (!) 22 20  (!) 21  Temp:   97.9 F (36.6 C)   TempSrc:   Rectal   SpO2: (!) 85% 100%      WBC     Component Value Date/Time   WBC 11.3 (H) 10/07/2022 2118   LYMPHSABS 0.7 10/07/2022 2118   MONOABS 1.5 (H) 10/07/2022 2118   EOSABS 0.0 10/07/2022 2118   BASOSABS 0.0 10/07/2022 2118     Procalcitonin   Ordered      UA   evidence of UTI vs colonization    Urine analysis:    Component Value Date/Time   COLORURINE  YELLOW 10/07/2022 2149   APPEARANCEUR CLOUDY (A) 10/07/2022 2149   LABSPEC 1.009 10/07/2022 2149   PHURINE 7.0 10/07/2022 2149   GLUCOSEU NEGATIVE 10/07/2022 2149   HGBUR LARGE (A) 10/07/2022 2149   BILIRUBINUR NEGATIVE 10/07/2022 2149   KETONESUR NEGATIVE 10/07/2022 2149   PROTEINUR 100 (A) 10/07/2022 2149   NITRITE POSITIVE (A) 10/07/2022 2149   LEUKOCYTESUR LARGE (A) 10/07/2022 2149    Results for orders placed or performed during the hospital encounter of 10/07/22  SARS Coronavirus 2 by RT PCR (hospital order, performed in [facility] hospital lab) *cepheid single result test* Anterior Nasal Swab     Status: None   Collection Time: 10/07/22  9:31 PM   Specimen: Anterior Nasal Swab  Result Value Ref Range Status   SARS Coronavirus 2 by RT PCR NEGATIVE NEGATIVE Final             Susceptibility data from last 90 days. Collected Specimen Info Organism LEVOFLOXACIN Trimethoprim/Sulfa  09/10/22 Urine, Random Stenotrophomonas maltophilia  R  R   Venous  Blood Gas result:  pH   7.29 Acid-Base Excess 12.6 High  mmol/L   pCO2, Ven 90 High Panic  mmHg  O2 Saturation 28 %  pO2, Ven <31 Low Panic  mmHg       ABG    Component Value Date/Time   PHART 7.328 (L) 08/02/2022 0502   PCO2ART 69.6 (HH) 08/02/2022 0502   PO2ART 73 (L) 08/02/2022 0502   HCO3 43.3 (H) 10/07/2022 2117   TCO2 39 (H) 10/07/2022 2132   O2SAT 28 10/07/2022 2117    __________________________________________________________ Recent Labs  Lab 10/07/22 2118 10/07/22 2132  NA 120* 117*  K 4.3 4.1  CO2 35*  --   GLUCOSE 197* 188*  BUN 21 24*  CREATININE 1.47* 1.70*  CALCIUM 8.7*  --     Cr   Up from baseline see below Lab Results  Component Value Date   CREATININE 1.70 (H) 10/07/2022   CREATININE 1.47 (H) 10/07/2022  CREATININE 1.21 09/11/2022    Recent Labs  Lab 10/07/22 2118  AST 48*  ALT 30  ALKPHOS 81  BILITOT 0.6  PROT 7.1  ALBUMIN 4.0   Lab Results  Component Value Date   CALCIUM  8.7 (L) 10/07/2022    Plt: Lab Results  Component Value Date   PLT 273 10/07/2022       Recent Labs  Lab 10/07/22 2118 10/07/22 2132  WBC 11.3*  --   NEUTROABS 8.9*  --   HGB 10.4* 10.9*  HCT 33.5* 32.0*  MCV 92.5  --   PLT 273  --     HG/HCT   stable,      Component Value Date/Time   HGB 10.9 (L) 10/07/2022 2132   HCT 32.0 (L) 10/07/2022 2132   MCV 92.5 10/07/2022 2118    _______________________________________________ Hospitalist was called for admission for   Hyponatremia  AKI  Respiratory distress, acute hypercapnic respiratory failure acute encephalopathy     The following Work up has been ordered so far:  Orders Placed This Encounter  Procedures   SARS Coronavirus 2 by RT PCR (hospital order, performed in [facility] hospital lab) *cepheid single result test* Anterior Nasal Swab   Urine Culture   CT Head Wo Contrast   DG Chest Portable 1 View   Comprehensive metabolic panel   CBC with Differential   Brain natriuretic peptide   Urinalysis, w/ Reflex to Culture (Infection Suspected) -Urine, Clean Catch   Blood gas, venous (at [facility] and [facility])   Osmolality   Osmolality, urine   Sodium, urine, random   Creatinine, urine, random   Blood gas, venous   Check Rectal Temperature   Do not attempt resuscitation (DNR)- Limited -Do Not Intubate (DNI)   Consult to hospitalist   Bipap   I-stat chem 8, ED (not at [facility], [facility] or [facility])   ED EKG     OTHER Significant initial  Findings:  labs showing:     DM  labs:  HbA1C: No results for input(s): "HGBA1C" in the last 8760 hours.     CBG (last 3)  No results for input(s): "GLUCAP" in the last 72 hours.     Cultures:    Component Value Date/Time   SDES  09/10/2022 1145    URINE, RANDOM Performed at [facility], [address] [place], [place] [address]    [facility]  09/10/2022 1145    NONE Reflexed from [id number] Performed at [facility], [address],  [place], [place] [address]    CULT >=100,000 COLONIES/mL STENOTROPHOMONAS MALTOPHILIA (A) 09/10/2022 1145   REPTSTATUS 09/12/2022 FINAL 09/10/2022 1145     Radiological Exams on Admission: CT Head Wo Contrast  Result Date: 10/07/2022 CLINICAL DATA:  Altered mental status.  History of dementia. EXAM: CT HEAD WITHOUT CONTRAST TECHNIQUE: Contiguous axial images were obtained from the base of the skull through the vertex without intravenous contrast. RADIATION DOSE REDUCTION: This exam was performed according to the departmental dose-optimization program which includes automated exposure control, adjustment of the mA and/or kV according to patient size and/or use of iterative reconstruction technique. COMPARISON:  Head CT dated 08/01/2022. FINDINGS: Evaluation of this exam is limited due to respiratory motion artifact. Brain: Moderate age-related atrophy and chronic microvascular ischemic changes. There is no acute intracranial hemorrhage. No mass effect or midline shift. No extra-axial fluid collection. Vascular: No hyperdense vessel or unexpected calcification. Skull: Normal. Negative for fracture or focal lesion. Sinuses/Orbits: The visualized paranasal sinuses and the  mastoid air cells are clear. Other: None IMPRESSION: 1. No acute intracranial pathology. 2. Moderate age-related atrophy and chronic microvascular ischemic changes. Electronically Signed   By: Elgie Collard M.D.   On: 10/07/2022 21:53   DG Chest Portable 1 View  Result Date: 10/07/2022 CLINICAL DATA:  Shortness of breath.  Altered mental status. EXAM: PORTABLE CHEST 1 VIEW COMPARISON:  09/10/2022 FINDINGS: Heart size and pulmonary vascularity are normal for technique. Probable emphysematous changes in the lungs. Slight interstitial pattern to the lung bases may represent mild edema or pneumonia. No pleural effusions. No pneumothorax. Mediastinal contours appear intact. Calcification of the aorta. IMPRESSION: 1. Emphysematous changes in the  lungs. 2. Slight interstitial pattern to the lung bases appears progressed since prior study, possibly indicating early edema or pneumonia. Electronically Signed   By: Burman Nieves M.D.   On: 10/07/2022 21:20   _______________________________________________________________________________________________________ Latest  Blood pressure 124/65, pulse 87, temperature 97.9 F (36.6 C), temperature source Rectal, resp. rate (!) 21, SpO2 100%.   Vitals  labs and radiology finding personally reviewed  Review of Systems:    Pertinent positives include:  confusion  Constitutional:  No weight loss, night sweats, Fevers, chills, fatigue, weight loss  HEENT:  No headaches, Difficulty swallowing,Tooth/dental problems,Sore throat,  No sneezing, itching, ear ache, nasal congestion, post nasal drip,  Cardio-vascular:  No chest pain, Orthopnea, PND, anasarca, dizziness, palpitations.no Bilateral lower extremity swelling  GI:  No heartburn, indigestion, abdominal pain, nausea, vomiting, diarrhea, change in bowel habits, loss of appetite, melena, blood in stool, hematemesis Resp:  no shortness of breath at rest. No dyspnea on exertion, No excess mucus, no productive cough, No non-productive cough, No coughing up of blood.No change in color of mucus.No wheezing. Skin:  no rash or lesions. No jaundice GU:  no dysuria, change in color of urine, no urgency or frequency. No straining to urinate.  No flank pain.  Musculoskeletal:  No joint pain or no joint swelling. No decreased range of motion. No back pain.  Psych:  No change in mood or affect. No depression or anxiety. No memory loss.  Neuro: no localizing neurological complaints, no tingling, no weakness, no double vision, no gait abnormality, no slurred speech, no   All systems reviewed and apart from HOPI all are negative _______________________________________________________________________________________________ Past Medical History:   Past  Medical History:  Diagnosis Date   Acute duodenal ulcer with bleeding 01/30/2014   Acute post-hemorrhagic anemia 01/30/2014   Anxiety    Aortic atherosclerosis (HCC) 02/10/2020   Arthritis    Basal cell carcinoma    Cancer (HCC)    bladder   Chronic kidney disease    COPD exacerbation (HCC) 02/10/2020   Coronary artery disease    Dyspnea    Dyspnea on exertion 02/10/2020   Emphysema of lung (HCC)    Essential hypertension 02/10/2020   GERD (gastroesophageal reflux disease) 02/10/2020   GI bleed 01/30/2014   Hyperlipidemia 02/10/2020     Past Surgical History:  Procedure Laterality Date   BLADDER SURGERY     CATARACT EXTRACTION W/ INTRAOCULAR LENS IMPLANT     CYSTOSCOPY W/ RETROGRADES Bilateral 11/20/2020   Procedure: CYSTOSCOPY WITH RETROGRADE PYELOGRAM;  Surgeon: Marcine Matar, MD;  Location: [facility];  Service: Urology;  Laterality: Bilateral;   CYSTOSCOPY W/ RETROGRADES Right 02/12/2021   Procedure: CYSTOSCOPY WITH RETROGRADE PYELOGRAM;  Surgeon: Marcine Matar, MD;  Location: [facility];  Service: Urology;  Laterality: Right;   CYSTOSCOPY W/ RETROGRADES Bilateral 06/25/2021   Procedure: CYSTOSCOPY WITH  RETROGRADE PYELOGRAM;  Surgeon: Marcine Matar, MD;  Location: [facility];  Service: Urology;  Laterality: Bilateral;   ESOPHAGOGASTRODUODENOSCOPY N/A 01/30/2014   Procedure: ESOPHAGOGASTRODUODENOSCOPY (EGD);  Surgeon: Louis Meckel, MD;  Location: [facility];  Service: Endoscopy;  Laterality: N/A;   TONSILLECTOMY     TRANSURETHRAL RESECTION OF BLADDER TUMOR N/A 02/12/2021   Procedure: REPEAT TRANSURETHRAL RESECTION OF BLADDER TUMOR (TURBT);  Surgeon: Marcine Matar, MD;  Location: [facility];  Service: Urology;  Laterality: N/A;   TRANSURETHRAL RESECTION OF BLADDER TUMOR WITH MITOMYCIN-C N/A 11/20/2020   Procedure: TRANSURETHRAL RESECTION OF BLADDER TUMOR;  Surgeon: Marcine Matar, MD;  Location: [facility];  Service: Urology;  Laterality: N/A;   TRANSURETHRAL  RESECTION OF BLADDER TUMOR WITH MITOMYCIN-C N/A 06/25/2021   Procedure: TRANSURETHRAL RESECTION OF BLADDER TUMOR WITH GEMCITABINE;  Surgeon: Marcine Matar, MD;  Location: [facility];  Service: Urology;  Laterality: N/A;  1 HR   TRANSURETHRAL RESECTION OF BLADDER TUMOR WITH MITOMYCIN-C N/A 12/10/2021   Procedure: TRANSURETHRAL RESECTION OF BLADDER TUMOR WITH GEMCITABINE;  Surgeon: Marcine Matar, MD;  Location: [facility];  Service: Urology;  Laterality: N/A;  1 HR   TRANSURETHRAL RESECTION OF BLADDER TUMOR WITH MITOMYCIN-C N/A 05/27/2022   Procedure: TRANSURETHRAL RESECTION OF BLADDER TUMOR;  Surgeon: Marcine Matar, MD;  Location: [facility];  Service: Urology;  Laterality: N/A;    Social History:  Ambulatory   walker       reports that he quit smoking about 30 years ago. His smoking use included cigarettes. He started smoking about 77 years ago. He has never used smokeless tobacco. He reports that he does not currently use alcohol. He reports that he does not use drugs.     Family History:   Family History  Problem Relation Age of Onset   Hypertension Mother    Stroke Father    ______________________________________________________________________________________________ Allergies: Allergies  Allergen Reactions   Prednisone Other (See Comments)    Unknown reaction     Prior to Admission medications   Medication Sig Start Date End Date Taking? Authorizing Provider  albuterol (PROVENTIL) (2.5 MG/3ML) 0.083% nebulizer solution Take 3 mLs (2.5 mg total) by nebulization every 6 (six) hours as needed for wheezing or shortness of breath. Chronic hypercapneic respiratory failure and COPD J96.12, J44.9 08/17/22   Russella Dar, NP  ascorbic acid (VITAMIN C) 500 MG tablet Take 500 mg by mouth daily.    [provider]  CRANBERRY PO Take 1 tablet by mouth daily with breakfast.    [provider]  escitalopram (LEXAPRO) 10 MG tablet Take 10 mg by mouth at bedtime.     [provider]  feeding supplement (ENSURE ENLIVE / ENSURE PLUS) LIQD Take 237 mLs by mouth 2 (two) times daily between meals. Patient not taking: Reported on 09/10/2022 07/27/22   Lanae Boast, MD  furosemide (LASIX) 40 MG tablet Take 1 tablet (40 mg total) by mouth daily. Patient taking differently: Take 40 mg by mouth in the morning. 08/15/22   Zannie Cove, MD  loratadine (CLARITIN) 10 MG tablet Take 10 mg by mouth daily.    [provider]  metoprolol succinate (TOPROL-XL) 25 MG 24 hr tablet Take 2 tablets (50 mg total) by mouth daily. Patient taking differently: Take 25 mg by mouth See admin instructions. Take 25 mg by mouth in the morning ONLY if the Systolic number is greater than 110 and the Diastolic number is greater than 60 08/15/22   Zannie Cove, MD  Multiple Vitamin (MULTIVITAMIN WITH  MINERALS) TABS tablet Take 1 tablet by mouth daily with breakfast.    [provider]  OXYGEN Inhale 3 L/min into the lungs continuous.    [provider]  pantoprazole (PROTONIX) 40 MG tablet Take 1 tablet (40 mg total) by mouth 2 (two) times daily before a meal. 08/06/22   Jonah Blue, MD  polyethylene glycol (MIRALAX / GLYCOLAX) 17 g packet Take 17 g by mouth daily as needed for mild constipation. 07/27/22   Lanae Boast, MD  potassium chloride SA (KLOR-CON M) 20 MEQ tablet Take 1 tablet (20 mEq total) by mouth daily. Patient taking differently: Take 20 mEq by mouth in the morning. 08/15/22   Zannie Cove, MD  rosuvastatin (CRESTOR) 20 MG tablet Take 20 mg by mouth daily.    [provider]  saccharomyces boulardii (FLORASTOR) 250 MG capsule Take 1 capsule (250 mg total) by mouth 2 (two) times daily. 07/05/21   Leatha Gilding, MD  tamsulosin (FLOMAX) 0.4 MG CAPS capsule Take 1 capsule (0.4 mg total) by mouth daily. Patient taking differently: Take 0.4 mg by mouth in the morning. 08/06/22   Jonah Blue, MD  umeclidinium-vilanterol [facility] ELLIPTA)  62.5-25 MCG/ACT AEPB Inhale 1 puff into the lungs daily. Patient taking differently: Inhale 1 puff into the lungs See admin instructions. Inhale 1 puff into the lungs in the morning only if the albuterol in the nebulizer has not yet been used 08/25/22   Luciano Cutter, MD    ___________________________________________________________________________________________________ Physical Exam:    10/07/2022   10:14 PM 10/07/2022    9:15 PM 10/07/2022    8:46 PM  Vitals with BMI  Pulse 87 90 97     1. General:  in No  Acute distress on BIPAP   Chronically ill   -appearing 2. Psychological:poorly responsive 3. Head/ENT:    Dry Mucous Membranes                          Head Non traumatic, neck supple                          Poor Dentition 4. SKIN: no decreased Skin turgor,  Skin clean Dry and intact no rash    5. Heart: Regular rate and rhythm no  Murmur, no Rub or gallop 6. Lungs  no wheezes or crackles  on BIPAP 7. Abdomen: Soft,  non-tender, Non distended  bowel sounds present 8. Lower extremities: no clubbing, cyanosis, no  edema 9. Neurologically Grossly intact, moving all 4 extremities equally   10. MSK: Normal range of motion    Chart has been reviewed  ______________________________________________________________________________________________  Assessment/Plan  89 y.o. male with medical history significant of CHF, chronic Foley catheter, chronic hypoxemic and hypercapnic respiratory failure due to COPD on 2-3 L Fio2 combined systolic and diastolic heart failure with known ejection fraction 40 to 45%, hypertension, hyperlipidemia, CKD stage IIIa and atrial fibrillation not on Eliquis due to recurrent hematuria  Hyponatremia, history of C. difficile in the past. dementia    Admitted for   Hyponatremia  AKI acute on chronic respiratory failure    Present on Admission:  Acute respiratory failure (HCC)  Complicated UTI (urinary tract infection)  Acute metabolic  encephalopathy  Chronic systolic CHF preserved EF 40 to 45% (congestive heart failure) (HCC)  Hyponatremia  COPD (chronic obstructive pulmonary disease) (HCC)  CAP (community acquired pneumonia)  Malignant neoplasm of overlapping sites of bladder (  HCC)  AKI (acute kidney injury) (HCC)  Protein-calorie malnutrition, moderate (HCC)  Acute on chronic respiratory failure with hypoxia and hypercapnia (HCC)  Chronic anemia     Complicated UTI (urinary tract infection) ID felt that this was more colonization UA showing no evidence of bacteria White and red blood cells in the setting of significant hematuria after his Foley catheter was recently exchange  Acute metabolic encephalopathy Recurrent issue in the setting of hypercapnia. Currently on BiPAP will repeat ABG and adjust BiPAP as needed.  Discussed with family at length we will not escalate care from now Continue with current interventions with BiPAP as needed DO NOT INTUBATE patient would benefit from palliative care consult. CT head unremarkable Discussed possibility get MRI with family but this is unlikely to be beneficial patient does not tolerate anticoagulation or aspirin secondary to hematuria and fall risk Would unlikely to tolerate MRI given confusion and significant dementia.  Family is comfortable to hold off on MRI at this time  Chronic systolic CHF preserved EF 40 to 45% (congestive heart failure) (HCC) Although initially patient was given a dose of Lasix his blood pressure went down there after clinically he does not appear to be fluid overloaded BNP is actually stable Chest x-ray indeterminant No peripheral edema Continue to monitor fluid status hold off on further Lasix doses if anything given hypotension will gently rehydrate now  Hyponatremia Chronic recurrent obtain your electrolytes patient has responded in the past for gentle rehydration He appears to to have decreased p.o. intake and appears dry with AKI.   Continue to monitor serial Bmet ordered Given chronic hyponatremia without avoid aggressive overcorrection  COPD (chronic obstructive pulmonary disease) (HCC) No wheezing or history of cough Chest x-ray did show possibility of infiltrates Continue home medications albuterol as needed scheduled DuoNeb hold off on steroids for right now continue to monitor  CAP (community acquired pneumonia) Chest x-ray showed possible infiltrate.  For tonight covered with Rocephin and azithromycin Check procalcitonin and adjust antibiotics if needed Check strep pneumo  Malignant neoplasm of overlapping sites of bladder (HCC) Chronic with recurrent hematuria status post Foley catheter continue to monitor  AKI (acute kidney injury) (HCC) Obtain electrolytes gently rehydrate patient noted to have hypotension after Lasix given will hold appears to be more on the dry side  Protein-calorie malnutrition, moderate (HCC) Obtain nutritional consult check prealbumin  Acute on chronic respiratory failure with hypoxia and hypercapnia (HCC)  this patient has acute respiratory failure with Hypoxia and  Hypercarbia as documented by the presence of following: O2 saturatio< 90% on 2L   pH <7.35 with pCO2 >50   Likely due to:   Pneumonia,  Provide O2 therapy and titrate as needed  Continuous pulse ox   check Pulse ox with ambulation prior to discharge     Chronic anemia Obtain anemia panel  Transfuse for Hg <7 , rapidly dropping or  if symptomatic    Other plan as per orders.  DVT prophylaxis:  SCD       Code Status:    Code Status: Limited: Do not attempt resuscitation (DNR) -DNR-LIMITED -Do Not Intubate/DNI   DNR/DNI  as per  family  I had personally discussed CODE STATUS with patient and family  ACP  has been reviewed     Family Communication:   Family  at  Bedside  plan of care was discussed   with   Son,  Wife,    Diet npo   Disposition Plan:  To home once workup is complete and patient is  stable   Following barriers for discharge:                             Need for BIPAP resolves      Consult Orders  (From admission, onward)           Start     Ordered   10/07/22 2226  Consult to hospitalist  Once       Provider:  (Not yet assigned)  Question Answer Comment  Place call to: [facility]   Reason for Consult Admit      10/07/22 2225                               Would benefit from PT/OT eval prior to DC  Ordered                   Swallow eval - SLP ordered                                      Transition of care consulted                   Nutrition    consulted                  Palliative care consult Consults called: none   Admission status:  ED Disposition     ED Disposition  Admit   Condition  --   Comment  The patient appears reasonably stabilized for admission considering the current resources, flow, and capabilities available in the ED at this time, and I doubt any other [facility] requiring further screening and/or treatment in the ED prior to admission is  present.            inpatient     I Expect 2 midnight stay secondary to severity of patient's current illness need for inpatient interventions justified by the following:  hemodynamic instability despite optimal treatment ( hypoxia, hypercapnia)   Severe lab/radiological/exam abnormalities including:   Hypercapnia and extensive comorbidities including:    CHF    COPD/asthma    CKD  dementia    That are currently affecting medical management.   I expect  patient to be hospitalized for 2 midnights requiring inpatient medical care.  Patient is at high risk for adverse outcome (such as loss of life or disability) if not treated.  Indication for inpatient stay as follows:  Severe change from baseline regarding mental status   inability to maintain oral hydration    New or worsening hypoxia   Need for IV antibiotics, IV fluids,  need for biPAP    Level of care   stepdown   tele indefinitely please discontinue once patient no longer qualifies COVID-19 Labs    Lab Results  Component Value Date   SARSCOV2NAA NEGATIVE 10/07/2022     Precautions: admitted as   Covid Negative    Annalei Friesz 10/08/2022, 12:41 AM    [facility]     after 2 AM please page floor coverage PA If 7AM-7PM, please contact the day team taking care of the patient using [email or web address]

## 2022-10-07 NOTE — ED Triage Notes (Signed)
Pt from home altered mental status more than normal,hx dementia, pt family called, more agitated as well, per EMS, pt had foley changed by family, family reported to EMS that foley normally bloody after being changed, pt alert but not orientated at this time.

## 2022-10-07 NOTE — ED Provider Notes (Signed)
[place] EMERGENCY DEPARTMENT AT [facility] Provider Note   CSN: [id number] Arrival date & time: 10/07/22  2029     History  Chief Complaint  Patient presents with   Altered Mental Status    Pt from home altered mental status more than normal,hx dementia, pt family called, more agitated as well, per EMS, pt had foley changed by family, family reported to EMS that foley normally bloody after being changed, pt alert but not orientated at this time.     Gerald Valdez is a 89 y.o. male afib, aflutter, bladder cancer, COPD, CHF, CKD 3a that p/w AMS.   Per wife and son, pt was being more agitated today. He was yelling more than usual. Today ~7pm, wife noticed he was not responding and eyes rolled back. EMS was called. At baseline, he mumbles but is not understandable.   Wife confirms that he is DNR/DNI.   Per EMS, family reports that pt has been having worsening AMS and agitation for the past 2 days. Reports that he had foley changed yesterday, and he normally has bloody urine right after cath is changed. Reports that pt normally has a leftward gaze. Reports he normally has occasional body jerks. Reports he is on 2L [place] chronically.   Recently admitted 8/2 for UTI and acute on chronic hypovolemic hyponatremia. Was admitted 7/3 for CHF exac requiring Bipap.   Altered Mental Status     Home Medications Prior to Admission medications   Medication Sig Start Date End Date Taking? Authorizing Provider  albuterol (PROVENTIL) (2.5 MG/3ML) 0.083% nebulizer solution Take 3 mLs (2.5 mg total) by nebulization every 6 (six) hours as needed for wheezing or shortness of breath. Chronic hypercapneic respiratory failure and COPD J96.12, J44.9 08/17/22   Russella Dar, NP  ascorbic acid (VITAMIN C) 500 MG tablet Take 500 mg by mouth daily.    [provider]  CRANBERRY PO Take 1 tablet by mouth daily with breakfast.    [provider]  escitalopram (LEXAPRO) 10 MG  tablet Take 10 mg by mouth at bedtime.    [provider]  feeding supplement (ENSURE ENLIVE / ENSURE PLUS) LIQD Take 237 mLs by mouth 2 (two) times daily between meals. Patient not taking: Reported on 09/10/2022 07/27/22   Lanae Boast, MD  furosemide (LASIX) 40 MG tablet Take 1 tablet (40 mg total) by mouth daily. Patient taking differently: Take 40 mg by mouth in the morning. 08/15/22   Zannie Cove, MD  loratadine (CLARITIN) 10 MG tablet Take 10 mg by mouth daily.    [provider]  metoprolol succinate (TOPROL-XL) 25 MG 24 hr tablet Take 2 tablets (50 mg total) by mouth daily. Patient taking differently: Take 25 mg by mouth See admin instructions. Take 25 mg by mouth in the morning ONLY if the Systolic number is greater than 110 and the Diastolic number is greater than 60 08/15/22   Zannie Cove, MD  Multiple Vitamin (MULTIVITAMIN WITH MINERALS) TABS tablet Take 1 tablet by mouth daily with breakfast.    [provider]  OXYGEN Inhale 3 L/min into the lungs continuous.    [provider]  pantoprazole (PROTONIX) 40 MG tablet Take 1 tablet (40 mg total) by mouth 2 (two) times daily before a meal. 08/06/22   Jonah Blue, MD  polyethylene glycol (MIRALAX / GLYCOLAX) 17 g packet Take 17 g by mouth daily as needed for mild constipation. 07/27/22   Lanae Boast, MD  potassium chloride SA (  KLOR-CON M) 20 MEQ tablet Take 1 tablet (20 mEq total) by mouth daily. Patient taking differently: Take 20 mEq by mouth in the morning. 08/15/22   Zannie Cove, MD  rosuvastatin (CRESTOR) 20 MG tablet Take 20 mg by mouth daily.    [provider]  saccharomyces boulardii (FLORASTOR) 250 MG capsule Take 1 capsule (250 mg total) by mouth 2 (two) times daily. 07/05/21   Leatha Gilding, MD  tamsulosin (FLOMAX) 0.4 MG CAPS capsule Take 1 capsule (0.4 mg total) by mouth daily. Patient taking differently: Take 0.4 mg by mouth in the morning. 08/06/22   Jonah Blue, MD   umeclidinium-vilanterol [facility] ELLIPTA) 62.5-25 MCG/ACT AEPB Inhale 1 puff into the lungs daily. Patient taking differently: Inhale 1 puff into the lungs See admin instructions. Inhale 1 puff into the lungs in the morning only if the albuterol in the nebulizer has not yet been used 08/25/22   Luciano Cutter, MD      Allergies    Prednisone    Review of Systems   Review of Systems  Physical Exam Updated Vital Signs BP 124/65   Pulse 87   Temp 97.9 F (36.6 C) (Rectal) Comment: taken by RN Ariel  Resp (!) 21   SpO2 100%  Physical Exam Gen: chronically ill appearing older man laying in bed. Occasionally body jerking movements. HEENT: NCAT. PERRLA. Neuro: Alert to voice. Not oriented. Eyes do not track. CV: Irregular rhythm, regular rate. Resp: Transmitted upper airway sounds. No crackles or wheezing noted.  Abm: Soft, nontender, nondistended. Normal BS  ED Results / Procedures / Treatments   Labs (all labs ordered are listed, but only abnormal results are displayed) Labs Reviewed  COMPREHENSIVE METABOLIC PANEL - Abnormal; Notable for the following components:      Result Value   Sodium 120 (*)    Chloride 72 (*)    CO2 35 (*)    Glucose, Bld 197 (*)    Creatinine, Ser 1.47 (*)    Calcium 8.7 (*)    AST 48 (*)    GFR, Estimated 45 (*)    All other components within normal limits  CBC WITH DIFFERENTIAL/PLATELET - Abnormal; Notable for the following components:   WBC 11.3 (*)    RBC 3.62 (*)    Hemoglobin 10.4 (*)    HCT 33.5 (*)    Neutro Abs 8.9 (*)    Monocytes Absolute 1.5 (*)    Abs Immature Granulocytes 0.18 (*)    All other components within normal limits  BRAIN NATRIURETIC PEPTIDE - Abnormal; Notable for the following components:   B Natriuretic Peptide 467.0 (*)    All other components within normal limits  URINALYSIS, W/ REFLEX TO CULTURE (INFECTION SUSPECTED) - Abnormal; Notable for the following components:   APPearance CLOUDY (*)    Hgb urine dipstick  LARGE (*)    Protein, ur 100 (*)    Nitrite POSITIVE (*)    Leukocytes,Ua LARGE (*)    Non Squamous Epithelial 0-5 (*)    All other components within normal limits  BLOOD GAS, VENOUS - Abnormal; Notable for the following components:   pCO2, Ven 90 (*)    pO2, Ven <31 (*)    Bicarbonate 43.3 (*)    Acid-Base Excess 12.6 (*)    All other components within normal limits  I-STAT CHEM 8, ED - Abnormal; Notable for the following components:   Sodium 117 (*)    Chloride 72 (*)    BUN 24 (*)  Creatinine, Ser 1.70 (*)    Glucose, Bld 188 (*)    Calcium, Ion 1.06 (*)    TCO2 39 (*)    Hemoglobin 10.9 (*)    HCT 32.0 (*)    All other components within normal limits  TROPONIN I (HIGH SENSITIVITY) - Abnormal; Notable for the following components:   Troponin I (High Sensitivity) 32 (*)    All other components within normal limits  SARS CORONAVIRUS 2 BY RT PCR  URINE CULTURE  SODIUM, URINE, RANDOM  CREATININE, URINE, RANDOM  OSMOLALITY  OSMOLALITY, URINE  BLOOD GAS, VENOUS  TROPONIN I (HIGH SENSITIVITY)    EKG EKG Interpretation Date/Time:  Thursday October 07 2022 21:14:08 EDT Ventricular Rate:  82 PR Interval:    QRS Duration:  85 QT Interval:  354 QTC Calculation: 414 R Axis:   82  Text Interpretation: Atrial fibrillation Ventricular premature complex Borderline right axis deviation Minimal ST depression, diffuse leads No significant change since last tracing Confirmed by Richardean Canal ([id number]) on 10/07/2022 9:45:29 PM  Radiology CT Head Wo Contrast  Result Date: 10/07/2022 CLINICAL DATA:  Altered mental status.  History of dementia. EXAM: CT HEAD WITHOUT CONTRAST TECHNIQUE: Contiguous axial images were obtained from the base of the skull through the vertex without intravenous contrast. RADIATION DOSE REDUCTION: This exam was performed according to the departmental dose-optimization program which includes automated exposure control, adjustment of the mA and/or kV according to  patient size and/or use of iterative reconstruction technique. COMPARISON:  Head CT dated 08/01/2022. FINDINGS: Evaluation of this exam is limited due to respiratory motion artifact. Brain: Moderate age-related atrophy and chronic microvascular ischemic changes. There is no acute intracranial hemorrhage. No mass effect or midline shift. No extra-axial fluid collection. Vascular: No hyperdense vessel or unexpected calcification. Skull: Normal. Negative for fracture or focal lesion. Sinuses/Orbits: The visualized paranasal sinuses and the mastoid air cells are clear. Other: None IMPRESSION: 1. No acute intracranial pathology. 2. Moderate age-related atrophy and chronic microvascular ischemic changes. Electronically Signed   By: Elgie Collard M.D.   On: 10/07/2022 21:53   DG Chest Portable 1 View  Result Date: 10/07/2022 CLINICAL DATA:  Shortness of breath.  Altered mental status. EXAM: PORTABLE CHEST 1 VIEW COMPARISON:  09/10/2022 FINDINGS: Heart size and pulmonary vascularity are normal for technique. Probable emphysematous changes in the lungs. Slight interstitial pattern to the lung bases may represent mild edema or pneumonia. No pleural effusions. No pneumothorax. Mediastinal contours appear intact. Calcification of the aorta. IMPRESSION: 1. Emphysematous changes in the lungs. 2. Slight interstitial pattern to the lung bases appears progressed since prior study, possibly indicating early edema or pneumonia. Electronically Signed   By: Burman Nieves M.D.   On: 10/07/2022 21:20    Procedures Procedures   Medications Ordered in ED Medications  methylPREDNISolone sodium succinate (SOLU-MEDROL) 125 mg/2 mL injection 125 mg (125 mg Intravenous Given 10/07/22 2227)  albuterol (PROVENTIL) (2.5 MG/3ML) 0.083% nebulizer solution (15 mg/hr Nebulization Given 10/07/22 2220)  ipratropium (ATROVENT) nebulizer solution 1 mg (1 mg Nebulization Given 10/07/22 2220)  furosemide (LASIX) injection 40 mg (40 mg  Intravenous Given 10/07/22 2227)    ED Course/ Medical Decision Making/ A&P                                Medical Decision Making:   JILES JACOX is a 89 y.o. male who presented to the ED today with AMS detailed above.  Handoff received from EMS.  Additional history discussed with patient's family/caregivers.  Complete initial physical exam performed.   Reviewed and confirmed nursing documentation for past medical history, family history, social history.    Initial Assessment:   With the patient's presentation of AMS, differential includes worsening dementia, electrolyte abnormalities, CVA, arrhythmias, metabolic disturbance 2/2 infection.   This is most consistent with an acute complicated illness  Initial Plan:  CT head to workup CVA. BNP VBG [place]  Screening labs including CBC and Metabolic panel to evaluate for infectious or metabolic etiology of disease.  Urinalysis with reflex culture ordered to evaluate for UTI or relevant urologic/nephrologic pathology.  CXR to evaluate for structural/infectious intrathoracic pathology.  EKG to evaluate for cardiac pathology Objective evaluation as below reviewed   Initial Study Results:   Laboratory  - VBG showed pH 7.29, pCO2 90, PO2 <31 - Na 120 - BNP 467 - Leukocytosis and neutrophilia on CBC - UA c/f infxn  EKG EKG was reviewed independently. Rate, rhythm, axis, intervals all examined and without medically relevant abnormality. ST segments without concerns for elevations.    Radiology:  All images reviewed independently. Agree with radiology report at this time.   CT Head Wo Contrast  Result Date: 10/07/2022 CLINICAL DATA:  Altered mental status.  History of dementia. EXAM: CT HEAD WITHOUT CONTRAST TECHNIQUE: Contiguous axial images were obtained from the base of the skull through the vertex without intravenous contrast. RADIATION DOSE REDUCTION: This exam was performed according to the departmental dose-optimization  program which includes automated exposure control, adjustment of the mA and/or kV according to patient size and/or use of iterative reconstruction technique. COMPARISON:  Head CT dated 08/01/2022. FINDINGS: Evaluation of this exam is limited due to respiratory motion artifact. Brain: Moderate age-related atrophy and chronic microvascular ischemic changes. There is no acute intracranial hemorrhage. No mass effect or midline shift. No extra-axial fluid collection. Vascular: No hyperdense vessel or unexpected calcification. Skull: Normal. Negative for fracture or focal lesion. Sinuses/Orbits: The visualized paranasal sinuses and the mastoid air cells are clear. Other: None IMPRESSION: 1. No acute intracranial pathology. 2. Moderate age-related atrophy and chronic microvascular ischemic changes. Electronically Signed   By: Elgie Collard M.D.   On: 10/07/2022 21:53   DG Chest Portable 1 View  Result Date: 10/07/2022 CLINICAL DATA:  Shortness of breath.  Altered mental status. EXAM: PORTABLE CHEST 1 VIEW COMPARISON:  09/10/2022 FINDINGS: Heart size and pulmonary vascularity are normal for technique. Probable emphysematous changes in the lungs. Slight interstitial pattern to the lung bases may represent mild edema or pneumonia. No pleural effusions. No pneumothorax. Mediastinal contours appear intact. Calcification of the aorta. IMPRESSION: 1. Emphysematous changes in the lungs. 2. Slight interstitial pattern to the lung bases appears progressed since prior study, possibly indicating early edema or pneumonia. Electronically Signed   By: Burman Nieves M.D.   On: 10/07/2022 21:20   DG Hip Unilat W or Wo Pelvis 2-3 Views Left  Result Date: 09/10/2022 CLINICAL DATA:  bruising EXAM: DG HIP (WITH OR WITHOUT PELVIS) 2-3V LEFT; LEFT FEMUR 1 VIEW COMPARISON:  CT scan abdomen and pelvis from 08/01/2022. FINDINGS: No acute fracture or dislocation. No aggressive osseous lesion. Visualized sacral arcuate lines are  unremarkable. Unremarkable sacro-iliac joint. There are moderate to severe degenerative changes of the left hip joint in the form of reduced joint space, subchondral sclerosis / cystic changes and osteophytosis. There are mild degenerative changes of the right hip joint with mild joint space narrowing. Osteophytosis of the  superior acetabulum. No radiopaque foreign bodies. IMPRESSION: 1. No acute osseous abnormality of the pelvis, left hip or left femur. 2. Moderate to severe left and mild right hip osteoarthritis. Electronically Signed   By: Jules Schick M.D.   On: 09/10/2022 13:40   DG Femur 1 View Left  Result Date: 09/10/2022 CLINICAL DATA:  bruising EXAM: DG HIP (WITH OR WITHOUT PELVIS) 2-3V LEFT; LEFT FEMUR 1 VIEW COMPARISON:  CT scan abdomen and pelvis from 08/01/2022. FINDINGS: No acute fracture or dislocation. No aggressive osseous lesion. Visualized sacral arcuate lines are unremarkable. Unremarkable sacro-iliac joint. There are moderate to severe degenerative changes of the left hip joint in the form of reduced joint space, subchondral sclerosis / cystic changes and osteophytosis. There are mild degenerative changes of the right hip joint with mild joint space narrowing. Osteophytosis of the superior acetabulum. No radiopaque foreign bodies. IMPRESSION: 1. No acute osseous abnormality of the pelvis, left hip or left femur. 2. Moderate to severe left and mild right hip osteoarthritis. Electronically Signed   By: Jules Schick M.D.   On: 09/10/2022 13:40   DG Chest Port 1 View  Result Date: 09/10/2022 CLINICAL DATA:  Sepsis EXAM: PORTABLE CHEST 1 VIEW COMPARISON:  CXR 08/11/22 FINDINGS: No pleural effusion. No pneumothorax. No focal opacity. Normal cardiac and mediastinal contours. No radiographically apparent displaced rib fractures. Visualized upper abdomen unremarkable. IMPRESSION: No focal airspace opacity Electronically Signed   By: Lorenza Cambridge M.D.   On: 09/10/2022 12:29    Reassessment and  Plan:   - Given hypercapnia and hypoxia, pt will need Bipap. Will also give albuterol and atrovent nebs. Will give solumedrol IV.  - BNP elevated and CXR showed edema vs PNA. Suspect pt has combination of CHF exac and COPD exac. Will give IV lasix for fluid overload.  - Discussed case w/ hospitalist. They agree to see pt.  - Family is agreeable to admission. - Discussed risk of aspiration on Bipap, family expressed understanding of this risk.   Final Clinical Impression(s) / ED Diagnoses Final diagnoses:  Hyponatremia  AKI (acute kidney injury) (HCC)  Respiratory distress  Acute on chronic systolic congestive heart failure [facility])    Rx / DC Orders ED Discharge Orders     None         Lincoln Brigham, MD 10/07/22 2310    Charlynne Pander, MD 10/07/22 [phone]

## 2022-10-07 NOTE — Subjective & Objective (Signed)
Pt is more adjitated, pt has hx of indwelling foley ,dementia  At 7 PM he was not responsive  EMS was called  At baseline mumbles  Recent admit for UTI and CHF needing Bipap  Chronic hyponatremia but seems worse today Hyper capneic started on BIPAP  Of note his foley has been changed yesterday and had some blood in it

## 2022-10-07 NOTE — Progress Notes (Signed)
   10/07/22 2214  BiPAP/CPAP/SIPAP  $ Non-Invasive Ventilator  Non-Invasive Vent Initial  $ Face Mask Large  Yes  BiPAP/CPAP/SIPAP Pt Type Adult  BiPAP/CPAP/SIPAP V60  Mask Type Full face mask  Mask Size Large  Set Rate 18 breaths/min  Respiratory Rate 21 breaths/min  IPAP 18 cmH20  EPAP 6 cmH2O  FiO2 (%) 50 %  Minute Ventilation 10.5  Leak 4  Peak Inspiratory Pressure (PIP) 18  Tidal Volume (Vt) 619  Patient Home Equipment No  Auto Titrate No  Press High Alarm 30 cmH2O  Press Low Alarm 5 cmH2O  BiPAP/CPAP /SiPAP Vitals  Pulse Rate 87  Resp (!) 21  Bilateral Breath Sounds Diminished;Coarse crackles  MEWS Score/Color  MEWS Score 1  MEWS Score Color Gerald Valdez

## 2022-10-08 DIAGNOSIS — N179 Acute kidney failure, unspecified: Secondary | ICD-10-CM | POA: Diagnosis not present

## 2022-10-08 DIAGNOSIS — J9601 Acute respiratory failure with hypoxia: Secondary | ICD-10-CM

## 2022-10-08 DIAGNOSIS — J189 Pneumonia, unspecified organism: Secondary | ICD-10-CM | POA: Diagnosis present

## 2022-10-08 DIAGNOSIS — N390 Urinary tract infection, site not specified: Secondary | ICD-10-CM | POA: Diagnosis not present

## 2022-10-08 DIAGNOSIS — Z66 Do not resuscitate: Secondary | ICD-10-CM

## 2022-10-08 DIAGNOSIS — I5023 Acute on chronic systolic (congestive) heart failure: Secondary | ICD-10-CM

## 2022-10-08 DIAGNOSIS — Z7189 Other specified counseling: Secondary | ICD-10-CM

## 2022-10-08 DIAGNOSIS — J9602 Acute respiratory failure with hypercapnia: Secondary | ICD-10-CM

## 2022-10-08 DIAGNOSIS — Z515 Encounter for palliative care: Secondary | ICD-10-CM

## 2022-10-08 DIAGNOSIS — R4589 Other symptoms and signs involving emotional state: Secondary | ICD-10-CM

## 2022-10-08 LAB — BLOOD GAS, VENOUS
Acid-Base Excess: 10.4 mmol/L — ABNORMAL HIGH (ref 0.0–2.0)
Acid-Base Excess: 10.6 mmol/L — ABNORMAL HIGH (ref 0.0–2.0)
Acid-Base Excess: 12.8 mmol/L — ABNORMAL HIGH (ref 0.0–2.0)
Bicarbonate: 40.3 mmol/L — ABNORMAL HIGH (ref 20.0–28.0)
Bicarbonate: 42.3 mmol/L — ABNORMAL HIGH (ref 20.0–28.0)
Bicarbonate: 43.3 mmol/L — ABNORMAL HIGH (ref 20.0–28.0)
O2 Saturation: 29.5 %
O2 Saturation: 42.9 %
O2 Saturation: 57.4 %
Patient temperature: 36.6
Patient temperature: 37
Patient temperature: 37
pCO2, Ven: 101 mmHg (ref 44–60)
pCO2, Ven: 78 mmHg (ref 44–60)
pCO2, Ven: 88 mmHg (ref 44–60)
pH, Ven: 7.23 — ABNORMAL LOW (ref 7.25–7.43)
pH, Ven: 7.3 (ref 7.25–7.43)
pH, Ven: 7.32 (ref 7.25–7.43)
pO2, Ven: 34 mmHg (ref 32–45)
pO2, Ven: <31 mmHg — CL (ref 32–45)
pO2, Ven: <31 mmHg — CL (ref 32–45)

## 2022-10-08 LAB — BASIC METABOLIC PANEL
Anion gap: 13 (ref 5–15)
Anion gap: 12 (ref 5–15)
Anion gap: 10 (ref 5–15)
Anion gap: 10 (ref 5–15)
BUN: 22 mg/dL (ref 8–23)
BUN: 23 mg/dL (ref 8–23)
BUN: 22 mg/dL (ref 8–23)
BUN: 22 mg/dL (ref 8–23)
CO2: 36 mmol/L — ABNORMAL HIGH (ref 22–32)
CO2: 37 mmol/L — ABNORMAL HIGH (ref 22–32)
CO2: 36 mmol/L — ABNORMAL HIGH (ref 22–32)
CO2: 35 mmol/L — ABNORMAL HIGH (ref 22–32)
Calcium: 8.5 mg/dL — ABNORMAL LOW (ref 8.9–10.3)
Calcium: 8.1 mg/dL — ABNORMAL LOW (ref 8.9–10.3)
Calcium: 8.2 mg/dL — ABNORMAL LOW (ref 8.9–10.3)
Calcium: 8.1 mg/dL — ABNORMAL LOW (ref 8.9–10.3)
Chloride: 73 mmol/L — ABNORMAL LOW (ref 98–111)
Chloride: 75 mmol/L — ABNORMAL LOW (ref 98–111)
Chloride: 79 mmol/L — ABNORMAL LOW (ref 98–111)
Chloride: 77 mmol/L — ABNORMAL LOW (ref 98–111)
Creatinine, Ser: 1.55 mg/dL — ABNORMAL HIGH (ref 0.61–1.24)
Creatinine, Ser: 1.47 mg/dL — ABNORMAL HIGH (ref 0.61–1.24)
Creatinine, Ser: 1.35 mg/dL — ABNORMAL HIGH (ref 0.61–1.24)
Creatinine, Ser: 1.4 mg/dL — ABNORMAL HIGH (ref 0.61–1.24)
GFR, Estimated: 42 mL/min — ABNORMAL LOW (ref >60)
GFR, Estimated: 45 mL/min — ABNORMAL LOW (ref >60)
GFR, Estimated: 50 mL/min — ABNORMAL LOW (ref >60)
GFR, Estimated: 47 mL/min — ABNORMAL LOW (ref >60)
Glucose, Bld: 175 mg/dL — ABNORMAL HIGH (ref 70–99)
Glucose, Bld: 156 mg/dL — ABNORMAL HIGH (ref 70–99)
Glucose, Bld: 108 mg/dL — ABNORMAL HIGH (ref 70–99)
Glucose, Bld: 123 mg/dL — ABNORMAL HIGH (ref 70–99)
Potassium: 3.8 mmol/L (ref 3.5–5.1)
Potassium: 3.6 mmol/L (ref 3.5–5.1)
Potassium: 4 mmol/L (ref 3.5–5.1)
Potassium: 3.8 mmol/L (ref 3.5–5.1)
Sodium: 122 mmol/L — ABNORMAL LOW (ref 135–145)
Sodium: 124 mmol/L — ABNORMAL LOW (ref 135–145)
Sodium: 125 mmol/L — ABNORMAL LOW (ref 135–145)
Sodium: 122 mmol/L — ABNORMAL LOW (ref 135–145)

## 2022-10-08 LAB — IRON AND TIBC
Iron: 23 ug/dL — ABNORMAL LOW (ref 45–182)
Saturation Ratios: 7 % — ABNORMAL LOW (ref 17.9–39.5)
TIBC: 351 ug/dL (ref 250–450)
UIBC: 328 ug/dL

## 2022-10-08 LAB — CBC
HCT: 27.1 % — ABNORMAL LOW (ref 39.0–52.0)
Hemoglobin: 8.6 g/dL — ABNORMAL LOW (ref 13.0–17.0)
MCH: 29.3 pg (ref 26.0–34.0)
MCHC: 31.7 g/dL (ref 30.0–36.0)
MCV: 92.2 fL (ref 80.0–100.0)
Platelets: 209 10*3/uL (ref 150–400)
RBC: 2.94 MIL/uL — ABNORMAL LOW (ref 4.22–5.81)
RDW: 13.2 % (ref 11.5–15.5)
WBC: 7.6 10*3/uL (ref 4.0–10.5)
nRBC: 0 % (ref 0.0–0.2)

## 2022-10-08 LAB — FERRITIN: Ferritin: 139 ng/mL (ref 24–336)

## 2022-10-08 LAB — STREP PNEUMONIAE URINARY ANTIGEN: Strep Pneumo Urinary Antigen: NEGATIVE

## 2022-10-08 LAB — RETICULOCYTES
Immature Retic Fract: 12.8 % (ref 2.3–15.9)
RBC.: 3.5 MIL/uL — ABNORMAL LOW (ref 4.22–5.81)
Retic Count, Absolute: 79.8 10*3/uL (ref 19.0–186.0)
Retic Ct Pct: 2.3 % (ref 0.4–3.1)

## 2022-10-08 LAB — MAGNESIUM: Magnesium: 2.2 mg/dL (ref 1.7–2.4)

## 2022-10-08 LAB — CK: Total CK: 237 U/L (ref 49–397)

## 2022-10-08 LAB — VITAMIN B12: Vitamin B-12: 930 pg/mL — ABNORMAL HIGH (ref 180–914)

## 2022-10-08 LAB — TSH: TSH: 2.992 u[IU]/mL (ref 0.350–4.500)

## 2022-10-08 LAB — OSMOLALITY, URINE: Osmolality, Ur: 276 mosm/kg — ABNORMAL LOW (ref 300–900)

## 2022-10-08 LAB — MRSA NEXT GEN BY PCR, NASAL: MRSA by PCR Next Gen: NOT DETECTED

## 2022-10-08 LAB — FOLATE: Folate: 30.3 ng/mL (ref >5.9)

## 2022-10-08 LAB — PHOSPHORUS: Phosphorus: 4.1 mg/dL (ref 2.5–4.6)

## 2022-10-08 LAB — OSMOLALITY: Osmolality: 269 mosm/kg — ABNORMAL LOW (ref 275–295)

## 2022-10-08 LAB — PROCALCITONIN: Procalcitonin: <0.1 ng/mL

## 2022-10-08 MED ORDER — IPRATROPIUM-ALBUTEROL 0.5-2.5 (3) MG/3ML IN SOLN
3.0000 mL | Freq: Four times a day (QID) | RESPIRATORY_TRACT | Status: DC
  Administered 2022-10-08 – 2022-10-09 (×5): 3 mL via RESPIRATORY_TRACT
  Filled 2022-10-08 (×5): qty 3

## 2022-10-08 MED ORDER — PANTOPRAZOLE SODIUM 40 MG IV SOLR
40.0000 mg | Freq: Every day | INTRAVENOUS | Status: DC
  Administered 2022-10-08 – 2022-10-10 (×3): 40 mg via INTRAVENOUS
  Filled 2022-10-08 (×2): qty 10

## 2022-10-08 MED ORDER — SODIUM CHLORIDE 0.9 % IV SOLN
2.0000 g | INTRAVENOUS | Status: DC
  Administered 2022-10-08 – 2022-10-11 (×4): 2 g via INTRAVENOUS
  Filled 2022-10-08 (×3): qty 20

## 2022-10-08 MED ORDER — HALOPERIDOL LACTATE 5 MG/ML IJ SOLN
2.0000 mg | Freq: Four times a day (QID) | INTRAMUSCULAR | Status: DC | PRN
  Administered 2022-10-09 – 2022-10-10 (×4): 2 mg via INTRAVENOUS
  Filled 2022-10-08 (×5): qty 1

## 2022-10-08 MED ORDER — SODIUM CHLORIDE 0.9 % IV SOLN
500.0000 mg | INTRAVENOUS | Status: DC
  Administered 2022-10-08 – 2022-10-11 (×4): 500 mg via INTRAVENOUS
  Filled 2022-10-08 (×3): qty 5

## 2022-10-08 MED ORDER — TAMSULOSIN HCL 0.4 MG PO CAPS
0.4000 mg | ORAL_CAPSULE | Freq: Every day | ORAL | Status: DC
  Administered 2022-10-09 – 2022-10-10 (×2): 0.4 mg via ORAL
  Filled 2022-10-08 (×2): qty 1

## 2022-10-08 MED ORDER — ONDANSETRON HCL 4 MG/2ML IJ SOLN: 4.0000 mg | Freq: Four times a day (QID) | INTRAMUSCULAR | Status: DC | PRN

## 2022-10-08 MED ORDER — SODIUM CHLORIDE 0.9 % IV SOLN: 1.0000 g | INTRAVENOUS | Status: DC

## 2022-10-08 MED ORDER — ONDANSETRON HCL 4 MG PO TABS: 4.0000 mg | ORAL_TABLET | Freq: Four times a day (QID) | ORAL | Status: DC | PRN

## 2022-10-08 MED ORDER — ROSUVASTATIN CALCIUM 20 MG PO TABS
20.0000 mg | ORAL_TABLET | Freq: Every evening | ORAL | Status: DC
  Administered 2022-10-09 – 2022-10-10 (×2): 20 mg via ORAL
  Filled 2022-10-08 (×2): qty 1

## 2022-10-08 MED ORDER — ACETAMINOPHEN 650 MG RE SUPP: 650.0000 mg | Freq: Four times a day (QID) | RECTAL | Status: DC | PRN

## 2022-10-08 MED ORDER — DEXTROSE-SODIUM CHLORIDE 5-0.9 % IV SOLN: INTRAVENOUS | Status: DC

## 2022-10-08 MED ORDER — ALBUTEROL SULFATE (2.5 MG/3ML) 0.083% IN NEBU
2.5000 mg | INHALATION_SOLUTION | RESPIRATORY_TRACT | Status: DC | PRN
  Administered 2022-10-10 (×2): 2.5 mg via RESPIRATORY_TRACT
  Filled 2022-10-08 (×2): qty 3

## 2022-10-08 MED ORDER — ACETAMINOPHEN 325 MG PO TABS: 650.0000 mg | ORAL_TABLET | Freq: Four times a day (QID) | ORAL | Status: DC | PRN

## 2022-10-08 MED ORDER — FENTANYL CITRATE PF 50 MCG/ML IJ SOSY
12.5000 ug | PREFILLED_SYRINGE | Freq: Once | INTRAMUSCULAR | Status: AC
  Administered 2022-10-08: 12.5 ug via INTRAVENOUS
  Filled 2022-10-08: qty 1

## 2022-10-08 MED ORDER — ALBUTEROL SULFATE (2.5 MG/3ML) 0.083% IN NEBU: 2.5000 mg | INHALATION_SOLUTION | RESPIRATORY_TRACT | Status: DC | PRN

## 2022-10-08 MED ORDER — CHLORHEXIDINE GLUCONATE CLOTH 2 % EX PADS
6.0000 | MEDICATED_PAD | Freq: Every day | CUTANEOUS | Status: DC
  Administered 2022-10-08 – 2022-10-10 (×3): 6 via TOPICAL

## 2022-10-08 MED ORDER — ALBUTEROL SULFATE (2.5 MG/3ML) 0.083% IN NEBU: 2.5000 mg | INHALATION_SOLUTION | Freq: Four times a day (QID) | RESPIRATORY_TRACT | Status: DC | PRN

## 2022-10-08 MED ORDER — SODIUM CHLORIDE 0.9 % IV SOLN: INTRAVENOUS | Status: AC

## 2022-10-08 NOTE — Consult Note (Signed)
NAME:  Gerald Valdez, MRN:  [id number], DOB:  02-22-1928, LOS: 1 ADMISSION DATE:  10/07/2022, CONSULTATION DATE:  8/30 REFERRING MD:  Maurice Small, CHIEF COMPLAINT:  acute on chronic resp failure    History of Present Illness:  89 year old male patient with known history of chronic respiratory failure both hypoxic and hypercarbic in the setting of advanced emphysema followed by Dr. Everardo All in the outpatient setting.  He is chronically on 3 L nasal cannula.  He is enrolled in palliative care.  At baseline he is a DNR status, he has been intolerant of BiPAP in the past.  Has a known history of dementia cared for by his family.  Has a chronic Foley, which is typically changed by family, last change on 8/27.  Presented to the emergency room on 8/29 with change in mental status, hematuria and hypoxia.  Family noted mild hematuria following catheter change the day prior.  Also noted to have worsening hypoxia with pulse oximetry registered at 80% on 3 L ER evaluation: Urine strep negative , Initial venous blood gas pH 7.29 pCO2 of 90 HCO3 of 43.3 Sodium 120, typically 125 range Creatinine 1.47 up from 1.21 white blood cell count 11.3 with hemoglobin 10.4 minimally elevated troponin I at 32 BNP 467 COVID-negative UA cloudy with large amount of hemoglobin positive for nitrates and protein urine Osmo 276 urine sodium 37 procalcitonin less than 0.10 osmolality 269 Cultures were sent. He was placed on increased supplemental oxygen, a CT of head was obtained which was negative for acute intracranial pathology portable chest x-ray obtained showed fairly significant emphysema with new bibasilar infiltrates in the bases.  Was treated with IV furosemide, however following this his blood pressure dropped. Got dose of solumedrol  He was admitted initially with a working diagnosis of acute on chronic respiratory failure, his urinalysis was unremarkable for bacteria he has known colonization ultimately he was admitted with  working diagnosis of pneumonia  Therapeutic interventions to date have included supplemental oxygen, IV antibiotics, and he was placed on noninvasive positive pressure ventilation in the emergency room on 8/29.  His course has been complicated by agitated delirium.  Requiring intermittent fentanyl.  Most recent sodium up to 124, venous blood gas 7.32, pCO2 of 78, and HCO3 of 40  Pertinent  Medical History  Combined CHF w/ LVEF 40-45% Chronic indwelling foley  Chronic hypoxic and hypercarbic resp failure w/ COPD baseline O2 needs 2-3 liters  Last seen by Dr Everardo All 7/17: her notes report intolerance to BIPAP, compliant w/ Anoro   Nadir O2 sats 89% on 3 liters. Has chronic SOB w/ exertion. Enrolled in palliative care.   Family considering hospice  HTN, HLD, CKD stage IIIa , af not on AC d/t hematuria. Chronic hyponatremia, dementia. Cdiff   Significant Hospital Events: Including procedures, antibiotic start and stop dates in addition to other pertinent events     Interim History / Subjective:  Lying in bed, confused, at times will yell out incomprehensible words  Objective   Blood pressure (!) 119/57, pulse 98, temperature (!) 97.3 F (36.3 C), temperature source Axillary, resp. rate 20, SpO2 99%.    FiO2 (%):  [40 %-50 %] 40 %   Intake/Output Summary (Last 24 hours) at 10/08/2022 1207 Last data filed at 10/08/2022 0800 Gross per 24 hour  Intake 612.54 ml  Output 870 ml  Net -257.46 ml   There were no vitals filed for this visit.  Examination: General: Chronically ill elderly and cachectic male patient currently lethargic  lying in bed.  Intermittently agitated HENT: Temporal wasting mucous membranes dry neck veins flat Lungs: Diminished bases mild accessory use Portable checks x-ray personally reviewed shows bibasilar airspace disease superimposed on underlying emphysematous changes Cardiovascular: Regular rate and rhythm Abdomen: Soft nontender Extremities: Warm dry several areas  of skin tears no edema Neuro: Moving all extremities, lethargic but intermittently not talking, but will intermittently yell out in incomprehensible word GU: Foley catheter in place clearing hematuria noted Resolved Hospital Problem list     Assessment & Plan:   Acute on chronic hypoxic and hypercarbic respiratory failure due to pneumonia superimposed on advanced COPD/emphysema  Plan Continue supplemental oxygen. No further BiPAP, he is not a candidate for this given risk of aspiration, and advanced dementia. Scheduled bronchodilators Continuing antibiotics We will hold off systemic steroids given he is already agitated and he is not wheezing Spoke with his wife, I have recommended seeing how he does over the next 24 to 48 hours and if no improvement consider early transition to comfort care.  Even if he does improve it is time to enroll in hospice  Sepsis 2/2 PNA ? Aspiration vs CAP Wbc improved  Plan Antibiotics as above Continuing gentle IV fluids  Agitated delirium superimposed on underlying dementia.  His wife describes several weeks now of worsening confusion.  I suspect this is actually more terminal dementia with agitated delirium  Plan Supportive care Avoiding steroids, these have exacerbated confusion in the past and are listed as an allergy We will add low-dose Haldol Would be careful with narcotics or benzodiazepines unless we are switching solely to comfort care, depending on his course over the next 48 hours this may be necessary  Hypoosmolar hyponatremia, acute on chronic. This is actually improved with IV hydration suggesting at least some element of volume depletion Plan Okay to continue gentle IV hydration His p.o. intake has been very poor for the last 4 weeks  Chronic Foley catheter Plan Continue routine nursing care  Chronic atrial fibrillation Plan Telemetry monitoring and rate control  Severe protein calorie malnutrition with weight loss secondary to  advanced dementia Plan Supportive care  History of HFrEF Plan Cont tele for now  Holding diuretics   DNR status/end-of-life Plan Continuing 24 to 48 hours of supportive care, if no improvement transition to comfort  We will be available as needed, however I think most of his needs would be best served by a palliative approach   Best Practice (right click and "Reselect all SmartList Selections" daily)   Per primary   Labs   CBC: Recent Labs  Lab 10/07/22 2118 10/07/22 2132 10/08/22 0725  WBC 11.3*  --  7.6  NEUTROABS 8.9*  --   --   HGB 10.4* 10.9* 8.6*  HCT 33.5* 32.0* 27.1*  MCV 92.5  --  92.2  PLT 273  --  209    Basic Metabolic Panel: Recent Labs  Lab 10/07/22 2118 10/07/22 2132 10/07/22 2309 10/08/22 0121 10/08/22 0725  NA 120* 117*  --  122* 124*  K 4.3 4.1  --  3.8 3.6  CL 72* 72*  --  73* 75*  CO2 35*  --   --  36* 37*  GLUCOSE 197* 188*  --  175* 156*  BUN 21 24*  --  22 23  CREATININE 1.47* 1.70*  --  1.55* 1.47*  CALCIUM 8.7*  --   --  8.5* 8.1*  MG  --   --  2.2  --   --  PHOS  --   --  4.1  --   --    GFR: CrCl cannot be calculated (Unknown ideal weight.). Recent Labs  Lab 10/07/22 2118 10/07/22 2309 10/08/22 0725  PROCALCITON  --  <0.10  --   WBC 11.3*  --  7.6    Liver Function Tests: Recent Labs  Lab 10/07/22 2118  AST 48*  ALT 30  ALKPHOS 81  BILITOT 0.6  PROT 7.1  ALBUMIN 4.0   No results for input(s): "LIPASE", "AMYLASE" in the last 168 hours. No results for input(s): "AMMONIA" in the last 168 hours.  ABG    Component Value Date/Time   PHART 7.328 (L) 08/02/2022 0502   PCO2ART 69.6 (HH) 08/02/2022 0502   PO2ART 73 (L) 08/02/2022 0502   HCO3 40.3 (H) 10/08/2022 0322   TCO2 39 (H) 10/07/2022 2132   O2SAT 42.9 10/08/2022 0322     Coagulation Profile: No results for input(s): "INR", "PROTIME" in the last 168 hours.  Cardiac Enzymes: Recent Labs  Lab 10/07/22 2309  CKTOTAL 237    HbA1C: Hgb A1c MFr Bld   Date/Time Value Ref Range Status  07/01/2021 05:37 PM 6.1 (H) 4.8 - 5.6 % Final    Comment:    (NOTE) Pre diabetes:          5.7%-6.4%  Diabetes:              >6.4%  Glycemic control for   <7.0% adults with diabetes     CBG: No results for input(s): "GLUCAP" in the last 168 hours.  Review of Systems:   Not able Past Medical History:  He,  has a past medical history of Acute duodenal ulcer with bleeding (01/30/2014), Acute post-hemorrhagic anemia (01/30/2014), Anxiety, Aortic atherosclerosis (HCC) (02/10/2020), Arthritis, Basal cell carcinoma, Cancer (HCC), Chronic kidney disease, COPD exacerbation (HCC) (02/10/2020), Coronary artery disease, Dyspnea, Dyspnea on exertion (02/10/2020), Emphysema of lung (HCC), Essential hypertension (02/10/2020), GERD (gastroesophageal reflux disease) (02/10/2020), GI bleed (01/30/2014), and Hyperlipidemia (02/10/2020).   Surgical History:   Past Surgical History:  Procedure Laterality Date   BLADDER SURGERY     CATARACT EXTRACTION W/ INTRAOCULAR LENS IMPLANT     CYSTOSCOPY W/ RETROGRADES Bilateral 11/20/2020   Procedure: CYSTOSCOPY WITH RETROGRADE PYELOGRAM;  Surgeon: Marcine Matar, MD;  Location: [facility];  Service: Urology;  Laterality: Bilateral;   CYSTOSCOPY W/ RETROGRADES Right 02/12/2021   Procedure: CYSTOSCOPY WITH RETROGRADE PYELOGRAM;  Surgeon: Marcine Matar, MD;  Location: [facility];  Service: Urology;  Laterality: Right;   CYSTOSCOPY W/ RETROGRADES Bilateral 06/25/2021   Procedure: CYSTOSCOPY WITH RETROGRADE PYELOGRAM;  Surgeon: Marcine Matar, MD;  Location: [facility];  Service: Urology;  Laterality: Bilateral;   ESOPHAGOGASTRODUODENOSCOPY N/A 01/30/2014   Procedure: ESOPHAGOGASTRODUODENOSCOPY (EGD);  Surgeon: Louis Meckel, MD;  Location: [facility];  Service: Endoscopy;  Laterality: N/A;   TONSILLECTOMY     TRANSURETHRAL RESECTION OF BLADDER TUMOR N/A 02/12/2021   Procedure: REPEAT TRANSURETHRAL RESECTION OF BLADDER TUMOR  (TURBT);  Surgeon: Marcine Matar, MD;  Location: [facility];  Service: Urology;  Laterality: N/A;   TRANSURETHRAL RESECTION OF BLADDER TUMOR WITH MITOMYCIN-C N/A 11/20/2020   Procedure: TRANSURETHRAL RESECTION OF BLADDER TUMOR;  Surgeon: Marcine Matar, MD;  Location: [facility];  Service: Urology;  Laterality: N/A;   TRANSURETHRAL RESECTION OF BLADDER TUMOR WITH MITOMYCIN-C N/A 06/25/2021   Procedure: TRANSURETHRAL RESECTION OF BLADDER TUMOR WITH GEMCITABINE;  Surgeon: Marcine Matar, MD;  Location: [facility];  Service: Urology;  Laterality: N/A;  1 HR   TRANSURETHRAL  RESECTION OF BLADDER TUMOR WITH MITOMYCIN-C N/A 12/10/2021   Procedure: TRANSURETHRAL RESECTION OF BLADDER TUMOR WITH GEMCITABINE;  Surgeon: Marcine Matar, MD;  Location: [facility];  Service: Urology;  Laterality: N/A;  1 HR   TRANSURETHRAL RESECTION OF BLADDER TUMOR WITH MITOMYCIN-C N/A 05/27/2022   Procedure: TRANSURETHRAL RESECTION OF BLADDER TUMOR;  Surgeon: Marcine Matar, MD;  Location: [facility];  Service: Urology;  Laterality: N/A;     Social History:   reports that he quit smoking about 30 years ago. His smoking use included cigarettes. He started smoking about 77 years ago. He has never used smokeless tobacco. He reports that he does not currently use alcohol. He reports that he does not use drugs.   Family History:  His family history includes Hypertension in his mother; Stroke in his father.   Allergies Allergies  Allergen Reactions   Prednisone Other (See Comments)    Unknown reaction     Home Medications  Prior to Admission medications   Medication Sig Start Date End Date Taking? Authorizing Provider  albuterol (PROVENTIL) (2.5 MG/3ML) 0.083% nebulizer solution Take 3 mLs (2.5 mg total) by nebulization every 6 (six) hours as needed for wheezing or shortness of breath. Chronic hypercapneic respiratory failure and COPD J96.12, J44.9 08/17/22  Yes Russella Dar, NP  ascorbic acid (VITAMIN C) 500 MG tablet  Take 500 mg by mouth daily.   Yes [provider]  CRANBERRY PO Take 1 tablet by mouth daily with breakfast.   Yes [provider]  escitalopram (LEXAPRO) 10 MG tablet Take 10 mg by mouth at bedtime.   Yes [provider]  furosemide (LASIX) 40 MG tablet Take 1 tablet (40 mg total) by mouth daily. Patient taking differently: Take 40 mg by mouth in the morning. 08/15/22  Yes Zannie Cove, MD  loratadine (CLARITIN) 10 MG tablet Take 10 mg by mouth daily.   Yes [provider]  metoprolol succinate (TOPROL-XL) 25 MG 24 hr tablet Take 2 tablets (50 mg total) by mouth daily. Patient taking differently: Take 25 mg by mouth See admin instructions. Take 25 mg by mouth in the morning ONLY if the Systolic number is greater than 110 and the Diastolic number is greater than 60 08/15/22  Yes Zannie Cove, MD  Multiple Vitamin (MULTIVITAMIN WITH MINERALS) TABS tablet Take 1 tablet by mouth daily with breakfast.   Yes [provider]  pantoprazole (PROTONIX) 40 MG tablet Take 1 tablet (40 mg total) by mouth 2 (two) times daily before a meal. 08/06/22  Yes Jonah Blue, MD  polyethylene glycol (MIRALAX / GLYCOLAX) 17 g packet Take 17 g by mouth daily as needed for mild constipation. 07/27/22  Yes Lanae Boast, MD  potassium chloride SA (KLOR-CON M) 20 MEQ tablet Take 1 tablet (20 mEq total) by mouth daily. Patient taking differently: Take 20 mEq by mouth in the morning. 08/15/22  Yes Zannie Cove, MD  rosuvastatin (CRESTOR) 20 MG tablet Take 20 mg by mouth every evening.   Yes [provider]  saccharomyces boulardii (FLORASTOR) 250 MG capsule Take 1 capsule (250 mg total) by mouth 2 (two) times daily. 07/05/21  Yes Leatha Gilding, MD  tamsulosin (FLOMAX) 0.4 MG CAPS capsule Take 1 capsule (0.4 mg total) by mouth daily. 08/06/22  Yes Jonah Blue, MD  umeclidinium-vilanterol [facility] ELLIPTA) 62.5-25 MCG/ACT AEPB Inhale 1 puff into the lungs daily. Patient  taking differently: Inhale 1 puff into the lungs See admin instructions. Inhale 1 puff into the lungs in  the morning only if the albuterol in the nebulizer has not yet been used 08/25/22  Yes Everardo All, Oliver Pila, MD  feeding supplement (ENSURE ENLIVE / ENSURE PLUS) LIQD Take 237 mLs by mouth 2 (two) times daily between meals. Patient not taking: Reported on 09/10/2022 07/27/22   Lanae Boast, MD  OXYGEN Inhale 3 L/min into the lungs continuous.    [provider]     Critical care time: NA    Simonne Martinet ACNP-BC [facility] Pulmonary/Critical Care Pager # [phone] OR # [phone] if no answer

## 2022-10-08 NOTE — Plan of Care (Signed)
  Problem: Respiratory: Goal: Ability to maintain a clear airway will improve Outcome: Progressing Goal: Levels of oxygenation will improve Outcome: Progressing Goal: Ability to maintain adequate ventilation will improve Outcome: Progressing  Problem: Education: Goal: Knowledge of disease or condition will improve Outcome: Not Progressing Goal: Knowledge of the prescribed therapeutic regimen will improve Outcome: Not Progressing Goal: Individualized Educational Video(s) Outcome: Not Progressing   Problem: Activity: Goal: Ability to tolerate increased activity will improve Outcome: Not Progressing Goal: Will verbalize the importance of balancing activity with adequate rest periods Outcome: Not Progressing   Problem: Education: Goal: Knowledge of General Education information will improve Description: Including pain rating scale, medication(s)/side effects and non-pharmacologic comfort measures Outcome: Not Progressing   Problem: Health Behavior/Discharge Planning: Goal: Ability to manage health-related needs will improve Outcome: Not Progressing   Problem: Clinical Measurements: Goal: Ability to maintain clinical measurements within normal limits will improve Outcome: Not Progressing Goal: Will remain free from infection Outcome: Not Progressing Goal: Diagnostic test results will improve Outcome: Not Progressing Goal: Respiratory complications will improve Outcome: Not Progressing Goal: Cardiovascular complication will be avoided Outcome: Not Progressing   Problem: Activity: Goal: Risk for activity intolerance will decrease Outcome: Not Progressing   Problem: Nutrition: Goal: Adequate nutrition will be maintained Outcome: Not Progressing   Problem: Coping: Goal: Level of anxiety will decrease Outcome: Not Progressing   Problem: Elimination: Goal: Will not experience complications related to bowel motility Outcome: Not Progressing Goal: Will not experience  complications related to urinary retention Outcome: Not Progressing   Problem: Pain Managment: Goal: General experience of comfort will improve Outcome: Not Progressing   Problem: Safety: Goal: Ability to remain free from injury will improve Outcome: Not Progressing   Problem: Skin Integrity: Goal: Risk for impaired skin integrity will decrease Outcome: Not Progressing   Problem: Education: Goal: Knowledge of disease or condition will improve Outcome: Not Progressing Goal: Knowledge of the prescribed therapeutic regimen will improve Outcome: Not Progressing Goal: Individualized Educational Video(s) Outcome: Not Progressing   Problem: Activity: Goal: Ability to tolerate increased activity will improve Outcome: Not Progressing Goal: Will verbalize the importance of balancing activity with adequate rest periods Outcome: Not Progressing   Problem: Education: Goal: Knowledge of General Education information will improve Description: Including pain rating scale, medication(s)/side effects and non-pharmacologic comfort measures Outcome: Not Progressing   Problem: Health Behavior/Discharge Planning: Goal: Ability to manage health-related needs will improve Outcome: Not Progressing   Problem: Clinical Measurements: Goal: Ability to maintain clinical measurements within normal limits will improve Outcome: Not Progressing Goal: Will remain free from infection Outcome: Not Progressing Goal: Diagnostic test results will improve Outcome: Not Progressing Goal: Respiratory complications will improve Outcome: Not Progressing Goal: Cardiovascular complication will be avoided Outcome: Not Progressing   Problem: Activity: Goal: Risk for activity intolerance will decrease Outcome: Not Progressing   Problem: Nutrition: Goal: Adequate nutrition will be maintained Outcome: Not Progressing   Problem: Coping: Goal: Level of anxiety will decrease Outcome: Not Progressing   Problem:  Elimination: Goal: Will not experience complications related to bowel motility Outcome: Not Progressing Goal: Will not experience complications related to urinary retention Outcome: Not Progressing   Problem: Pain Managment: Goal: General experience of comfort will improve Outcome: Not Progressing   Problem: Safety: Goal: Ability to remain free from injury will improve Outcome: Not Progressing   Problem: Skin Integrity: Goal: Risk for impaired skin integrity will decrease Outcome: Not Progressing

## 2022-10-08 NOTE — Assessment & Plan Note (Signed)
ID felt that this was more colonization UA showing no evidence of bacteria White and red blood cells in the setting of significant hematuria after his Foley catheter was recently exchange

## 2022-10-08 NOTE — Evaluation (Signed)
SLP Cancellation Note  Patient Details Name: Gerald Valdez MRN: [id number] DOB: [birth year]   Cancelled treatment:        pt not alert and on bipap this am, will continue efforts  Rolena Infante, MS [facility] SLP Acute Rehab Services Office [phone]    Chales Abrahams 10/08/2022, 2:22 PM

## 2022-10-08 NOTE — Assessment & Plan Note (Signed)
Chronic with recurrent hematuria status post Foley catheter continue to monitor

## 2022-10-08 NOTE — Assessment & Plan Note (Signed)
Chest x-ray showed possible infiltrate.  For tonight covered with Rocephin and azithromycin Check procalcitonin and adjust antibiotics if needed Check strep pneumo

## 2022-10-08 NOTE — Assessment & Plan Note (Signed)
Obtain nutritional consult check prealbumin

## 2022-10-08 NOTE — Progress Notes (Signed)
   10/08/22 0100  BiPAP/CPAP/SIPAP  BiPAP/CPAP/SIPAP Pt Type Adult  BiPAP/CPAP/SIPAP V60 (transorted to ICU)  FiO2 (%) 40 %   Transported to ICU on BiPAP without event.

## 2022-10-08 NOTE — Assessment & Plan Note (Addendum)
Recurrent issue in the setting of hypercapnia. Currently on BiPAP will repeat ABG and adjust BiPAP as needed.  Discussed with family at length we will not escalate care from now Continue with current interventions with BiPAP as needed DO NOT INTUBATE patient would benefit from palliative care consult. CT head unremarkable Discussed possibility get MRI with family but this is unlikely to be beneficial patient does not tolerate anticoagulation or aspirin secondary to hematuria and fall risk Would unlikely to tolerate MRI given confusion and significant dementia.  Family is comfortable to hold off on MRI at this time

## 2022-10-08 NOTE — Assessment & Plan Note (Signed)
No wheezing or history of cough Chest x-ray did show possibility of infiltrates Continue home medications albuterol as needed scheduled DuoNeb hold off on steroids for right now continue to monitor

## 2022-10-08 NOTE — Assessment & Plan Note (Signed)
this patient has acute respiratory failure with Hypoxia and  Hypercarbia as documented by the presence of following: O2 saturatio< 90% on 2L   pH <7.35 with pCO2 >50   Likely due to:   Pneumonia,  Provide O2 therapy and titrate as needed  Continuous pulse ox   check Pulse ox with ambulation prior to discharge

## 2022-10-08 NOTE — Progress Notes (Signed)
PT Cancellation Note  Patient Details Name: Gerald Valdez MRN: [id number] DOB: [birth year]   Cancelled Treatment:    Reason Eval/Treat Not Completed: Other (comment)  Spoke with RN who reports pt still very agitated.  Also, noted still on Bipap.  Will continue to f/u as able.  Anise Salvo, PT Acute Rehab [facility] [phone]  Gerald Valdez 10/08/2022, 9:49 AM

## 2022-10-08 NOTE — Assessment & Plan Note (Signed)
Although initially patient was given a dose of Lasix his blood pressure went down there after clinically he does not appear to be fluid overloaded BNP is actually stable Chest x-ray indeterminant No peripheral edema Continue to monitor fluid status hold off on further Lasix doses if anything given hypotension will gently rehydrate now

## 2022-10-08 NOTE — Assessment & Plan Note (Signed)
 Obtain anemia panel  Transfuse for Hg <7 , rapidly dropping or  if symptomatic

## 2022-10-08 NOTE — Progress Notes (Signed)
   10/08/22 0350  BiPAP/CPAP/SIPAP  $ Non-Invasive Ventilator  Non-Invasive Vent Subsequent  BiPAP/CPAP/SIPAP Pt Type Adult  BiPAP/CPAP/SIPAP V60  Mask Type Full face mask  Mask Size Large  Set Rate 22 breaths/min  Respiratory Rate 24 breaths/min  IPAP 18 cmH20  EPAP 6 cmH2O  FiO2 (%) 40 %  Minute Ventilation 13.5  Leak 10  Peak Inspiratory Pressure (PIP) 18  Tidal Volume (Vt) 533  Patient Home Equipment No  Auto Titrate No  Press High Alarm 30 cmH2O  Press Low Alarm 5 cmH2O

## 2022-10-08 NOTE — ED Notes (Signed)
ED TO INPATIENT HANDOFF REPORT  ED Nurse Name and Phone #: Antoine Primas RN  S Name/Age/Gender Gerald Valdez 89 y.o. male Room/Bed: RESA/RESA  Code Status   Code Status: Limited: Do not attempt resuscitation (DNR) -DNR-LIMITED -Do Not Intubate/DNI   Home/SNF/Other Home Orientation: unable to assess  Is this baseline? No   Triage Complete: Triage complete  Chief Complaint Acute respiratory failure (HCC) [J96.00]  Triage Note Pt from home altered mental status more than normal,hx dementia, pt family called, more agitated as well, per EMS, pt had foley changed by family, family reported to EMS that foley normally bloody after being changed, pt alert but not orientated at this time.    Allergies Allergies  Allergen Reactions   Prednisone Other (See Comments)    Unknown reaction    Level of Care/Admitting Diagnosis ED Disposition     ED Disposition  Admit   Condition  --   Comment  Hospital Area: [facility] [[id number]]  Level of Care: Stepdown [14]  Admit to SDU based on following criteria: Respiratory Distress:  Frequent assessment and/or intervention to maintain adequate ventilation/respiration, pulmonary toilet, and respiratory treatment.  May admit patient to [facility] or [facility] if equivalent level of care is available:: No  Covid Evaluation: Asymptomatic - no recent exposure (last 10 days) testing not required  Diagnosis: Acute respiratory failure (HCC) [518.81.ICD-9-CM]  Admitting Physician: Therisa Doyne [[id number]]  Attending Physician: Therisa Doyne [[id number]]  Certification:: I certify this patient will need inpatient services for at least 2 midnights  Expected Medical Readiness: 10/10/2022          B Medical/Surgery History Past Medical History:  Diagnosis Date   Acute duodenal ulcer with bleeding 01/30/2014   Acute post-hemorrhagic anemia 01/30/2014   Anxiety    Aortic atherosclerosis (HCC) 02/10/2020   Arthritis     Basal cell carcinoma    Cancer (HCC)    bladder   Chronic kidney disease    COPD exacerbation (HCC) 02/10/2020   Coronary artery disease    Dyspnea    Dyspnea on exertion 02/10/2020   Emphysema of lung (HCC)    Essential hypertension 02/10/2020   GERD (gastroesophageal reflux disease) 02/10/2020   GI bleed 01/30/2014   Hyperlipidemia 02/10/2020   Past Surgical History:  Procedure Laterality Date   BLADDER SURGERY     CATARACT EXTRACTION W/ INTRAOCULAR LENS IMPLANT     CYSTOSCOPY W/ RETROGRADES Bilateral 11/20/2020   Procedure: CYSTOSCOPY WITH RETROGRADE PYELOGRAM;  Surgeon: Marcine Matar, MD;  Location: [facility];  Service: Urology;  Laterality: Bilateral;   CYSTOSCOPY W/ RETROGRADES Right 02/12/2021   Procedure: CYSTOSCOPY WITH RETROGRADE PYELOGRAM;  Surgeon: Marcine Matar, MD;  Location: [facility];  Service: Urology;  Laterality: Right;   CYSTOSCOPY W/ RETROGRADES Bilateral 06/25/2021   Procedure: CYSTOSCOPY WITH RETROGRADE PYELOGRAM;  Surgeon: Marcine Matar, MD;  Location: [facility];  Service: Urology;  Laterality: Bilateral;   ESOPHAGOGASTRODUODENOSCOPY N/A 01/30/2014   Procedure: ESOPHAGOGASTRODUODENOSCOPY (EGD);  Surgeon: Louis Meckel, MD;  Location: [facility];  Service: Endoscopy;  Laterality: N/A;   TONSILLECTOMY     TRANSURETHRAL RESECTION OF BLADDER TUMOR N/A 02/12/2021   Procedure: REPEAT TRANSURETHRAL RESECTION OF BLADDER TUMOR (TURBT);  Surgeon: Marcine Matar, MD;  Location: [facility];  Service: Urology;  Laterality: N/A;   TRANSURETHRAL RESECTION OF BLADDER TUMOR WITH MITOMYCIN-C N/A 11/20/2020   Procedure: TRANSURETHRAL RESECTION OF BLADDER TUMOR;  Surgeon: Marcine Matar, MD;  Location: [facility];  Service: Urology;  Laterality: N/A;  TRANSURETHRAL RESECTION OF BLADDER TUMOR WITH MITOMYCIN-C N/A 06/25/2021   Procedure: TRANSURETHRAL RESECTION OF BLADDER TUMOR WITH GEMCITABINE;  Surgeon: Marcine Matar, MD;  Location: [facility];  Service: Urology;   Laterality: N/A;  1 HR   TRANSURETHRAL RESECTION OF BLADDER TUMOR WITH MITOMYCIN-C N/A 12/10/2021   Procedure: TRANSURETHRAL RESECTION OF BLADDER TUMOR WITH GEMCITABINE;  Surgeon: Marcine Matar, MD;  Location: [facility];  Service: Urology;  Laterality: N/A;  1 HR   TRANSURETHRAL RESECTION OF BLADDER TUMOR WITH MITOMYCIN-C N/A 05/27/2022   Procedure: TRANSURETHRAL RESECTION OF BLADDER TUMOR;  Surgeon: Marcine Matar, MD;  Location: [facility];  Service: Urology;  Laterality: N/A;     A IV Location/Drains/Wounds Patient Lines/Drains/Airways Status     Active Line/Drains/Airways     Name Placement date Placement time Site Days   Peripheral IV 10/07/22 20 G 1" Anterior;Left;Upper Arm 10/07/22  2220  Arm  1   Peripheral IV 10/07/22 22 G 1" Posterior;Right Forearm 10/07/22  2200  Forearm  1   Urethral Catheter Jake RN Latex 16 Fr. 09/10/22  2319  Latex  28   Pressure Injury 08/11/22 Buttocks Mid;Bilateral;Medial Stage 2 -  Partial thickness loss of dermis presenting as a shallow open injury with a red, pink wound bed without slough. 08/11/22  0400  -- 58   Wound / Incision (Open or Dehisced) 08/11/22 Skin tear Pretibial Right;Lateral 08/11/22  0400  Pretibial  58   Wound / Incision (Open or Dehisced) 08/11/22 Skin tear Arm Left;Lower;Posterior;Proximal 08/11/22  0400  Arm  58            Intake/Output Last 24 hours No intake or output data in the 24 hours ending 10/08/22 0003  Labs/Imaging Results for orders placed or performed during the hospital encounter of 10/07/22 (from the past 48 hour(s))  Blood gas, venous (at [facility])     Status: Abnormal   Collection Time: 10/07/22  9:17 PM  Result Value Ref Range   pH, Ven 7.29 7.25 - 7.43   pCO2, Ven 90 (HH) 44 - 60 mmHg    Comment: CRITICAL RESULT CALLED TO, READ BACK BY AND VERIFIED WITH: PERRY,A RN IN ED @ 2140 10/07/22 BY CHILDRESS,E    pO2, Ven <31 (LL) 32 - 45 mmHg    Comment: CRITICAL RESULT CALLED TO, READ BACK BY AND VERIFIED  WITH: PERRY,A RN @ 2140 BY CHILDRESS,E    Bicarbonate 43.3 (H) 20.0 - 28.0 mmol/L   Acid-Base Excess 12.6 (H) 0.0 - 2.0 mmol/L   O2 Saturation 28 %   Patient temperature 37.0     Comment: Performed at [facility], [address] [place], [place] [address]  Comprehensive metabolic panel     Status: Abnormal   Collection Time: 10/07/22  9:18 PM  Result Value Ref Range   Sodium 120 (L) 135 - 145 mmol/L   Potassium 4.3 3.5 - 5.1 mmol/L   Chloride 72 (L) 98 - 111 mmol/L   CO2 35 (H) 22 - 32 mmol/L   Glucose, Bld 197 (H) 70 - 99 mg/dL    Comment: Glucose reference range applies only to samples taken after fasting for at least 8 hours.   BUN 21 8 - 23 mg/dL   Creatinine, Ser 6.57 (H) 0.61 - 1.24 mg/dL   Calcium 8.7 (L) 8.9 - 10.3 mg/dL   Total Protein 7.1 6.5 - 8.1 g/dL   Albumin 4.0 3.5 - 5.0 g/dL   Valdez 48 (H) 15 - 41 U/L  ALT 30 0 - 44 U/L   Alkaline Phosphatase 81 38 - 126 U/L   Total Bilirubin 0.6 0.3 - 1.2 mg/dL   GFR, Estimated 45 (L) >60 mL/min    Comment: (NOTE) Calculated using the CKD-EPI Creatinine Equation (2021)    Anion gap 13 5 - 15    Comment: Performed at [facility], [address] [place], [place] [address]  CBC with Differential     Status: Abnormal   Collection Time: 10/07/22  9:18 PM  Result Value Ref Range   WBC 11.3 (H) 4.0 - 10.5 K/uL   RBC 3.62 (L) 4.22 - 5.81 MIL/uL   Hemoglobin 10.4 (L) 13.0 - 17.0 g/dL   HCT 21.3 (L) 08.6 - 57.8 %   MCV 92.5 80.0 - 100.0 fL   MCH 28.7 26.0 - 34.0 pg   MCHC 31.0 30.0 - 36.0 g/dL   RDW 46.9 62.9 - 52.8 %   Platelets 273 150 - 400 K/uL   nRBC 0.2 0.0 - 0.2 %   Neutrophils Relative % 79 %   Neutro Abs 8.9 (H) 1.7 - 7.7 K/uL   Lymphocytes Relative 6 %   Lymphs Abs 0.7 0.7 - 4.0 K/uL   Monocytes Relative 13 %   Monocytes Absolute 1.5 (H) 0.1 - 1.0 K/uL   Eosinophils Relative 0 %   Eosinophils Absolute 0.0 0.0 - 0.5 K/uL   Basophils Relative 0 %   Basophils Absolute 0.0  0.0 - 0.1 K/uL   Immature Granulocytes 2 %   Abs Immature Granulocytes 0.18 (H) 0.00 - 0.07 K/uL    Comment: Performed at [facility], [address], [place], [place] [address]  Troponin I (High Sensitivity)     Status: Abnormal   Collection Time: 10/07/22  9:18 PM  Result Value Ref Range   Troponin I (High Sensitivity) 32 (H) <18 ng/L    Comment: (NOTE) Elevated high sensitivity troponin I (hsTnI) values and significant  changes across serial measurements may suggest ACS but many other  chronic and acute conditions are known to elevate hsTnI results.  Refer to the "Links" section for chest pain algorithms and additional  guidance. Performed at [facility], [address] [place], [place] [address]   Brain natriuretic peptide     Status: Abnormal   Collection Time: 10/07/22  9:20 PM  Result Value Ref Range   B Natriuretic Peptide 467.0 (H) 0.0 - 100.0 pg/mL    Comment: Performed at [facility], [address] [place], [place] [address]  SARS Coronavirus 2 by RT PCR (hospital order, performed in [facility] hospital lab) *cepheid single result test* Anterior Nasal Swab     Status: None   Collection Time: 10/07/22  9:31 PM   Specimen: Anterior Nasal Swab  Result Value Ref Range   SARS Coronavirus 2 by RT PCR NEGATIVE NEGATIVE    Comment: (NOTE) SARS-CoV-2 target nucleic acids are NOT DETECTED.  The SARS-CoV-2 RNA is generally detectable in upper and lower respiratory specimens during the acute phase of infection. The lowest concentration of SARS-CoV-2 viral copies this assay can detect is 250 copies / mL. A negative result does not preclude SARS-CoV-2 infection and should not be used as the sole basis for treatment or other patient management decisions.  A negative result may occur with improper specimen collection / handling, submission of specimen other than nasopharyngeal swab, presence of viral mutation(s) within  the areas targeted by this assay, and inadequate  number of viral copies (<250 copies / mL). A negative result must be combined with clinical observations, patient history, and epidemiological information.  Fact Sheet for Patients:   RoadLapTop.co.za  Fact Sheet for Healthcare Providers: http://kim-miller.com/  This test is not yet approved or  cleared by the [place] FDA and has been authorized for detection and/or diagnosis of SARS-CoV-2 by FDA under an Emergency Use Authorization (EUA).  This EUA will remain in effect (meaning this test can be used) for the duration of the COVID-19 declaration under Section 564(b)(1) of the Act, 21 U.S.C. section 360bbb-3(b)(1), unless the authorization is terminated or revoked sooner.  Performed at [facility], [address], [place], [place] [address]   I-stat chem 8, ED (not at [facility], [facility] or [facility])     Status: Abnormal   Collection Time: 10/07/22  9:32 PM  Result Value Ref Range   Sodium 117 (LL) 135 - 145 mmol/L   Potassium 4.1 3.5 - 5.1 mmol/L   Chloride 72 (L) 98 - 111 mmol/L   BUN 24 (H) 8 - 23 mg/dL   Creatinine, Ser 0.86 (H) 0.61 - 1.24 mg/dL   Glucose, Bld 578 (H) 70 - 99 mg/dL    Comment: Glucose reference range applies only to samples taken after fasting for at least 8 hours.   Calcium, Ion 1.06 (L) 1.15 - 1.40 mmol/L   TCO2 39 (H) 22 - 32 mmol/L   Hemoglobin 10.9 (L) 13.0 - 17.0 g/dL   HCT 46.9 (L) 62.9 - 52.8 %   Comment NOTIFIED PHYSICIAN   Urinalysis, w/ Reflex to Culture (Infection Suspected) -Urine, Clean Catch     Status: Abnormal   Collection Time: 10/07/22  9:49 PM  Result Value Ref Range   Specimen Source URINE, CLEAN CATCH    Color, Urine YELLOW YELLOW   APPearance CLOUDY (A) CLEAR   Specific Gravity, Urine 1.009 1.005 - 1.030   pH 7.0 5.0 - 8.0   Glucose, UA NEGATIVE NEGATIVE mg/dL   Hgb urine dipstick LARGE (A) NEGATIVE   Bilirubin Urine  NEGATIVE NEGATIVE   Ketones, ur NEGATIVE NEGATIVE mg/dL   Protein, ur 413 (A) NEGATIVE mg/dL   Nitrite POSITIVE (A) NEGATIVE   Leukocytes,Ua LARGE (A) NEGATIVE   RBC / HPF >50 0 - 5 RBC/hpf   WBC, UA >50 0 - 5 WBC/hpf    Comment:        Reflex urine culture not performed if WBC <=10, OR if Squamous epithelial cells >5. If Squamous epithelial cells >5 suggest recollection.    Bacteria, UA NONE SEEN NONE SEEN   Squamous Epithelial / HPF 0-5 0 - 5 /HPF   WBC Clumps PRESENT    Mucus PRESENT    Non Squamous Epithelial 0-5 (A) NONE SEEN    Comment: Performed at [facility], [address], [place], [place] [address]  Sodium, urine, random     Status: None   Collection Time: 10/07/22  9:50 PM  Result Value Ref Range   Sodium, Ur 37 mmol/L    Comment: Performed at [facility], [address] [place], [place] [address]  Creatinine, urine, random     Status: None   Collection Time: 10/07/22  9:50 PM  Result Value Ref Range   Creatinine, Urine 53 mg/dL    Comment: Performed at [facility], [address], [place], [place] [address]  Troponin I (High Sensitivity)     Status: Abnormal   Collection Time:  10/07/22 11:09 PM  Result Value Ref Range   Troponin I (High Sensitivity) 38 (H) <18 ng/L    Comment: (NOTE) Elevated high sensitivity troponin I (hsTnI) values and significant  changes across serial measurements may suggest ACS but many other  chronic and acute conditions are known to elevate hsTnI results.  Refer to the "Links" section for chest pain algorithms and additional  guidance. Performed at [facility], [address], [place], [place] [address]    CT Head Wo Contrast  Result Date: 10/07/2022 CLINICAL DATA:  Altered mental status.  History of dementia. EXAM: CT HEAD WITHOUT CONTRAST TECHNIQUE: Contiguous axial images were obtained from the base of the skull through the vertex without intravenous  contrast. RADIATION DOSE REDUCTION: This exam was performed according to the departmental dose-optimization program which includes automated exposure control, adjustment of the mA and/or kV according to patient size and/or use of iterative reconstruction technique. COMPARISON:  Head CT dated 08/01/2022. FINDINGS: Evaluation of this exam is limited due to respiratory motion artifact. Brain: Moderate age-related atrophy and chronic microvascular ischemic changes. There is no acute intracranial hemorrhage. No mass effect or midline shift. No extra-axial fluid collection. Vascular: No hyperdense vessel or unexpected calcification. Skull: Normal. Negative for fracture or focal lesion. Sinuses/Orbits: The visualized paranasal sinuses and the mastoid air cells are clear. Other: None IMPRESSION: 1. No acute intracranial pathology. 2. Moderate age-related atrophy and chronic microvascular ischemic changes. Electronically Signed   By: Elgie Collard M.D.   On: 10/07/2022 21:53   DG Chest Portable 1 View  Result Date: 10/07/2022 CLINICAL DATA:  Shortness of breath.  Altered mental status. EXAM: PORTABLE CHEST 1 VIEW COMPARISON:  09/10/2022 FINDINGS: Heart size and pulmonary vascularity are normal for technique. Probable emphysematous changes in the lungs. Slight interstitial pattern to the lung bases may represent mild edema or pneumonia. No pleural effusions. No pneumothorax. Mediastinal contours appear intact. Calcification of the aorta. IMPRESSION: 1. Emphysematous changes in the lungs. 2. Slight interstitial pattern to the lung bases appears progressed since prior study, possibly indicating early edema or pneumonia. Electronically Signed   By: Burman Nieves M.D.   On: 10/07/2022 21:20    Pending Labs Unresulted Labs (From admission, onward)     Start     Ordered   10/08/22 0030  Blood gas, venous  ONCE - STAT,   R        10/07/22 2227   10/07/22 2341  MRSA Next Gen by PCR, Nasal  Once,   R        10/07/22  2341   10/07/22 2149  Urine Culture  Once,   R        10/07/22 2149   10/07/22 2138  Osmolality  Once,   URGENT        10/07/22 2137   10/07/22 2138  Osmolality, urine  Once,   URGENT        10/07/22 2137            Vitals/Pain Today's Vitals   10/07/22 2335 10/07/22 2340 10/07/22 2350 10/07/22 2355  BP:   (!) 96/49   Pulse: 89 84 84 80  Resp: 18 17 17 18   Temp:      TempSrc:      SpO2: 100% 100% 100% 100%    Isolation Precautions No active isolations  Medications Medications  Chlorhexidine Gluconate Cloth 2 % PADS 6 each (has no administration in time range)  methylPREDNISolone sodium succinate (SOLU-MEDROL) 125 mg/2 mL injection  125 mg (125 mg Intravenous Given 10/07/22 2227)  albuterol (PROVENTIL) (2.5 MG/3ML) 0.083% nebulizer solution (15 mg/hr Nebulization Given 10/07/22 2220)  ipratropium (ATROVENT) nebulizer solution 1 mg (1 mg Nebulization Given 10/07/22 2220)  furosemide (LASIX) injection 40 mg (40 mg Intravenous Given 10/07/22 2227)    Mobility Unable to assess     Focused Assessments Pulmonary Assessment Handoff:  Lung sounds: Bilateral Breath Sounds: Diminished, Coarse crackles O2 Device: Bi-PAP      R Recommendations: See Admitting Provider Note  Report given to:   Additional Notes: Initial trop 32, repeat 38. Pt came in to ED due to family stating he was "more agitated than normal" and they said "he kept fighting [place]." Hx COPD and wears 3LO2 per son. Family states they increased his O2 to 5L today. Repeat VBG due at 0030.

## 2022-10-08 NOTE — Progress Notes (Signed)
   10/07/22 2351  BiPAP/CPAP/SIPAP  BiPAP/CPAP/SIPAP Pt Type Adult  BiPAP/CPAP/SIPAP V60  Mask Type Full face mask  Mask Size Large  Set Rate 18 breaths/min  Respiratory Rate 19 breaths/min  IPAP 18 cmH20  EPAP 6 cmH2O  FiO2 (%) 50 %  Minute Ventilation 9.4  Leak 12  Peak Inspiratory Pressure (PIP) 18  Tidal Volume (Vt) 523  BiPAP/CPAP /SiPAP Vitals  Resp 19  Bilateral Breath Sounds Diminished;Coarse crackles  MEWS Score/Color  MEWS Score 1  MEWS Score Color Gerald Valdez

## 2022-10-08 NOTE — Assessment & Plan Note (Signed)
Obtain electrolytes gently rehydrate patient noted to have hypotension after Lasix given will hold appears to be more on the dry side

## 2022-10-08 NOTE — Progress Notes (Signed)
OT Cancellation Note  Patient Details Name: Gerald Valdez MRN: [id number] DOB: 07-Aug-1928   Cancelled Treatment:    Reason Eval/Treat Not Completed: Other (comment). Pt's nurse reports pt is presenting with agitation and limited command follow. Will hold for now and follow-up as appropriate.    Reuben Likes, OTR/L 10/08/2022, 10:25 AM

## 2022-10-08 NOTE — Consult Note (Signed)
Consultation Note Date: 10/08/2022   Patient Name: Gerald Valdez  DOB: [birth year]  MRN: [id number]  Age / Sex: 89 y.o., male   PCP: [name], MD Referring Physician: Willeen Niece, MD  Reason for Consultation: Establishing goals of care     Chief Complaint/History of Present Illness:   Patient is a 89 year old male with a past medical history of mention, CHF, chronic indwelling Foley catheter, chronic hypoxic and hypercapnic respiratory failure secondary to COPD on supplemental nasal 2-3L cannula at baseline, hypertension, hyperlipidemia, CKD stage III, atrial fibrillation not on anticoagulation due to recent hematuria, chronic hyponatremia, history of C. difficile who was admitted on 10/07/2022 for management of altered mental status with worsening agitation.  EMS had been called and patient was not responsive at 7 PM upon their presentation.  During admission patient has received medical management for urinary tract infection and acute on chronic hypoxic and hypercapnic respiratory failure, x-ray showing concern for edema versus infiltrate.  Palliative medicine team consulted to assist with complex medical decision making. Of note, patient has been seen by palliative medicine team during prior hospitalizations.  Extensive review of EMR prior to seeing patient.  Discussed care with bedside RN and PCCM provider for medical updates.  Presented to bedside to meet with patient.  Patient appears agitated laying in the bed.  Patient's wife present at bedside.  Patient awake though moaning and grimacing at times.  Patient not actively able to participate in complex medical decision-making due to medical status including dementia.  Spoke with patient's wife who was present at bedside.  Spent time learning about patient's medical journey.  Wife notes they have been together for 68 years.  Inquired about support for patient in her room which she noted that they have 2 children who are a son and  daughter who are 20 and 3 years old also help to care for the patient when able.  Patient called at home with her.  Patient's appetite has been poor for years.  Wife described patient normally slept a great deal at home up in bedside recliner.  Patient able to interact at times though worsening confusion over time with dementia.  Spent time providing emotional support via active listening was seen patient's medical deterioration due to his medical conditions.   Discussed patient's medical status at this time.  Wife has heard concerns about BiPAP usage in a confused dementia patient which agreed could be an aspiration risk to patient.  Currently continuing antibiotics and nasal cannula support to determine if patient's medical status can improve.  Encouraged wife to talk with family about possible pathways for medical care moving forward.  Wife noted considering "comfort".  Spent time discussing what that could entail.  Wife noted that she does not want to make any decisions regarding care without discussing with her 2 children.  Greatly encouraged discussions as a family to best support patient's quality for life moving forward.  All questions answered at that time.  Noted palliative medicine team will continue to follow along with patient's medical journey.  Thanked wife for allowing me to visit with them today.  Primary Diagnoses  Present on Admission:  Acute respiratory failure (HCC)  Complicated UTI (urinary tract infection)  Acute metabolic encephalopathy  Chronic systolic CHF preserved EF 40 to 45% (congestive heart failure) (HCC)  Hyponatremia  COPD (chronic obstructive pulmonary disease) (HCC)  CAP (community acquired pneumonia)  Malignant neoplasm of overlapping sites of bladder (HCC)  AKI (acute kidney injury) (HCC)  Protein-calorie malnutrition,  moderate (HCC)  Acute on chronic respiratory failure with hypoxia and hypercapnia (HCC)  Chronic anemia   Palliative Review of  Systems: Grimacing and yelling at times  Past Medical History:  Diagnosis Date   Acute duodenal ulcer with bleeding 01/30/2014   Acute post-hemorrhagic anemia 01/30/2014   Anxiety    Aortic atherosclerosis (HCC) 02/10/2020   Arthritis    Basal cell carcinoma    Cancer (HCC)    bladder   Chronic kidney disease    COPD exacerbation (HCC) 02/10/2020   Coronary artery disease    Dyspnea    Dyspnea on exertion 02/10/2020   Emphysema of lung (HCC)    Essential hypertension 02/10/2020   GERD (gastroesophageal reflux disease) 02/10/2020   GI bleed 01/30/2014   Hyperlipidemia 02/10/2020   Social History   Socioeconomic History   Marital status: Married    Spouse name: Not on file   Number of children: Not on file   Years of education: Not on file   Highest education level: Not on file  Occupational History   Occupation: retired  Tobacco Use   Smoking status: Former    Current packs/day: 0.00    Types: Cigarettes    Start date: 1947    Quit date: 1994    Years since quitting: 30.6   Smokeless tobacco: Never  Vaping Use   Vaping status: Never Used  Substance and Sexual Activity   Alcohol use: Not Currently   Drug use: Never   Sexual activity: Not Currently  Other Topics Concern   Not on file  Social History Narrative   Not on file   Social Determinants of Health   Financial Resource Strain: Not on file  Food Insecurity: No Food Insecurity (09/10/2022)   Hunger Vital Sign    Worried About Running Out of Food in the Last Year: Never true    Ran Out of Food in the Last Year: Never true  Transportation Needs: No Transportation Needs (09/10/2022)   PRAPARE - Administrator, Civil Service (Medical): No    Lack of Transportation (Non-Medical): No  Physical Activity: Not on file  Stress: Not on file  Social Connections: Not on file   Family History  Problem Relation Age of Onset   Hypertension Mother    Stroke Father    Scheduled Meds:  Chlorhexidine  Gluconate Cloth  6 each Topical Daily   ipratropium-albuterol  3 mL Nebulization Q6H   pantoprazole (PROTONIX) IV  40 mg Intravenous QHS   rosuvastatin  20 mg Oral QPM   tamsulosin  0.4 mg Oral QPC supper   Continuous Infusions:  sodium chloride 75 mL/hr at 10/08/22 0800   azithromycin Stopped (10/08/22 0250)   cefTRIAXone (ROCEPHIN)  IV Stopped (10/08/22 0206)   PRN Meds:.acetaminophen **OR** acetaminophen, albuterol, ondansetron **OR** ondansetron (ZOFRAN) IV Allergies  Allergen Reactions   Prednisone Other (See Comments)    Unknown reaction   CBC:    Component Value Date/Time   WBC 7.6 10/08/2022 0725   HGB 8.6 (L) 10/08/2022 0725   HCT 27.1 (L) 10/08/2022 0725   PLT 209 10/08/2022 0725   MCV 92.2 10/08/2022 0725   NEUTROABS 8.9 (H) 10/07/2022 2118   LYMPHSABS 0.7 10/07/2022 2118   MONOABS 1.5 (H) 10/07/2022 2118   EOSABS 0.0 10/07/2022 2118   BASOSABS 0.0 10/07/2022 2118   Comprehensive Metabolic Panel:    Component Value Date/Time   NA 124 (L) 10/08/2022 0725   K 3.6 10/08/2022 0725   CL 75 (  L) 10/08/2022 0725   CO2 37 (H) 10/08/2022 0725   BUN 23 10/08/2022 0725   CREATININE 1.47 (H) 10/08/2022 0725   GLUCOSE 156 (H) 10/08/2022 0725   CALCIUM 8.1 (L) 10/08/2022 0725   AST 48 (H) 10/07/2022 2118   ALT 30 10/07/2022 2118   ALKPHOS 81 10/07/2022 2118   BILITOT 0.6 10/07/2022 2118   PROT 7.1 10/07/2022 2118   ALBUMIN 4.0 10/07/2022 2118    Physical Exam: Vital Signs: BP 120/64   Pulse 88   Temp (!) 97.3 F (36.3 C) (Axillary)   Resp (!) 22   SpO2 99%  SpO2: SpO2: 99 % O2 Device: O2 Device: Bi-PAP O2 Flow Rate:   Intake/output summary:  Intake/Output Summary (Last 24 hours) at 10/08/2022 0935 Last data filed at 10/08/2022 0800 Gross per 24 hour  Intake 612.54 ml  Output 870 ml  Net -257.46 ml   LBM: Last BM Date :  (PTA) Baseline Weight:   Most recent weight:    General: NAD, yelling out at times then falling sleep, chronically  ill-appearing Eyes: No drainage noted HENT: Dry mucous membranes Cardiovascular: Tachycardia noted Respiratory: Slightly increased work of breathing noted Skin: Multiple ecchymoses on lower extremities bilaterally b/l Neuro: Awake at times though overall lethargic Psych: Confused, agitated          Palliative Performance Scale: 10%              Additional Data Reviewed: Recent Labs    10/07/22 2118 10/07/22 2132 10/08/22 0121 10/08/22 0725  WBC 11.3*  --   --  7.6  HGB 10.4* 10.9*  --  8.6*  PLT 273  --   --  209  NA 120* 117* 122* 124*  BUN 21 24* 22 23  CREATININE 1.47* 1.70* 1.55* 1.47*    Imaging: CT Head Wo Contrast CLINICAL DATA:  Altered mental status.  History of dementia.  EXAM: CT HEAD WITHOUT CONTRAST  TECHNIQUE: Contiguous axial images were obtained from the base of the skull through the vertex without intravenous contrast.  RADIATION DOSE REDUCTION: This exam was performed according to the departmental dose-optimization program which includes automated exposure control, adjustment of the mA and/or kV according to patient size and/or use of iterative reconstruction technique.  COMPARISON:  Head CT dated 08/01/2022.  FINDINGS: Evaluation of this exam is limited due to respiratory motion artifact.  Brain: Moderate age-related atrophy and chronic microvascular ischemic changes. There is no acute intracranial hemorrhage. No mass effect or midline shift. No extra-axial fluid collection.  Vascular: No hyperdense vessel or unexpected calcification.  Skull: Normal. Negative for fracture or focal lesion.  Sinuses/Orbits: The visualized paranasal sinuses and the mastoid air cells are clear.  Other: None  IMPRESSION: 1. No acute intracranial pathology. 2. Moderate age-related atrophy and chronic microvascular ischemic changes.  Electronically Signed   By: Elgie Collard M.D.   On: 10/07/2022 21:53 DG Chest Portable 1 View CLINICAL DATA:   Shortness of breath.  Altered mental status.  EXAM: PORTABLE CHEST 1 VIEW  COMPARISON:  09/10/2022  FINDINGS: Heart size and pulmonary vascularity are normal for technique. Probable emphysematous changes in the lungs. Slight interstitial pattern to the lung bases may represent mild edema or pneumonia. No pleural effusions. No pneumothorax. Mediastinal contours appear intact. Calcification of the aorta.  IMPRESSION: 1. Emphysematous changes in the lungs. 2. Slight interstitial pattern to the lung bases appears progressed since prior study, possibly indicating early edema or pneumonia.  Electronically Signed   By: Chrissie Noa  Andria Meuse M.D.   On: 10/07/2022 21:20    I personally reviewed recent imaging.   Palliative Care Assessment and Plan Summary of Established Goals of Care and Medical Treatment Preferences   Patient is a 89 year old male with a past medical history of mention, CHF, chronic indwelling Foley catheter, chronic hypoxic and hypercapnic respiratory failure secondary to COPD on supplemental nasal 2-3L cannula at baseline, hypertension, hyperlipidemia, CKD stage III, atrial fibrillation not on anticoagulation due to recent hematuria, chronic hyponatremia, history of C. difficile who was admitted on 10/07/2022 for management of altered mental status with worsening agitation.  EMS had been called and patient was not responsive at 7 PM upon their presentation.  During admission patient has received medical management for urinary tract infection and acute on chronic hypoxic and hypercapnic respiratory failure, x-ray showing concern for edema versus infiltrate.  Palliative medicine team consulted to assist with complex medical decision making. Of note, patient has been seen by palliative medicine team during prior hospitalizations.  # Complex medical decision making/goals of care  -Patient unable to participate in complex medical decision making due to his underlying medical  status.  -Spoke with patient's wife at bedside as detailed above in HPI.  Discussed possible pathways for medical care moving forward.  Patient's wife has had prior discussions with palliative medicine team.  Wife continues to hope for "improvement".  Discussed encouraging conversation between wife and children about quality of life moving forward and how to best support this for the patient and family.  Allowing time for outcomes.  Patient already DNR/DNI.  Would not recommend use of BiPAP as patient is high aspiration risk.   -  Code Status: Limited: Do not attempt resuscitation (DNR) -DNR-LIMITED -Do Not Intubate/DNI    # Symptom management  -As per primary team  # Psycho-social/Spiritual Support:  - Support System: wife, son , daughter  # Discharge Planning:  To Be Determined  Thank you for allowing the palliative care team to participate in the care [name].  [name], DO Palliative Care Provider PMT # [phone]  If patient remains symptomatic despite maximum doses, please call PMT at [phone] between 0700 and 1900. Outside of these hours, please call attending, as PMT does not have night coverage.  This provider spent a total of 82 minutes providing patient's care.  Includes review of EMR, discussing care with other staff members involved in patient's medical care, obtaining relevant history and information from patient and/or patient's family, and personal review of imaging and lab work. Greater than 50% of the time was spent counseling and coordinating care related to the above assessment and plan.    *Please note that this is a verbal dictation therefore any spelling or grammatical errors are due to the "[employer]" system interpretation.

## 2022-10-08 NOTE — Progress Notes (Signed)
PROGRESS NOTE    Gerald Valdez  ZOX:[id number] DOB: 06-27-1928 DOA: 10/07/2022 PCP: [name], MD   Brief Narrative:  This 89 years old Male with PMH significant for combined CHF with LVEF 40 to 45%, Chronic indwelling Foley catheter, Chronic hypoxic and hypercapnic respiratory failure due to COPD, on 2 to 3 L of supplemental oxygen at baseline, hypertension, hyperlipidemia, CKD stage IIIa, atrial fibrillation not on anticoagulation due to recurrent hematuria, Chronic hyponatremia, history of C. difficile in the past, dementia presented in the ED with altered mentation, patient has been more agitated and Patient was found not responsive at 7 PM.  EMS was called.  Patient is found to have UTI,  requiring IV antibiotics, also was hypoxic and hypercapnic requiring BiPAP.  Patient is admitted for further evaluation.  Assessment & Plan:   Principal Problem:   Acute respiratory failure (HCC) Active Problems:   Complicated UTI (urinary tract infection)   Acute metabolic encephalopathy   Chronic systolic CHF preserved EF 40 to 45% (congestive heart failure) (HCC)   Hyponatremia   COPD (chronic obstructive pulmonary disease) (HCC)   Malignant neoplasm of overlapping sites of bladder (HCC)   AKI (acute kidney injury) (HCC)   Protein-calorie malnutrition, moderate (HCC)   Chronic anemia   Acute on chronic respiratory failure with hypoxia and hypercapnia (HCC)   CAP (community acquired pneumonia)  Acute metabolic encephalopathy: Acute on chronic hypoxic and hypercapnic respiratory failure: This has been a recurrent issue in the setting of hypercapnia/ hypoxia. Patient was hypoxic and hypercapnic , now requiring BiPAP. Continue BiPAP as needed, wean as tolerated. CT head unremarkable.  Chest x-ray shows infiltrate. Continue supplemental oxygen and wean as tolerated. Discussed with family, would not tolerate MRI given confusion and significant dementia.   Family wants to hold off on MRI  at this time.  Complicated UTI: ID felt that this was more colonization.  UA shows no evidence of bacteria. Continue current antibiotics.  Chronic combined systolic and diastolic CHF: Patient was initially given a dose of Lasix, his blood pressure went down.   He does not appear to be fluid overloaded at this time. There is no peripheral edema noted. Continue to monitor clinical status.  Hold off on further Lasix.  Hyponatremia: Chronic recurrent issue, Patient has responded in the past with gentle hydration. Patient appears clinically dry with AKI.   Continue IV hydration and monitor serum creatinine  COPD:  No wheezing or history of cough. Chest x-ray did show possibility of infiltrate. Continue home inhalers and DuoNeb as needed.  Community-acquired pneumonia: Chest x-ray did show possibility of infiltrate. Continue IV Rocephin and Zithromax. Obtain strep pneumo and Legionella urinary antigen.  Acute kidney injury: > Improving Baseline serum creatinine 1.2, presented with creatinine 1.47. Continue IV hydration.  Monitor serum creatinine  Protein calorie malnutrition: Obtain nutritional consult.  Goals of care discussion: Palliative care consulted to discuss goals of care.  DVT prophylaxis: SCDs Code Status: DNR Family Communication: Wife at bed side. Disposition Plan:   Status is: Inpatient Remains inpatient appropriate because: Patient admitted for acute on chronic hypoxic and hypercapnic respiratory failure due to COPD and pneumonia, now requiring antibiotics and BIPAP.  Consultants:  None  Procedures: BIPAP  Antimicrobials:  Anti-infectives (From admission, onward)    Start     Dose/Rate Route Frequency Ordered Stop   10/08/22 0515  cefTRIAXone (ROCEPHIN) 1 g in sodium chloride 0.9 % 100 mL IVPB  Status:  Discontinued        1  g 200 mL/hr over 30 Minutes Intravenous Every 24 hours 10/08/22 0415 10/08/22 0421   10/08/22 0200  cefTRIAXone (ROCEPHIN) 2 g  in sodium chloride 0.9 % 100 mL IVPB        2 g 200 mL/hr over 30 Minutes Intravenous Every 24 hours 10/08/22 0056 10/13/22 0159   10/08/22 0200  azithromycin (ZITHROMAX) 500 mg in sodium chloride 0.9 % 250 mL IVPB        500 mg 250 mL/hr over 60 Minutes Intravenous Every 24 hours 10/08/22 0056 10/13/22 0159      Subjective: Patient was seen and examined at bedside.  Overnight events noted.   Patient remains restless and  confused, remains on BiPAP.  Objective: Vitals:   10/08/22 0700 10/08/22 0800 10/08/22 0848 10/08/22 1150  BP: (!) 99/45 120/64 120/64 (!) 119/57  Pulse: 79 (!) 119 88 98  Resp: 16 (!) 24 (!) 22 20  Temp:      TempSrc:      SpO2: 100% 100% 99% 99%    Intake/Output Summary (Last 24 hours) at 10/08/2022 1201 Last data filed at 10/08/2022 0800 Gross per 24 hour  Intake 612.54 ml  Output 870 ml  Net -257.46 ml   There were no vitals filed for this visit.  Examination:  General exam: Appears restless, confused, remains on BiPAP. Respiratory system: Decreased breath sounds. Respiratory effort normal.  No accessory muscle use, RR 16 Cardiovascular system: S1 & S2 heard, RRR. No JVD, murmurs, rubs, gallops or clicks. No pedal edema. Gastrointestinal system: Abdomen is non distended, soft and non tender. Normal bowel sounds heard. Central nervous system: Confused, restless. No focal neurological deficits. Extremities: No edema, no cyanosis, no clubbing Skin: No rashes, lesions or ulcers Psychiatry: Judgement and insight appear normal. Mood & affect appropriate.     Data Reviewed: I have personally reviewed following labs and imaging studies  CBC: Recent Labs  Lab 10/07/22 2118 10/07/22 2132 10/08/22 0725  WBC 11.3*  --  7.6  NEUTROABS 8.9*  --   --   HGB 10.4* 10.9* 8.6*  HCT 33.5* 32.0* 27.1*  MCV 92.5  --  92.2  PLT 273  --  209   Basic Metabolic Panel: Recent Labs  Lab 10/07/22 2118 10/07/22 2132 10/07/22 2309 10/08/22 0121 10/08/22 0725   NA 120* 117*  --  122* 124*  K 4.3 4.1  --  3.8 3.6  CL 72* 72*  --  73* 75*  CO2 35*  --   --  36* 37*  GLUCOSE 197* 188*  --  175* 156*  BUN 21 24*  --  22 23  CREATININE 1.47* 1.70*  --  1.55* 1.47*  CALCIUM 8.7*  --   --  8.5* 8.1*  MG  --   --  2.2  --   --   PHOS  --   --  4.1  --   --    GFR: CrCl cannot be calculated (Unknown ideal weight.). Liver Function Tests: Recent Labs  Lab 10/07/22 2118  AST 48*  ALT 30  ALKPHOS 81  BILITOT 0.6  PROT 7.1  ALBUMIN 4.0   No results for input(s): "LIPASE", "AMYLASE" in the last 168 hours. No results for input(s): "AMMONIA" in the last 168 hours. Coagulation Profile: No results for input(s): "INR", "PROTIME" in the last 168 hours. Cardiac Enzymes: Recent Labs  Lab 10/07/22 2309  CKTOTAL 237   BNP (last 3 results) No results for input(s): "PROBNP" in the last 8760 hours.  HbA1C: No results for input(s): "HGBA1C" in the last 72 hours. CBG: No results for input(s): "GLUCAP" in the last 168 hours. Lipid Profile: No results for input(s): "CHOL", "HDL", "LDLCALC", "TRIG", "CHOLHDL", "LDLDIRECT" in the last 72 hours. Thyroid Function Tests: Recent Labs    10/08/22 0121  TSH 2.992   Anemia Panel: Recent Labs    10/08/22 0121  VITAMINB12 930*  FOLATE 30.3  FERRITIN 139  TIBC 351  IRON 23*  RETICCTPCT 2.3   Sepsis Labs: Recent Labs  Lab 10/07/22 2309  PROCALCITON <0.10    Recent Results (from the past 240 hour(s))  SARS Coronavirus 2 by RT PCR (hospital order, performed in [facility] hospital lab) *cepheid single result test* Anterior Nasal Swab     Status: None   Collection Time: 10/07/22  9:31 PM   Specimen: Anterior Nasal Swab  Result Value Ref Range Status   SARS Coronavirus 2 by RT PCR NEGATIVE NEGATIVE Final    Comment: (NOTE) SARS-CoV-2 target nucleic acids are NOT DETECTED.  The SARS-CoV-2 RNA is generally detectable in upper and lower respiratory specimens during the acute phase of infection. The  lowest concentration of SARS-CoV-2 viral copies this assay can detect is 250 copies / mL. A negative result does not preclude SARS-CoV-2 infection and should not be used as the sole basis for treatment or other patient management decisions.  A negative result may occur with improper specimen collection / handling, submission of specimen other than nasopharyngeal swab, presence of viral mutation(s) within the areas targeted by this assay, and inadequate number of viral copies (<250 copies / mL). A negative result must be combined with clinical observations, patient history, and epidemiological information.  Fact Sheet for Patients:   RoadLapTop.co.za  Fact Sheet for Healthcare Providers: http://kim-miller.com/  This test is not yet approved or  cleared by the [place] FDA and has been authorized for detection and/or diagnosis of SARS-CoV-2 by FDA under an Emergency Use Authorization (EUA).  This EUA will remain in effect (meaning this test can be used) for the duration of the COVID-19 declaration under Section 564(b)(1) of the Act, 21 U.S.C. section 360bbb-3(b)(1), unless the authorization is terminated or revoked sooner.  Performed at [facility], [address] [place], [place] [address]   MRSA Next Gen by PCR, Nasal     Status: None   Collection Time: 10/08/22  1:16 AM   Specimen: Nasal Mucosa; Nasal Swab  Result Value Ref Range Status   MRSA by PCR Next Gen NOT DETECTED NOT DETECTED Final    Comment: (NOTE) The GeneXpert MRSA Assay (FDA approved for NASAL specimens only), is one component of a comprehensive MRSA colonization surveillance program. It is not intended to diagnose MRSA infection nor to guide or monitor treatment for MRSA infections. Test performance is not FDA approved in patients less than 54 years old. Performed at [facility], [address] [place], [place]  [address]    Radiology Studies: CT Head Wo Contrast  Result Date: 10/07/2022 CLINICAL DATA:  Altered mental status.  History of dementia. EXAM: CT HEAD WITHOUT CONTRAST TECHNIQUE: Contiguous axial images were obtained from the base of the skull through the vertex without intravenous contrast. RADIATION DOSE REDUCTION: This exam was performed according to the departmental dose-optimization program which includes automated exposure control, adjustment of the mA and/or kV according to patient size and/or use of iterative reconstruction technique. COMPARISON:  Head CT dated 08/01/2022. FINDINGS: Evaluation of this exam is limited due to  respiratory motion artifact. Brain: Moderate age-related atrophy and chronic microvascular ischemic changes. There is no acute intracranial hemorrhage. No mass effect or midline shift. No extra-axial fluid collection. Vascular: No hyperdense vessel or unexpected calcification. Skull: Normal. Negative for fracture or focal lesion. Sinuses/Orbits: The visualized paranasal sinuses and the mastoid air cells are clear. Other: None IMPRESSION: 1. No acute intracranial pathology. 2. Moderate age-related atrophy and chronic microvascular ischemic changes. Electronically Signed   By: Elgie Collard M.D.   On: 10/07/2022 21:53   DG Chest Portable 1 View  Result Date: 10/07/2022 CLINICAL DATA:  Shortness of breath.  Altered mental status. EXAM: PORTABLE CHEST 1 VIEW COMPARISON:  09/10/2022 FINDINGS: Heart size and pulmonary vascularity are normal for technique. Probable emphysematous changes in the lungs. Slight interstitial pattern to the lung bases may represent mild edema or pneumonia. No pleural effusions. No pneumothorax. Mediastinal contours appear intact. Calcification of the aorta. IMPRESSION: 1. Emphysematous changes in the lungs. 2. Slight interstitial pattern to the lung bases appears progressed since prior study, possibly indicating early edema or pneumonia. Electronically  Signed   By: Burman Nieves M.D.   On: 10/07/2022 21:20    Scheduled Meds:  Chlorhexidine Gluconate Cloth  6 each Topical Daily   ipratropium-albuterol  3 mL Nebulization Q6H   pantoprazole (PROTONIX) IV  40 mg Intravenous QHS   rosuvastatin  20 mg Oral QPM   tamsulosin  0.4 mg Oral QPC supper   Continuous Infusions:  sodium chloride 75 mL/hr at 10/08/22 0800   azithromycin Stopped (10/08/22 0250)   cefTRIAXone (ROCEPHIN)  IV Stopped (10/08/22 0206)     LOS: 1 day    Time spent: 50 mins    Willeen Niece, MD [facility]   If 7PM-7AM, please contact night-coverage

## 2022-10-08 NOTE — Assessment & Plan Note (Addendum)
Chronic recurrent obtain your electrolytes patient has responded in the past for gentle rehydration He appears to to have decreased p.o. intake and appears dry with AKI.  Continue to monitor serial Bmet ordered Given chronic hyponatremia without avoid aggressive overcorrection

## 2022-10-08 NOTE — Progress Notes (Signed)
Initial Nutrition Assessment  DOCUMENTATION CODES:   Severe malnutrition in context of chronic illness  INTERVENTION:  - Advance diet as medically appropriate. - Recommend Ensure Plus High Protein po TID once diet advanced. Each supplement provides 350 kcal and 20 grams of protein. - Recommend Multivitamin with minerals daily. - Monitor weight trends.   - No weight this admission, ordered.  NUTRITION DIAGNOSIS:   Severe Malnutrition related to chronic illness (CHF; COPD; CKD) as evidenced by severe fat depletion, severe muscle depletion.  GOAL:   Patient will meet greater than or equal to 90% of their needs  MONITOR:   Diet advancement, Weight trends  REASON FOR ASSESSMENT:   Consult Assessment of nutrition requirement/status  ASSESSMENT:   89 years old Male with PMH significant for combined CHF, Chronic hypoxic and hypercapnic respiratory failure due to COPD, HTN, HLD, CKD stage IIIa, Chronic hyponatremia, history of C. difficile in the past, dementia who presented with altered mentation. Found to have UTI.  Patient on BiPAP at time of visit, wife at bedside provided all nutrition history.   Recent UBW reported to be 117# however wife states patient has been losing weight for 2 years.   Per EMR, patient weighed at 131# in November and weight appeared stable until June/July. Patient weighed at 140# on 6/28 and then 120# on 7/7. He was then weighed at 129# on 8/5 and there has been no weight since (no weight taken this admission). Therefore, accurate weight status and trends difficult to assess at this time. Ordered weight.  Wife reports patient's appetite has been poor for several months. Shares she was never a big eater but prior to the last few months would usually eat a small breakfast, a snack around lunch time, and a small dinner. Over the past 1 month, he has been eating hardly nothing. She admits he is a picky eater at baseline. He will occasionally accept chocolate  Ensure at home.   Patient currently NPO due to being on BiPAP. Plan to try and wean BiPAP as able. Recommend Ensure TID once diet able to be advanced.  Palliative care is now consulted.    Medications reviewed and include: -  Labs reviewed:  Na 124 Creatinine 1.47   NUTRITION - FOCUSED PHYSICAL EXAM:  Flowsheet Row Most Recent Value  Orbital Region Severe depletion  Upper Arm Region Moderate depletion  Thoracic and Lumbar Region Mild depletion  Buccal Region Severe depletion  Temple Region Severe depletion  Clavicle Bone Region Severe depletion  Clavicle and Acromion Bone Region Severe depletion  Scapular Bone Region Unable to assess  Dorsal Hand Unable to assess  Patellar Region Moderate depletion  Anterior Thigh Region Moderate depletion  Posterior Calf Region Moderate depletion  Edema (RD Assessment) None  Hair Reviewed  Eyes Reviewed  Mouth Reviewed  Skin Reviewed  Nails Reviewed       Diet Order:   Diet Order             Diet NPO time specified  Diet effective now                   EDUCATION NEEDS:  Education needs have been addressed  Skin:  Skin Assessment: Reviewed RN Assessment  Last BM:  PTA  Height:  Ht Readings from Last 1 Encounters:  09/10/22 5\' 6"  (1.676 m)   Weight:  Wt Readings from Last 1 Encounters:  09/13/22 58.9 kg   Ideal Body Weight:  64.55 kg  BMI:  There is no  height or weight on file to calculate BMI.  Estimated Nutritional Needs:  Kcal:  1650-1900 kcals Protein:  80-95 grams Fluid:  >/= 1.6L    Shelle Iron RD, LDN For contact information, refer to [facility].

## 2022-10-09 DIAGNOSIS — J9601 Acute respiratory failure with hypoxia: Secondary | ICD-10-CM | POA: Diagnosis not present

## 2022-10-09 DIAGNOSIS — Z515 Encounter for palliative care: Secondary | ICD-10-CM | POA: Diagnosis not present

## 2022-10-09 DIAGNOSIS — J9602 Acute respiratory failure with hypercapnia: Secondary | ICD-10-CM | POA: Diagnosis not present

## 2022-10-09 DIAGNOSIS — F0390 Unspecified dementia without behavioral disturbance: Secondary | ICD-10-CM

## 2022-10-09 DIAGNOSIS — Z7189 Other specified counseling: Secondary | ICD-10-CM | POA: Diagnosis not present

## 2022-10-09 DIAGNOSIS — R4589 Other symptoms and signs involving emotional state: Secondary | ICD-10-CM | POA: Diagnosis not present

## 2022-10-09 DIAGNOSIS — E871 Hypo-osmolality and hyponatremia: Secondary | ICD-10-CM | POA: Diagnosis not present

## 2022-10-09 LAB — BASIC METABOLIC PANEL
Anion gap: 9 (ref 5–15)
BUN: 21 mg/dL (ref 8–23)
CO2: 35 mmol/L — ABNORMAL HIGH (ref 22–32)
Calcium: 8.4 mg/dL — ABNORMAL LOW (ref 8.9–10.3)
Chloride: 84 mmol/L — ABNORMAL LOW (ref 98–111)
Creatinine, Ser: 1.32 mg/dL — ABNORMAL HIGH (ref 0.61–1.24)
GFR, Estimated: 51 mL/min — ABNORMAL LOW (ref >60)
Glucose, Bld: 123 mg/dL — ABNORMAL HIGH (ref 70–99)
Potassium: 3.4 mmol/L — ABNORMAL LOW (ref 3.5–5.1)
Sodium: 128 mmol/L — ABNORMAL LOW (ref 135–145)

## 2022-10-09 LAB — URINE CULTURE

## 2022-10-09 MED ORDER — POTASSIUM CHLORIDE 20 MEQ PO PACK
40.0000 meq | PACK | Freq: Once | ORAL | Status: AC
  Administered 2022-10-09: 40 meq via ORAL
  Filled 2022-10-09: qty 2

## 2022-10-09 NOTE — TOC Progression Note (Signed)
Transition of Care [facility]) - Progression Note    Patient Details  Name: Gerald Valdez MRN: [id number] Date of Birth: 06-Dec-1928  Transition of Care [facility]) CM/SW Contact  Adrian Prows, RN Phone Number: 10/09/2022, 3:07 PM  Clinical Narrative:    Pt disoriented; no family at bedside; unable to complete TOC assessment.        Expected Discharge Plan and Services                                               Social Determinants of Health (SDOH) Interventions SDOH Screenings   Food Insecurity: No Food Insecurity (09/10/2022)  Housing: Low Risk  (09/10/2022)  Transportation Needs: No Transportation Needs (09/10/2022)  Utilities: Not At Risk (09/10/2022)  Tobacco Use: Medium Risk (09/10/2022)    Readmission Risk Interventions    09/13/2022   12:21 PM 08/11/2022   12:01 PM 07/27/2022    4:19 PM  Readmission Risk Prevention Plan  Transportation Screening Complete Complete Complete  PCP or Specialist Appt within 3-5 Days  Complete Complete  HRI or Home Care Consult  Complete   Social Work Consult for Recovery Care Planning/Counseling  Complete Complete  Palliative Care Screening  Complete Complete  Medication Review [employer]) Complete Referral to Pharmacy Complete  PCP or Specialist appointment within 3-5 days of discharge Complete    HRI or Home Care Consult Complete    SW Recovery Care/Counseling Consult Complete    Palliative Care Screening Not Applicable    Skilled Nursing Facility Not Applicable

## 2022-10-09 NOTE — Evaluation (Signed)
Clinical/Bedside Swallow Evaluation Patient Details  Name: KEYSON HARTZEL MRN: [id number] Date of Birth: 10-31-1928  Today's Date: 10/09/2022 Time: SLP Start Time (ACUTE ONLY): 1127 SLP Stop Time (ACUTE ONLY): 1140 SLP Time Calculation (min) (ACUTE ONLY): 13 min  Past Medical History:  Past Medical History:  Diagnosis Date   Acute duodenal ulcer with bleeding 01/30/2014   Acute post-hemorrhagic anemia 01/30/2014   Anxiety    Aortic atherosclerosis (HCC) 02/10/2020   Arthritis    Basal cell carcinoma    Cancer (HCC)    bladder   Chronic kidney disease    COPD exacerbation (HCC) 02/10/2020   Coronary artery disease    Dyspnea    Dyspnea on exertion 02/10/2020   Emphysema of lung (HCC)    Essential hypertension 02/10/2020   GERD (gastroesophageal reflux disease) 02/10/2020   GI bleed 01/30/2014   Hyperlipidemia 02/10/2020   Past Surgical History:  Past Surgical History:  Procedure Laterality Date   BLADDER SURGERY     CATARACT EXTRACTION W/ INTRAOCULAR LENS IMPLANT     CYSTOSCOPY W/ RETROGRADES Bilateral 11/20/2020   Procedure: CYSTOSCOPY WITH RETROGRADE PYELOGRAM;  Surgeon: Marcine Matar, MD;  Location: [facility];  Service: Urology;  Laterality: Bilateral;   CYSTOSCOPY W/ RETROGRADES Right 02/12/2021   Procedure: CYSTOSCOPY WITH RETROGRADE PYELOGRAM;  Surgeon: Marcine Matar, MD;  Location: [facility];  Service: Urology;  Laterality: Right;   CYSTOSCOPY W/ RETROGRADES Bilateral 06/25/2021   Procedure: CYSTOSCOPY WITH RETROGRADE PYELOGRAM;  Surgeon: Marcine Matar, MD;  Location: [facility];  Service: Urology;  Laterality: Bilateral;   ESOPHAGOGASTRODUODENOSCOPY N/A 01/30/2014   Procedure: ESOPHAGOGASTRODUODENOSCOPY (EGD);  Surgeon: Louis Meckel, MD;  Location: [facility];  Service: Endoscopy;  Laterality: N/A;   TONSILLECTOMY     TRANSURETHRAL RESECTION OF BLADDER TUMOR N/A 02/12/2021   Procedure: REPEAT TRANSURETHRAL RESECTION OF BLADDER TUMOR (TURBT);  Surgeon:  Marcine Matar, MD;  Location: [facility];  Service: Urology;  Laterality: N/A;   TRANSURETHRAL RESECTION OF BLADDER TUMOR WITH MITOMYCIN-C N/A 11/20/2020   Procedure: TRANSURETHRAL RESECTION OF BLADDER TUMOR;  Surgeon: Marcine Matar, MD;  Location: [facility];  Service: Urology;  Laterality: N/A;   TRANSURETHRAL RESECTION OF BLADDER TUMOR WITH MITOMYCIN-C N/A 06/25/2021   Procedure: TRANSURETHRAL RESECTION OF BLADDER TUMOR WITH GEMCITABINE;  Surgeon: Marcine Matar, MD;  Location: [facility];  Service: Urology;  Laterality: N/A;  1 HR   TRANSURETHRAL RESECTION OF BLADDER TUMOR WITH MITOMYCIN-C N/A 12/10/2021   Procedure: TRANSURETHRAL RESECTION OF BLADDER TUMOR WITH GEMCITABINE;  Surgeon: Marcine Matar, MD;  Location: [facility];  Service: Urology;  Laterality: N/A;  1 HR   TRANSURETHRAL RESECTION OF BLADDER TUMOR WITH MITOMYCIN-C N/A 05/27/2022   Procedure: TRANSURETHRAL RESECTION OF BLADDER TUMOR;  Surgeon: Marcine Matar, MD;  Location: [facility];  Service: Urology;  Laterality: N/A;   HPI:  ARTEZ RIGONI is a 89 y.o. male with medical history significant of CHF, chronic Foley catheter, chronic hypoxemic and hypercapnic respiratory failure due to COPD on 2-3 L Fio2 combined systolic and diastolic heart failure with known ejection fraction 40 to 45%, hypertension, hyperlipidemia, CKD stage IIIa and atrial fibrillation not on Eliquis due to recurrent hematuria Hyponatremia, history of C. difficile in the past. dementia. Per chart treated recently for UTI. Pulmonolgy states chronic hypercarbic/hypoxemic respiratory failure, unclear triggering event, versus aspiration versus other inflammatory trigger for exacerbation of COPD/emphysema. BSE 09/11/22 with grossly functional oropharyngeal swallow, belching exhibited with regular/thin recommended  Palliative care consult, ongoing discussion about hospice at discharge.    Assessment /  Plan / Recommendation  Clinical Impression  Pt presents with a  cognitive dysphagia with wife at bedside. Prior to this admission he was eating regular texture/thin liquids. Presently pt with eyes closed, he has decreased awareness of surroundings, constantly talking (spelling words) and not following commands (had been given meds to help his sleep this morning per wife). Due to cognitive impairments, solid texture not given during assessment and upper plate and lower partial were not placed. He was accepting of applesauce and able to transit and initiate fairly timely swallow. Given additional time he could use the straw to express water. There were no s/s aspiration but consistent eructation indicative of possible esophageal involvement (hx of GERD). Risk of aspiration higher with cognitive impairments. Recommend Dys 1 (puree) diet, thin liquids when alert, meds crushed in puree. Can place dentures when pt is more alert.  Pt is being followed by Palliative care and hospice may be considered per chart documentation. ST will follow for safety and potential upgrade in diet. SLP Visit Diagnosis: Dysphagia, unspecified (R13.10)    Aspiration Risk  Mild aspiration risk    Diet Recommendation Dysphagia 1 (Puree);Thin liquid    Liquid Administration via: Straw Medication Administration: Crushed with puree Supervision: Staff to assist with self feeding;Full supervision/cueing for compensatory strategies Compensations: Slow rate;Small sips/bites Postural Changes: Seated upright at 90 degrees;Remain upright for at least 30 minutes after po intake    Other  Recommendations Oral Care Recommendations: Oral care BID    Recommendations for follow up therapy are one component of a multi-disciplinary discharge planning process, led by the attending physician.  Recommendations may be updated based on patient status, additional functional criteria and insurance authorization.  Follow up Recommendations  (TBD)      Assistance Recommended at Discharge    Functional Status  Assessment Patient has had a recent decline in their functional status and demonstrates the ability to make significant improvements in function in a reasonable and predictable amount of time.  Frequency and Duration min 2x/week  2 weeks       Prognosis Prognosis for improved oropharyngeal function: Fair Barriers to Reach Goals: Cognitive deficits      Swallow Study   General Date of Onset: 10/07/22 HPI: LUISDAVID BOUWMAN is a 89 y.o. male with medical history significant of CHF, chronic Foley catheter, chronic hypoxemic and hypercapnic respiratory failure due to COPD on 2-3 L Fio2 combined systolic and diastolic heart failure with known ejection fraction 40 to 45%, hypertension, hyperlipidemia, CKD stage IIIa and atrial fibrillation not on Eliquis due to recurrent hematuria Hyponatremia, history of C. difficile in the past. dementia. Per chart treated recently for UTI. Pulmonolgy states chronic hypercarbic/hypoxemic respiratory failure, unclear triggering event, versus aspiration versus other inflammatory trigger for exacerbation of COPD/emphysema. BSE 09/11/22 with grossly functional oropharyngeal swallow, belching exhibited with regular/thin recommended  Palliative care consult, ongoing discussion about hospice at discharge. Type of Study: Bedside Swallow Evaluation Previous Swallow Assessment:  (see HPI) Diet Prior to this Study: NPO Temperature Spikes Noted: No Respiratory Status: Nasal cannula History of Recent Intubation: No Behavior/Cognition: Lethargic/Drowsy;Requires cueing Oral Cavity Assessment: Dry Oral Care Completed by SLP: No Oral Cavity - Dentition:  (has partial- not placed for eval) Vision:  (kept eyes closed) Self-Feeding Abilities: Total assist Patient Positioning: Upright in bed Baseline Vocal Quality: Normal Volitional Cough: Cognitively unable to elicit Volitional Swallow: Unable to elicit    Oral/Motor/Sensory Function Overall Oral Motor/Sensory Function:  (unable  to participate)   Ice Chips Ice chips:  Not tested   Thin Liquid Thin Liquid: Within functional limits Presentation: Straw    Nectar Thick Nectar Thick Liquid: Not tested   Honey Thick Honey Thick Liquid: Not tested   Puree Puree: Within functional limits Presentation: Spoon   Solid     Solid: Not tested      Royce Macadamia 10/09/2022,12:01 PM

## 2022-10-09 NOTE — Evaluation (Signed)
Occupational Therapy Evaluation Patient Details Name: LYNNE VALLAS MRN: [id number] DOB: 1928-05-10 Today's Date: 10/09/2022   History of Present Illness patient is a 89 year old male who presented to the emergency room on 8/29 with hypoxia, hematuria and change in mental status. Patient was admitted with acute on chronic hypoxic and hypercarbic respiratory failure due to pneumonia, sepsis, agitated delirium. PMHx: bladder cancer on chronic Foley, chronic hypoxic and hypercarbic respiratory failure on 3L O2, COPD, HFpEF, HTN, HLD, chronic anemia, CKD, a-fib on Eliquis   Clinical Impression   Patient is a 89 year old male who was admitted for above. Patient was living at home with 24/7 caregiver/family support at home since last hospitalization. Patient was noted to have increased difficulty with communication and participation in movement compared to last evaluation on 6/12 with this therapist. Goal of care meeting has yet to happen at this time. OT to continue to follow for recommendations for next venue pending family decisions.       If plan is discharge home, recommend the following: Two people to help with walking and/or transfers;Two people to help with bathing/dressing/bathroom;Assistance with cooking/housework;Direct supervision/assist for medications management;Assist for transportation;Help with stairs or ramp for entrance;Direct supervision/assist for financial management;Assistance with feeding    Functional Status Assessment  Patient has had a recent decline in their functional status and/or demonstrates limited ability to make significant improvements in function in a reasonable and predictable amount of time  Equipment Recommendations  None recommended by OT       Precautions / Restrictions Precautions Precautions: Fall Precaution Comments: on O2 Restrictions Weight Bearing Restrictions: No      Mobility Bed Mobility Overal bed mobility: Needs Assistance Bed Mobility:  Supine to Sit, Sit to Supine     Supine to sit: Max assist, +2 for physical assistance, +2 for safety/equipment Sit to supine: +2 for safety/equipment, +2 for physical assistance, Max assist   General bed mobility comments: multimodal cues to move  legs , assisted trunk        Balance Overall balance assessment: Needs assistance Sitting-balance support: Feet supported, Bilateral upper extremity supported Sitting balance-Leahy Scale: Fair Sitting balance - Comments: initally poor, did gain some control to sit near midline briefly         ADL either performed or assessed with clinical judgement   ADL Overall ADL's : Needs assistance/impaired Eating/Feeding: Maximal assistance;Bed level   Grooming: Bed level;Total assistance   Upper Body Bathing: Bed level;Total assistance   Lower Body Bathing: Bed level;Total assistance   Upper Body Dressing : Bed level;Total assistance   Lower Body Dressing: Bed level;Total assistance     Toilet Transfer Details (indicate cue type and reason): patient was +2 for supine to sit EOB with increased time and patient able to sit with min A EOB with increased time. Toileting- Clothing Manipulation and Hygiene: Total assistance;Bed level               Vision   Additional Comments: patient keeping eyes closed during session with cues to open them. when sitting EOB kept eyes open more            Pertinent Vitals/Pain Pain Assessment Pain Assessment: Faces Faces Pain Scale: No hurt     Extremity/Trunk Assessment Upper Extremity Assessment Upper Extremity Assessment: Difficult to assess due to impaired cognition   Lower Extremity Assessment Lower Extremity Assessment: Defer to PT evaluation   Cervical / Trunk Assessment Cervical / Trunk Assessment: Kyphotic   Communication Communication Communication: Hearing  impairment   Cognition Arousal: Lethargic Behavior During Therapy: Restless Overall Cognitive Status:  Impaired/Different from baseline Area of Impairment: Orientation, Attention, Following commands       Current Attention Level: Focused   Following Commands: Follows one step commands inconsistently       General Comments: patient was noted to have some garbled speech with wife abl to understand patient at times. wife present during session. patient unable to state who wife was. "my aunt" when looking at wife.                Home Living Family/patient expects to be discharged to:: Private residence Living Arrangements: Spouse/significant other Available Help at Discharge: Family;Available PRN/intermittently Type of Home: House Home Access: Ramped entrance     Home Layout: One level     Bathroom Shower/Tub: Tub/shower unit         Home Equipment: Rollator (4 wheels);Rolling Walker (2 wheels);Tub bench;BSC/3in1;Hand held shower head;Wheelchair - manual   Additional Comments: Pt wears 3L at baseline. Has concentrator at home      Prior Functioning/Environment Prior Level of Function : Needs assist  Cognitive Assist : Mobility (cognitive);ADLs (cognitive) Mobility (Cognitive): Step by step cues ADLs (Cognitive): Step by step cues Physical Assist : Mobility (physical)     Mobility Comments: per wife, able to  guide patient to stand and pivot to [facility] and toilet.nonambulatory lately ADLs Comments: patient has TD for LB Dressing, with patient able to help some with UB        OT Problem List: Decreased activity tolerance;Impaired balance (sitting and/or standing);Decreased coordination;Decreased safety awareness;Decreased knowledge of precautions;Decreased knowledge of use of DME or AE      OT Treatment/Interventions: Self-care/ADL training;Balance training;Patient/family education;Therapeutic activities;DME and/or AE instruction    OT Goals(Current goals can be found in the care plan section) Acute Rehab OT Goals OT Goal Formulation: Patient unable to participate in  goal setting Time For Goal Achievement: 10/23/22 Potential to Achieve Goals: Poor  OT Frequency: Min 1X/week    Co-evaluation PT/OT/SLP Co-Evaluation/Treatment: Yes Reason for Co-Treatment: For patient/therapist safety;To address functional/ADL transfers PT goals addressed during session: Mobility/safety with mobility OT goals addressed during session: ADL's and self-care      AM-PAC OT "6 Clicks" Daily Activity     Outcome Measure Help from another person eating meals?: Total Help from another person taking care of personal grooming?: Total Help from another person toileting, which includes using toliet, bedpan, or urinal?: Total Help from another person bathing (including washing, rinsing, drying)?: Total Help from another person to put on and taking off regular upper body clothing?: Total Help from another person to put on and taking off regular lower body clothing?: Total 6 Click Score: 6   End of Session Nurse Communication: Other (comment) (ok to participate in session)  Activity Tolerance: Patient limited by fatigue Patient left: in bed;with call bell/phone within reach;with bed alarm set;with family/visitor present  OT Visit Diagnosis: Unsteadiness on feet (R26.81);Other abnormalities of gait and mobility (R26.89);Muscle weakness (generalized) (M62.81)                Time: 1610-9604 OT Time Calculation (min): 14 min Charges:  OT General Charges $OT Visit: 1 Visit OT Evaluation $OT Eval Low Complexity: 1 Low  Erinne Gillentine OTR/L, MS Acute Rehabilitation Department Office# [phone]   Selinda Flavin 10/09/2022, 12:17 PM

## 2022-10-09 NOTE — Progress Notes (Signed)
Daily Progress Note   Patient Name: Gerald Valdez       Date: 10/09/2022 DOB: 12-04-1928  Age: 89 y.o. MRN#: [id number] Attending Physician: Willeen Niece, MD Primary Care Physician: [name], MD Admit Date: 10/07/2022 Length of Stay: 2 days  Reason for Consultation/Follow-up: Establishing goals of care  Subjective:   CC: Patient sleeping comfortably in the bed so did not awaken during visit.  Following up regarding complex medical decision making.  Subjective:  Reviewed EMR prior to presenting to bedside.  Also discussed care with RN for updates.  Patient has been evaluated by speech who notes aspiration risk as previously known.  Patient also evaluated by PT/OT.  Patient has known functional decline in setting of dementia requiring max assist.  When seeing patient today, he is sleeping comfortably in bed.  Patient's wife also resting in chair at bedside.  Wife has continued to state that she does not want to make further medical decisions because her children are not present there to discuss things with her regarding patient's care.  Wife has reported that patient is back to his baseline.  Patient can be discharged when appropriate from hospitalist's medical perspective.  TOC can be involved to offer wife hospice choice to support care at home.  Objective:   Vital Signs:  BP 109/75 (BP Location: Right Arm)   Pulse (!) 125   Temp (!) 97.5 F (36.4 C) (Axillary)   Resp 17   SpO2 100%   Physical Exam: General: NAD, sleeping comfortably, chronically ill-appearing Eyes: No drainage noted HENT: Dry mucous membranes Cardiovascular: Tachycardia noted Respiratory: Slightly increased work of breathing noted Skin: Multiple ecchymoses on lower extremities bilaterally b/l Neuro: Sleeping  Imaging:  I personally reviewed recent imaging.   Assessment & Plan:   Assessment: Patient is a 89 year old male with a past medical history of mention, CHF, chronic indwelling Foley  catheter, chronic hypoxic and hypercapnic respiratory failure secondary to COPD on supplemental nasal 2-3L cannula at baseline, hypertension, hyperlipidemia, CKD stage III, atrial fibrillation not on anticoagulation due to recent hematuria, chronic hyponatremia, history of C. difficile who was admitted on 10/07/2022 for management of altered mental status with worsening agitation.  EMS had been called and patient was not responsive at 7 PM upon their presentation.  During admission patient has received medical management for urinary tract infection and acute on chronic hypoxic and hypercapnic respiratory failure, x-ray showing concern for edema versus infiltrate.  Palliative medicine team consulted to assist with complex medical decision making. Of note, patient has been seen by palliative medicine team during prior hospitalizations.  Recommendations/Plan: # Complex medical decision making/goals of care:    -Patient unable to participate in complex medical decision making due to his underlying medical status.                -Patient reported to be back at baseline as per wife. Patient already DNR/DNI.  Have already discussed would not recommend use of BiPAP as patient is high aspiration risk.  Defer discharge planning to hospitalist when deemed medically appropriate.  Could consider involving TOC to offer family hospice choices and further information regarding hospice to support care at home.                -  Code Status: Limited: Do not attempt resuscitation (DNR) -DNR-LIMITED -Do Not Intubate/DNI     # Symptom management                -As per primary team   #  Psycho-social/Spiritual Support:  - Support System: wife, son , daughter   # Discharge Planning:  To Be Determined  Thank you for allowing the palliative care team to participate in the care [name].  [name], DO Palliative Care Provider PMT # [phone]  If patient remains symptomatic despite maximum doses, please call  PMT at [phone] between 0700 and 1900. Outside of these hours, please call attending, as PMT does not have night coverage.  *Please note that this is a verbal dictation therefore any spelling or grammatical errors are due to the "[employer]" system interpretation.

## 2022-10-09 NOTE — Evaluation (Signed)
Physical Therapy Evaluation Patient Details Name: Gerald Valdez MRN: [id number] DOB: March 30, 1928 Today's Date: 10/09/2022  History of Present Illness  patient is a 89 year old male who presented to the emergency room on 8/29 with hypoxia, hematuria and change in mental status. Patient was admitted with acute on chronic hypoxic and hypercarbic respiratory failure due to pneumonia, sepsis, agitated delirium. PMHx: bladder cancer on chronic Foley, chronic hypoxic and hypercarbic respiratory failure on 3L O2, COPD, HFpEF, HTN, HLD, chronic anemia, CKD, a-fib on Eliquis  Clinical Impression  Patient restless in bed, wife and son present. Patient did  mobilize to sitting on bed edge briefly. Palliative Medicaine following For GOC, will follow to determine further PT intervention, based on decision by family.        If plan is discharge home, recommend the following: Two people to help with walking and/or transfers;Two people to help with bathing/dressing/bathroom   Can travel by private vehicle        Equipment Recommendations None recommended by PT  Recommendations for Other Services       Functional Status Assessment Patient has had a recent decline in their functional status and/or demonstrates limited ability to make significant improvements in function in a reasonable and predictable amount of time     Precautions / Restrictions Precautions Precautions: Fall Precaution Comments: on O2 Restrictions Weight Bearing Restrictions: No      Mobility  Bed Mobility Overal bed mobility: Needs Assistance Bed Mobility: Supine to Sit, Sit to Supine     Supine to sit: Max assist, +2 for physical assistance, +2 for safety/equipment Sit to supine: +2 for safety/equipment, +2 for physical assistance, Max assist   General bed mobility comments: multimodal cues to move  legs , assisted trunk    Transfers                        Ambulation/Gait                  Stairs             Wheelchair Mobility     Tilt Bed    Modified Rankin (Stroke Patients Only)       Balance Overall balance assessment: Needs assistance Sitting-balance support: Feet supported, Bilateral upper extremity supported Sitting balance-Leahy Scale: Fair Sitting balance - Comments: initally poor, did gain some control to sit near midline briefly                                     Pertinent Vitals/Pain Pain Assessment Breathing: normal Negative Vocalization: none Facial Expression: smiling or inexpressive Body Language: relaxed Consolability: no need to console PAINAD Score: 0    Home Living Family/patient expects to be discharged to:: Private residence Living Arrangements: Spouse/significant other Available Help at Discharge: Family;Available PRN/intermittently Type of Home: House Home Access: Ramped entrance       Home Layout: One level Home Equipment: Rollator (4 wheels);Rolling Walker (2 wheels);Tub bench;BSC/3in1;Hand held shower head;Wheelchair - manual Additional Comments: Pt wears 3L at baseline. Has concentrator at home    Prior Function Prior Level of Function : Needs assist  Cognitive Assist : Mobility (cognitive);ADLs (cognitive) Mobility (Cognitive): Step by step cues ADLs (Cognitive): Step by step cues Physical Assist : Mobility (physical)     Mobility Comments: per wife, able to  guide patient to stand and pivot to [facility] and toilet.nonambulatory lately ADLs  Comments: patient has TD for LB Dressing, with patient able to help some with UB     Extremity/Trunk Assessment        Lower Extremity Assessment Lower Extremity Assessment: Generalized weakness (multiple wounds)    Cervical / Trunk Assessment Cervical / Trunk Assessment: Kyphotic  Communication      Cognition Arousal: Lethargic Behavior During Therapy: Restless Overall Cognitive Status: Impaired/Different from baseline Area of Impairment: Orientation, Attention,  Following commands                   Current Attention Level: Focused   Following Commands: Follows one step commands inconsistently                General Comments      Exercises     Assessment/Plan    PT Assessment Patient needs continued PT services  PT Problem List Decreased strength;Decreased mobility;Decreased activity tolerance;Decreased balance;Decreased knowledge of use of DME;Decreased cognition       PT Treatment Interventions Therapeutic activities;Therapeutic exercise;Patient/family education;Functional mobility training;Balance training    PT Goals (Current goals can be found in the Care Plan section)  Acute Rehab PT Goals Patient Stated Goal: unsure of plan of care PT Goal Formulation: With family Time For Goal Achievement: 10/23/22 Potential to Achieve Goals: Poor    Frequency Min 1X/week     Co-evaluation               AM-PAC PT "6 Clicks" Mobility  Outcome Measure Help needed turning from your back to your side while in a flat bed without using bedrails?: Total Help needed moving from lying on your back to sitting on the side of a flat bed without using bedrails?: Total Help needed moving to and from a bed to a chair (including a wheelchair)?: Total Help needed standing up from a chair using your arms (e.g., wheelchair or bedside chair)?: Total Help needed to walk in hospital room?: Total Help needed climbing 3-5 steps with a railing? : Total 6 Click Score: 6    End of Session   Activity Tolerance: Patient limited by fatigue Patient left: in bed;with bed alarm set;with call bell/phone within reach;with family/visitor present Nurse Communication: Mobility status PT Visit Diagnosis: Unsteadiness on feet (R26.81);Muscle weakness (generalized) (M62.81)    Time: 1610-9604 PT Time Calculation (min) (ACUTE ONLY): 14 min   Charges:   PT Evaluation $PT Eval Low Complexity: 1 Low   PT General Charges $$ ACUTE PT VISIT: 1  Visit         Blanchard Kelch PT Acute Rehabilitation Services Office [phone] Weekend [phone]   Rada Hay 10/09/2022, 11:28 AM

## 2022-10-09 NOTE — Progress Notes (Signed)
PROGRESS NOTE    Gerald Valdez  NGE:[id number] DOB: 1928-05-12 DOA: 10/07/2022 PCP: [name], MD   Brief Narrative:  This 89 years old Male with PMH significant for combined CHF with LVEF 40 to 45%, Chronic indwelling Foley catheter, Chronic hypoxic and hypercapnic respiratory failure due to COPD, on 2 to 3 L of supplemental oxygen at baseline, hypertension, hyperlipidemia, CKD stage IIIa, atrial fibrillation not on anticoagulation due to recurrent hematuria, Chronic hyponatremia, history of C. difficile in the past, dementia presented in the ED with altered mentation, patient has been more agitated and Patient was found not responsive at 7 PM.  EMS was called.  Patient is found to have UTI,  requiring IV antibiotics, also was hypoxic and hypercapnic requiring BiPAP.  Patient is admitted for further evaluation.  Assessment & Plan:   Principal Problem:   Acute respiratory failure (HCC) Active Problems:   Complicated UTI (urinary tract infection)   Acute metabolic encephalopathy   Chronic systolic CHF preserved EF 40 to 45% (congestive heart failure) (HCC)   Hyponatremia   COPD (chronic obstructive pulmonary disease) (HCC)   Malignant neoplasm of overlapping sites of bladder (HCC)   AKI (acute kidney injury) (HCC)   Protein-calorie malnutrition, moderate (HCC)   Chronic anemia   Acute on chronic respiratory failure with hypoxia and hypercapnia (HCC)   CAP (community acquired pneumonia)   Palliative care encounter   Need for emotional support   Counseling and coordination of care   Acute on chronic systolic congestive heart failure (HCC)  Acute metabolic encephalopathy: Acute on chronic hypoxic and hypercapnic respiratory failure: This has been a recurrent issue in the setting of hypercapnia/ hypoxia due to COPD. Patient was hypoxic and hypercapnic on arrival, required BiPAP. Continue BiPAP as needed and at night. CT head unremarkable. Chest x-ray showed  infiltrate. Continue supplemental oxygen and wean as tolerated. Discussed with family, would not tolerate MRI given confusion and significant dementia.   Family wants to hold off on MRI at this time. Family reports patient is back to his baseline mental status. Pulmonology consult appreciated recommended to continue current plan.  Complicated UTI: ID has felt that this was more colonization.  UA shows no evidence of bacteria. Continue current antibiotics.  Chronic combined systolic and diastolic CHF: Patient was initially given a dose of Lasix, his blood pressure went down.   He does not appear to be fluid overloaded at this time. There is no peripheral edema noted. Continue to monitor clinical status.  Hold off on further Lasix.  Hyponatremia: Chronic recurrent issue, Patient has responded in the past with gentle hydration. Patient appears clinically dry with AKI.   Continue IV hydration and monitor serum creatinine Serum sodium improving 117> 122> 124> 125> 128  COPD:  No wheezing or history of cough. Chest x-ray did show possibility of infiltrate. Continue home inhalers and DuoNeb as needed.  Community-acquired pneumonia: Chest x-ray did show possibility of infiltrate. Continue IV Rocephin and Zithromax. Obtain strep pneumo and Legionella urinary antigen.  Acute kidney injury: > Improving Baseline serum creatinine 1.2, presented with creatinine 1.47. Continue IV hydration.  Monitor serum creatinine Serum creatinine improving 1.47> 1.40>1.32  Protein calorie malnutrition: Obtain nutritional consult.  Goals of care discussion: Palliative care consulted to discuss goals of care.  DVT prophylaxis: SCDs Code Status: DNR Family Communication: Wife at bed side. Disposition Plan:   Status is: Inpatient Remains inpatient appropriate because: Patient admitted for acute on chronic hypoxic and hypercapnic respiratory failure due to COPD and  pneumonia, now requiring  antibiotics and BIPAP.  Consultants:  Pulmonology Palliative care  Procedures: BIPAP  Antimicrobials:  Anti-infectives (From admission, onward)    Start     Dose/Rate Route Frequency Ordered Stop   10/08/22 0515  cefTRIAXone (ROCEPHIN) 1 g in sodium chloride 0.9 % 100 mL IVPB  Status:  Discontinued        1 g 200 mL/hr over 30 Minutes Intravenous Every 24 hours 10/08/22 0415 10/08/22 0421   10/08/22 0200  cefTRIAXone (ROCEPHIN) 2 g in sodium chloride 0.9 % 100 mL IVPB        2 g 200 mL/hr over 30 Minutes Intravenous Every 24 hours 10/08/22 0056 10/13/22 0159   10/08/22 0200  azithromycin (ZITHROMAX) 500 mg in sodium chloride 0.9 % 250 mL IVPB        500 mg 250 mL/hr over 60 Minutes Intravenous Every 24 hours 10/08/22 0056 10/13/22 0159      Subjective: Patient was seen and examined at bedside. Overnight events noted.   Patient appears comfortable, but has intermittent episodes of agitation, yelling. Wife at bedside states he is at his baseline.He is weaned down to 4 L of supplemental oxygen via nasal cannula.  Objective: Vitals:   10/09/22 0745 10/09/22 0800 10/09/22 0841 10/09/22 0900  BP:  109/75  (!) 93/54  Pulse:  (!) 125  90  Resp:  17  14  Temp: (!) 97.5 F (36.4 C)     TempSrc: Axillary     SpO2:  91% 100% 100%    Intake/Output Summary (Last 24 hours) at 10/09/2022 1132 Last data filed at 10/09/2022 0800 Gross per 24 hour  Intake 1988.81 ml  Output 325 ml  Net 1663.81 ml   There were no vitals filed for this visit.  Examination:  General exam: Appears comfortable, calm, deconditioned, not in any acute distress. Respiratory system: Decreased breath sounds. Respiratory effort normal.  No accessory muscle use, RR 14 Cardiovascular system: S1 & S2 heard, RRR. No JVD, murmurs, rubs, gallops or clicks. No pedal edema. Gastrointestinal system: Abdomen is non distended, soft and non tender. Normal bowel sounds heard. Central nervous system: Confused, restless. No  focal neurological deficits. Extremities: No edema, no cyanosis, no clubbing Skin: No rashes, lesions or ulcers Psychiatry: Mood & affect appropriate.     Data Reviewed: I have personally reviewed following labs and imaging studies  CBC: Recent Labs  Lab 10/07/22 2118 10/07/22 2132 10/08/22 0725  WBC 11.3*  --  7.6  NEUTROABS 8.9*  --   --   HGB 10.4* 10.9* 8.6*  HCT 33.5* 32.0* 27.1*  MCV 92.5  --  92.2  PLT 273  --  209   Basic Metabolic Panel: Recent Labs  Lab 10/07/22 2309 10/08/22 0121 10/08/22 0725 10/08/22 1421 10/08/22 2008 10/09/22 0850  NA  --  122* 124* 125* 122* 128*  K  --  3.8 3.6 4.0 3.8 3.4*  CL  --  73* 75* 79* 77* 84*  CO2  --  36* 37* 36* 35* 35*  GLUCOSE  --  175* 156* 108* 123* 123*  BUN  --  22 23 22 22 21   CREATININE  --  1.55* 1.47* 1.35* 1.40* 1.32*  CALCIUM  --  8.5* 8.1* 8.2* 8.1* 8.4*  MG 2.2  --   --   --   --   --   PHOS 4.1  --   --   --   --   --    GFR: CrCl  cannot be calculated (Unknown ideal weight.). Liver Function Tests: Recent Labs  Lab 10/07/22 2118  AST 48*  ALT 30  ALKPHOS 81  BILITOT 0.6  PROT 7.1  ALBUMIN 4.0   No results for input(s): "LIPASE", "AMYLASE" in the last 168 hours. No results for input(s): "AMMONIA" in the last 168 hours. Coagulation Profile: No results for input(s): "INR", "PROTIME" in the last 168 hours. Cardiac Enzymes: Recent Labs  Lab 10/07/22 2309  CKTOTAL 237   BNP (last 3 results) No results for input(s): "PROBNP" in the last 8760 hours. HbA1C: No results for input(s): "HGBA1C" in the last 72 hours. CBG: No results for input(s): "GLUCAP" in the last 168 hours. Lipid Profile: No results for input(s): "CHOL", "HDL", "LDLCALC", "TRIG", "CHOLHDL", "LDLDIRECT" in the last 72 hours. Thyroid Function Tests: Recent Labs    10/08/22 0121  TSH 2.992   Anemia Panel: Recent Labs    10/08/22 0121  VITAMINB12 930*  FOLATE 30.3  FERRITIN 139  TIBC 351  IRON 23*  RETICCTPCT 2.3    Sepsis Labs: Recent Labs  Lab 10/07/22 2309  PROCALCITON <0.10    Recent Results (from the past 240 hour(s))  SARS Coronavirus 2 by RT PCR (hospital order, performed in [facility] hospital lab) *cepheid single result test* Anterior Nasal Swab     Status: None   Collection Time: 10/07/22  9:31 PM   Specimen: Anterior Nasal Swab  Result Value Ref Range Status   SARS Coronavirus 2 by RT PCR NEGATIVE NEGATIVE Final    Comment: (NOTE) SARS-CoV-2 target nucleic acids are NOT DETECTED.  The SARS-CoV-2 RNA is generally detectable in upper and lower respiratory specimens during the acute phase of infection. The lowest concentration of SARS-CoV-2 viral copies this assay can detect is 250 copies / mL. A negative result does not preclude SARS-CoV-2 infection and should not be used as the sole basis for treatment or other patient management decisions.  A negative result may occur with improper specimen collection / handling, submission of specimen other than nasopharyngeal swab, presence of viral mutation(s) within the areas targeted by this assay, and inadequate number of viral copies (<250 copies / mL). A negative result must be combined with clinical observations, patient history, and epidemiological information.  Fact Sheet for Patients:   RoadLapTop.co.za  Fact Sheet for Healthcare Providers: http://kim-miller.com/  This test is not yet approved or  cleared by the [place] FDA and has been authorized for detection and/or diagnosis of SARS-CoV-2 by FDA under an Emergency Use Authorization (EUA).  This EUA will remain in effect (meaning this test can be used) for the duration of the COVID-19 declaration under Section 564(b)(1) of the Act, 21 U.S.C. section 360bbb-3(b)(1), unless the authorization is terminated or revoked sooner.  Performed at [facility], [address] [place], [place] [address]   MRSA Next  Gen by PCR, Nasal     Status: None   Collection Time: 10/08/22  1:16 AM   Specimen: Nasal Mucosa; Nasal Swab  Result Value Ref Range Status   MRSA by PCR Next Gen NOT DETECTED NOT DETECTED Final    Comment: (NOTE) The GeneXpert MRSA Assay (FDA approved for NASAL specimens only), is one component of a comprehensive MRSA colonization surveillance program. It is not intended to diagnose MRSA infection nor to guide or monitor treatment for MRSA infections. Test performance is not FDA approved in patients less than 87 years old. Performed at [facility], [address] [place], [place]  [address]    Radiology Studies: CT Head Wo Contrast  Result Date: 10/07/2022 CLINICAL DATA:  Altered mental status.  History of dementia. EXAM: CT HEAD WITHOUT CONTRAST TECHNIQUE: Contiguous axial images were obtained from the base of the skull through the vertex without intravenous contrast. RADIATION DOSE REDUCTION: This exam was performed according to the departmental dose-optimization program which includes automated exposure control, adjustment of the mA and/or kV according to patient size and/or use of iterative reconstruction technique. COMPARISON:  Head CT dated 08/01/2022. FINDINGS: Evaluation of this exam is limited due to respiratory motion artifact. Brain: Moderate age-related atrophy and chronic microvascular ischemic changes. There is no acute intracranial hemorrhage. No mass effect or midline shift. No extra-axial fluid collection. Vascular: No hyperdense vessel or unexpected calcification. Skull: Normal. Negative for fracture or focal lesion. Sinuses/Orbits: The visualized paranasal sinuses and the mastoid air cells are clear. Other: None IMPRESSION: 1. No acute intracranial pathology. 2. Moderate age-related atrophy and chronic microvascular ischemic changes. Electronically Signed   By: Elgie Collard M.D.   On: 10/07/2022 21:53   DG Chest Portable 1 View  Result Date:  10/07/2022 CLINICAL DATA:  Shortness of breath.  Altered mental status. EXAM: PORTABLE CHEST 1 VIEW COMPARISON:  09/10/2022 FINDINGS: Heart size and pulmonary vascularity are normal for technique. Probable emphysematous changes in the lungs. Slight interstitial pattern to the lung bases may represent mild edema or pneumonia. No pleural effusions. No pneumothorax. Mediastinal contours appear intact. Calcification of the aorta. IMPRESSION: 1. Emphysematous changes in the lungs. 2. Slight interstitial pattern to the lung bases appears progressed since prior study, possibly indicating early edema or pneumonia. Electronically Signed   By: Burman Nieves M.D.   On: 10/07/2022 21:20    Scheduled Meds:  Chlorhexidine Gluconate Cloth  6 each Topical Daily   pantoprazole (PROTONIX) IV  40 mg Intravenous QHS   potassium chloride  40 mEq Oral Once   rosuvastatin  20 mg Oral QPM   tamsulosin  0.4 mg Oral QPC supper   Continuous Infusions:  azithromycin Stopped (10/09/22 0403)   cefTRIAXone (ROCEPHIN)  IV Stopped (10/09/22 0334)   dextrose 5 % and 0.9 % NaCl 75 mL/hr at 10/09/22 0922     LOS: 2 days    Time spent: 35 mins    Willeen Niece, MD [facility]   If 7PM-7AM, please contact night-coverage

## 2022-10-09 NOTE — Plan of Care (Signed)
  Problem: Education: Goal: Knowledge of the prescribed therapeutic regimen will improve 10/09/2022 0701 by Sherri Rad, RN Outcome: Progressing 10/09/2022 0701 by Sherri Rad, RN Outcome: Progressing

## 2022-10-10 DIAGNOSIS — J9602 Acute respiratory failure with hypercapnia: Secondary | ICD-10-CM | POA: Diagnosis not present

## 2022-10-10 DIAGNOSIS — J9601 Acute respiratory failure with hypoxia: Secondary | ICD-10-CM | POA: Diagnosis not present

## 2022-10-10 DIAGNOSIS — N390 Urinary tract infection, site not specified: Secondary | ICD-10-CM | POA: Diagnosis not present

## 2022-10-10 DIAGNOSIS — R0603 Acute respiratory distress: Secondary | ICD-10-CM

## 2022-10-10 DIAGNOSIS — Z515 Encounter for palliative care: Secondary | ICD-10-CM | POA: Diagnosis not present

## 2022-10-10 DIAGNOSIS — F03C18 Unspecified dementia, severe, with other behavioral disturbance: Secondary | ICD-10-CM

## 2022-10-10 DIAGNOSIS — E871 Hypo-osmolality and hyponatremia: Secondary | ICD-10-CM | POA: Diagnosis not present

## 2022-10-10 LAB — CBC
HCT: 28.7 % — ABNORMAL LOW (ref 39.0–52.0)
Hemoglobin: 8.9 g/dL — ABNORMAL LOW (ref 13.0–17.0)
MCH: 29.9 pg (ref 26.0–34.0)
MCHC: 31 g/dL (ref 30.0–36.0)
MCV: 96.3 fL (ref 80.0–100.0)
Platelets: 222 10*3/uL (ref 150–400)
RBC: 2.98 MIL/uL — ABNORMAL LOW (ref 4.22–5.81)
RDW: 13.7 % (ref 11.5–15.5)
WBC: 11 10*3/uL — ABNORMAL HIGH (ref 4.0–10.5)
nRBC: 0.3 % — ABNORMAL HIGH (ref 0.0–0.2)

## 2022-10-10 LAB — BASIC METABOLIC PANEL
Anion gap: 8 (ref 5–15)
BUN: 16 mg/dL (ref 8–23)
CO2: 32 mmol/L (ref 22–32)
Calcium: 8.4 mg/dL — ABNORMAL LOW (ref 8.9–10.3)
Chloride: 92 mmol/L — ABNORMAL LOW (ref 98–111)
Creatinine, Ser: 1.14 mg/dL (ref 0.61–1.24)
GFR, Estimated: >60 mL/min (ref >60)
Glucose, Bld: 127 mg/dL — ABNORMAL HIGH (ref 70–99)
Potassium: 3.6 mmol/L (ref 3.5–5.1)
Sodium: 132 mmol/L — ABNORMAL LOW (ref 135–145)

## 2022-10-10 LAB — MAGNESIUM: Magnesium: 2.1 mg/dL (ref 1.7–2.4)

## 2022-10-10 LAB — PHOSPHORUS: Phosphorus: 2.4 mg/dL — ABNORMAL LOW (ref 2.5–4.6)

## 2022-10-10 MED ORDER — METOPROLOL TARTRATE 5 MG/5ML IV SOLN
2.5000 mg | Freq: Once | INTRAVENOUS | Status: AC | PRN
  Administered 2022-10-10: 2.5 mg via INTRAVENOUS
  Filled 2022-10-10: qty 5

## 2022-10-10 NOTE — TOC Initial Note (Addendum)
Transition of Care [facility]) - Initial/Assessment Note    Patient Details  Name: Gerald Valdez MRN: [id number] Date of Birth: [birth year]  Transition of Care [facility]) CM/SW Contact:    Adrian Prows, RN Phone Number: 10/10/2022, 10:45 AM  Clinical Narrative:                 [facility] consult for d/c planning; pt disoriented x 4; spoke w/ pt's wife Ezikiel Outerbridge [phone]); she plans for pt to return home; she also says she will talk to family regarding him returning home w/ hospice; pt has glasses and dentures (upper/partial lower); pt has walker, rolator, and wheelchair; his home oxygen is w/ [employer]; Mrs Seeliger says pt has [facility] services but she is does not know the agency's name; TOC will follow.  Expected Discharge Plan: Home w Hospice Care Barriers to Discharge: Continued Medical Work up   Patient Goals and CMS Choice Patient states their goals for this hospitalization and ongoing recovery are:: pt's wife plans for him to return home possibly w/ hospice          Expected Discharge Plan and Services   Discharge Planning Services: CM Consult Post Acute Care Choice: Resumption of Svcs/PTA Provider (home oxygen from [facility]; [facility] services (wife does not know name of agency)) Living arrangements for the past 2 months: Single Family Home                                      Prior Living Arrangements/Services Living arrangements for the past 2 months: Single Family Home Lives with:: Spouse Patient language and need for interpreter reviewed:: Yes Do you feel safe going back to the place where you live?: Yes      Need for Family Participation in Patient Care: Yes (Comment) Care giver support system in place?: Yes (comment) Current home services: DME, Home PT (walker, rolator, wheelchair,home oxygen, HHPT) Criminal Activity/Legal Involvement Pertinent to Current Situation/Hospitalization: No - Comment as needed  Activities of Daily Living      Permission  Sought/Granted Permission sought to share information with : Case Manager Permission granted to share information with : Yes, Verbal Permission Granted        Permission granted to share info w Relationship: Tae Librizzi (spouse) [phone]     Emotional Assessment Appearance:: Appears stated age Attitude/Demeanor/Rapport: Unable to Assess Affect (typically observed): Unable to Assess Orientation: :  (unable to assess) Alcohol / Substance Use: Not Applicable Psych Involvement: No (comment)  Admission diagnosis:  Acute respiratory failure (HCC) [J96.00] Hyponatremia [E87.1] Respiratory distress [R06.03] Acute on chronic systolic congestive heart failure (HCC) [I50.23] AKI (acute kidney injury) (HCC) [N17.9] Patient Active Problem List   Diagnosis Date Noted   Dementia (HCC) 10/09/2022   CAP (community acquired pneumonia) 10/08/2022   Palliative care encounter 10/08/2022   Need for emotional support 10/08/2022   Counseling and coordination of care 10/08/2022   Acute on chronic systolic congestive heart failure (HCC) 10/08/2022   Acute respiratory failure (HCC) 10/07/2022   Atrial fibrillation, chronic (HCC) 09/10/2022   COPD (chronic obstructive pulmonary disease) with chronic bronchitis 09/10/2022   Hyponatremia 08/11/2022   Hyperkalemia 08/11/2022   COPD with acute exacerbation (HCC) 08/11/2022   Acute respiratory failure with hypercapnia (HCC) 08/11/2022   Acute metabolic encephalopathy 08/02/2022   Seizure like activity (HCC) 08/02/2022   Acute cystitis 08/02/2022   History of bladder cancer on chronic Foley 08/02/2022  Chronic systolic CHF preserved EF 40 to 45% (congestive heart failure) (HCC) 08/02/2022   DNR (do not resuscitate) 08/02/2022   H/O atrial flutter 08/02/2022   Atrial flutter with rapid ventricular response (HCC) 07/21/2022   Acute heart failure (HCC) 07/21/2022   Acute hypoxic on chronic hypercapnic respiratory failure (HCC) 07/20/2022   Acute on  chronic respiratory failure with hypoxia and hypercapnia (HCC) 07/19/2022   Malignant neoplasm of bladder wall (HCC) 05/27/2022   Hypokalemia 07/04/2021   Thrombocytopenia (HCC) 07/04/2021   Iron deficiency anemia 07/04/2021   Protein-calorie malnutrition, severe 07/03/2021   Dehydration 07/01/2021   AKI (acute kidney injury) (HCC) 07/01/2021   CKD stage 3a, GFR 45-59 ml/min (HCC) 07/01/2021   Protein-calorie malnutrition, moderate (HCC) 07/01/2021   Diarrhea 07/01/2021   Complicated UTI (urinary tract infection) 07/01/2021   Sepsis (HCC) 07/01/2021   Chronic hypoxic respiratory failure (HCC) 07/01/2021   Hyperglycemia 07/01/2021   Chronic anemia 07/01/2021   Urinary retention 06/26/2021   Cancer of overlapping sites of bladder (HCC) 06/25/2021   Malignant neoplasm of overlapping sites of bladder (HCC) 11/20/2020   COPD (chronic obstructive pulmonary disease) (HCC) 02/10/2020   Aortic atherosclerosis (HCC) 02/10/2020   Dyspnea on exertion 02/10/2020   Essential hypertension 02/10/2020   Hyperlipidemia 02/10/2020   GERD (gastroesophageal reflux disease) 02/10/2020   GI bleed 01/30/2014   Acute post-hemorrhagic anemia 01/30/2014   Acute duodenal ulcer with bleeding 01/30/2014   PCP:  [name], MD Pharmacy:   [facility] - [place], [place] - [address] [place] [address] Phone: [phone] Fax: [phone]     Social Determinants of Health (SDOH) Social History: SDOH Screenings   Food Insecurity: No Food Insecurity (10/10/2022)  Housing: Low Risk  (10/10/2022)  Transportation Needs: No Transportation Needs (10/10/2022)  Utilities: Not At Risk (10/10/2022)  Tobacco Use: Medium Risk (09/10/2022)   SDOH Interventions: Food Insecurity Interventions: Intervention Not Indicated, Inpatient TOC Housing Interventions: Intervention Not Indicated, Inpatient TOC Transportation Interventions: Intervention Not  Indicated, Inpatient TOC Utilities Interventions: Intervention Not Indicated, Inpatient TOC   Readmission Risk Interventions    09/13/2022   12:21 PM 08/11/2022   12:01 PM 07/27/2022    4:19 PM  Readmission Risk Prevention Plan  Transportation Screening Complete Complete Complete  PCP or Specialist Appt within 3-5 Days  Complete Complete  HRI or Home Care Consult  Complete   Social Work Consult for Recovery Care Planning/Counseling  Complete Complete  Palliative Care Screening  Complete Complete  Medication Review [employer]) Complete Referral to Pharmacy Complete  PCP or Specialist appointment within 3-5 days of discharge Complete    HRI or Home Care Consult Complete    SW Recovery Care/Counseling Consult Complete    Palliative Care Screening Not Applicable    Skilled Nursing Facility Not Applicable

## 2022-10-10 NOTE — Progress Notes (Signed)
PROGRESS NOTE    ELENO VADEN  WUJ:[id number] DOB: 1928/07/14 DOA: 10/07/2022 PCP: [name], MD   Brief Narrative:  This 89 years old Male with PMH significant for combined CHF with LVEF 40 to 45%, Chronic indwelling Foley catheter, Chronic hypoxic and hypercapnic respiratory failure due to COPD, on 2 to 3 L of supplemental oxygen at baseline, hypertension, hyperlipidemia, CKD stage IIIa, atrial fibrillation not on anticoagulation due to recurrent hematuria, Chronic hyponatremia, history of C. difficile in the past, dementia presented in the ED with altered mentation, patient has been more agitated and Patient was found not responsive at 7 PM.  EMS was called.  Patient is found to have UTI,  requiring IV antibiotics, also was hypoxic and hypercapnic requiring BiPAP.  Patient is admitted for further evaluation.  Assessment & Plan:   Principal Problem:   Acute respiratory failure (HCC) Active Problems:   Complicated UTI (urinary tract infection)   Acute metabolic encephalopathy   Chronic systolic CHF preserved EF 40 to 45% (congestive heart failure) (HCC)   Hyponatremia   COPD (chronic obstructive pulmonary disease) (HCC)   Malignant neoplasm of overlapping sites of bladder (HCC)   AKI (acute kidney injury) (HCC)   Protein-calorie malnutrition, moderate (HCC)   Chronic anemia   Acute on chronic respiratory failure with hypoxia and hypercapnia (HCC)   CAP (community acquired pneumonia)   Palliative care encounter   Need for emotional support   Counseling and coordination of care   Acute on chronic systolic congestive heart failure (HCC)   Dementia (HCC)  Acute metabolic encephalopathy: Acute on chronic hypoxic and hypercapnic respiratory failure: This has been a recurrent issue in the setting of hypercapnia/ hypoxia due to COPD. Patient was hypoxic and hypercapnic on arrival, required BiPAP. Continue BiPAP as needed and at night. CT head unremarkable. Chest x-ray showed  infiltrate. Continue supplemental oxygen and wean as tolerated. Discussed with family, would not tolerate MRI given confusion and significant dementia.   Family wants to hold off on MRI at this time. Family reports patient is back to his baseline mental status. Pulmonology consult appreciated recommended to continue current plan. He is weaned down to 6l/min of supplemental oxygen.  Complicated UTI: ID has felt that this was more colonization.  UA shows no evidence of bacteria. Continue current antibiotics.  Chronic combined systolic and diastolic CHF: Patient was initially given a dose of Lasix, his blood pressure went down.   He does not appear to be fluid overloaded at this time. There is no peripheral edema noted. Continue to monitor clinical status.  Hold off on further Lasix.  Hyponatremia: Chronic recurrent issue, Patient has responded in the past with gentle hydration. Patient appears clinically dry with AKI.   Continue IV hydration and monitor serum creatinine Serum sodium improving 117> 122> 124> 125> 128 >132  COPD:  No wheezing or history of cough. Chest x-ray did show possibility of infiltrate. Continue home inhalers and DuoNeb as needed.  Community-acquired pneumonia: Chest x-ray did show possibility of infiltrate. Continue IV Rocephin and Zithromax. Obtain strep pneumo and Legionella urinary antigen.  Acute kidney injury: > Resolved. Baseline serum creatinine 1.2, presented with creatinine 1.47. Continue IV hydration.  Monitor serum creatinine Serum creatinine improving 1.47> 1.40>1.32  Protein calorie malnutrition: Obtain nutritional consult.  Goals of care discussion: Palliative care consulted to discuss goals of care. Patient's wife wants patient to go home with home hospice.  DVT prophylaxis: SCDs Code Status: DNR Family Communication: Wife at bed side. Disposition Plan:  Status is: Inpatient Remains inpatient appropriate because: Patient  admitted for acute on chronic hypoxic and hypercapnic respiratory failure due to COPD and pneumonia, now requiring antibiotics and BIPAP.  Consultants:  Pulmonology Palliative care  Procedures: BIPAP  Antimicrobials:  Anti-infectives (From admission, onward)    Start     Dose/Rate Route Frequency Ordered Stop   10/08/22 0515  cefTRIAXone (ROCEPHIN) 1 g in sodium chloride 0.9 % 100 mL IVPB  Status:  Discontinued        1 g 200 mL/hr over 30 Minutes Intravenous Every 24 hours 10/08/22 0415 10/08/22 0421   10/08/22 0200  cefTRIAXone (ROCEPHIN) 2 g in sodium chloride 0.9 % 100 mL IVPB        2 g 200 mL/hr over 30 Minutes Intravenous Every 24 hours 10/08/22 0056 10/13/22 0159   10/08/22 0200  azithromycin (ZITHROMAX) 500 mg in sodium chloride 0.9 % 250 mL IVPB        500 mg 250 mL/hr over 60 Minutes Intravenous Every 24 hours 10/08/22 0056 10/13/22 0159      Subjective: Patient was seen and examined at bedside. Overnight events noted.   Patient appears comfortable but has intermittent episodes of agitation. He is weaned down to 6 L of supplemental oxygen via nasal cannula. He appears at his baseline mental status.  Objective: Vitals:   10/10/22 0700 10/10/22 0800 10/10/22 0815 10/10/22 0900  BP: (!) 154/117 128/80  (!) 154/88  Pulse: (!) 108 (!) 102  (!) 102  Resp: (!) 27 (!) 23  (!) 27  Temp:   (!) 97.3 F (36.3 C)   TempSrc:   Axillary   SpO2: 91% 99%  93%    Intake/Output Summary (Last 24 hours) at 10/10/2022 1105 Last data filed at 10/10/2022 0800 Gross per 24 hour  Intake 2028.18 ml  Output 1025 ml  Net 1003.18 ml   There were no vitals filed for this visit.  Examination:  General exam: Appears comfortable, calm, deconditioned, not in any distress. Respiratory system: Decreased breath sounds. Respiratory effort normal.  No accessory muscle use, RR 14 Cardiovascular system: S1 & S2 heard, RRR. No JVD, murmurs, rubs, gallops or clicks. No pedal  edema. Gastrointestinal system: Abdomen is non distended, soft and non tender. Normal bowel sounds heard. Central nervous system: Confused, restless. No focal neurological deficits. Extremities: No edema, no cyanosis, no clubbing Skin: No rashes, lesions or ulcers Psychiatry: Mood & affect appropriate.     Data Reviewed: I have personally reviewed following labs and imaging studies  CBC: Recent Labs  Lab 10/07/22 2118 10/07/22 2132 10/08/22 0725 10/10/22 0328  WBC 11.3*  --  7.6 11.0*  NEUTROABS 8.9*  --   --   --   HGB 10.4* 10.9* 8.6* 8.9*  HCT 33.5* 32.0* 27.1* 28.7*  MCV 92.5  --  92.2 96.3  PLT 273  --  209 222   Basic Metabolic Panel: Recent Labs  Lab 10/07/22 2309 10/08/22 0121 10/08/22 0725 10/08/22 1421 10/08/22 2008 10/09/22 0850 10/10/22 0328  NA  --    < > 124* 125* 122* 128* 132*  K  --    < > 3.6 4.0 3.8 3.4* 3.6  CL  --    < > 75* 79* 77* 84* 92*  CO2  --    < > 37* 36* 35* 35* 32  GLUCOSE  --    < > 156* 108* 123* 123* 127*  BUN  --    < > 23 22 22  21  16  CREATININE  --    < > 1.47* 1.35* 1.40* 1.32* 1.14  CALCIUM  --    < > 8.1* 8.2* 8.1* 8.4* 8.4*  MG 2.2  --   --   --   --   --  2.1  PHOS 4.1  --   --   --   --   --  2.4*   < > = values in this interval not displayed.   GFR: CrCl cannot be calculated (Unknown ideal weight.). Liver Function Tests: Recent Labs  Lab 10/07/22 2118  AST 48*  ALT 30  ALKPHOS 81  BILITOT 0.6  PROT 7.1  ALBUMIN 4.0   No results for input(s): "LIPASE", "AMYLASE" in the last 168 hours. No results for input(s): "AMMONIA" in the last 168 hours. Coagulation Profile: No results for input(s): "INR", "PROTIME" in the last 168 hours. Cardiac Enzymes: Recent Labs  Lab 10/07/22 2309  CKTOTAL 237   BNP (last 3 results) No results for input(s): "PROBNP" in the last 8760 hours. HbA1C: No results for input(s): "HGBA1C" in the last 72 hours. CBG: No results for input(s): "GLUCAP" in the last 168 hours. Lipid  Profile: No results for input(s): "CHOL", "HDL", "LDLCALC", "TRIG", "CHOLHDL", "LDLDIRECT" in the last 72 hours. Thyroid Function Tests: Recent Labs    10/08/22 0121  TSH 2.992   Anemia Panel: Recent Labs    10/08/22 0121  VITAMINB12 930*  FOLATE 30.3  FERRITIN 139  TIBC 351  IRON 23*  RETICCTPCT 2.3   Sepsis Labs: Recent Labs  Lab 10/07/22 2309  PROCALCITON <0.10    Recent Results (from the past 240 hour(s))  SARS Coronavirus 2 by RT PCR (hospital order, performed in [facility] hospital lab) *cepheid single result test* Anterior Nasal Swab     Status: None   Collection Time: 10/07/22  9:31 PM   Specimen: Anterior Nasal Swab  Result Value Ref Range Status   SARS Coronavirus 2 by RT PCR NEGATIVE NEGATIVE Final    Comment: (NOTE) SARS-CoV-2 target nucleic acids are NOT DETECTED.  The SARS-CoV-2 RNA is generally detectable in upper and lower respiratory specimens during the acute phase of infection. The lowest concentration of SARS-CoV-2 viral copies this assay can detect is 250 copies / mL. A negative result does not preclude SARS-CoV-2 infection and should not be used as the sole basis for treatment or other patient management decisions.  A negative result may occur with improper specimen collection / handling, submission of specimen other than nasopharyngeal swab, presence of viral mutation(s) within the areas targeted by this assay, and inadequate number of viral copies (<250 copies / mL). A negative result must be combined with clinical observations, patient history, and epidemiological information.  Fact Sheet for Patients:   RoadLapTop.co.za  Fact Sheet for Healthcare Providers: http://kim-miller.com/  This test is not yet approved or  cleared by the [place] FDA and has been authorized for detection and/or diagnosis of SARS-CoV-2 by FDA under an Emergency Use Authorization (EUA).  This EUA will remain in  effect (meaning this test can be used) for the duration of the COVID-19 declaration under Section 564(b)(1) of the Act, 21 U.S.C. section 360bbb-3(b)(1), unless the authorization is terminated or revoked sooner.  Performed at [facility], [address] [place], [place] [address]   Urine Culture     Status: Abnormal   Collection Time: 10/07/22  9:49 PM   Specimen: Urine, Random  Result Value Ref Range Status   Specimen  Description   Final    URINE, RANDOM Performed at [facility] [place], [address], [place], [place] [address]    Special Requests   Final    NONE Reflexed from [id number] Performed at [facility], [address], [place], [place] [address]    Culture MULTIPLE SPECIES PRESENT, SUGGEST RECOLLECTION (A)  Final   Report Status 10/09/2022 FINAL  Final  MRSA Next Gen by PCR, Nasal     Status: None   Collection Time: 10/08/22  1:16 AM   Specimen: Nasal Mucosa; Nasal Swab  Result Value Ref Range Status   MRSA by PCR Next Gen NOT DETECTED NOT DETECTED Final    Comment: (NOTE) The GeneXpert MRSA Assay (FDA approved for NASAL specimens only), is one component of a comprehensive MRSA colonization surveillance program. It is not intended to diagnose MRSA infection nor to guide or monitor treatment for MRSA infections. Test performance is not FDA approved in patients less than 59 years old. Performed at [facility], [address] [place], [place] [address]    Radiology Studies: No results found.  Scheduled Meds:  Chlorhexidine Gluconate Cloth  6 each Topical Daily   pantoprazole (PROTONIX) IV  40 mg Intravenous QHS   rosuvastatin  20 mg Oral QPM   tamsulosin  0.4 mg Oral QPC supper   Continuous Infusions:  azithromycin Stopped (10/10/22 0220)   cefTRIAXone (ROCEPHIN)  IV Stopped (10/10/22 0145)   dextrose 5 % and 0.9 % NaCl Stopped (10/10/22 0757)     LOS: 3 days    Time spent: 35  mins    Willeen Niece, MD [facility]   If 7PM-7AM, please contact night-coverage

## 2022-10-10 NOTE — Progress Notes (Signed)
   10/10/22 2345  BiPAP/CPAP/SIPAP  Reason BIPAP/CPAP not in use Other(comment) (no need of bipap at this time. No resp distress noted)  BiPAP/CPAP /SiPAP Vitals  Pulse Rate (!) 105  Resp 19  SpO2 100 %  MEWS Score/Color  MEWS Score 3  MEWS Score Color Yellow

## 2022-10-10 NOTE — Progress Notes (Signed)
Daily Progress Note   Patient Name: Gerald Valdez       Date: 10/10/2022 DOB: 13-Oct-1928  Age: 89 y.o. MRN#: [id number] Attending Physician: Willeen Niece, MD Primary Care Physician: [name], MD Admit Date: 10/07/2022 Length of Stay: 3 days  Reason for Consultation/Follow-up: Establishing goals of care  Subjective:   CC: Patient sleeping comfortably in the bed.  Following up regarding complex medical decision making.  Subjective:  Reviewed EMR prior to presenting to bedside.   Patient laying comfortably in the bed resting.   TOC assisting with coordination of discharge planning.  Wife initially stating wanting to go home with hospice though now again wants to discuss his care with family.  Wife has continued to state that she wants to discuss all care plans with her family including son and daughter before making decisions.  Do believe patient is appropriate to return home with hospice support. Patient at baseline as per wife.   Objective:   Vital Signs:  BP (!) 154/88   Pulse (!) 102   Temp 97.9 F (36.6 C) (Axillary)   Resp (!) 27   SpO2 93%   Physical Exam: General: NAD, sleeping comfortably, chronically ill-appearing Eyes: No drainage noted HENT: Dry mucous membranes Cardiovascular: Tachycardia noted Respiratory: Slightly increased work of breathing noted Skin: Multiple ecchymoses on lower extremities bilaterally b/l Neuro: Sleeping  Imaging:  I personally reviewed recent imaging.   Assessment & Plan:   Assessment: Patient is a 89 year old male with a past medical history of mention, CHF, chronic indwelling Foley catheter, chronic hypoxic and hypercapnic respiratory failure secondary to COPD on supplemental nasal 2-3L cannula at baseline, hypertension, hyperlipidemia, CKD stage III, atrial fibrillation not on anticoagulation due to recent hematuria, chronic hyponatremia, history of C. difficile who was admitted on 10/07/2022 for management of altered mental  status with worsening agitation.  EMS had been called and patient was not responsive at 7 PM upon their presentation.  During admission patient has received medical management for urinary tract infection and acute on chronic hypoxic and hypercapnic respiratory failure, x-ray showing concern for edema versus infiltrate.  Palliative medicine team consulted to assist with complex medical decision making. Of note, patient has been seen by palliative medicine team during prior hospitalizations.  Recommendations/Plan: # Complex medical decision making/goals of care:    -Patient unable to participate in complex medical decision making due to his underlying medical status.                -Patient reported to be back at baseline as per wife. Patient already DNR/DNI. Wife considering patient returning home with hospice support. Home hospice support would be appropraite with patient's FAST score of at least 7C associated with recurrent admissions for septicemia management.                 -  Code Status: Limited: Do not attempt resuscitation (DNR) -DNR-LIMITED -Do Not Intubate/DNI     # Symptom management                -As per primary team   # Psycho-social/Spiritual Support:  - Support System: wife, son , daughter   # Discharge Planning: Home; wife considering if wants home hospice support. TOC coordinating.   Thank you for allowing the palliative care team to participate in the care [name].  [name], DO Palliative Care Provider PMT # [phone]  If patient remains symptomatic despite maximum doses, please call PMT at [phone] between 0700 and 1900. Outside  of these hours, please call attending, as PMT does not have night coverage.  *Please note that this is a verbal dictation therefore any spelling or grammatical errors are due to the "[employer]agon [employer] One" system interpretation.

## 2022-10-11 DIAGNOSIS — Z515 Encounter for palliative care: Secondary | ICD-10-CM | POA: Diagnosis not present

## 2022-10-11 DIAGNOSIS — I5023 Acute on chronic systolic (congestive) heart failure: Secondary | ICD-10-CM | POA: Diagnosis not present

## 2022-10-11 DIAGNOSIS — J9601 Acute respiratory failure with hypoxia: Secondary | ICD-10-CM | POA: Diagnosis not present

## 2022-10-11 DIAGNOSIS — J9602 Acute respiratory failure with hypercapnia: Secondary | ICD-10-CM | POA: Diagnosis not present

## 2022-10-11 DIAGNOSIS — E871 Hypo-osmolality and hyponatremia: Secondary | ICD-10-CM | POA: Diagnosis not present

## 2022-10-11 DIAGNOSIS — N390 Urinary tract infection, site not specified: Secondary | ICD-10-CM | POA: Diagnosis not present

## 2022-10-11 DIAGNOSIS — Z711 Person with feared health complaint in whom no diagnosis is made: Secondary | ICD-10-CM

## 2022-10-11 LAB — LEGIONELLA PNEUMOPHILA SEROGP 1 UR AG: L. pneumophila Serogp 1 Ur Ag: NEGATIVE

## 2022-10-11 MED ORDER — ENSURE ENLIVE PO LIQD: 237.0000 mL | Freq: Two times a day (BID) | ORAL | Status: DC

## 2022-10-11 MED ORDER — METOPROLOL TARTRATE 12.5 MG HALF TABLET
12.5000 mg | ORAL_TABLET | Freq: Two times a day (BID) | ORAL | Status: DC
  Administered 2022-10-11: 12.5 mg via ORAL
  Filled 2022-10-11: qty 1

## 2022-10-11 MED ORDER — HALOPERIDOL LACTATE 2 MG/ML PO CONC
2.0000 mg | ORAL | Status: DC | PRN
  Administered 2022-10-12 (×2): 2 mg via ORAL
  Filled 2022-10-11 (×3): qty 5

## 2022-10-11 MED ORDER — ADULT MULTIVITAMIN W/MINERALS CH: 1.0000 | ORAL_TABLET | Freq: Every day | ORAL | Status: DC

## 2022-10-11 MED ORDER — OXYCODONE HCL 5 MG/5ML PO SOLN
2.5000 mg | ORAL | Status: DC | PRN
  Administered 2022-10-11 (×2): 2.5 mg via ORAL
  Filled 2022-10-11 (×2): qty 5

## 2022-10-11 MED ORDER — MORPHINE SULFATE (PF) 2 MG/ML IV SOLN
2.0000 mg | INTRAVENOUS | Status: DC | PRN
  Administered 2022-10-11 – 2022-10-12 (×3): 2 mg via INTRAVENOUS
  Filled 2022-10-11 (×3): qty 1

## 2022-10-11 NOTE — TOC Progression Note (Signed)
Transition of Care [facility]) - Progression Note    Patient Details  Name: Gerald Valdez MRN: [id number] Date of Birth: January 02, 1929  Transition of Care [facility]) CM/SW Contact  Beckie Busing, RN Phone [phone]  10/11/2022, 3:49 PM  Clinical Narrative:    Cm received message stating that family is requesting comfort with plan for home with [facility]. Referral info has been initiated with Clearence Ped [facility] liaison. TOC will follow.    Expected Discharge Plan: Home w Hospice Care Barriers to Discharge: Continued Medical Work up  Expected Discharge Plan and Services   Discharge Planning Services: CM Consult Post Acute Care Choice: Resumption of Svcs/PTA Provider (home oxygen from [place]; [facility] services (wife does not know name of agency)) Living arrangements for the past 2 months: Single Family Home                                       Social Determinants of Health (SDOH) Interventions SDOH Screenings   Food Insecurity: No Food Insecurity (10/10/2022)  Housing: Low Risk  (10/10/2022)  Transportation Needs: No Transportation Needs (10/10/2022)  Utilities: Not At Risk (10/10/2022)  Tobacco Use: Medium Risk (09/10/2022)    Readmission Risk Interventions    09/13/2022   12:21 PM 08/11/2022   12:01 PM 07/27/2022    4:19 PM  Readmission Risk Prevention Plan  Transportation Screening Complete Complete Complete  PCP or Specialist Appt within 3-5 Days  Complete Complete  HRI or Home Care Consult  Complete   Social Work Consult for Recovery Care Planning/Counseling  Complete Complete  Palliative Care Screening  Complete Complete  Medication Review [facility]) Complete Referral to Pharmacy Complete  PCP or Specialist appointment within 3-5 days of discharge Complete    HRI or Home Care Consult Complete    SW Recovery Care/Counseling Consult Complete    Palliative Care Screening Not Applicable    Skilled Nursing Facility Not Applicable

## 2022-10-11 NOTE — Progress Notes (Signed)
Daily Progress Note   Patient Name: Gerald Valdez       Date: 10/11/2022 DOB: 1928/12/11  Age: 89 y.o. MRN#: [id number] Attending Physician: Willeen Niece, MD Primary Care Physician: [name], MD Admit Date: 10/07/2022 Length of Stay: 4 days  Reason for Consultation/Follow-up: Establishing goals of care  Subjective:   CC: Patient agitated at times laying in the bed; not able to participate in conversation due to medical condition. Spoke with patient's family at bedside.  Following up regarding complex medical decision making.  Subjective:  Reviewed EMR prior to presenting to bedside.  Discussed care with bedside RN for updates as well.  Presented to bedside to meet with patient's family.  Patient seen laying in bed, occasionally agitated by inflation of blood pressure cuff though otherwise sleeping comfortably.  Patient's wife, son, and daughter were present at bedside.  Again introduced myself as a member of the palliative medicine team.  Family has heard recommendation for patient to go home with hospice support.  Noted they would want patient to go home and not to die in a facility like the hospital or long-term care.  Spent time discussing what home hospice support would and would not entail.  Discussed overall trajectory and normalized process of once body shutting down at the end of life.  Spent time discussing hospice philosophy is focusing on symptom management at the end of life.  Spent time providing emotional support via active listening.  Reviewed patient's medications in appropriately continue medications that are for comfort.  Noted patient can continue antibiotics currently to complete antibiotic course though would not recommend extended course as will not overall change prognosis and could cause adverse effects.  Discussed a transition to comfort focused care at this time.  Patient's family all agreeing for transition to comfort focused care now with home hospice  referral.  Patient's wife specifically requesting [facility] home hospice because this is who they have their home health through and really appreciate working with the [facility] group so want to continue that.  All questions answered as able at that time.  Thanked patient's family for allowing me to meet with them today.  Updated IDT about transition to comfort focused care at this time and referral to [facility] home hospice at family's specific request.  Objective:   Vital Signs:  BP (!) 98/46   Pulse 92   Temp 99.2 F (37.3 C) (Axillary)   Resp 14   SpO2 100%   Physical Exam: General: NAD, sleeping comfortably, chronically ill-appearing Eyes: No drainage noted HENT: Dry mucous membranes Cardiovascular: Tachycardia noted Respiratory: no increased work of breathing noted Skin: Multiple ecchymoses on lower extremities bilaterally b/l Neuro: Sleeping  Imaging:  I personally reviewed recent imaging.   Assessment & Plan:   Assessment: Patient is a 89 year old male with a past medical history of mention, CHF, chronic indwelling Foley catheter, chronic hypoxic and hypercapnic respiratory failure secondary to COPD on supplemental nasal 2-3L cannula at baseline, hypertension, hyperlipidemia, CKD stage III, atrial fibrillation not on anticoagulation due to recent hematuria, chronic hyponatremia, history of C. difficile who was admitted on 10/07/2022 for management of altered mental status with worsening agitation.  EMS had been called and patient was not responsive at 7 PM upon their presentation.  During admission patient has received medical management for urinary tract infection and acute on chronic hypoxic and hypercapnic respiratory failure, x-ray showing concern for edema versus infiltrate.  Palliative medicine team consulted to assist with complex medical decision making. Of note,  patient has been seen by palliative medicine team during prior hospitalizations.  Recommendations/Plan: #  Complex medical decision making/goals of care:    -Patient unable to participate in complex medical decision making due to his underlying medical status.                -Extensive discussion with patient's family at bedside which included patient's wife, daughter, and son.  Family agreeing with transition to full comfort focused care at this time.  Continuing antibiotic complete course already ordered in the hospital.  Discontinuing aggressive medical interventions such as imaging, IV fluids, and lab work.  Providing medications for comfort.  Patient's wife specifically requesting referral to home hospice through [facility] since that is who they have their home health group through.                -  Code Status: Limited: Do not attempt resuscitation (DNR) -DNR-LIMITED -Do Not Intubate/DNI     # Symptom management    -Pain/Dyspnea, acute in the setting of end-of-life care                 -Start oxycodone 2.5 mg every 3 hours as needed solution for pain or dyspnea.   -Start IV morphine 2 mg every hour as needed for breakthrough pain or dyspnea management after oral oxycodone solution.                  -Anxiety/agitation, in the setting of end-of-life care                               -Family noted paradoxical reaction to Ativan so would not recommend starting this medication.                               -Start Haldol 2 mg every 4 hours as needed solution.  Continue IV Haldol 2 mg every 6 hours as needed for breakthrough after oral solution.   # Psycho-social/Spiritual Support:  - Support System: wife, son , daughter   # Discharge Planning: Home with hospice via [facility] at family's request.  TOC assisting with coordination of care and discharge planning.  Thank you for allowing the palliative care team to participate in the care Gerald Valdez.  Gerald Morin, DO Palliative Care Provider PMT # [phone]  If patient remains symptomatic despite maximum doses, please call PMT at [phone]  between 0700 and 1900. Outside of these hours, please call attending, as PMT does not have night coverage.  *Please note that this is a verbal dictation therefore any spelling or grammatical errors are due to the "Dragon [employer] One" system interpretation.

## 2022-10-11 NOTE — Progress Notes (Signed)
PROGRESS NOTE    Gerald Valdez  ZOX:[id number] DOB: 07/05/1928 DOA: 10/07/2022 PCP: [name], MD   Brief Narrative:  This 89 years old Male with PMH significant for combined CHF with LVEF 40 to 45%, Chronic indwelling Foley catheter, Chronic hypoxic and hypercapnic respiratory failure due to COPD, on 2 to 3 L of supplemental oxygen at baseline, hypertension, hyperlipidemia, CKD stage IIIa, atrial fibrillation not on anticoagulation due to recurrent hematuria, Chronic hyponatremia, history of C. difficile in the past, dementia presented in the ED with altered mentation, patient has been more agitated and Patient was found not responsive at 7 PM.  EMS was called.  Patient is found to have UTI,  requiring IV antibiotics, also was hypoxic and hypercapnic requiring BiPAP.  Patient was admitted for further evaluation.  Palliative care consulted to discuss goals of care.  Family has transitioned care to full comfort care.  Assessment & Plan:   Principal Problem:   Acute respiratory failure (HCC) Active Problems:   Complicated UTI (urinary tract infection)   Acute metabolic encephalopathy   Chronic systolic CHF preserved EF 40 to 45% (congestive heart failure) (HCC)   Hyponatremia   COPD (chronic obstructive pulmonary disease) (HCC)   Malignant neoplasm of overlapping sites of bladder (HCC)   AKI (acute kidney injury) (HCC)   Protein-calorie malnutrition, moderate (HCC)   Chronic anemia   Acute on chronic respiratory failure with hypoxia and hypercapnia (HCC)   CAP (community acquired pneumonia)   Palliative care encounter   Need for emotional support   Counseling and coordination of care   Acute on chronic systolic congestive heart failure (HCC)   Dementia (HCC)   Respiratory distress  Acute metabolic encephalopathy: Acute on chronic hypoxic and hypercapnic respiratory failure: This has been a recurrent issue in the setting of hypercapnia/ hypoxia due to COPD. Patient was  hypoxic and hypercapnic on arrival, required BiPAP. Continue BiPAP as needed and at night. CT head unremarkable. Chest x-ray showed infiltrate. Continue supplemental oxygen and wean as tolerated. Family reports patient is back to his baseline mental status. Pulmonology consult appreciated recommended to continue current plan. He is transition to high flow nasal cannula 6 L/min. Care transitioned to full comfort care.  Complicated UTI: ID has felt that this was more colonization.  UA shows no evidence of bacteria. Antibiotic discontinued.  Care transitioned to full comfort care.  Chronic combined systolic and diastolic CHF: Patient was initially given a dose of Lasix, his blood pressure went down.   He does not appear to be fluid overloaded at this time. There is no peripheral edema noted. Continue to monitor clinical status.  Hold off on further Lasix.  Hyponatremia: Chronic recurrent issue, Patient has responded in the past with gentle hydration. Patient appears clinically dry with AKI.   Continue IV hydration and monitor serum creatinine Serum sodium improving 117> 122> 124> 125> 128 >132  COPD:  No wheezing or history of cough. Chest x-ray did show possibility of infiltrate. Continue home inhalers and DuoNeb as needed.  Community-acquired pneumonia: Chest x-ray did show possibility of infiltrate. Initiate IV Rocephin and Zithromax. Antibiotic discontinued.  Care transitioned to full comfort care.  Acute kidney injury: > Resolved. Baseline serum creatinine 1.2, presented with creatinine 1.47. Continue IV hydration.  Monitor serum creatinine Serum creatinine improving 1.47> 1.40>1.32 > 1.14  Protein calorie malnutrition: Obtain nutritional consult.  Goals of care discussion: Palliative care consulted to discuss goals of care. Patient's wife wants patient to go home with home hospice.  Care transitioned to full comfort care. Plan: Discharge home with home hospice  DVT  prophylaxis: SCDs Code Status: DNR Family Communication: Wife at bed side. Disposition Plan:   Status is: Inpatient Remains inpatient appropriate because: Patient admitted for acute on chronic hypoxic and hypercapnic respiratory failure due to COPD and pneumonia, required antibiotics and BiPAP.  Now palliative care consulted, care transitioned to full comfort care.  Consultants:  Pulmonology Palliative care  Procedures: BIPAP  Antimicrobials:  Anti-infectives (From admission, onward)    Start     Dose/Rate Route Frequency Ordered Stop   10/08/22 0515  cefTRIAXone (ROCEPHIN) 1 g in sodium chloride 0.9 % 100 mL IVPB  Status:  Discontinued        1 g 200 mL/hr over 30 Minutes Intravenous Every 24 hours 10/08/22 0415 10/08/22 0421   10/08/22 0200  cefTRIAXone (ROCEPHIN) 2 g in sodium chloride 0.9 % 100 mL IVPB        2 g 200 mL/hr over 30 Minutes Intravenous Every 24 hours 10/08/22 0056 10/13/22 0159   10/08/22 0200  azithromycin (ZITHROMAX) 500 mg in sodium chloride 0.9 % 250 mL IVPB        500 mg 250 mL/hr over 60 Minutes Intravenous Every 24 hours 10/08/22 0056 10/13/22 0159      Subjective: Patient was seen and examined at bedside. Overnight events noted.   Patient appears restless with intermittent episodes of agitation. He is weaned down to high flow nasal cannula at 6 L/min. He appears at his baseline mental status. Care transitioned to full comfort care.  Objective: Vitals:   10/11/22 0700 10/11/22 0800 10/11/22 0900 10/11/22 1056  BP: (!) 111/56 (!) 114/46 (!) 98/46 (!) 116/47  Pulse: (!) 113 86 92 (!) 105  Resp: 18 16 14    Temp:  99.2 F (37.3 C)    TempSrc:  Axillary    SpO2: 100% 100% 100%     Intake/Output Summary (Last 24 hours) at 10/11/2022 1118 Last data filed at 10/11/2022 0800 Gross per 24 hour  Intake 2336.86 ml  Output 600 ml  Net 1736.86 ml   There were no vitals filed for this visit.  Examination:  General exam: Appears comfortable, calm,  deconditioned, sick looking, not in any distress. Respiratory system: Decreased breath sounds. Respiratory effort normal.  No accessory muscle use, RR 14 Cardiovascular system: S1 & S2 heard, RRR. No JVD, murmurs, rubs, gallops or clicks. No pedal edema. Gastrointestinal system: Abdomen is non distended, soft and non tender. Normal bowel sounds heard. Central nervous system: Confused, restless. No focal neurological deficits. Extremities: No edema, no cyanosis, no clubbing Skin: No rashes, lesions or ulcers Psychiatry: Not assessed.    Data Reviewed: I have personally reviewed following labs and imaging studies  CBC: Recent Labs  Lab 10/07/22 2118 10/07/22 2132 10/08/22 0725 10/10/22 0328  WBC 11.3*  --  7.6 11.0*  NEUTROABS 8.9*  --   --   --   HGB 10.4* 10.9* 8.6* 8.9*  HCT 33.5* 32.0* 27.1* 28.7*  MCV 92.5  --  92.2 96.3  PLT 273  --  209 222   Basic Metabolic Panel: Recent Labs  Lab 10/07/22 2309 10/08/22 0121 10/08/22 0725 10/08/22 1421 10/08/22 2008 10/09/22 0850 10/10/22 0328  NA  --    < > 124* 125* 122* 128* 132*  K  --    < > 3.6 4.0 3.8 3.4* 3.6  CL  --    < > 75* 79* 77* 84* 92*  CO2  --    < >  37* 36* 35* 35* 32  GLUCOSE  --    < > 156* 108* 123* 123* 127*  BUN  --    < > 23 22 22 21 16   CREATININE  --    < > 1.47* 1.35* 1.40* 1.32* 1.14  CALCIUM  --    < > 8.1* 8.2* 8.1* 8.4* 8.4*  MG 2.2  --   --   --   --   --  2.1  PHOS 4.1  --   --   --   --   --  2.4*   < > = values in this interval not displayed.   GFR: CrCl cannot be calculated (Unknown ideal weight.). Liver Function Tests: Recent Labs  Lab 10/07/22 2118  AST 48*  ALT 30  ALKPHOS 81  BILITOT 0.6  PROT 7.1  ALBUMIN 4.0   No results for input(s): "LIPASE", "AMYLASE" in the last 168 hours. No results for input(s): "AMMONIA" in the last 168 hours. Coagulation Profile: No results for input(s): "INR", "PROTIME" in the last 168 hours. Cardiac Enzymes: Recent Labs  Lab 10/07/22 2309   CKTOTAL 237   BNP (last 3 results) No results for input(s): "PROBNP" in the last 8760 hours. HbA1C: No results for input(s): "HGBA1C" in the last 72 hours. CBG: No results for input(s): "GLUCAP" in the last 168 hours. Lipid Profile: No results for input(s): "CHOL", "HDL", "LDLCALC", "TRIG", "CHOLHDL", "LDLDIRECT" in the last 72 hours. Thyroid Function Tests: No results for input(s): "TSH", "T4TOTAL", "FREET4", "T3FREE", "THYROIDAB" in the last 72 hours.  Anemia Panel: No results for input(s): "VITAMINB12", "FOLATE", "FERRITIN", "TIBC", "IRON", "RETICCTPCT" in the last 72 hours.  Sepsis Labs: Recent Labs  Lab 10/07/22 2309  PROCALCITON <0.10    Recent Results (from the past 240 hour(s))  SARS Coronavirus 2 by RT PCR (hospital order, performed in [facility] hospital lab) *cepheid single result test* Anterior Nasal Swab     Status: None   Collection Time: 10/07/22  9:31 PM   Specimen: Anterior Nasal Swab  Result Value Ref Range Status   SARS Coronavirus 2 by RT PCR NEGATIVE NEGATIVE Final    Comment: (NOTE) SARS-CoV-2 target nucleic acids are NOT DETECTED.  The SARS-CoV-2 RNA is generally detectable in upper and lower respiratory specimens during the acute phase of infection. The lowest concentration of SARS-CoV-2 viral copies this assay can detect is 250 copies / mL. A negative result does not preclude SARS-CoV-2 infection and should not be used as the sole basis for treatment or other patient management decisions.  A negative result may occur with improper specimen collection / handling, submission of specimen other than nasopharyngeal swab, presence of viral mutation(s) within the areas targeted by this assay, and inadequate number of viral copies (<250 copies / mL). A negative result must be combined with clinical observations, patient history, and epidemiological information.  Fact Sheet for Patients:   RoadLapTop.co.za  Fact Sheet for  Healthcare Providers: http://kim-miller.com/  This test is not yet approved or  cleared by the [place] FDA and has been authorized for detection and/or diagnosis of SARS-CoV-2 by FDA under an Emergency Use Authorization (EUA).  This EUA will remain in effect (meaning this test can be used) for the duration of the COVID-19 declaration under Section 564(b)(1) of the Act, 21 U.S.C. section 360bbb-3(b)(1), unless the authorization is terminated or revoked sooner.  Performed at [facility], [address] [place], [place] [address]   Urine Culture  Status: Abnormal   Collection Time: 10/07/22  9:49 PM   Specimen: Urine, Random  Result Value Ref Range Status   Specimen Description   Final    URINE, RANDOM Performed at [facility], [address], [place], [place] [address]    Special Requests   Final    NONE Reflexed from [id number] Performed at [facility], [address] [place], [place] [address]    Culture MULTIPLE SPECIES PRESENT, SUGGEST RECOLLECTION (A)  Final   Report Status 10/09/2022 FINAL  Final  MRSA Next Gen by PCR, Nasal     Status: None   Collection Time: 10/08/22  1:16 AM   Specimen: Nasal Mucosa; Nasal Swab  Result Value Ref Range Status   MRSA by PCR Next Gen NOT DETECTED NOT DETECTED Final    Comment: (NOTE) The GeneXpert MRSA Assay (FDA approved for NASAL specimens only), is one component of a comprehensive MRSA colonization surveillance program. It is not intended to diagnose MRSA infection nor to guide or monitor treatment for MRSA infections. Test performance is not FDA approved in patients less than 17 years old. Performed at [facility], [address], [place], [place] [address]    Radiology Studies: No results found.  Scheduled Meds:  Chlorhexidine Gluconate Cloth  6 each Topical Daily   feeding supplement  237 mL Oral BID BM   metoprolol  tartrate  12.5 mg Oral BID   pantoprazole (PROTONIX) IV  40 mg Intravenous QHS   Continuous Infusions:  azithromycin Stopped (10/11/22 0315)   cefTRIAXone (ROCEPHIN)  IV Stopped (10/11/22 0300)   dextrose 5 % and 0.9 % NaCl 75 mL/hr at 10/11/22 0800     LOS: 4 days    Time spent: 35 mins    Willeen Niece, MD [facility]   If 7PM-7AM, please contact night-coverage

## 2022-10-12 ENCOUNTER — Telehealth (HOSPITAL_COMMUNITY): Payer: Self-pay | Admitting: Pharmacy Technician

## 2022-10-12 ENCOUNTER — Other Ambulatory Visit (HOSPITAL_COMMUNITY): Payer: Self-pay

## 2022-10-12 ENCOUNTER — Other Ambulatory Visit: Payer: Self-pay | Admitting: Family Medicine

## 2022-10-12 DIAGNOSIS — J9601 Acute respiratory failure with hypoxia: Secondary | ICD-10-CM | POA: Diagnosis not present

## 2022-10-12 DIAGNOSIS — J9602 Acute respiratory failure with hypercapnia: Secondary | ICD-10-CM | POA: Diagnosis not present

## 2022-10-12 MED ORDER — HALOPERIDOL LACTATE 2 MG/ML PO CONC: 2.0000 mg | ORAL | 0 refills | Status: DC | PRN

## 2022-10-12 MED ORDER — HALOPERIDOL LACTATE 2 MG/ML PO CONC: 2.0000 mg | ORAL | 0 refills | Status: AC | PRN

## 2022-10-12 MED ORDER — OXYCODONE HCL 5 MG/5ML PO SOLN: 2.5000 mg | ORAL | 0 refills | Status: AC | PRN

## 2022-10-12 MED ORDER — OXYCODONE HCL 5 MG/5ML PO SOLN: 2.5000 mg | ORAL | 0 refills | Status: DC | PRN

## 2022-10-12 NOTE — Telephone Encounter (Signed)
 Clinical Questions have been submitted

## 2022-10-12 NOTE — Progress Notes (Signed)
   10/23/2022 1217  AVS Discharge Documentation  AVS Discharge Instructions Including Medications Provided to patient/caregiver  Name of Person Receiving AVS Discharge Instructions Including Medications Augustin Schooling  Name of Clinician That Reviewed AVS Discharge Instructions Including Medications Gayland Curry RN & Jacqualine Mau RN

## 2022-10-12 NOTE — Telephone Encounter (Signed)
Pharmacy Patient Advocate Encounter   Received notification from Fax that prior authorization for Haloperidol Lactate 2MG /ML concentrate is required/requested.   Insurance verification completed.   The patient is insured through [facility] .   Per test claim: PA required; PA started via CoverMyMeds. KEY [id number] . Waiting for clinical questions to populate.

## 2022-10-12 NOTE — Discharge Instructions (Signed)
Patient is being discharged home with home hospice. Continue comfort care measures.

## 2022-10-12 NOTE — Discharge Summary (Signed)
Physician Discharge Summary  Gerald Valdez GNF:[id number] DOB: May 16, 1928 DOA: 10/07/2022  PCP: [name], MD  Admit date: 10/07/2022  Discharge date: 10/22/2022  Admitted From: Home.  Disposition:  (Residential Hospice)  Recommendations for Outpatient Follow-up:  Patient is being discharged home with home hospice. Continue comfort care measures.  Home Health:None Equipment/Devices:Home oxygen  Discharge Condition: Stable CODE STATUS: DNR Diet recommendation: Dysphagia 1 Diet  Brief Summary/ Hospital Course: This 89 years old Male with PMH significant for combined CHF with LVEF 40 to 45%, Chronic indwelling Foley catheter, Chronic hypoxic and hypercapnic respiratory failure due to COPD, on 2 to 3 L of supplemental oxygen at baseline, hypertension, hyperlipidemia, CKD stage IIIa, atrial fibrillation not on anticoagulation due to recurrent hematuria, Chronic hyponatremia, history of C. difficile in the past, dementia presented in the ED with altered mentation, Patient has been more agitated and patient was found not responsive at 7 PM.  EMS was called.  Patient was found to have UTI,  requiring IV antibiotics, also was hypoxic and hypercapnic requiring BiPAP.  Patient was admitted for further evaluation.  Patient has significant decline in overall functioning.  Palliative care consulted to discuss goals of care.  Family is aware about patient's poor prognosis and poor outcome.  Family has transitioned care to full comfort care.  Family is agreeable with home with home hospice.  Patient is being discharged home with comfort care.  Discharge Diagnoses:  Principal Problem:   Acute respiratory failure (HCC) Active Problems:   Complicated UTI (urinary tract infection)   Acute metabolic encephalopathy   Chronic systolic CHF preserved EF 40 to 45% (congestive heart failure) (HCC)   Hyponatremia   COPD (chronic obstructive pulmonary disease) (HCC)   Malignant neoplasm of overlapping  sites of bladder (HCC)   AKI (acute kidney injury) (HCC)   Protein-calorie malnutrition, moderate (HCC)   Chronic anemia   Acute on chronic respiratory failure with hypoxia and hypercapnia (HCC)   CAP (community acquired pneumonia)   Palliative care encounter   Need for emotional support   Counseling and coordination of care   Acute on chronic systolic congestive heart failure (HCC)   Dementia (HCC)   Respiratory distress   Concern about end of life  Acute metabolic encephalopathy: Acute on chronic hypoxic and hypercapnic respiratory failure: This has been a recurrent issue in the setting of hypercapnia/ hypoxia due to COPD. Patient was hypoxic and hypercapnic on arrival, required BiPAP. Continue BiPAP as needed and at night. CT head unremarkable. Chest x-ray showed infiltrate. Continue supplemental oxygen and wean as tolerated. Family reports patient is back to his baseline mental status. Pulmonology consult appreciated recommended to continue current plan. He was transitioned to high flow nasal cannula 6 L/min. Care transitioned to full comfort care. Patient being discharged to home with home hospice.   Complicated UTI: ID has felt that this was more colonization.  UA shows no evidence of bacteria. Antibiotic discontinued.  Care transitioned to full comfort care.   Chronic combined systolic and diastolic CHF: Patient was initially given a dose of Lasix, his blood pressure went down.   He does not appear to be fluid overloaded at this time. There is no peripheral edema noted. Continue to monitor clinical status.  Hold off on further Lasix.   Hyponatremia: Chronic recurrent issue, Patient has responded in the past with gentle hydration. Patient appears clinically dry with AKI.   Continue IV hydration and monitor serum creatinine Serum sodium improving 117> 122> 124> 125> 128 >132  COPD:  No wheezing or history of cough. Chest x-ray did show possibility of  infiltrate. Continue home inhalers and DuoNeb as needed.   Community-acquired pneumonia: Chest x-ray did show possibility of infiltrate. Initiated IV Rocephin and Zithromax. Antibiotic discontinued.  Care transitioned to full comfort care.   Acute kidney injury: > Resolved. Baseline serum creatinine 1.2, presented with creatinine 1.47. Continue IV hydration.  Monitor serum creatinine Serum creatinine improving 1.47> 1.40>1.32 > 1.14   Protein calorie malnutrition: Nutritional consult. Dysphagia 1 diet.   Goals of care discussion: Palliative care consulted to discuss goals of care. Patient's wife wants patient to go home with home hospice. Care transitioned to full comfort care. Plan: Discharge home with home hospice.  Discharge Instructions  Discharge Instructions     Call MD for:  difficulty breathing, headache or visual disturbances   Complete by: As directed    Call MD for:  persistant dizziness or light-headedness   Complete by: As directed    Call MD for:  persistant nausea and vomiting   Complete by: As directed    Diet - low sodium heart healthy   Complete by: As directed    Diet Carb Modified   Complete by: As directed    Discharge instructions   Complete by: As directed    Patient is being discharged home with home hospice. Continue comfort care measures.   Increase activity slowly   Complete by: As directed    No wound care   Complete by: As directed       Allergies as of 10/27/2022       Reactions   Prednisone Other (See Comments)   Unknown reaction        Medication List     STOP taking these medications    albuterol (2.5 MG/3ML) 0.083% nebulizer solution Commonly known as: PROVENTIL   ascorbic acid 500 MG tablet Commonly known as: VITAMIN C   CRANBERRY PO   escitalopram 10 MG tablet Commonly known as: LEXAPRO   feeding supplement Liqd   furosemide 40 MG tablet Commonly known as: LASIX   loratadine 10 MG tablet Commonly known as:  CLARITIN   metoprolol succinate 25 MG 24 hr tablet Commonly known as: TOPROL-XL   multivitamin with minerals Tabs tablet   pantoprazole 40 MG tablet Commonly known as: PROTONIX   polyethylene glycol 17 g packet Commonly known as: MIRALAX / GLYCOLAX   potassium chloride SA 20 MEQ tablet Commonly known as: KLOR-CON M   rosuvastatin 20 MG tablet Commonly known as: CRESTOR   saccharomyces boulardii 250 MG capsule Commonly known as: FLORASTOR   tamsulosin 0.4 MG Caps capsule Commonly known as: FLOMAX   umeclidinium-vilanterol 62.5-25 MCG/ACT Aepb Commonly known as: ANORO ELLIPTA       TAKE these medications    haloperidol 2 MG/ML solution Commonly known as: HALDOL Take 1 mL (2 mg total) by mouth every 4 (four) hours as needed for up to 5 days for agitation.   oxyCODONE 5 MG/5ML solution Commonly known as: ROXICODONE Take 2.5 mLs (2.5 mg total) by mouth every 3 (three) hours as needed for up to 3 days for moderate pain or severe pain (dyspnea).   OXYGEN Inhale 3 L/min into the lungs continuous.        Follow-up Information     Avva, Ravisankar, MD Follow up in 1 week(s).   Specialty: Internal Medicine Contact information: [address] [place] [address] [phone]  Allergies  Allergen Reactions   Prednisone Other (See Comments)    Unknown reaction    Consultations: Palliative care Pulmonology   Procedures/Studies: CT Head Wo Contrast  Result Date: 10/07/2022 CLINICAL DATA:  Altered mental status.  History of dementia. EXAM: CT HEAD WITHOUT CONTRAST TECHNIQUE: Contiguous axial images were obtained from the base of the skull through the vertex without intravenous contrast. RADIATION DOSE REDUCTION: This exam was performed according to the departmental dose-optimization program which includes automated exposure control, adjustment of the mA and/or kV according to patient size and/or use of iterative reconstruction technique.  COMPARISON:  Head CT dated 08/01/2022. FINDINGS: Evaluation of this exam is limited due to respiratory motion artifact. Brain: Moderate age-related atrophy and chronic microvascular ischemic changes. There is no acute intracranial hemorrhage. No mass effect or midline shift. No extra-axial fluid collection. Vascular: No hyperdense vessel or unexpected calcification. Skull: Normal. Negative for fracture or focal lesion. Sinuses/Orbits: The visualized paranasal sinuses and the mastoid air cells are clear. Other: None IMPRESSION: 1. No acute intracranial pathology. 2. Moderate age-related atrophy and chronic microvascular ischemic changes. Electronically Signed   By: Elgie Collard M.D.   On: 10/07/2022 21:53   DG Chest Portable 1 View  Result Date: 10/07/2022 CLINICAL DATA:  Shortness of breath.  Altered mental status. EXAM: PORTABLE CHEST 1 VIEW COMPARISON:  09/10/2022 FINDINGS: Heart size and pulmonary vascularity are normal for technique. Probable emphysematous changes in the lungs. Slight interstitial pattern to the lung bases may represent mild edema or pneumonia. No pleural effusions. No pneumothorax. Mediastinal contours appear intact. Calcification of the aorta. IMPRESSION: 1. Emphysematous changes in the lungs. 2. Slight interstitial pattern to the lung bases appears progressed since prior study, possibly indicating early edema or pneumonia. Electronically Signed   By: Burman Nieves M.D.   On: 10/07/2022 21:20     Subjective: Patient was seen and examined at bedside.  Overnight events noted. Patient remains comfortable, appears at his baseline.  Patient remains on supplemental oxygen. Patient is being discharged home with home hospice.  Discharge Exam: Vitals:   10/21/2022 1000 11/01/2022 1100  BP:    Pulse: (!) 110 93  Resp: (!) 28 (!) 28  Temp:    SpO2: 100% 100%   Vitals:   10/28/2022 0800 10/11/2022 0900 10/18/2022 1000 10/20/2022 1100  BP:      Pulse: 91 (!) 103 (!) 110 93  Resp: 19 19  (!) 28 (!) 28  Temp:      TempSrc:      SpO2: 93% 96% 100% 100%    General: Pt is alert, awake, not in acute distress Cardiovascular: RRR, S1/S2 +, no rubs, no gallops Respiratory: CTA bilaterally, no wheezing, no rhonchi Abdominal: Soft, NT, ND, bowel sounds + Extremities: no edema, no cyanosis    The results of significant diagnostics from this hospitalization (including imaging, microbiology, ancillary and laboratory) are listed below for reference.     Microbiology: Recent Results (from the past 240 hour(s))  SARS Coronavirus 2 by RT PCR (hospital order, performed in [facility] hospital lab) *cepheid single result test* Anterior Nasal Swab     Status: None   Collection Time: 10/07/22  9:31 PM   Specimen: Anterior Nasal Swab  Result Value Ref Range Status   SARS Coronavirus 2 by RT PCR NEGATIVE NEGATIVE Final    Comment: (NOTE) SARS-CoV-2 target nucleic acids are NOT DETECTED.  The SARS-CoV-2 RNA is generally detectable in upper and lower respiratory specimens during the acute phase of infection.  The lowest concentration of SARS-CoV-2 viral copies this assay can detect is 250 copies / mL. A negative result does not preclude SARS-CoV-2 infection and should not be used as the sole basis for treatment or other patient management decisions.  A negative result may occur with improper specimen collection / handling, submission of specimen other than nasopharyngeal swab, presence of viral mutation(s) within the areas targeted by this assay, and inadequate number of viral copies (<250 copies / mL). A negative result must be combined with clinical observations, patient history, and epidemiological information.  Fact Sheet for Patients:   RoadLapTop.co.za  Fact Sheet for Healthcare Providers: http://kim-miller.com/  This test is not yet approved or  cleared by the [place] FDA and has been authorized for detection and/or  diagnosis of SARS-CoV-2 by FDA under an Emergency Use Authorization (EUA).  This EUA will remain in effect (meaning this test can be used) for the duration of the COVID-19 declaration under Section 564(b)(1) of the Act, 21 U.S.C. section 360bbb-3(b)(1), unless the authorization is terminated or revoked sooner.  Performed at [facility], [address] [place], [place] [address]   Urine Culture     Status: Abnormal   Collection Time: 10/07/22  9:49 PM   Specimen: Urine, Random  Result Value Ref Range Status   Specimen Description   Final    URINE, RANDOM Performed at [facility], [address] [place], [place] [address]    Special Requests   Final    NONE Reflexed from [id number] Performed at [facility], [address] [place], [place] [address]    Culture MULTIPLE SPECIES PRESENT, SUGGEST RECOLLECTION (A)  Final   Report Status 10/09/2022 FINAL  Final  MRSA Next Gen by PCR, Nasal     Status: None   Collection Time: 10/08/22  1:16 AM   Specimen: Nasal Mucosa; Nasal Swab  Result Value Ref Range Status   MRSA by PCR Next Gen NOT DETECTED NOT DETECTED Final    Comment: (NOTE) The GeneXpert MRSA Assay (FDA approved for NASAL specimens only), is one component of a comprehensive MRSA colonization surveillance program. It is not intended to diagnose MRSA infection nor to guide or monitor treatment for MRSA infections. Test performance is not FDA approved in patients less than 28 years old. Performed at [facility], [address], [place], [place] [address]      Labs: BNP (last 3 results) Recent Labs    08/11/22 0020 09/10/22 2046 10/07/22 2120  BNP 851.4* 472.8* 467.0*   Basic Metabolic Panel: Recent Labs  Lab 10/07/22 2309 10/08/22 0121 10/08/22 0725 10/08/22 1421 10/08/22 2008 10/09/22 0850 10/10/22 0328  NA  --    < > 124* 125* 122* 128* 132*  K  --    < > 3.6 4.0 3.8 3.4* 3.6  CL   --    < > 75* 79* 77* 84* 92*  CO2  --    < > 37* 36* 35* 35* 32  GLUCOSE  --    < > 156* 108* 123* 123* 127*  BUN  --    < > 23 22 22 21 16   CREATININE  --    < > 1.47* 1.35* 1.40* 1.32* 1.14  CALCIUM  --    < > 8.1* 8.2* 8.1* 8.4* 8.4*  MG 2.2  --   --   --   --   --  2.1  PHOS 4.1  --   --   --   --   --  2.4*   < > = values in this interval not displayed.   Liver Function Tests: Recent Labs  Lab 10/07/22 2118  AST 48*  ALT 30  ALKPHOS 81  BILITOT 0.6  PROT 7.1  ALBUMIN 4.0   No results for input(s): "LIPASE", "AMYLASE" in the last 168 hours. No results for input(s): "AMMONIA" in the last 168 hours. CBC: Recent Labs  Lab 10/07/22 2118 10/07/22 2132 10/08/22 0725 10/10/22 0328  WBC 11.3*  --  7.6 11.0*  NEUTROABS 8.9*  --   --   --   HGB 10.4* 10.9* 8.6* 8.9*  HCT 33.5* 32.0* 27.1* 28.7*  MCV 92.5  --  92.2 96.3  PLT 273  --  209 222   Cardiac Enzymes: Recent Labs  Lab 10/07/22 2309  CKTOTAL 237   BNP: Invalid input(s): "POCBNP" CBG: No results for input(s): "GLUCAP" in the last 168 hours. D-Dimer No results for input(s): "DDIMER" in the last 72 hours. Hgb A1c No results for input(s): "HGBA1C" in the last 72 hours. Lipid Profile No results for input(s): "CHOL", "HDL", "LDLCALC", "TRIG", "CHOLHDL", "LDLDIRECT" in the last 72 hours. Thyroid function studies No results for input(s): "TSH", "T4TOTAL", "T3FREE", "THYROIDAB" in the last 72 hours.  Invalid input(s): "FREET3" Anemia work up No results for input(s): "VITAMINB12", "FOLATE", "FERRITIN", "TIBC", "IRON", "RETICCTPCT" in the last 72 hours. Urinalysis    Component Value Date/Time   COLORURINE YELLOW 10/07/2022 2149   APPEARANCEUR CLOUDY (A) 10/07/2022 2149   LABSPEC 1.009 10/07/2022 2149   PHURINE 7.0 10/07/2022 2149   GLUCOSEU NEGATIVE 10/07/2022 2149   HGBUR LARGE (A) 10/07/2022 2149   BILIRUBINUR NEGATIVE 10/07/2022 2149   KETONESUR NEGATIVE 10/07/2022 2149   PROTEINUR 100 (A) 10/07/2022  2149   NITRITE POSITIVE (A) 10/07/2022 2149   LEUKOCYTESUR LARGE (A) 10/07/2022 2149   Sepsis Labs Recent Labs  Lab 10/07/22 2118 10/08/22 0725 10/10/22 0328  WBC 11.3* 7.6 11.0*   Microbiology Recent Results (from the past 240 hour(s))  SARS Coronavirus 2 by RT PCR (hospital order, performed in [facility] hospital lab) *cepheid single result test* Anterior Nasal Swab     Status: None   Collection Time: 10/07/22  9:31 PM   Specimen: Anterior Nasal Swab  Result Value Ref Range Status   SARS Coronavirus 2 by RT PCR NEGATIVE NEGATIVE Final    Comment: (NOTE) SARS-CoV-2 target nucleic acids are NOT DETECTED.  The SARS-CoV-2 RNA is generally detectable in upper and lower respiratory specimens during the acute phase of infection. The lowest concentration of SARS-CoV-2 viral copies this assay can detect is 250 copies / mL. A negative result does not preclude SARS-CoV-2 infection and should not be used as the sole basis for treatment or other patient management decisions.  A negative result may occur with improper specimen collection / handling, submission of specimen other than nasopharyngeal swab, presence of viral mutation(s) within the areas targeted by this assay, and inadequate number of viral copies (<250 copies / mL). A negative result must be combined with clinical observations, patient history, and epidemiological information.  Fact Sheet for Patients:   RoadLapTop.co.za  Fact Sheet for Healthcare Providers: http://kim-miller.com/  This test is not yet approved or  cleared by the [place] FDA and has been authorized for detection and/or diagnosis of SARS-CoV-2 by FDA under an Emergency Use Authorization (EUA).  This EUA will remain in effect (meaning this test can be used) for the duration of the COVID-19 declaration under Section 564(b)(1) of the Act, 21  U.S.C. section 360bbb-3(b)(1), unless the authorization is  terminated or revoked sooner.  Performed at [facility], [address] [place], [place] [address]   Urine Culture     Status: Abnormal   Collection Time: 10/07/22  9:49 PM   Specimen: Urine, Random  Result Value Ref Range Status   Specimen Description   Final    URINE, RANDOM Performed at [facility], [address] [place], [place] [address]    Special Requests   Final    NONE Reflexed from [phone] [id number] Performed at [facility], [address] [place], [place] [address]    Culture MULTIPLE SPECIES PRESENT, SUGGEST RECOLLECTION (A)  Final   Report Status 10/09/2022 FINAL  Final  MRSA Next Gen by PCR, Nasal     Status: None   Collection Time: 10/08/22  1:16 AM   Specimen: Nasal Mucosa; Nasal Swab  Result Value Ref Range Status   MRSA by PCR Next Gen NOT DETECTED NOT DETECTED Final    Comment: (NOTE) The GeneXpert MRSA Assay (FDA approved for NASAL specimens only), is one component of a comprehensive MRSA colonization surveillance program. It is not intended to diagnose MRSA infection nor to guide or monitor treatment for MRSA infections. Test performance is not FDA approved in patients less than 3 years old. Performed at [facility], [address] [place], [place] [address]      Time coordinating discharge: Over 30 minutes  SIGNED:   Willeen Niece, MD  [facility] 10/16/2022, 11:51 AM Pager   If 7PM-7AM, please contact night-coverage

## 2022-10-12 NOTE — TOC Transition Note (Signed)
Transition of Care [facility]) - CM/SW Discharge Note   Patient Details  Name: Gerald Valdez MRN: [id number] Date of Birth: [birth year]  Transition of [facility]) CM/SW Contact:  Amada Jupiter, LCSW Phone Number: 10/10/2022, 12:55 PM   Clinical Narrative:     Alerted by MD that pt medically ready for dc home with hospice.  Met with family and confirming plan that home hospice services will resume with [facility] (confirmed with liaison as well) and that all needed DME is in the home including home O2.  Family aware [facility] will provide dc transportation.  [facility] called at 11:40am.  No further TOC needs.  Final next level of care: Home w Hospice Care Barriers to Discharge: Barriers Resolved   Patient Goals and CMS Choice      Discharge Placement                         Discharge Plan and Services Additional resources added to the After Visit Summary for     Discharge Planning Services: CM Consult Post Acute Care Choice: Resumption of Svcs/PTA Provider (home oxygen from [place]; [facility] services (wife does not know name of agency))          DME Arranged: N/A DME Agency: NA       HH Arranged: RN HH Agency: Other - See comment ([facility] to resume care) Date HH Agency Contacted: 10/11/22   Representative spoke with at [facility] Agency: Candise Bowens  Social Determinants of Health (SDOH) Interventions SDOH Screenings   Food Insecurity: No Food Insecurity (10/10/2022)  Housing: Low Risk  (10/10/2022)  Transportation Needs: No Transportation Needs (10/10/2022)  Utilities: Not At Risk (10/10/2022)  Tobacco Use: Medium Risk (09/10/2022)     Readmission Risk Interventions    11/08/2022   12:54 PM 09/13/2022   12:21 PM 08/11/2022   12:01 PM  Readmission Risk Prevention Plan  Transportation Screening Complete Complete Complete  PCP or Specialist Appt within 3-5 Days   Complete  HRI or Home Care Consult   Complete  Social Work Consult for Recovery Care Planning/Counseling   Complete  Palliative  Care Screening   Complete  Medication Review Oceanographer) Complete Complete Referral to Pharmacy  PCP or Specialist appointment within 3-5 days of discharge Complete Complete   HRI or Home Care Consult Complete Complete   SW Recovery Care/Counseling Consult  Complete   Palliative Care Screening Complete Not Applicable   Skilled Nursing Facility Not Applicable Not Applicable

## 2022-10-13 NOTE — Telephone Encounter (Signed)
Pharmacy Patient Advocate Encounter  Received notification from [facility] that Prior Authorization for Haloperidol Lactate 2MG /ML concentrate  has been APPROVED from 10/15/2022 to 10/12/2023   PA #/Case ID/Reference #: [id number]

## 2022-11-01 ENCOUNTER — Ambulatory Visit (HOSPITAL_BASED_OUTPATIENT_CLINIC_OR_DEPARTMENT_OTHER): Payer: Medicare Other | Admitting: Pulmonary Disease

## 2022-11-09 DEATH — deceased

## 2022-11-12 ENCOUNTER — Encounter: Payer: Medicare Other | Admitting: Cardiology
